# Patient Record
Sex: Female | Born: 1945 | Race: White | Hispanic: No | Marital: Married | Smoking: Never smoker
Health system: Southern US, Community
[De-identification: ages and names within clinical notes are randomized; demographics above are authoritative.]

## PROBLEM LIST (undated history)

## (undated) DIAGNOSIS — E079 Disorder of thyroid, unspecified: Secondary | ICD-10-CM

## (undated) DIAGNOSIS — Z87440 Personal history of urinary (tract) infections: Secondary | ICD-10-CM

## (undated) DIAGNOSIS — C801 Malignant (primary) neoplasm, unspecified: Secondary | ICD-10-CM

## (undated) DIAGNOSIS — IMO0002 Reserved for concepts with insufficient information to code with codable children: Secondary | ICD-10-CM

## (undated) DIAGNOSIS — G500 Trigeminal neuralgia: Secondary | ICD-10-CM

## (undated) DIAGNOSIS — Z9221 Personal history of antineoplastic chemotherapy: Secondary | ICD-10-CM

## (undated) DIAGNOSIS — J329 Chronic sinusitis, unspecified: Secondary | ICD-10-CM

## (undated) DIAGNOSIS — E785 Hyperlipidemia, unspecified: Secondary | ICD-10-CM

## (undated) DIAGNOSIS — C50919 Malignant neoplasm of unspecified site of unspecified female breast: Secondary | ICD-10-CM

## (undated) HISTORY — DX: Personal history of urinary (tract) infections: Z87.440

## (undated) HISTORY — DX: Chronic sinusitis, unspecified: J32.9

## (undated) HISTORY — DX: Disorder of thyroid, unspecified: E07.9

## (undated) HISTORY — DX: Trigeminal neuralgia: G50.0

## (undated) HISTORY — DX: Malignant (primary) neoplasm, unspecified: C80.1

## (undated) HISTORY — DX: Reserved for concepts with insufficient information to code with codable children: IMO0002

## (undated) HISTORY — DX: Hyperlipidemia, unspecified: E78.5

## (undated) HISTORY — DX: Malignant neoplasm of unspecified site of unspecified female breast: C50.919

---

## 1997-08-29 DIAGNOSIS — C50919 Malignant neoplasm of unspecified site of unspecified female breast: Secondary | ICD-10-CM

## 1997-08-29 HISTORY — PX: BREAST SURGERY: SHX581

## 1997-08-29 HISTORY — DX: Malignant neoplasm of unspecified site of unspecified female breast: C50.919

## 1997-08-29 HISTORY — PX: BREAST LUMPECTOMY: SHX2

## 1997-08-29 HISTORY — PX: OTHER SURGICAL HISTORY: SHX169

## 1998-01-22 ENCOUNTER — Ambulatory Visit (HOSPITAL_COMMUNITY): Admission: RE | Admit: 1998-01-22 | Discharge: 1998-01-22 | Payer: Self-pay | Admitting: General Surgery

## 1998-01-28 ENCOUNTER — Ambulatory Visit (HOSPITAL_COMMUNITY): Admission: RE | Admit: 1998-01-28 | Discharge: 1998-01-29 | Payer: Self-pay | Admitting: General Surgery

## 1998-01-30 ENCOUNTER — Encounter: Admission: RE | Admit: 1998-01-30 | Discharge: 1998-04-30 | Payer: Self-pay | Admitting: Radiation Oncology

## 1999-04-28 ENCOUNTER — Encounter (INDEPENDENT_AMBULATORY_CARE_PROVIDER_SITE_OTHER): Payer: Self-pay

## 1999-04-28 ENCOUNTER — Other Ambulatory Visit: Admission: RE | Admit: 1999-04-28 | Discharge: 1999-04-28 | Payer: Self-pay | Admitting: Obstetrics and Gynecology

## 2000-02-04 ENCOUNTER — Encounter: Admission: RE | Admit: 2000-02-04 | Discharge: 2000-02-04 | Payer: Self-pay | Admitting: Oncology

## 2000-02-04 ENCOUNTER — Encounter: Payer: Self-pay | Admitting: Oncology

## 2001-02-05 ENCOUNTER — Encounter: Payer: Self-pay | Admitting: Oncology

## 2001-02-05 ENCOUNTER — Encounter: Admission: RE | Admit: 2001-02-05 | Discharge: 2001-02-05 | Payer: Self-pay | Admitting: Oncology

## 2002-02-08 ENCOUNTER — Encounter: Admission: RE | Admit: 2002-02-08 | Discharge: 2002-02-08 | Payer: Self-pay | Admitting: Oncology

## 2002-02-08 ENCOUNTER — Encounter: Payer: Self-pay | Admitting: Oncology

## 2002-04-26 ENCOUNTER — Encounter: Payer: Self-pay | Admitting: Internal Medicine

## 2002-04-26 ENCOUNTER — Encounter: Admission: RE | Admit: 2002-04-26 | Discharge: 2002-04-26 | Payer: Self-pay | Admitting: Internal Medicine

## 2003-02-11 ENCOUNTER — Encounter: Payer: Self-pay | Admitting: Oncology

## 2003-02-11 ENCOUNTER — Encounter: Admission: RE | Admit: 2003-02-11 | Discharge: 2003-02-11 | Payer: Self-pay | Admitting: Oncology

## 2003-08-30 HISTORY — PX: BLADDER SUSPENSION: SHX72

## 2003-08-30 HISTORY — PX: ABDOMINAL HYSTERECTOMY: SHX81

## 2004-02-16 ENCOUNTER — Encounter: Admission: RE | Admit: 2004-02-16 | Discharge: 2004-02-16 | Payer: Self-pay | Admitting: Oncology

## 2004-04-21 ENCOUNTER — Encounter (INDEPENDENT_AMBULATORY_CARE_PROVIDER_SITE_OTHER): Payer: Self-pay | Admitting: *Deleted

## 2004-04-22 ENCOUNTER — Inpatient Hospital Stay (HOSPITAL_COMMUNITY): Admission: RE | Admit: 2004-04-22 | Discharge: 2004-04-24 | Payer: Self-pay | Admitting: Obstetrics and Gynecology

## 2005-02-02 ENCOUNTER — Ambulatory Visit: Payer: Self-pay | Admitting: Oncology

## 2005-02-18 ENCOUNTER — Encounter: Admission: RE | Admit: 2005-02-18 | Discharge: 2005-02-18 | Payer: Self-pay | Admitting: Internal Medicine

## 2005-03-22 ENCOUNTER — Ambulatory Visit (HOSPITAL_COMMUNITY): Admission: RE | Admit: 2005-03-22 | Discharge: 2005-03-22 | Payer: Self-pay | Admitting: Gastroenterology

## 2005-03-22 ENCOUNTER — Encounter (INDEPENDENT_AMBULATORY_CARE_PROVIDER_SITE_OTHER): Payer: Self-pay | Admitting: *Deleted

## 2005-04-05 ENCOUNTER — Ambulatory Visit: Payer: Self-pay | Admitting: Oncology

## 2005-08-29 HISTORY — PX: BREAST SURGERY: SHX581

## 2006-02-20 ENCOUNTER — Encounter: Admission: RE | Admit: 2006-02-20 | Discharge: 2006-02-20 | Payer: Self-pay | Admitting: Obstetrics and Gynecology

## 2006-03-29 DIAGNOSIS — C801 Malignant (primary) neoplasm, unspecified: Secondary | ICD-10-CM

## 2006-03-29 HISTORY — DX: Malignant (primary) neoplasm, unspecified: C80.1

## 2006-04-03 ENCOUNTER — Ambulatory Visit: Payer: Self-pay | Admitting: Oncology

## 2006-04-05 ENCOUNTER — Ambulatory Visit (HOSPITAL_COMMUNITY): Admission: RE | Admit: 2006-04-05 | Discharge: 2006-04-05 | Payer: Self-pay | Admitting: Oncology

## 2006-04-05 LAB — CBC WITH DIFFERENTIAL/PLATELET
BASO%: 0.5 % (ref 0.0–2.0)
Basophils Absolute: 0 10*3/uL (ref 0.0–0.1)
EOS%: 2.1 % (ref 0.0–7.0)
HGB: 12.9 g/dL (ref 11.6–15.9)
MCH: 30.8 pg (ref 26.0–34.0)
MCHC: 34.1 g/dL (ref 32.0–36.0)
MCV: 90.3 fL (ref 81.0–101.0)
MONO%: 5.3 % (ref 0.0–13.0)
RBC: 4.18 10*6/uL (ref 3.70–5.32)
RDW: 12.8 % (ref 11.3–14.5)
lymph#: 1.5 10*3/uL (ref 0.9–3.3)

## 2006-04-05 LAB — COMPREHENSIVE METABOLIC PANEL
ALT: 15 U/L (ref 0–40)
AST: 18 U/L (ref 0–37)
Albumin: 4.3 g/dL (ref 3.5–5.2)
Alkaline Phosphatase: 41 U/L (ref 39–117)
BUN: 12 mg/dL (ref 6–23)
Calcium: 8.8 mg/dL (ref 8.4–10.5)
Chloride: 104 mEq/L (ref 96–112)
Potassium: 4.1 mEq/L (ref 3.5–5.3)

## 2006-04-25 ENCOUNTER — Encounter: Admission: RE | Admit: 2006-04-25 | Discharge: 2006-04-25 | Payer: Self-pay | Admitting: Oncology

## 2006-05-16 ENCOUNTER — Ambulatory Visit: Payer: Self-pay | Admitting: Oncology

## 2006-05-17 ENCOUNTER — Ambulatory Visit (HOSPITAL_COMMUNITY): Admission: RE | Admit: 2006-05-17 | Discharge: 2006-05-17 | Payer: Self-pay | Admitting: Oncology

## 2006-05-17 LAB — COMPREHENSIVE METABOLIC PANEL
ALT: 18 U/L (ref 0–40)
AST: 17 U/L (ref 0–37)
Alkaline Phosphatase: 36 U/L — ABNORMAL LOW (ref 39–117)
BUN: 19 mg/dL (ref 6–23)
Creatinine, Ser: 0.73 mg/dL (ref 0.40–1.20)

## 2006-05-17 LAB — CBC WITH DIFFERENTIAL/PLATELET
BASO%: 0.2 % (ref 0.0–2.0)
Basophils Absolute: 0 10*3/uL (ref 0.0–0.1)
EOS%: 0.5 % (ref 0.0–7.0)
HCT: 40.3 % (ref 34.8–46.6)
HGB: 13.7 g/dL (ref 11.6–15.9)
MCH: 30.5 pg (ref 26.0–34.0)
MCHC: 34 g/dL (ref 32.0–36.0)
MCV: 89.9 fL (ref 81.0–101.0)
MONO%: 4.6 % (ref 0.0–13.0)
NEUT%: 61 % (ref 39.6–76.8)
RDW: 13 % (ref 11.3–14.5)

## 2006-05-23 ENCOUNTER — Encounter (INDEPENDENT_AMBULATORY_CARE_PROVIDER_SITE_OTHER): Payer: Self-pay | Admitting: Interventional Radiology

## 2006-05-23 ENCOUNTER — Encounter (INDEPENDENT_AMBULATORY_CARE_PROVIDER_SITE_OTHER): Payer: Self-pay | Admitting: *Deleted

## 2006-05-23 ENCOUNTER — Ambulatory Visit (HOSPITAL_COMMUNITY): Admission: RE | Admit: 2006-05-23 | Discharge: 2006-05-23 | Payer: Self-pay | Admitting: Oncology

## 2006-06-02 ENCOUNTER — Encounter: Admission: RE | Admit: 2006-06-02 | Discharge: 2006-06-02 | Payer: Self-pay | Admitting: Oncology

## 2006-06-02 ENCOUNTER — Encounter: Payer: Self-pay | Admitting: Family Medicine

## 2006-06-07 LAB — COMPREHENSIVE METABOLIC PANEL
ALT: 15 U/L (ref 0–40)
AST: 13 U/L (ref 0–37)
Albumin: 5.1 g/dL (ref 3.5–5.2)
Calcium: 10.7 mg/dL — ABNORMAL HIGH (ref 8.4–10.5)
Chloride: 98 mEq/L (ref 96–112)
Potassium: 4.3 mEq/L (ref 3.5–5.3)
Sodium: 139 mEq/L (ref 135–145)

## 2006-06-07 LAB — CBC WITH DIFFERENTIAL/PLATELET
BASO%: 0.2 % (ref 0.0–2.0)
EOS%: 0.7 % (ref 0.0–7.0)
HGB: 14.8 g/dL (ref 11.6–15.9)
MCH: 30.9 pg (ref 26.0–34.0)
MCHC: 34.3 g/dL (ref 32.0–36.0)
RBC: 4.78 10*6/uL (ref 3.70–5.32)
RDW: 13.4 % (ref 11.3–14.5)
lymph#: 1.8 10*3/uL (ref 0.9–3.3)

## 2006-06-15 LAB — CBC WITH DIFFERENTIAL/PLATELET
BASO%: 0.3 % (ref 0.0–2.0)
Basophils Absolute: 0 10*3/uL (ref 0.0–0.1)
HCT: 39.9 % (ref 34.8–46.6)
HGB: 13.6 g/dL (ref 11.6–15.9)
LYMPH%: 29.4 % (ref 14.0–48.0)
MCH: 30.9 pg (ref 26.0–34.0)
MCHC: 34 g/dL (ref 32.0–36.0)
MONO#: 0.1 10*3/uL (ref 0.1–0.9)
NEUT%: 65 % (ref 39.6–76.8)
Platelets: 180 10*3/uL (ref 145–400)
WBC: 4.6 10*3/uL (ref 3.9–10.0)
lymph#: 1.3 10*3/uL (ref 0.9–3.3)

## 2006-06-22 LAB — CBC WITH DIFFERENTIAL/PLATELET
BASO%: 0.8 % (ref 0.0–2.0)
Basophils Absolute: 0.1 10*3/uL (ref 0.0–0.1)
EOS%: 0.4 % (ref 0.0–7.0)
HCT: 36.1 % (ref 34.8–46.6)
HGB: 12.7 g/dL (ref 11.6–15.9)
LYMPH%: 25.4 % (ref 14.0–48.0)
MCH: 30.7 pg (ref 26.0–34.0)
MCHC: 35.1 g/dL (ref 32.0–36.0)
MCV: 87.6 fL (ref 81.0–101.0)
MONO%: 3.3 % (ref 0.0–13.0)
NEUT%: 70 % (ref 39.6–76.8)
lymph#: 1.8 10*3/uL (ref 0.9–3.3)

## 2006-06-27 ENCOUNTER — Ambulatory Visit: Payer: Self-pay | Admitting: Oncology

## 2006-06-29 LAB — CBC WITH DIFFERENTIAL/PLATELET
Basophils Absolute: 0 10*3/uL (ref 0.0–0.1)
EOS%: 0.3 % (ref 0.0–7.0)
HCT: 35.8 % (ref 34.8–46.6)
HGB: 12.2 g/dL (ref 11.6–15.9)
LYMPH%: 36.6 % (ref 14.0–48.0)
MCH: 31.3 pg (ref 26.0–34.0)
MCHC: 34 g/dL (ref 32.0–36.0)
MONO#: 0.4 10*3/uL (ref 0.1–0.9)
NEUT%: 50.8 % (ref 39.6–76.8)
Platelets: 292 10*3/uL (ref 145–400)
lymph#: 1.2 10*3/uL (ref 0.9–3.3)

## 2006-06-29 LAB — COMPREHENSIVE METABOLIC PANEL
BUN: 11 mg/dL (ref 6–23)
CO2: 31 mEq/L (ref 19–32)
Calcium: 8.8 mg/dL (ref 8.4–10.5)
Chloride: 104 mEq/L (ref 96–112)
Creatinine, Ser: 0.63 mg/dL (ref 0.40–1.20)
Total Bilirubin: 0.5 mg/dL (ref 0.3–1.2)

## 2006-07-11 LAB — CBC WITH DIFFERENTIAL/PLATELET
BASO%: 0.4 % (ref 0.0–2.0)
Basophils Absolute: 0.1 10*3/uL (ref 0.0–0.1)
Eosinophils Absolute: 0 10*3/uL (ref 0.0–0.5)
HCT: 34.4 % — ABNORMAL LOW (ref 34.8–46.6)
HGB: 12.1 g/dL (ref 11.6–15.9)
MONO#: 0.3 10*3/uL (ref 0.1–0.9)
NEUT%: 82.7 % — ABNORMAL HIGH (ref 39.6–76.8)
Platelets: 177 10*3/uL (ref 145–400)
WBC: 12.3 10*3/uL — ABNORMAL HIGH (ref 3.9–10.0)
lymph#: 1.8 10*3/uL (ref 0.9–3.3)

## 2006-07-12 LAB — URINALYSIS, MICROSCOPIC - CHCC
Bilirubin (Urine): NEGATIVE
Glucose: NEGATIVE g/dL
Ketones: NEGATIVE mg/dL
Protein: NEGATIVE mg/dL
RBC count: NEGATIVE (ref 0–2)

## 2006-07-14 ENCOUNTER — Ambulatory Visit (HOSPITAL_COMMUNITY): Admission: RE | Admit: 2006-07-14 | Discharge: 2006-07-14 | Payer: Self-pay | Admitting: Oncology

## 2006-07-14 LAB — URINE CULTURE

## 2006-07-19 LAB — CBC WITH DIFFERENTIAL/PLATELET
BASO%: 0.3 % (ref 0.0–2.0)
EOS%: 0.6 % (ref 0.0–7.0)
HCT: 33.2 % — ABNORMAL LOW (ref 34.8–46.6)
LYMPH%: 30.8 % (ref 14.0–48.0)
MCH: 32.1 pg (ref 26.0–34.0)
MCHC: 34.3 g/dL (ref 32.0–36.0)
NEUT%: 57.9 % (ref 39.6–76.8)
lymph#: 0.9 10*3/uL (ref 0.9–3.3)

## 2006-08-29 HISTORY — PX: OTHER SURGICAL HISTORY: SHX169

## 2006-09-04 ENCOUNTER — Encounter (INDEPENDENT_AMBULATORY_CARE_PROVIDER_SITE_OTHER): Payer: Self-pay | Admitting: *Deleted

## 2006-09-04 ENCOUNTER — Inpatient Hospital Stay (HOSPITAL_COMMUNITY): Admission: RE | Admit: 2006-09-04 | Discharge: 2006-09-08 | Payer: Self-pay | Admitting: Thoracic Surgery

## 2006-09-04 ENCOUNTER — Encounter: Payer: Self-pay | Admitting: Thoracic Surgery

## 2006-09-11 ENCOUNTER — Ambulatory Visit: Payer: Self-pay | Admitting: Oncology

## 2006-09-13 ENCOUNTER — Encounter: Admission: RE | Admit: 2006-09-13 | Discharge: 2006-09-13 | Payer: Self-pay | Admitting: Thoracic Surgery

## 2006-09-14 LAB — CBC WITH DIFFERENTIAL/PLATELET
BASO%: 0.6 % (ref 0.0–2.0)
EOS%: 11.8 % — ABNORMAL HIGH (ref 0.0–7.0)
LYMPH%: 36.7 % (ref 14.0–48.0)
MCH: 31.8 pg (ref 26.0–34.0)
MCHC: 33.6 g/dL (ref 32.0–36.0)
MONO#: 0.3 10*3/uL (ref 0.1–0.9)
MONO%: 6.8 % (ref 0.0–13.0)
Platelets: 390 10*3/uL (ref 145–400)
RBC: 4.1 10*6/uL (ref 3.70–5.32)
WBC: 4.5 10*3/uL (ref 3.9–10.0)

## 2006-09-14 LAB — COMPREHENSIVE METABOLIC PANEL
ALT: 24 U/L (ref 0–35)
AST: 21 U/L (ref 0–37)
Alkaline Phosphatase: 53 U/L (ref 39–117)
CO2: 27 mEq/L (ref 19–32)
Sodium: 144 mEq/L (ref 135–145)
Total Bilirubin: 0.3 mg/dL (ref 0.3–1.2)
Total Protein: 6.8 g/dL (ref 6.0–8.3)

## 2006-09-29 LAB — CBC WITH DIFFERENTIAL/PLATELET
BASO%: 0.9 % (ref 0.0–2.0)
HCT: 40.1 % (ref 34.8–46.6)
LYMPH%: 35.5 % (ref 14.0–48.0)
MCHC: 34.8 g/dL (ref 32.0–36.0)
MCV: 92.7 fL (ref 81.0–101.0)
MONO%: 7.6 % (ref 0.0–13.0)
NEUT%: 46.5 % (ref 39.6–76.8)
Platelets: 214 10*3/uL (ref 145–400)
RBC: 4.32 10*6/uL (ref 3.70–5.32)

## 2006-09-29 LAB — URINALYSIS, MICROSCOPIC - CHCC
Bilirubin (Urine): NEGATIVE
Blood: NEGATIVE
Nitrite: NEGATIVE
Specific Gravity, Urine: 1.02 (ref 1.003–1.035)
pH: 6 (ref 4.6–8.0)

## 2006-10-01 LAB — COMPREHENSIVE METABOLIC PANEL
AST: 33 U/L (ref 0–37)
Albumin: 4.7 g/dL (ref 3.5–5.2)
BUN: 15 mg/dL (ref 6–23)
Calcium: 9.1 mg/dL (ref 8.4–10.5)
Chloride: 105 mEq/L (ref 96–112)
Potassium: 5 mEq/L (ref 3.5–5.3)
Sodium: 140 mEq/L (ref 135–145)
Total Protein: 6.7 g/dL (ref 6.0–8.3)

## 2006-10-18 ENCOUNTER — Encounter: Admission: RE | Admit: 2006-10-18 | Discharge: 2006-10-18 | Payer: Self-pay | Admitting: Thoracic Surgery

## 2006-10-18 ENCOUNTER — Ambulatory Visit: Payer: Self-pay | Admitting: Thoracic Surgery

## 2006-10-20 LAB — CBC WITH DIFFERENTIAL/PLATELET
BASO%: 0.9 % (ref 0.0–2.0)
EOS%: 4.1 % (ref 0.0–7.0)
Eosinophils Absolute: 0.1 10*3/uL (ref 0.0–0.5)
LYMPH%: 46.9 % (ref 14.0–48.0)
MCH: 32 pg (ref 26.0–34.0)
MCHC: 34.2 g/dL (ref 32.0–36.0)
MCV: 93.6 fL (ref 81.0–101.0)
MONO%: 7.3 % (ref 0.0–13.0)
NEUT#: 1.4 10*3/uL — ABNORMAL LOW (ref 1.5–6.5)
Platelets: 243 10*3/uL (ref 145–400)
RBC: 4.29 10*6/uL (ref 3.70–5.32)
RDW: 14.8 % — ABNORMAL HIGH (ref 11.3–14.5)

## 2006-10-20 LAB — COMPREHENSIVE METABOLIC PANEL
ALT: 15 U/L (ref 0–35)
AST: 19 U/L (ref 0–37)
Albumin: 4.7 g/dL (ref 3.5–5.2)
Alkaline Phosphatase: 62 U/L (ref 39–117)
Potassium: 4.2 mEq/L (ref 3.5–5.3)
Sodium: 139 mEq/L (ref 135–145)
Total Bilirubin: 0.3 mg/dL (ref 0.3–1.2)
Total Protein: 6.7 g/dL (ref 6.0–8.3)

## 2006-10-24 ENCOUNTER — Ambulatory Visit: Payer: Self-pay | Admitting: Oncology

## 2006-11-03 LAB — CBC WITH DIFFERENTIAL/PLATELET
Basophils Absolute: 0 10*3/uL (ref 0.0–0.1)
EOS%: 0 % (ref 0.0–7.0)
Eosinophils Absolute: 0 10*3/uL (ref 0.0–0.5)
LYMPH%: 9 % — ABNORMAL LOW (ref 14.0–48.0)
MCH: 31.7 pg (ref 26.0–34.0)
MCV: 93.3 fL (ref 81.0–101.0)
MONO%: 5.9 % (ref 0.0–13.0)
NEUT#: 10.5 10*3/uL — ABNORMAL HIGH (ref 1.5–6.5)
Platelets: 292 10*3/uL (ref 145–400)
RBC: 4.16 10*6/uL (ref 3.70–5.32)
RDW: 12.6 % (ref 11.3–14.5)

## 2006-11-08 LAB — CBC WITH DIFFERENTIAL/PLATELET
Basophils Absolute: 0 10*3/uL (ref 0.0–0.1)
Eosinophils Absolute: 0.2 10*3/uL (ref 0.0–0.5)
HGB: 14.4 g/dL (ref 11.6–15.9)
MCV: 92.4 fL (ref 81.0–101.0)
NEUT#: 3 10*3/uL (ref 1.5–6.5)
RDW: 14.8 % — ABNORMAL HIGH (ref 11.3–14.5)
lymph#: 2.1 10*3/uL (ref 0.9–3.3)

## 2006-11-08 LAB — COMPREHENSIVE METABOLIC PANEL
Albumin: 4.2 g/dL (ref 3.5–5.2)
BUN: 15 mg/dL (ref 6–23)
CO2: 30 mEq/L (ref 19–32)
Calcium: 9.4 mg/dL (ref 8.4–10.5)
Chloride: 98 mEq/L (ref 96–112)
Glucose, Bld: 83 mg/dL (ref 70–99)
Potassium: 4.6 mEq/L (ref 3.5–5.3)

## 2006-11-10 LAB — CBC WITH DIFFERENTIAL/PLATELET
Basophils Absolute: 0 10*3/uL (ref 0.0–0.1)
EOS%: 0.6 % (ref 0.0–7.0)
Eosinophils Absolute: 0.1 10*3/uL (ref 0.0–0.5)
HGB: 12.6 g/dL (ref 11.6–15.9)
NEUT#: 6.8 10*3/uL — ABNORMAL HIGH (ref 1.5–6.5)
RDW: 14.9 % — ABNORMAL HIGH (ref 11.3–14.5)
lymph#: 2 10*3/uL (ref 0.9–3.3)

## 2006-11-13 ENCOUNTER — Encounter: Admission: RE | Admit: 2006-11-13 | Discharge: 2006-11-13 | Payer: Self-pay | Admitting: Oncology

## 2006-11-22 ENCOUNTER — Encounter: Admission: RE | Admit: 2006-11-22 | Discharge: 2006-11-22 | Payer: Self-pay | Admitting: Oncology

## 2006-11-23 LAB — CBC WITH DIFFERENTIAL/PLATELET
Basophils Absolute: 0 10*3/uL (ref 0.0–0.1)
Eosinophils Absolute: 0 10*3/uL (ref 0.0–0.5)
HGB: 12.7 g/dL (ref 11.6–15.9)
MCV: 92.5 fL (ref 81.0–101.0)
MONO#: 0.3 10*3/uL (ref 0.1–0.9)
MONO%: 7.7 % (ref 0.0–13.0)
NEUT#: 2.3 10*3/uL (ref 1.5–6.5)
Platelets: 318 10*3/uL (ref 145–400)
RDW: 16.4 % — ABNORMAL HIGH (ref 11.3–14.5)

## 2006-11-23 LAB — COMPREHENSIVE METABOLIC PANEL
Albumin: 4.6 g/dL (ref 3.5–5.2)
Alkaline Phosphatase: 76 U/L (ref 39–117)
BUN: 13 mg/dL (ref 6–23)
CO2: 28 mEq/L (ref 19–32)
Calcium: 9.6 mg/dL (ref 8.4–10.5)
Chloride: 102 mEq/L (ref 96–112)
Glucose, Bld: 82 mg/dL (ref 70–99)
Potassium: 4.2 mEq/L (ref 3.5–5.3)

## 2006-12-12 ENCOUNTER — Ambulatory Visit: Payer: Self-pay | Admitting: Oncology

## 2006-12-13 ENCOUNTER — Ambulatory Visit (HOSPITAL_COMMUNITY): Admission: RE | Admit: 2006-12-13 | Discharge: 2006-12-13 | Payer: Self-pay | Admitting: Oncology

## 2006-12-15 LAB — COMPREHENSIVE METABOLIC PANEL
ALT: 12 U/L (ref 0–35)
AST: 15 U/L (ref 0–37)
BUN: 13 mg/dL (ref 6–23)
CO2: 27 mEq/L (ref 19–32)
Creatinine, Ser: 0.65 mg/dL (ref 0.40–1.20)
Total Bilirubin: 0.5 mg/dL (ref 0.3–1.2)

## 2006-12-15 LAB — CBC WITH DIFFERENTIAL/PLATELET
BASO%: 0.7 % (ref 0.0–2.0)
Basophils Absolute: 0 10*3/uL (ref 0.0–0.1)
EOS%: 0.2 % (ref 0.0–7.0)
HCT: 34.3 % — ABNORMAL LOW (ref 34.8–46.6)
LYMPH%: 28.5 % (ref 14.0–48.0)
MCH: 32.8 pg (ref 26.0–34.0)
MCHC: 34.7 g/dL (ref 32.0–36.0)
MCV: 94.4 fL (ref 81.0–101.0)
NEUT%: 62.6 % (ref 39.6–76.8)
Platelets: 263 10*3/uL (ref 145–400)

## 2006-12-15 LAB — CANCER ANTIGEN 27.29: CA 27.29: 50 U/mL — ABNORMAL HIGH (ref 0–39)

## 2007-01-19 LAB — COMPREHENSIVE METABOLIC PANEL
ALT: 13 U/L (ref 0–35)
AST: 16 U/L (ref 0–37)
Albumin: 4.7 g/dL (ref 3.5–5.2)
Alkaline Phosphatase: 65 U/L (ref 39–117)
Potassium: 4.3 mEq/L (ref 3.5–5.3)
Sodium: 142 mEq/L (ref 135–145)
Total Bilirubin: 0.4 mg/dL (ref 0.3–1.2)
Total Protein: 6.5 g/dL (ref 6.0–8.3)

## 2007-01-19 LAB — CBC WITH DIFFERENTIAL/PLATELET
Basophils Absolute: 0 10*3/uL (ref 0.0–0.1)
EOS%: 4 % (ref 0.0–7.0)
Eosinophils Absolute: 0.1 10*3/uL (ref 0.0–0.5)
HCT: 37.7 % (ref 34.8–46.6)
HGB: 13.2 g/dL (ref 11.6–15.9)
MCH: 32.7 pg (ref 26.0–34.0)
NEUT#: 1.5 10*3/uL (ref 1.5–6.5)
NEUT%: 47.7 % (ref 39.6–76.8)
RDW: 12.8 % (ref 11.3–14.5)
lymph#: 1.3 10*3/uL (ref 0.9–3.3)

## 2007-01-30 ENCOUNTER — Ambulatory Visit (HOSPITAL_BASED_OUTPATIENT_CLINIC_OR_DEPARTMENT_OTHER): Admission: RE | Admit: 2007-01-30 | Discharge: 2007-01-30 | Payer: Self-pay | Admitting: Urology

## 2007-02-22 ENCOUNTER — Encounter: Admission: RE | Admit: 2007-02-22 | Discharge: 2007-02-22 | Payer: Self-pay | Admitting: Oncology

## 2007-02-27 ENCOUNTER — Encounter: Admission: RE | Admit: 2007-02-27 | Discharge: 2007-02-27 | Payer: Self-pay | Admitting: Oncology

## 2007-02-28 ENCOUNTER — Ambulatory Visit: Payer: Self-pay | Admitting: Oncology

## 2007-02-28 LAB — CBC WITH DIFFERENTIAL/PLATELET
BASO%: 0.9 % (ref 0.0–2.0)
HCT: 39.9 % (ref 34.8–46.6)
LYMPH%: 49.3 % — ABNORMAL HIGH (ref 14.0–48.0)
MCH: 31.5 pg (ref 26.0–34.0)
MCHC: 35.3 g/dL (ref 32.0–36.0)
MCV: 89.1 fL (ref 81.0–101.0)
MONO#: 0.2 10*3/uL (ref 0.1–0.9)
MONO%: 5.7 % (ref 0.0–13.0)
NEUT%: 42.2 % (ref 39.6–76.8)
Platelets: 214 10*3/uL (ref 145–400)
WBC: 3.3 10*3/uL — ABNORMAL LOW (ref 3.9–10.0)

## 2007-04-04 ENCOUNTER — Ambulatory Visit: Payer: Self-pay | Admitting: Oncology

## 2007-04-04 LAB — CBC WITH DIFFERENTIAL/PLATELET
BASO%: 0.4 % (ref 0.0–2.0)
EOS%: 2.1 % (ref 0.0–7.0)
HCT: 39 % (ref 34.8–46.6)
LYMPH%: 42.8 % (ref 14.0–48.0)
MCH: 31.9 pg (ref 26.0–34.0)
MCHC: 35.3 g/dL (ref 32.0–36.0)
NEUT%: 47 % (ref 39.6–76.8)
RBC: 4.32 10*6/uL (ref 3.70–5.32)
lymph#: 1.3 10*3/uL (ref 0.9–3.3)

## 2007-04-04 LAB — COMPREHENSIVE METABOLIC PANEL
ALT: 15 U/L (ref 0–35)
AST: 20 U/L (ref 0–37)
Chloride: 105 mEq/L (ref 96–112)
Creatinine, Ser: 0.66 mg/dL (ref 0.40–1.20)
Total Bilirubin: 0.4 mg/dL (ref 0.3–1.2)

## 2007-04-04 LAB — CANCER ANTIGEN 27.29: CA 27.29: 22 U/mL (ref 0–39)

## 2007-06-22 ENCOUNTER — Ambulatory Visit: Payer: Self-pay | Admitting: Oncology

## 2007-06-26 LAB — CBC WITH DIFFERENTIAL/PLATELET
BASO%: 0.4 % (ref 0.0–2.0)
EOS%: 1.8 % (ref 0.0–7.0)
HGB: 13.5 g/dL (ref 11.6–15.9)
LYMPH%: 40.2 % (ref 14.0–48.0)
MCV: 91.8 fL (ref 81.0–101.0)
NEUT#: 1.9 10*3/uL (ref 1.5–6.5)
NEUT%: 50.9 % (ref 39.6–76.8)
RBC: 4.24 10*6/uL (ref 3.70–5.32)
RDW: 12.3 % (ref 11.3–14.5)
WBC: 3.7 10*3/uL — ABNORMAL LOW (ref 3.9–10.0)
lymph#: 1.5 10*3/uL (ref 0.9–3.3)

## 2007-06-26 LAB — CANCER ANTIGEN 27.29: CA 27.29: 27 U/mL (ref 0–39)

## 2007-06-26 LAB — COMPREHENSIVE METABOLIC PANEL
ALT: 11 U/L (ref 0–35)
CO2: 27 mEq/L (ref 19–32)
Creatinine, Ser: 0.69 mg/dL (ref 0.40–1.20)
Total Bilirubin: 0.3 mg/dL (ref 0.3–1.2)

## 2007-06-27 ENCOUNTER — Ambulatory Visit (HOSPITAL_COMMUNITY): Admission: RE | Admit: 2007-06-27 | Discharge: 2007-06-27 | Payer: Self-pay | Admitting: Oncology

## 2007-09-05 LAB — CONVERTED CEMR LAB
Cholesterol: 305 mg/dL
Triglycerides: 263 mg/dL

## 2007-10-29 ENCOUNTER — Ambulatory Visit: Payer: Self-pay | Admitting: Oncology

## 2007-10-31 LAB — CBC WITH DIFFERENTIAL/PLATELET
BASO%: 0.6 % (ref 0.0–2.0)
Basophils Absolute: 0 10*3/uL (ref 0.0–0.1)
EOS%: 2.9 % (ref 0.0–7.0)
HCT: 36.8 % (ref 34.8–46.6)
HGB: 12.6 g/dL (ref 11.6–15.9)
LYMPH%: 44.3 % (ref 14.0–48.0)
MCH: 31.5 pg (ref 26.0–34.0)
MCHC: 34.3 g/dL (ref 32.0–36.0)
MCV: 91.6 fL (ref 81.0–101.0)
MONO%: 6.6 % (ref 0.0–13.0)
NEUT%: 45.6 % (ref 39.6–76.8)
Platelets: 202 10*3/uL (ref 145–400)
lymph#: 1.4 10*3/uL (ref 0.9–3.3)

## 2007-10-31 LAB — COMPREHENSIVE METABOLIC PANEL
ALT: 10 U/L (ref 0–35)
AST: 15 U/L (ref 0–37)
BUN: 19 mg/dL (ref 6–23)
Calcium: 9.5 mg/dL (ref 8.4–10.5)
Chloride: 103 mEq/L (ref 96–112)
Creatinine, Ser: 0.67 mg/dL (ref 0.40–1.20)
Total Bilirubin: 0.4 mg/dL (ref 0.3–1.2)

## 2007-12-20 ENCOUNTER — Ambulatory Visit: Payer: Self-pay | Admitting: Family Medicine

## 2007-12-20 DIAGNOSIS — G500 Trigeminal neuralgia: Secondary | ICD-10-CM | POA: Insufficient documentation

## 2007-12-20 DIAGNOSIS — M810 Age-related osteoporosis without current pathological fracture: Secondary | ICD-10-CM | POA: Insufficient documentation

## 2007-12-20 DIAGNOSIS — E782 Mixed hyperlipidemia: Secondary | ICD-10-CM | POA: Insufficient documentation

## 2007-12-20 DIAGNOSIS — H409 Unspecified glaucoma: Secondary | ICD-10-CM | POA: Insufficient documentation

## 2007-12-20 DIAGNOSIS — M5136 Other intervertebral disc degeneration, lumbar region: Secondary | ICD-10-CM | POA: Insufficient documentation

## 2007-12-21 ENCOUNTER — Encounter: Payer: Self-pay | Admitting: Family Medicine

## 2007-12-23 LAB — CONVERTED CEMR LAB
Cholesterol: 259 mg/dL — ABNORMAL HIGH (ref 0–200)
Total CHOL/HDL Ratio: 3.7

## 2008-01-18 ENCOUNTER — Ambulatory Visit: Payer: Self-pay | Admitting: Oncology

## 2008-01-23 ENCOUNTER — Encounter: Payer: Self-pay | Admitting: Family Medicine

## 2008-02-25 ENCOUNTER — Encounter: Admission: RE | Admit: 2008-02-25 | Discharge: 2008-02-25 | Payer: Self-pay | Admitting: Oncology

## 2008-02-25 ENCOUNTER — Encounter: Payer: Self-pay | Admitting: Family Medicine

## 2008-03-12 ENCOUNTER — Ambulatory Visit: Payer: Self-pay | Admitting: Family Medicine

## 2008-03-13 LAB — CONVERTED CEMR LAB
Albumin: 4.9 g/dL (ref 3.5–5.2)
Indirect Bilirubin: 0.3 mg/dL (ref 0.0–0.9)
LDL Cholesterol: 113 mg/dL — ABNORMAL HIGH (ref 0–99)
Total Protein: 6.9 g/dL (ref 6.0–8.3)
Triglycerides: 209 mg/dL — ABNORMAL HIGH (ref <150)
VLDL: 42 mg/dL — ABNORMAL HIGH (ref 0–40)

## 2008-03-14 ENCOUNTER — Telehealth: Payer: Self-pay | Admitting: Family Medicine

## 2008-04-04 ENCOUNTER — Ambulatory Visit: Payer: Self-pay | Admitting: Family Medicine

## 2008-04-07 ENCOUNTER — Telehealth: Payer: Self-pay | Admitting: Family Medicine

## 2008-04-08 ENCOUNTER — Encounter: Admission: RE | Admit: 2008-04-08 | Discharge: 2008-04-08 | Payer: Self-pay | Admitting: Family Medicine

## 2008-04-13 ENCOUNTER — Ambulatory Visit: Payer: Self-pay | Admitting: Oncology

## 2008-04-13 IMAGING — MG MM SCREEN MAMMOGRAM BILATERAL
5 series · 5 of 5 positions shown · non-contrast
Comparison: none

DG SCREEN MAMMOGRAM BILATERAL
Bilateral CC and MLO view(s) were taken.

SCREENING MAMMOGRAM:
There is a fibroglandular pattern.  No masses or malignant type calcifications are identified.  
Lumpectomy changes are noted on the left.  Compared with prior studies.

[R CC]
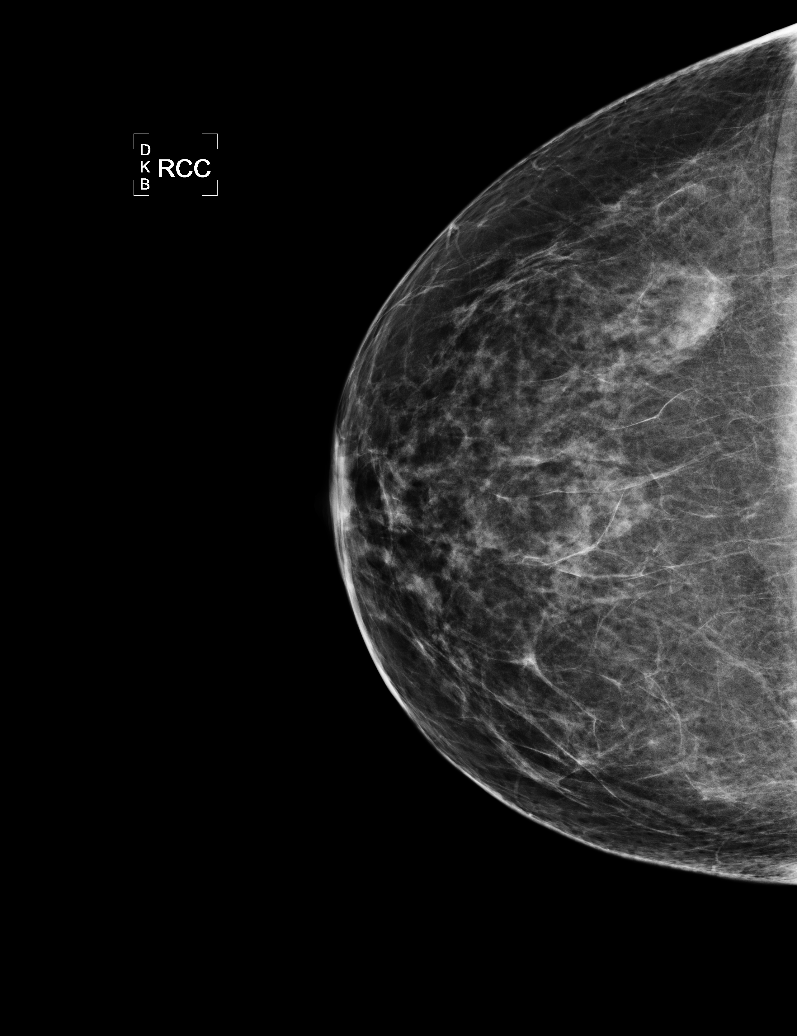

[L CC]
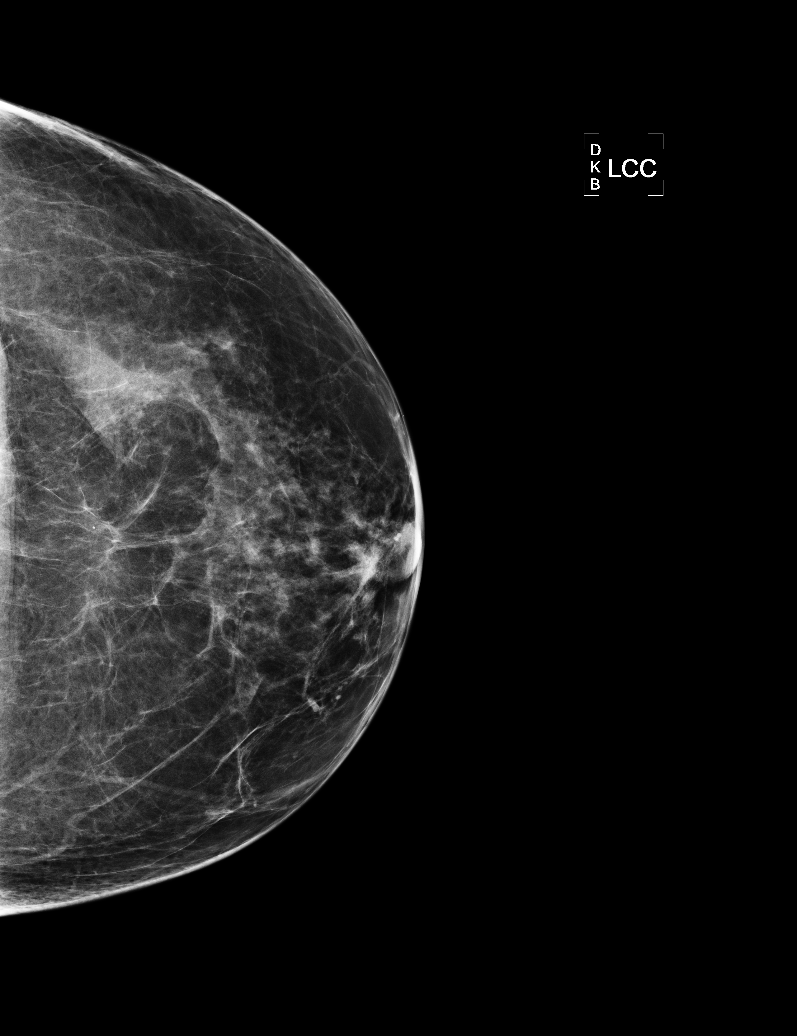

[L MLO (1 of 2)]
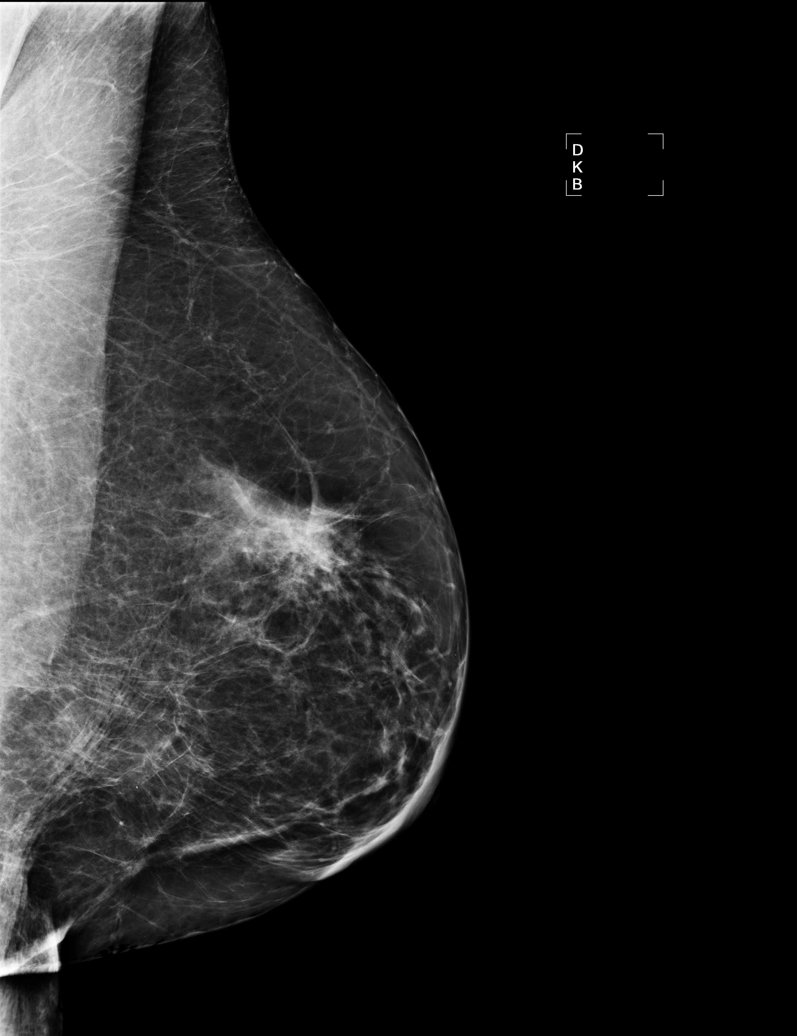

[R MLO]
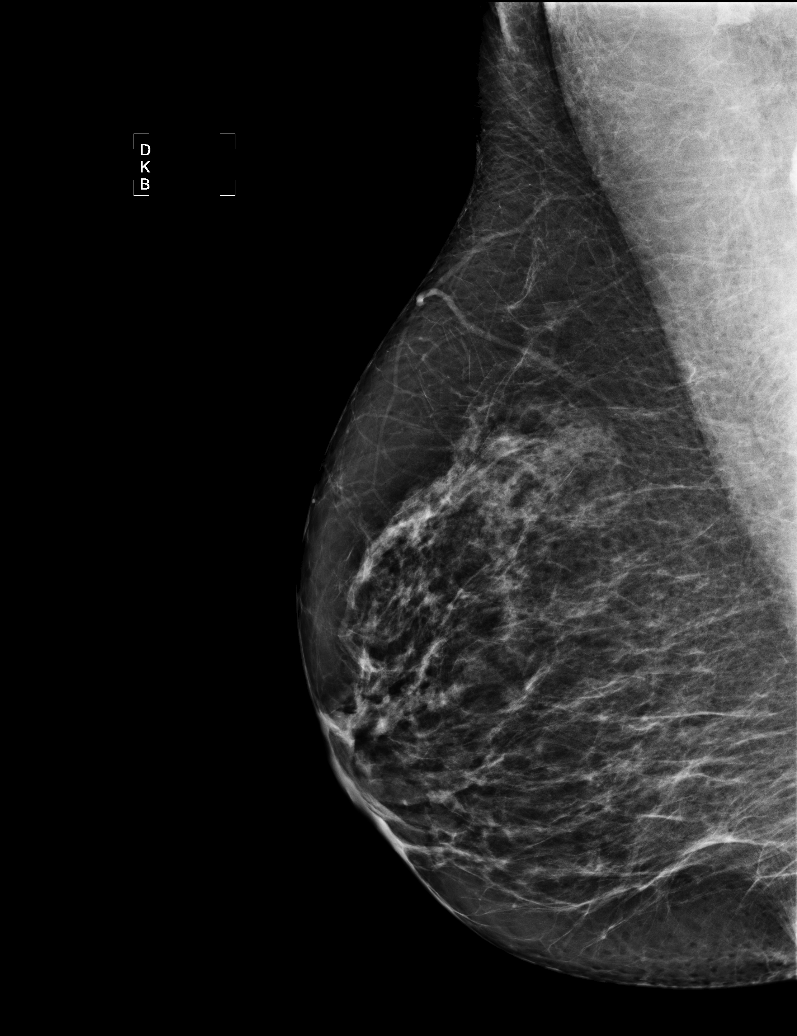

[L MLO (2 of 2)]
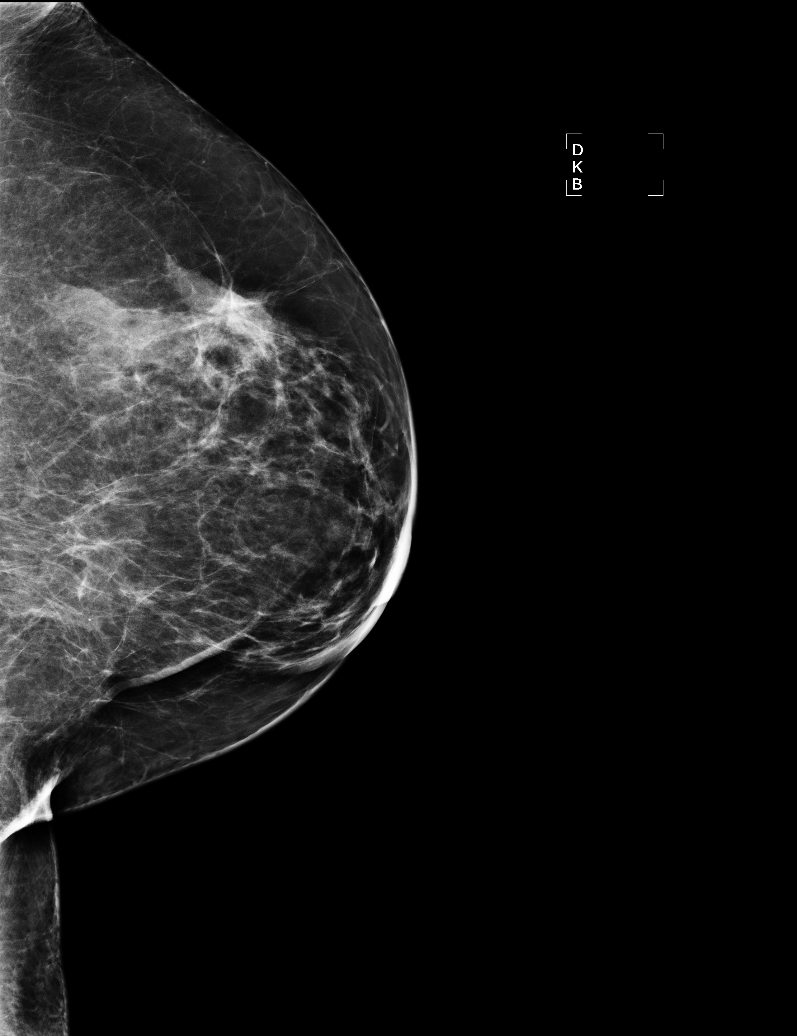

[5 of 5 positions shown; findings below may reference images not displayed]

IMPRESSION: No specific mammographic evidence of malignancy. Consider screening MRI every 1-2 years, given the 
patient's high risk status for breast cancer.  Next screening mammogram is recommended in one year.

ASSESSMENT: Negative - BI-RADS 1

Screening mammogram in 1 year.
ANALYZED BY COMPUTER AIDED DETECTION. , THIS PROCEDURE WAS A DIGITAL MAMMOGRAM.

## 2008-04-18 ENCOUNTER — Encounter: Payer: Self-pay | Admitting: Family Medicine

## 2008-04-18 LAB — CBC WITH DIFFERENTIAL/PLATELET
BASO%: 0.4 % (ref 0.0–2.0)
EOS%: 2.6 % (ref 0.0–7.0)
LYMPH%: 39.1 % (ref 14.0–48.0)
MCH: 32.4 pg (ref 26.0–34.0)
MCHC: 34.5 g/dL (ref 32.0–36.0)
MONO#: 0.2 10*3/uL (ref 0.1–0.9)
NEUT%: 52.8 % (ref 39.6–76.8)
Platelets: 193 10*3/uL (ref 145–400)
RBC: 4.09 10*6/uL (ref 3.70–5.32)
WBC: 3.7 10*3/uL — ABNORMAL LOW (ref 3.9–10.0)
lymph#: 1.4 10*3/uL (ref 0.9–3.3)

## 2008-04-18 LAB — COMPREHENSIVE METABOLIC PANEL
ALT: 11 U/L (ref 0–35)
AST: 18 U/L (ref 0–37)
CO2: 28 mEq/L (ref 19–32)
Creatinine, Ser: 0.87 mg/dL (ref 0.40–1.20)
Sodium: 141 mEq/L (ref 135–145)
Total Bilirubin: 0.4 mg/dL (ref 0.3–1.2)
Total Protein: 6.7 g/dL (ref 6.0–8.3)

## 2008-04-18 LAB — CANCER ANTIGEN 27.29: CA 27.29: 21 U/mL (ref 0–39)

## 2008-04-22 ENCOUNTER — Telehealth: Payer: Self-pay | Admitting: Family Medicine

## 2008-04-24 ENCOUNTER — Encounter: Payer: Self-pay | Admitting: Family Medicine

## 2008-04-25 ENCOUNTER — Encounter: Payer: Self-pay | Admitting: Family Medicine

## 2008-04-28 LAB — CONVERTED CEMR LAB
ALT: 11 units/L (ref 0–35)
AST: 17 units/L (ref 0–37)
Albumin: 4.9 g/dL (ref 3.5–5.2)
Alkaline Phosphatase: 31 units/L — ABNORMAL LOW (ref 39–117)
HDL: 74 mg/dL (ref >39)
LDL Cholesterol: 92 mg/dL (ref 0–99)
Total Protein: 6.9 g/dL (ref 6.0–8.3)
Triglycerides: 151 mg/dL — ABNORMAL HIGH (ref <150)

## 2008-06-10 ENCOUNTER — Ambulatory Visit (HOSPITAL_COMMUNITY): Admission: RE | Admit: 2008-06-10 | Discharge: 2008-06-10 | Payer: Self-pay | Admitting: Oncology

## 2008-06-23 ENCOUNTER — Ambulatory Visit: Payer: Self-pay | Admitting: Oncology

## 2008-06-25 ENCOUNTER — Encounter: Payer: Self-pay | Admitting: Family Medicine

## 2008-06-25 LAB — COMPREHENSIVE METABOLIC PANEL
ALT: 14 U/L (ref 0–35)
AST: 20 U/L (ref 0–37)
CO2: 30 mEq/L (ref 19–32)
Calcium: 10 mg/dL (ref 8.4–10.5)
Chloride: 102 mEq/L (ref 96–112)
Sodium: 142 mEq/L (ref 135–145)
Total Protein: 6.8 g/dL (ref 6.0–8.3)

## 2008-06-25 LAB — CBC WITH DIFFERENTIAL/PLATELET
BASO%: 0.4 % (ref 0.0–2.0)
EOS%: 2.4 % (ref 0.0–7.0)
MCHC: 34 g/dL (ref 32.0–36.0)
MONO#: 0.3 10*3/uL (ref 0.1–0.9)
RBC: 4.19 10*6/uL (ref 3.70–5.32)
RDW: 13 % (ref 11.3–14.5)
WBC: 3.5 10*3/uL — ABNORMAL LOW (ref 3.9–10.0)
lymph#: 1.4 10*3/uL (ref 0.9–3.3)

## 2008-09-09 ENCOUNTER — Ambulatory Visit: Payer: Self-pay | Admitting: Family Medicine

## 2008-09-23 ENCOUNTER — Encounter: Payer: Self-pay | Admitting: Family Medicine

## 2008-09-26 ENCOUNTER — Ambulatory Visit: Payer: Self-pay | Admitting: Family Medicine

## 2008-09-26 DIAGNOSIS — J309 Allergic rhinitis, unspecified: Secondary | ICD-10-CM | POA: Insufficient documentation

## 2008-10-09 ENCOUNTER — Encounter: Payer: Self-pay | Admitting: Family Medicine

## 2008-10-14 ENCOUNTER — Ambulatory Visit: Payer: Self-pay | Admitting: Family Medicine

## 2008-10-15 ENCOUNTER — Telehealth: Payer: Self-pay | Admitting: Family Medicine

## 2008-10-15 ENCOUNTER — Encounter: Payer: Self-pay | Admitting: Family Medicine

## 2008-10-20 ENCOUNTER — Telehealth: Payer: Self-pay | Admitting: Family Medicine

## 2008-10-27 ENCOUNTER — Ambulatory Visit: Payer: Self-pay | Admitting: Oncology

## 2008-10-27 ENCOUNTER — Ambulatory Visit: Payer: Self-pay | Admitting: Family Medicine

## 2008-10-27 LAB — CONVERTED CEMR LAB
Bilirubin Urine: NEGATIVE
Blood in Urine, dipstick: NEGATIVE
Glucose, Urine, Semiquant: NEGATIVE
Ketones, urine, test strip: NEGATIVE
Nitrite: NEGATIVE
Protein, U semiquant: NEGATIVE
Specific Gravity, Urine: 1.01
Urobilinogen, UA: 0.2
pH: 7

## 2008-10-28 ENCOUNTER — Encounter: Payer: Self-pay | Admitting: Family Medicine

## 2008-10-28 ENCOUNTER — Telehealth: Payer: Self-pay | Admitting: Family Medicine

## 2008-10-28 LAB — CONVERTED CEMR LAB
Clue Cells Wet Prep HPF POC: NONE SEEN
Yeast Wet Prep HPF POC: NONE SEEN

## 2008-10-29 ENCOUNTER — Encounter: Payer: Self-pay | Admitting: Family Medicine

## 2008-10-29 ENCOUNTER — Encounter: Admission: RE | Admit: 2008-10-29 | Discharge: 2008-10-29 | Payer: Self-pay | Admitting: Family Medicine

## 2008-10-29 LAB — CBC WITH DIFFERENTIAL/PLATELET
BASO%: 0.7 % (ref 0.0–2.0)
HCT: 39.7 % (ref 34.8–46.6)
HGB: 13.7 g/dL (ref 11.6–15.9)
MCHC: 34.6 g/dL (ref 31.5–36.0)
MONO#: 0.3 10*3/uL (ref 0.1–0.9)
NEUT%: 42.2 % (ref 38.4–76.8)
WBC: 4.3 10*3/uL (ref 3.9–10.3)
lymph#: 2.1 10*3/uL (ref 0.9–3.3)

## 2008-10-29 LAB — COMPREHENSIVE METABOLIC PANEL
ALT: 13 U/L (ref 0–35)
Albumin: 5 g/dL (ref 3.5–5.2)
CO2: 30 mEq/L (ref 19–32)
Calcium: 9.5 mg/dL (ref 8.4–10.5)
Chloride: 103 mEq/L (ref 96–112)
Creatinine, Ser: 0.69 mg/dL (ref 0.40–1.20)
Potassium: 3.8 mEq/L (ref 3.5–5.3)
Sodium: 142 mEq/L (ref 135–145)
Total Protein: 6.8 g/dL (ref 6.0–8.3)

## 2008-10-30 ENCOUNTER — Ambulatory Visit: Payer: Self-pay | Admitting: Family Medicine

## 2008-11-05 ENCOUNTER — Encounter: Payer: Self-pay | Admitting: Family Medicine

## 2008-11-26 ENCOUNTER — Telehealth: Payer: Self-pay | Admitting: Family Medicine

## 2008-11-27 ENCOUNTER — Encounter: Payer: Self-pay | Admitting: Family Medicine

## 2008-12-01 LAB — CONVERTED CEMR LAB
ALT: 14 units/L (ref 0–35)
AST: 18 units/L (ref 0–37)
Alkaline Phosphatase: 30 units/L — ABNORMAL LOW (ref 39–117)
Bilirubin, Direct: 0.1 mg/dL (ref 0.0–0.3)
Cholesterol: 210 mg/dL — ABNORMAL HIGH (ref 0–200)
HDL: 96 mg/dL (ref >39)
Total CHOL/HDL Ratio: 2.2

## 2009-02-25 ENCOUNTER — Encounter: Admission: RE | Admit: 2009-02-25 | Discharge: 2009-02-25 | Payer: Self-pay | Admitting: Oncology

## 2009-03-03 ENCOUNTER — Encounter: Payer: Self-pay | Admitting: Family Medicine

## 2009-03-12 ENCOUNTER — Ambulatory Visit: Payer: Self-pay | Admitting: Family Medicine

## 2009-03-13 ENCOUNTER — Encounter: Payer: Self-pay | Admitting: Family Medicine

## 2009-03-15 LAB — CONVERTED CEMR LAB
Cholesterol: 187 mg/dL (ref 0–200)
VLDL: 26 mg/dL (ref 0–40)
Vit D, 25-Hydroxy: 34 ng/mL (ref 30–89)

## 2009-03-20 ENCOUNTER — Ambulatory Visit: Payer: Self-pay | Admitting: Oncology

## 2009-03-25 ENCOUNTER — Encounter: Payer: Self-pay | Admitting: Family Medicine

## 2009-03-25 LAB — CBC WITH DIFFERENTIAL/PLATELET
HGB: 12.8 g/dL (ref 11.6–15.9)
MCH: 31.6 pg (ref 25.1–34.0)
MCHC: 34 g/dL (ref 31.5–36.0)
MCV: 93.2 fL (ref 79.5–101.0)
MONO%: 8.3 % (ref 0.0–14.0)
RBC: 4.03 10*6/uL (ref 3.70–5.45)
RDW: 12.5 % (ref 11.2–14.5)
lymph#: 1.4 10*3/uL (ref 0.9–3.3)

## 2009-03-25 LAB — COMPREHENSIVE METABOLIC PANEL
CO2: 29 mEq/L (ref 19–32)
Creatinine, Ser: 0.66 mg/dL (ref 0.40–1.20)
Glucose, Bld: 87 mg/dL (ref 70–99)
Total Bilirubin: 0.4 mg/dL (ref 0.3–1.2)
Total Protein: 6.2 g/dL (ref 6.0–8.3)

## 2009-03-25 LAB — CANCER ANTIGEN 27.29: CA 27.29: 20 U/mL (ref 0–39)

## 2009-04-15 ENCOUNTER — Encounter: Payer: Self-pay | Admitting: Family Medicine

## 2009-04-16 LAB — CONVERTED CEMR LAB: Vit D, 25-Hydroxy: 31 ng/mL (ref 30–89)

## 2009-05-21 ENCOUNTER — Ambulatory Visit: Payer: Self-pay | Admitting: Family Medicine

## 2009-05-21 DIAGNOSIS — M542 Cervicalgia: Secondary | ICD-10-CM | POA: Insufficient documentation

## 2009-07-13 ENCOUNTER — Ambulatory Visit: Payer: Self-pay | Admitting: Family Medicine

## 2009-07-14 ENCOUNTER — Encounter: Payer: Self-pay | Admitting: Family Medicine

## 2009-07-14 LAB — CONVERTED CEMR LAB
ALT: 14 units/L (ref 0–35)
AST: 21 units/L (ref 0–37)
Alkaline Phosphatase: 37 units/L — ABNORMAL LOW (ref 39–117)
CO2: 28 meq/L (ref 19–32)
Creatinine, Ser: 0.66 mg/dL (ref 0.40–1.20)
Sodium: 143 meq/L (ref 135–145)
Total Bilirubin: 0.4 mg/dL (ref 0.3–1.2)
Total Protein: 7.3 g/dL (ref 6.0–8.3)
Vit D, 25-Hydroxy: 35 ng/mL (ref 30–89)

## 2009-07-21 ENCOUNTER — Ambulatory Visit: Payer: Self-pay | Admitting: Oncology

## 2009-07-22 ENCOUNTER — Telehealth: Payer: Self-pay | Admitting: Family Medicine

## 2009-07-24 ENCOUNTER — Ambulatory Visit: Payer: Self-pay | Admitting: Family Medicine

## 2009-07-24 DIAGNOSIS — R42 Dizziness and giddiness: Secondary | ICD-10-CM | POA: Insufficient documentation

## 2009-07-27 ENCOUNTER — Encounter: Payer: Self-pay | Admitting: Family Medicine

## 2009-07-27 LAB — CBC WITH DIFFERENTIAL/PLATELET
Basophils Absolute: 0 10*3/uL (ref 0.0–0.1)
EOS%: 0 % (ref 0.0–7.0)
HGB: 14 g/dL (ref 11.6–15.9)
MCH: 32.1 pg (ref 25.1–34.0)
NEUT#: 5.7 10*3/uL (ref 1.5–6.5)
RBC: 4.36 10*6/uL (ref 3.70–5.45)
RDW: 12.8 % (ref 11.2–14.5)
lymph#: 1.5 10*3/uL (ref 0.9–3.3)

## 2009-07-27 LAB — COMPREHENSIVE METABOLIC PANEL
ALT: 12 U/L (ref 0–35)
AST: 15 U/L (ref 0–37)
Albumin: 5.2 g/dL (ref 3.5–5.2)
Alkaline Phosphatase: 36 U/L — ABNORMAL LOW (ref 39–117)
BUN: 20 mg/dL (ref 6–23)
Calcium: 10.4 mg/dL (ref 8.4–10.5)
Chloride: 100 mEq/L (ref 96–112)
Potassium: 3.6 mEq/L (ref 3.5–5.3)
Sodium: 140 mEq/L (ref 135–145)
Total Protein: 7.1 g/dL (ref 6.0–8.3)

## 2009-09-04 ENCOUNTER — Encounter: Payer: Self-pay | Admitting: Family Medicine

## 2009-09-15 ENCOUNTER — Encounter: Payer: Self-pay | Admitting: Family Medicine

## 2009-09-22 ENCOUNTER — Telehealth: Payer: Self-pay | Admitting: Family Medicine

## 2009-09-30 ENCOUNTER — Ambulatory Visit: Payer: Self-pay | Admitting: Family Medicine

## 2009-10-02 ENCOUNTER — Encounter: Payer: Self-pay | Admitting: Family Medicine

## 2009-10-07 ENCOUNTER — Telehealth (INDEPENDENT_AMBULATORY_CARE_PROVIDER_SITE_OTHER): Payer: Self-pay | Admitting: *Deleted

## 2009-10-07 ENCOUNTER — Ambulatory Visit: Payer: Self-pay | Admitting: Family Medicine

## 2009-10-07 DIAGNOSIS — K219 Gastro-esophageal reflux disease without esophagitis: Secondary | ICD-10-CM | POA: Insufficient documentation

## 2009-10-22 ENCOUNTER — Ambulatory Visit: Payer: Self-pay | Admitting: Family Medicine

## 2009-10-26 ENCOUNTER — Telehealth: Payer: Self-pay | Admitting: Family Medicine

## 2009-11-10 ENCOUNTER — Encounter: Admission: RE | Admit: 2009-11-10 | Discharge: 2009-11-10 | Payer: Self-pay | Admitting: Otolaryngology

## 2009-11-19 ENCOUNTER — Ambulatory Visit: Payer: Self-pay | Admitting: Oncology

## 2009-11-23 ENCOUNTER — Encounter: Payer: Self-pay | Admitting: Family Medicine

## 2009-11-23 LAB — CBC WITH DIFFERENTIAL/PLATELET
BASO%: 0.4 % (ref 0.0–2.0)
EOS%: 1.3 % (ref 0.0–7.0)
MCH: 32.3 pg (ref 25.1–34.0)
MCHC: 34.2 g/dL (ref 31.5–36.0)
MONO#: 0.2 10*3/uL (ref 0.1–0.9)
NEUT%: 53.1 % (ref 38.4–76.8)
RBC: 4.27 10*6/uL (ref 3.70–5.45)
RDW: 12.8 % (ref 11.2–14.5)
WBC: 3.2 10*3/uL — ABNORMAL LOW (ref 3.9–10.3)
lymph#: 1.2 10*3/uL (ref 0.9–3.3)

## 2009-11-23 LAB — COMPREHENSIVE METABOLIC PANEL
ALT: 15 U/L (ref 0–35)
AST: 21 U/L (ref 0–37)
Calcium: 9.5 mg/dL (ref 8.4–10.5)
Chloride: 102 mEq/L (ref 96–112)
Creatinine, Ser: 0.74 mg/dL (ref 0.40–1.20)
Potassium: 3.8 mEq/L (ref 3.5–5.3)
Sodium: 143 mEq/L (ref 135–145)
Total Protein: 6.8 g/dL (ref 6.0–8.3)

## 2009-11-24 ENCOUNTER — Telehealth: Payer: Self-pay | Admitting: Family Medicine

## 2009-11-26 ENCOUNTER — Telehealth (INDEPENDENT_AMBULATORY_CARE_PROVIDER_SITE_OTHER): Payer: Self-pay | Admitting: *Deleted

## 2009-11-27 ENCOUNTER — Ambulatory Visit: Payer: Self-pay | Admitting: Family Medicine

## 2009-11-27 DIAGNOSIS — H1045 Other chronic allergic conjunctivitis: Secondary | ICD-10-CM | POA: Insufficient documentation

## 2009-11-29 LAB — CONVERTED CEMR LAB
BUN: 15 mg/dL (ref 6–23)
CO2: 29 meq/L (ref 19–32)
Calcium: 10.4 mg/dL (ref 8.4–10.5)
Chloride: 100 meq/L (ref 96–112)
Cholesterol: 235 mg/dL — ABNORMAL HIGH (ref 0–200)
Creatinine, Ser: 0.62 mg/dL (ref 0.40–1.20)
Glucose, Bld: 97 mg/dL (ref 70–99)
HDL: 71 mg/dL (ref >39)
Total Bilirubin: 0.5 mg/dL (ref 0.3–1.2)
Total CHOL/HDL Ratio: 3.3
Triglycerides: 162 mg/dL — ABNORMAL HIGH (ref <150)
VLDL: 32 mg/dL (ref 0–40)

## 2009-12-01 ENCOUNTER — Telehealth: Payer: Self-pay | Admitting: Family Medicine

## 2009-12-24 ENCOUNTER — Ambulatory Visit: Payer: Self-pay | Admitting: Family Medicine

## 2009-12-28 ENCOUNTER — Encounter: Payer: Self-pay | Admitting: Family Medicine

## 2010-01-26 ENCOUNTER — Telehealth: Payer: Self-pay | Admitting: Family Medicine

## 2010-02-08 ENCOUNTER — Encounter: Payer: Self-pay | Admitting: Family Medicine

## 2010-02-08 LAB — HM COLONOSCOPY: HM Colonoscopy: ABNORMAL

## 2010-02-26 ENCOUNTER — Encounter: Admission: RE | Admit: 2010-02-26 | Discharge: 2010-02-26 | Payer: Self-pay | Admitting: Oncology

## 2010-03-01 ENCOUNTER — Ambulatory Visit: Payer: Self-pay | Admitting: Family Medicine

## 2010-03-01 DIAGNOSIS — E785 Hyperlipidemia, unspecified: Secondary | ICD-10-CM | POA: Insufficient documentation

## 2010-03-01 LAB — CONVERTED CEMR LAB
Ketones, urine, test strip: NEGATIVE
Protein, U semiquant: NEGATIVE
Specific Gravity, Urine: 1.01
Urobilinogen, UA: 0.2

## 2010-03-02 ENCOUNTER — Telehealth: Payer: Self-pay | Admitting: Family Medicine

## 2010-03-02 ENCOUNTER — Encounter: Payer: Self-pay | Admitting: Family Medicine

## 2010-03-04 ENCOUNTER — Telehealth (INDEPENDENT_AMBULATORY_CARE_PROVIDER_SITE_OTHER): Payer: Self-pay | Admitting: *Deleted

## 2010-03-04 ENCOUNTER — Encounter: Payer: Self-pay | Admitting: Family Medicine

## 2010-03-04 ENCOUNTER — Telehealth: Payer: Self-pay | Admitting: Family Medicine

## 2010-03-17 ENCOUNTER — Encounter: Payer: Self-pay | Admitting: Family Medicine

## 2010-03-18 ENCOUNTER — Ambulatory Visit: Payer: Self-pay | Admitting: Oncology

## 2010-03-22 ENCOUNTER — Encounter: Payer: Self-pay | Admitting: Family Medicine

## 2010-03-22 LAB — CBC WITH DIFFERENTIAL/PLATELET
Eosinophils Absolute: 0.1 10*3/uL (ref 0.0–0.5)
HCT: 38.2 % (ref 34.8–46.6)
LYMPH%: 44.6 % (ref 14.0–49.7)
MCHC: 34.5 g/dL (ref 31.5–36.0)
MCV: 93.2 fL (ref 79.5–101.0)
MONO#: 0.2 10*3/uL (ref 0.1–0.9)
MONO%: 7.7 % (ref 0.0–14.0)
NEUT#: 1.4 10*3/uL — ABNORMAL LOW (ref 1.5–6.5)
NEUT%: 45.5 % (ref 38.4–76.8)
Platelets: 206 10*3/uL (ref 145–400)
RBC: 4.09 10*6/uL (ref 3.70–5.45)
WBC: 3 10*3/uL — ABNORMAL LOW (ref 3.9–10.3)

## 2010-03-22 LAB — COMPREHENSIVE METABOLIC PANEL
ALT: 14 U/L (ref 0–35)
AST: 19 U/L (ref 0–37)
Albumin: 4.8 g/dL (ref 3.5–5.2)
CO2: 30 mEq/L (ref 19–32)
Calcium: 9.3 mg/dL (ref 8.4–10.5)
Chloride: 104 mEq/L (ref 96–112)
Potassium: 4.2 mEq/L (ref 3.5–5.3)
Total Protein: 6.4 g/dL (ref 6.0–8.3)

## 2010-03-22 LAB — MORPHOLOGY
PLT EST: ADEQUATE
Platelet Morphology: NORMAL

## 2010-04-23 ENCOUNTER — Ambulatory Visit: Payer: Self-pay | Admitting: Emergency Medicine

## 2010-07-12 ENCOUNTER — Ambulatory Visit: Payer: Self-pay | Admitting: Family Medicine

## 2010-07-12 DIAGNOSIS — N644 Mastodynia: Secondary | ICD-10-CM | POA: Insufficient documentation

## 2010-07-14 ENCOUNTER — Ambulatory Visit: Payer: Self-pay | Admitting: Oncology

## 2010-07-16 ENCOUNTER — Encounter: Admission: RE | Admit: 2010-07-16 | Discharge: 2010-07-16 | Payer: Self-pay | Admitting: Family Medicine

## 2010-07-16 ENCOUNTER — Encounter: Payer: Self-pay | Admitting: Family Medicine

## 2010-07-16 LAB — COMPREHENSIVE METABOLIC PANEL
CO2: 29 mEq/L (ref 19–32)
Creatinine, Ser: 0.87 mg/dL (ref 0.40–1.20)
Glucose, Bld: 133 mg/dL — ABNORMAL HIGH (ref 70–99)
Total Bilirubin: 0.3 mg/dL (ref 0.3–1.2)
Total Protein: 6.3 g/dL (ref 6.0–8.3)

## 2010-07-16 LAB — CBC WITH DIFFERENTIAL/PLATELET
BASO%: 0.3 % (ref 0.0–2.0)
EOS%: 2 % (ref 0.0–7.0)
LYMPH%: 48.2 % (ref 14.0–49.7)
MCHC: 33.6 g/dL (ref 31.5–36.0)
MCV: 93.3 fL (ref 79.5–101.0)
MONO%: 4.7 % (ref 0.0–14.0)
Platelets: 172 10*3/uL (ref 145–400)
RBC: 4.21 10*6/uL (ref 3.70–5.45)

## 2010-07-16 LAB — MORPHOLOGY

## 2010-07-21 ENCOUNTER — Telehealth: Payer: Self-pay | Admitting: Family Medicine

## 2010-08-03 ENCOUNTER — Encounter: Payer: Self-pay | Admitting: Family Medicine

## 2010-08-03 LAB — CBC WITH DIFFERENTIAL/PLATELET
Basophils Absolute: 0 10*3/uL (ref 0.0–0.1)
EOS%: 3.6 % (ref 0.0–7.0)
HGB: 13.1 g/dL (ref 11.6–15.9)
MCH: 31.4 pg (ref 25.1–34.0)
MCHC: 34.7 g/dL (ref 31.5–36.0)
MCV: 90.4 fL (ref 79.5–101.0)
MONO%: 8.1 % (ref 0.0–14.0)
NEUT%: 40.1 % (ref 38.4–76.8)
RDW: 12.1 % (ref 11.2–14.5)

## 2010-08-06 ENCOUNTER — Encounter
Admission: RE | Admit: 2010-08-06 | Discharge: 2010-08-06 | Payer: Self-pay | Source: Home / Self Care | Attending: Oncology | Admitting: Oncology

## 2010-08-11 ENCOUNTER — Encounter
Admission: RE | Admit: 2010-08-11 | Discharge: 2010-08-11 | Payer: Self-pay | Source: Home / Self Care | Attending: Oncology | Admitting: Oncology

## 2010-08-13 ENCOUNTER — Ambulatory Visit: Payer: Self-pay | Admitting: Oncology

## 2010-08-13 ENCOUNTER — Encounter: Payer: Self-pay | Admitting: Family Medicine

## 2010-08-19 ENCOUNTER — Encounter
Admission: RE | Admit: 2010-08-19 | Discharge: 2010-08-19 | Payer: Self-pay | Source: Home / Self Care | Attending: Oncology | Admitting: Oncology

## 2010-08-19 ENCOUNTER — Encounter: Payer: Self-pay | Admitting: Family Medicine

## 2010-09-08 ENCOUNTER — Encounter: Payer: Self-pay | Admitting: Family Medicine

## 2010-09-10 ENCOUNTER — Encounter: Payer: Self-pay | Admitting: Family Medicine

## 2010-09-19 ENCOUNTER — Encounter: Payer: Self-pay | Admitting: Oncology

## 2010-09-23 ENCOUNTER — Ambulatory Visit
Admission: RE | Admit: 2010-09-23 | Discharge: 2010-09-23 | Payer: Self-pay | Source: Home / Self Care | Attending: Family Medicine | Admitting: Family Medicine

## 2010-09-23 DIAGNOSIS — J019 Acute sinusitis, unspecified: Secondary | ICD-10-CM | POA: Insufficient documentation

## 2010-09-26 LAB — CONVERTED CEMR LAB
Protein, U semiquant: 30
Specific Gravity, Urine: 1.025
Urobilinogen, UA: 0.2
pH: 6

## 2010-09-30 ENCOUNTER — Ambulatory Visit
Admission: RE | Admit: 2010-09-30 | Discharge: 2010-09-30 | Disposition: A | Discharge status: Home / Self Care | Payer: BC Managed Care – PPO | Source: Ambulatory Visit | Attending: Family Medicine | Admitting: Family Medicine

## 2010-09-30 ENCOUNTER — Other Ambulatory Visit: Payer: Self-pay | Admitting: Family Medicine

## 2010-09-30 ENCOUNTER — Telehealth: Payer: Self-pay | Admitting: Family Medicine

## 2010-09-30 DIAGNOSIS — M542 Cervicalgia: Secondary | ICD-10-CM

## 2010-09-30 NOTE — Assessment & Plan Note (Signed)
Summary: Allergic conjunctivitis, f/u cholesterol   Vital Signs:  Patient profile:   65 year old female Height:      65.4 inches Weight:      128 pounds Pulse rate:   127 / minute BP sitting:   132 / 82  (left arm) Cuff size:   large  Vitals Entered By: Kathlene November (November 27, 2009 10:48 AM) CC: eyes burning and itching since Monday- no drainage   Primary Care Provider:  Linford Arnold,   CC:  eyes burning and itching since Monday- no drainage.  History of Present Illness: eyes burning and itching since Monday- no drainage. No chemical exposure.  Using an OTC eye drop Opcon A, an allergy eye drop. Helping some.  No watering or driange. Doesn't feel dry.  No contacts. No new eye makeup. No vision changes. No exacerbating sxs.   Current Medications (verified): 1)  Femara 2.5 Mg  Tabs (Letrozole) .... Take One Tablet By Mouth Once A Day 2)  Alphagan P 0.1 %  Soln (Brimonidine Tartrate) .... Take One Tablet By Mouth Three Timews A Day 3)  Macrobid 100 Mg  Caps (Nitrofurantoin Monohyd Macro) .... Take One Tablet By Mouth Once A Day 4)  Alendronate Sodium 70 Mg  Tabs (Alendronate Sodium) .... Take 1 Tablet By Mouth Once A Week 5)  Calcium 1250 Mg  Tabs (Calcium Carbonate) .... Take One Tablet By Mouth Once A Ay 6)  Vitamin D 1000 Unit  Tabs (Cholecalciferol) .... Take One Tablet By Mouth Once A Day 7)  Lorazepam 1 Mg  Tabs (Lorazepam) .... Take One Tablet By Mouth Once A Day 8)  Fish Oil 1000 Mg  Caps (Omega-3 Fatty Acids) .... 2 Daily 9)  Simvastatin 40 Mg Tabs (Simvastatin) .... Take 1 Tablet By Mouth Once A Day At Bedtime 10)  Mobic 7.5 Mg Tabs (Meloxicam) .... Take 1 Tablet By Mouth Once A Day 11)  Cyclobenzaprine Hcl 10 Mg Tabs (Cyclobenzaprine Hcl) .... Take 1 Tablet By Mouth Once A Day At Bedtime As Needed Back Spasms 12)  Patanase 0.6 % Soln (Olopatadine Hcl) .... Spray 2 Sprays Each Nostril Twice A Day 13)  Xyzal 5 Mg Tabs (Levocetirizine Dihydrochloride) .... Take 1 Tablet By Mouth  Once A Day 14)  Singulair 10 Mg Tabs (Montelukast Sodium) 15)  Omnaris 50 Mcg/act Susp (Ciclesonide) .... 2 Spray in Each Nostril 16)  Hydrocodone-Acetaminophen 5-500 Mg Tabs (Hydrocodone-Acetaminophen) .... Take 1 Tablet By Mouth Two Times A Day As Needed 17)  Tramadol Hcl 50 Mg Tabs (Tramadol Hcl) .... Take 1 Tablet By Mouth Three Times A Day As Needed For Pain 18)  Omeprazole 20 Mg Cpdr (Omeprazole) .... Take 1 Tablet By Mouth Once A Day 19)  Nystatin 100000 Unit/ml Susp (Nystatin) .... 6ml Po Qid For 5 Days  Retain and Swish in Mouth For As Long As Possible Before Swallowing. For  Allergies (verified): 1)  ! Duricef  Comments:  Nurse/Medical Assistant: The patient's medications and allergies were reviewed with the patient and were updated in the Medication and Allergy Lists. Kathlene November (November 27, 2009 10:49 AM)  Physical Exam  General:  Well-developed,well-nourished,in no acute distress; alert,appropriate and cooperative throughout examination Eyes:  Mild bilat lid swelling with mild scleral inflammation.  EOMintact.    Impression & Recommendations:  Problem # 1:  ALLERGIC CONJUNCTIVITIS (ICD-372.14) I dont' hear any triggers in her hx to suggest a contact dermatitis. She does have a hx of allergic rhinitis so likely allergic eye.Samples of  pataday given for relief.  Call if any concnerns or not better in 4-5 days.   Discussed treatment, and urged patient to wash hands carefully after touching face.   Problem # 2:  HYPERLIPIDEMIA, MIXED (ICD-272.2) On the simvastatin gor 8 weeks now. We changed because of cost concerns. Due to recheck her level today. Denies any SE or muscle aches.  Her updated medication list for this problem includes:    Simvastatin 40 Mg Tabs (Simvastatin) .Marland Kitchen... Take 1 tablet by mouth once a day at bedtime  Orders: T-Lipid Profile ([id number]) T-Comprehensive Metabolic Panel ([id number])  Complete Medication List: 1)  Femara 2.5 Mg Tabs (Letrozole)  .... Take one tablet by mouth once a day 2)  Alphagan P 0.1 % Soln (Brimonidine tartrate) .... Take one tablet by mouth three timews a day 3)  Macrobid 100 Mg Caps (Nitrofurantoin monohyd macro) .... Take one tablet by mouth once a day 4)  Alendronate Sodium 70 Mg Tabs (Alendronate sodium) .... Take 1 tablet by mouth once a week 5)  Calcium 1250 Mg Tabs (Calcium carbonate) .... Take one tablet by mouth once a ay 6)  Vitamin D 1000 Unit Tabs (Cholecalciferol) .... Take one tablet by mouth once a day 7)  Lorazepam 1 Mg Tabs (Lorazepam) .... Take one tablet by mouth once a day 8)  Fish Oil 1000 Mg Caps (Omega-3 fatty acids) .... 2 daily 9)  Simvastatin 40 Mg Tabs (Simvastatin) .... Take 1 tablet by mouth once a day at bedtime 10)  Mobic 7.5 Mg Tabs (Meloxicam) .... Take 1 tablet by mouth once a day 11)  Cyclobenzaprine Hcl 10 Mg Tabs (Cyclobenzaprine hcl) .... Take 1 tablet by mouth once a day at bedtime as needed back spasms 12)  Patanase 0.6 % Soln (Olopatadine hcl) .... Spray 2 sprays each nostril twice a day 13)  Xyzal 5 Mg Tabs (Levocetirizine dihydrochloride) .... Take 1 tablet by mouth once a day 14)  Singulair 10 Mg Tabs (Montelukast sodium) 15)  Omnaris 50 Mcg/act Susp (Ciclesonide) .... 2 spray in each nostril 16)  Hydrocodone-acetaminophen 5-500 Mg Tabs (Hydrocodone-acetaminophen) .... Take 1 tablet by mouth two times a day as needed 17)  Tramadol Hcl 50 Mg Tabs (Tramadol hcl) .... Take 1 tablet by mouth three times a day as needed for pain 18)  Omeprazole 20 Mg Cpdr (Omeprazole) .... Take 1 tablet by mouth once a day 19)  Nystatin 100000 Unit/ml Susp (Nystatin) .... 6ml po qid for 5 days  retain and swish in mouth for as long as possible before swallowing. for  Patient Instructions: 1)  We will call you with your cholesterol results. 2)  Call me Tuesday if your eyes are not better on the Pataday. ONe drop in each eye once a day.

## 2010-09-30 NOTE — Assessment & Plan Note (Signed)
Summary: Back pain, .lipids, vaginitis   Vital Signs:  Patient profile:   65 year old female Height:      65.4 inches Weight:      132 pounds Pulse rate:   124 / minute BP sitting:   140 / 76  (left arm) Cuff size:   regular  Vitals Entered By: Kathlene November (September 30, 2009 10:15 AM) CC: would like cholesterol and pain med changed   Primary Care Provider:  Linford Arnold,   CC:  would like cholesterol and pain med changed.  History of Present Illness: Darvocet off the market and Dr. Darrold Span gave her hydrocdone  for her chronic neck and back pain and feels it doesn't work really as well as the darvocet. Has tried tramodol ite past and it didn't seem to help much. Alreayd on mobic.   Thought the simvastain was causing memory problems so  switched to lipitor, but now can't afford the Lipitor. Would like to go back to the simvastating but doesn't want to take the 80mg  dose. Would like to start back at the 40mg  and work on her diet and then recheck her labs.   Has had 2 rounds of ABX adn now have vaginitis sxs. Has  been using OTC treatment. Also had tamiflu last week.  Would like tx with Diflucan.   Current Medications (verified): 1)  Femara 2.5 Mg  Tabs (Letrozole) .... Take One Tablet By Mouth Once A Day 2)  Alphagan P 0.1 %  Soln (Brimonidine Tartrate) .... Take One Tablet By Mouth Three Timews A Day 3)  Macrobid 100 Mg  Caps (Nitrofurantoin Monohyd Macro) .... Take One Tablet By Mouth Once A Day 4)  Alendronate Sodium 70 Mg  Tabs (Alendronate Sodium) .... Take 1 Tablet By Mouth Once A Week 5)  Calcium 1250 Mg  Tabs (Calcium Carbonate) .... Take One Tablet By Mouth Once A Ay 6)  Vitamin D 1000 Unit  Tabs (Cholecalciferol) .... Take One Tablet By Mouth Once A Day 7)  Lorazepam 1 Mg  Tabs (Lorazepam) .... Take One Tablet By Mouth Once A Day 8)  Darvocet-N 100 100-650 Mg  Tabs (Propoxyphene N-Apap) .... Take One Tablet Four Times A Day As Needed 9)  Fish Oil 1000 Mg  Caps (Omega-3 Fatty  Acids) .... 2 Daily 10)  Lipitor 40 Mg Tabs (Atorvastatin Calcium) .... Take 1 Tablet By Mouth Once A Day At Bedtime 11)  Mobic 7.5 Mg Tabs (Meloxicam) .... Take 1 Tablet By Mouth Once A Day 12)  Cyclobenzaprine Hcl 10 Mg Tabs (Cyclobenzaprine Hcl) .... Take 1 Tablet By Mouth Once A Day At Bedtime As Needed Back Spasms 13)  Patanase 0.6 % Soln (Olopatadine Hcl) .... Spray 2 Sprays Each Nostril Twice A Day 14)  Xyzal 5 Mg Tabs (Levocetirizine Dihydrochloride) .... Take 1 Tablet By Mouth Once A Day 15)  Singulair 10 Mg Tabs (Montelukast Sodium) 16)  Omnaris 50 Mcg/act Susp (Ciclesonide) .... 2 Spray in Each Nostril 17)  Zithromax Z-Pak 250 Mg Tabs (Azithromycin) .... Take As Directed. 18)  Levaquin 750 Mg Tabs (Levofloxacin) .... One By Mouth Once Daily For 5 Days 19)  Prednisone 10 Mg Tabs (Prednisone) .... 2 Po Bid For 3 Days, Then 1 Bid For 2 Days, Then 1 Daily For 2 Days.  Take Pc 20)  Meclizine Hcl 25 Mg Tabs (Meclizine Hcl) .... One By Mouth Two Times A Day To Three Times A Day As Needed For Dizziness 21)  Tamiflu 75 Mg Caps (Oseltamivir Phosphate) .Marland KitchenMarland KitchenMarland Kitchen  Take 1 Tablet By Mouth Once A Day For 10 Days  Allergies (verified): 1)  ! Duricef  Comments:  Nurse/Medical Assistant: The patient's medications and allergies were reviewed with the patient and were updated in the Medication and Allergy Lists. Kathlene November (September 30, 2009 10:16 AM)  Physical Exam  General:  Well-developed,well-nourished,in no acute distress; alert,appropriate and cooperative throughout examination Lungs:  Normal respiratory effort, chest expands symmetrically. Lungs are clear to auscultation, no crackles or wheezes. Heart:  Normal rate and regular rhythm. S1 and S2 normal without gallop, murmur, click, rub or other extra sounds. Skin:  no rashes.   Psych:  Cognition and judgment appear intact. Alert and cooperative with normal attention span and concentration. No apparent delusions, illusions,  hallucinations   Impression & Recommendations:  Problem # 1:  BACK PAIN (ICD-724.5) Since darvocet off the market will refill her hydorcone that onc gave her. Also discussed trying relefen instead of Mobic or retring the tramadol. Warned of potential addictive potential of narcotics.  Readdress in one month.  The following medications were removed from the medication list:    Darvocet-n 100 100-650 Mg Tabs (Propoxyphene n-apap) .Marland Kitchen... Take one tablet four times a day as needed Her updated medication list for this problem includes:    Mobic 7.5 Mg Tabs (Meloxicam) .Marland Kitchen... Take 1 tablet by mouth once a day    Cyclobenzaprine Hcl 10 Mg Tabs (Cyclobenzaprine hcl) .Marland Kitchen... Take 1 tablet by mouth once a day at bedtime as needed back spasms    Hydrocodone-acetaminophen 5-500 Mg Tabs (Hydrocodone-acetaminophen) .Marland Kitchen... Take 1 tablet by mouth two times a day as needed  Problem # 2:  HYPERLIPIDEMIA, MIXED (ICD-272.2) Will switch to simvastatin to retry because of cost but I dont' think the 40mg  will control her numbners well. Will restat the 40mg  adn then recheck the level in about 6-8 weeks.    Plans on geting her flu shot Her updated medication list for this problem includes:    Simvastatin 40 Mg Tabs (Simvastatin) .Marland Kitchen... Take 1 tablet by mouth once a day at bedtime  Problem # 3:  VAGINITIS (ICD-616.10) Not unlikely with her recent rounds of ABX.  Will treat with diflucan. Call if sxs not resolving and will have her come in for a wet prep.  The following medications were removed from the medication list:    Zithromax Z-pak 250 Mg Tabs (Azithromycin) .Marland Kitchen... Take as directed.    Levaquin 750 Mg Tabs (Levofloxacin) ..... One by mouth once daily for 5 days Her updated medication list for this problem includes:    Macrobid 100 Mg Caps (Nitrofurantoin monohyd macro) .Marland Kitchen... Take one tablet by mouth once a day  Complete Medication List: 1)  Femara 2.5 Mg Tabs (Letrozole) .... Take one tablet by mouth once a  day 2)  Alphagan P 0.1 % Soln (Brimonidine tartrate) .... Take one tablet by mouth three timews a day 3)  Macrobid 100 Mg Caps (Nitrofurantoin monohyd macro) .... Take one tablet by mouth once a day 4)  Alendronate Sodium 70 Mg Tabs (Alendronate sodium) .... Take 1 tablet by mouth once a week 5)  Calcium 1250 Mg Tabs (Calcium carbonate) .... Take one tablet by mouth once a ay 6)  Vitamin D 1000 Unit Tabs (Cholecalciferol) .... Take one tablet by mouth once a day 7)  Lorazepam 1 Mg Tabs (Lorazepam) .... Take one tablet by mouth once a day 8)  Fish Oil 1000 Mg Caps (Omega-3 fatty acids) .... 2 daily 9)  Simvastatin  40 Mg Tabs (Simvastatin) .... Take 1 tablet by mouth once a day at bedtime 10)  Mobic 7.5 Mg Tabs (Meloxicam) .... Take 1 tablet by mouth once a day 11)  Cyclobenzaprine Hcl 10 Mg Tabs (Cyclobenzaprine hcl) .... Take 1 tablet by mouth once a day at bedtime as needed back spasms 12)  Patanase 0.6 % Soln (Olopatadine hcl) .... Spray 2 sprays each nostril twice a day 13)  Xyzal 5 Mg Tabs (Levocetirizine dihydrochloride) .... Take 1 tablet by mouth once a day 14)  Singulair 10 Mg Tabs (Montelukast sodium) 15)  Omnaris 50 Mcg/act Susp (Ciclesonide) .... 2 spray in each nostril 16)  Hydrocodone-acetaminophen 5-500 Mg Tabs (Hydrocodone-acetaminophen) .... Take 1 tablet by mouth two times a day as needed 17)  Fluconazole 150 Mg Tabs (Fluconazole) .... Take 1 tablet by mouth once a day  Patient Instructions: 1)  Recheck  Prescriptions: FLUCONAZOLE 150 MG TABS (FLUCONAZOLE) Take 1 tablet by mouth once a day  #1 x 0   Entered and Authorized by:   [name] MD   Signed by:   [name] MD on 09/30/2009   Method used:   Electronically to        [facility]  [id number] (retail)       [address]       [place], [place]  [address]       Ph: [phone]       Fax: [phone]   RxID:   [id number] SIMVASTATIN 40 MG TABS (SIMVASTATIN) Take 1 tablet by mouth once a day at  bedtime  #90 x 0   Entered and Authorized by:   [name] MD   Signed by:   [name] MD on 09/30/2009   Method used:   Electronically to        [facility]  [address] [phone]* (retail)       [address]       [place], [place]  [address]       Ph: [phone]       Fax: [phone]   RxID:   [id number] HYDROCODONE-ACETAMINOPHEN 5-500 MG TABS (HYDROCODONE-ACETAMINOPHEN) Take 1 tablet by mouth two times a day as needed  #60 x 0   Entered and Authorized by:   [name] MD   Signed by:   [name] MD on 09/30/2009   Method used:   Print then Give to Patient   RxID:   [id number]

## 2010-09-30 NOTE — Letter (Signed)
Summary: [facility]  [facility]   Imported By: Lanelle Bal 09/06/2010 11:39:38  _____________________________________________________________________  External Attachment:    Type:   Image     Comment:   External Document

## 2010-09-30 NOTE — Letter (Signed)
Summary: Su Philomena Doheny, MD  [facility] Philomena Doheny, MD   Imported By: Lanelle Bal 09/17/2009 11:12:51  _____________________________________________________________________  External Attachment:    Type:   Image     Comment:   External Document

## 2010-09-30 NOTE — Assessment & Plan Note (Signed)
Summary: Acute sinusitis   Vital Signs:  Patient profile:   65 year old female Weight:      130.12 pounds Temp:     98.5 degrees F oral Pulse rate:   124 / minute Pulse rhythm:   regular BP sitting:   126 / 76 Cuff size:   regular  Vitals Entered By: Kern Reap CMA Duncan Dull) (October 22, 2009 1:09 PM) CC: sinus pressure, headache, drainage- Mucinex D with little relief Is Patient Diabetic? No Pain Assessment Patient in pain? no        Primary Care Provider:  Linford Arnold,   CC:  sinus pressure, headache, and drainage- Mucinex D with little relief.  History of Present Illness: sinus pressure, headache, drainage- Mucinex D with little relief.  Sxs started 2 weeks ago. Then last weekend right ear started hearing really loud noise.  Having pain radiating into her teeth. Hx of allergies. Now has post nasal drip.  Felt dizzy last night.  Still on omnaris, singulair adn nasal saline wash.  No fever.   Allergies: 1)  ! Duricef  Physical Exam  General:  Well-developed,well-nourished,in no acute distress; alert,appropriate and cooperative throughout examination Head:  Normocephalic and atraumatic without obvious abnormalities. No apparent alopecia or balding. Eyes:  No corneal or conjunctival inflammation noted. EOMI. Perrla. Ears:  External ear exam shows no significant lesions or deformities.  Otoscopic examination reveals clear canals, tympanic membranes are intact bilaterally without bulging, retraction, inflammation or discharge. Hearing is grossly normal bilaterally. Nose:  External nasal examination shows no deformity or inflammation. Nasal mucosa are pink and moist without lesions or exudates. Mouth:  Oral mucosa and oropharynx without lesions or exudates.  Teeth in good repair. Neck:  No deformities, masses, or tenderness noted. Lungs:  Normal respiratory effort, chest expands symmetrically. Lungs are clear to auscultation, no crackles or wheezes. Heart:  Normal rate and  regular rhythm. S1 and S2 normal without gallop, murmur, click, rub or other extra sounds. Skin:  no rashes.   Cervical Nodes:  No lymphadenopathy noted Axillary Nodes:  No palpable lymphadenopathy Psych:  Cognition and judgment appear intact. Alert and cooperative with normal attention span and concentration. No apparent delusions, illusions, hallucinations   Impression & Recommendations:  Problem # 1:  SINUSITIS - ACUTE-NOS (ICD-461.9)  Her updated medication list for this problem includes:    Patanase 0.6 % Soln (Olopatadine hcl) ..... Spray 2 sprays each nostril twice a day    Omnaris 50 Mcg/act Susp (Ciclesonide) .Marland Kitchen... 2 spray in each nostril    Augmentin 875-125 Mg Tabs (Amoxicillin-pot clavulanate) .Marland Kitchen... Take 1 tablet by mouth two times a day for 14 days  Instructed on treatment. Call if symptoms persist or worsen. Make sure to continue allergy meds. Complete ABX adn call if not better in 7 days.   Complete Medication List: 1)  Femara 2.5 Mg Tabs (Letrozole) .... Take one tablet by mouth once a day 2)  Alphagan P 0.1 % Soln (Brimonidine tartrate) .... Take one tablet by mouth three timews a day 3)  Macrobid 100 Mg Caps (Nitrofurantoin monohyd macro) .... Take one tablet by mouth once a day 4)  Alendronate Sodium 70 Mg Tabs (Alendronate sodium) .... Take 1 tablet by mouth once a week 5)  Calcium 1250 Mg Tabs (Calcium carbonate) .... Take one tablet by mouth once a ay 6)  Vitamin D 1000 Unit Tabs (Cholecalciferol) .... Take one tablet by mouth once a day 7)  Lorazepam 1 Mg Tabs (Lorazepam) .... Take one tablet  by mouth once a day 8)  Fish Oil 1000 Mg Caps (Omega-3 fatty acids) .... 2 daily 9)  Simvastatin 40 Mg Tabs (Simvastatin) .... Take 1 tablet by mouth once a day at bedtime 10)  Mobic 7.5 Mg Tabs (Meloxicam) .... Take 1 tablet by mouth once a day 11)  Cyclobenzaprine Hcl 10 Mg Tabs (Cyclobenzaprine hcl) .... Take 1 tablet by mouth once a day at bedtime as needed back  spasms 12)  Patanase 0.6 % Soln (Olopatadine hcl) .... Spray 2 sprays each nostril twice a day 13)  Xyzal 5 Mg Tabs (Levocetirizine dihydrochloride) .... Take 1 tablet by mouth once a day 14)  Singulair 10 Mg Tabs (Montelukast sodium) 15)  Omnaris 50 Mcg/act Susp (Ciclesonide) .... 2 spray in each nostril 16)  Hydrocodone-acetaminophen 5-500 Mg Tabs (Hydrocodone-acetaminophen) .... Take 1 tablet by mouth two times a day as needed 17)  Tramadol Hcl 50 Mg Tabs (Tramadol hcl) .... Take 1 tablet by mouth three times a day as needed for pain 18)  Omeprazole 20 Mg Cpdr (Omeprazole) .... Take 1 tablet by mouth once a day 19)  Augmentin 875-125 Mg Tabs (Amoxicillin-pot clavulanate) .... Take 1 tablet by mouth two times a day for 14 days Prescriptions: AUGMENTIN 875-125 MG TABS (AMOXICILLIN-POT CLAVULANATE) Take 1 tablet by mouth two times a day for 14 days  #28 x 0   Entered and Authorized by:   [name] MD   Signed by:   [name] MD on 10/22/2009   Method used:   Electronically to        [facility]  [id number] (retail)       [address]       [place], [place]  [address]       Ph: [phone]       Fax: [phone]   RxID:   [id number]

## 2010-09-30 NOTE — Progress Notes (Signed)
Summary: refill request  Phone Note Refill Request Message from:  Patient on March 04, 2010 11:16 AM  Refills Requested: Medication #1:  LORAZEPAM 1 MG  TABS Take one tablet by mouth once a day   Dosage confirmed as above?Dosage Confirmed Pt. states she talk to Selena Batten last week but this has not been completed yet.[name]  March 04, 2010 11:16 AM   Initial call taken by: [name],  March 04, 2010 11:16 AM  Follow-up for Phone Call        Rx called to pharm Follow-up by: Payton Spark CMA,  March 04, 2010 11:31 AM

## 2010-09-30 NOTE — Progress Notes (Signed)
Summary: Pataday samples  Phone Note Call from Patient Call back at Home Phone [phone]   Caller: Patient Call For: Anita Gasser MD Summary of Call: Pataday sample helped some is doing better but still has some itching and burning and didn't know if you wanted to call her in a rx or what Initial call taken by: Kathlene November,  December 01, 2009 2:04 PM  Follow-up for Phone Call        Lets continue the pataday, but also will refer to ophtho for further evaluation.   Follow-up by: Anita Gasser MD,  December 01, 2009 3:18 PM  Additional Follow-up for Phone Call Additional follow up Details #1::        Pt notifeid. Goes to [facility] already Additional Follow-up by: Kathlene November,  December 01, 2009 3:33 PM    New/Updated Medications: PATADAY 0.2 % SOLN (OLOPATADINE HCL) one drop in each eye daily. Prescriptions: PATADAY 0.2 % SOLN (OLOPATADINE HCL) one drop in each eye daily.  #1 bottle x 0   Entered and Authorized by:   Anita Gasser MD   Signed by:   Anita Gasser MD on 12/01/2009   Method used:   Electronically to        [facility]  [address] [phone]* (retail)       [address]       [place], [place]  [address]       Ph: [phone]       Fax: [phone]   RxID:   [id number]
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
## 2010-09-30 NOTE — Procedures (Signed)
Summary: Colonoscopy/[facility]  Colonoscopy/[facility]   Imported By: Lanelle Bal 03/18/2010 09:18:29  _____________________________________________________________________  External Attachment:    Type:   Image     Comment:   External Document

## 2010-09-30 NOTE — Assessment & Plan Note (Signed)
Summary: POSS UTI/TJ   Vital Signs:  Patient Profile:   65 Years Old Female CC:      ? UTI, rm 3 Height:     65.4 inches Weight:      133 pounds O2 Sat:      97 % O2 treatment:    Room Air Temp:     98.3 degrees F oral Pulse rate:   100 / minute Resp:     12 per minute BP sitting:   120 / 76  (right arm) Cuff size:   large  Pt. in pain?   no  Vitals Entered By: Lajean Saver RN (March 01, 2010 1:12 PM)                   Updated Prior Medication List: FEMARA 2.5 MG  TABS (LETROZOLE) Take one tablet by mouth once a day ALPHAGAN P 0.1 %  SOLN (BRIMONIDINE TARTRATE) Take one tablet by mouth three timews a day MACROBID 100 MG  CAPS (NITROFURANTOIN MONOHYD MACRO) Take one tablet by mouth once a day ALENDRONATE SODIUM 70 MG  TABS (ALENDRONATE SODIUM) Take 1 tablet by mouth once a week CALCIUM 1250 MG  TABS (CALCIUM CARBONATE) Take one tablet by mouth once a ay VITAMIN D 1000 UNIT  TABS (CHOLECALCIFEROL) Take one tablet by mouth once a day LORAZEPAM 1 MG  TABS (LORAZEPAM) Take one tablet by mouth once a day FISH OIL 1000 MG  CAPS (OMEGA-3 FATTY ACIDS) 2 daily SIMVASTATIN 40 MG TABS (SIMVASTATIN) 1 tab by mouth qhs MOBIC 7.5 MG TABS (MELOXICAM) Take 1 tablet by mouth once a day CYCLOBENZAPRINE HCL 10 MG TABS (CYCLOBENZAPRINE HCL) Take 1 tablet by mouth once a day at bedtime as needed back spasms PATANASE 0.6 % SOLN (OLOPATADINE HCL) Spray 2 sprays each nostril twice a day XYZAL 5 MG TABS (LEVOCETIRIZINE DIHYDROCHLORIDE) Take 1 tablet by mouth once a day SINGULAIR 10 MG TABS (MONTELUKAST SODIUM)  OMNARIS 50 MCG/ACT SUSP (CICLESONIDE) 2 spray in each nostril HYDROCODONE-ACETAMINOPHEN 5-500 MG TABS (HYDROCODONE-ACETAMINOPHEN) Take 1 tablet by mouth two times a day as needed TRAMADOL HCL 50 MG TABS (TRAMADOL HCL) Take 1 tablet by mouth three times a day as needed for pain OMEPRAZOLE 20 MG CPDR (OMEPRAZOLE) Take 1 tablet by mouth once a day PATADAY 0.2 % SOLN (OLOPATADINE HCL) one drop  in each eye daily. DIAZEPAM 2 MG TABS (DIAZEPAM) Take 1 tablet by mouth three times a day as needed for vertigo MECLIZINE HCL 25 MG TABS (MECLIZINE HCL) one by mouth up to 2-3 x a day for vertigo.  Current Allergies (reviewed today): ! DURICEFHistory of Present Illness Chief Complaint: ? UTI, rm 3 History of Present Illness:  Subjective:  Patient has a history of recurring UTI and takes Macrobid, one tab daily for prevention.  Yesterday she noted some mild daytime frequency without dysuria, and mild nocturia.  No hesitancy or urgency.  No abdominal or pelvic pain.  No fevers, chills, and sweats   REVIEW OF SYSTEMS Constitutional Symptoms      Denies fever, chills, night sweats, weight loss, weight gain, and fatigue.  Eyes       Denies change in vision, eye pain, eye discharge, glasses, contact lenses, and eye surgery. Ear/Nose/Throat/Mouth       Denies hearing loss/aids, change in hearing, ear pain, ear discharge, dizziness, frequent runny nose, frequent nose bleeds, sinus problems, sore throat, hoarseness, and tooth pain or bleeding.      Comments: "bad taste" in mouth  X 1 1/2 weeks Respiratory       Denies dry cough, productive cough, wheezing, shortness of breath, asthma, bronchitis, and emphysema/COPD.  Cardiovascular       Denies murmurs, chest pain, and tires easily with exhertion.    Gastrointestinal       Denies stomach pain, nausea/vomiting, diarrhea, constipation, blood in bowel movements, and indigestion. Genitourniary       Denies painful urination, blood or discharge from vagina, kidney stones, and loss of urinary control.      Comments: urinary frequency Neurological       Denies paralysis, seizures, and fainting/blackouts. Musculoskeletal       Denies muscle pain, joint pain, joint stiffness, decreased range of motion, redness, swelling, muscle weakness, and gout.  Skin       Denies bruising, unusual mles/lumps or sores, and hair/skin or nail changes.  Psych        Denies mood changes, temper/anger issues, anxiety/stress, speech problems, depression, and sleep problems.  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  Was on Tamoxifem for 5 yrs. Years later found a LN under her left rib. Started chem in 2007. In 2008 saw Dr. Edwyna Shell and removed the LN.  Last chemo middle of 2008.   Oncology:  Dr. Darrold Span at [facility].  DDD: MRI Lumbar 05/2006 - Sees Dr. Sheran Luz Ophtho- Dr. August Luz ENT: Dr. Narda Bonds: Hx of recurrent sinusitis Hyperlipidemia  Past Surgical History: Reviewed history from 12/20/2007 and no changes required. Complete hysterectomy and bladder sling in 2005 - On Macrobid daily.  Left Breast lumpectomy 1999 Cancerous LN remoeved 2008  Family History: Reviewed history from 12/20/2007 and no changes required. 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  Never Smoked Alcohol use-no Drug use-no   Objective:  No acute distress  Eyes:  Pupils are equal, round, and reactive to light and accomdation.  Extraocular movement is intact.  Conjunctivae are not inflamed.  Mouth:  No lesions Pharynx:  Normal  Neck:  Supple.  No adenopathy is present.  No thyromegaly is present  Lungs:  Clear to auscultation.  Breath sounds are equal.  Heart:  Regular rate and rhythm without murmurs, rubs, or gallops.  Abdomen:  Nontender without masses or hepatosplenomegaly.  Bowel sounds are present.  No CVA or flank tenderness.  urinalysis (dipstick):  trace blood Assessment New Problems: FREQUENCY, URINARY (ICD-788.41) HYPERLIPIDEMIA (ICD-272.4)  No evidence UTI   Plan New Orders: Urinalysis [81003-65000] T-Culture, Urine [id number] Est. Patient Level III [16109] Planning Comments:   Continue increased fluids.  Urine culture pending  (treat if positive)   The patient and/or caregiver has been counseled thoroughly with regard to medications prescribed including dosage, schedule, interactions, rationale for use, and possible side effects and they verbalize understanding.  Diagnoses and expected course of recovery discussed and will return if not improved as expected or if the condition worsens. Patient and/or caregiver verbalized understanding.   Orders Added: 1)  Urinalysis [81003-65000] 2)  T-Culture, Urine [[id number]] 3)  Est. Patient Level III [91478]  Laboratory Results   Urine Tests  Date/Time Received: March 01, 2010 1:37 PM  Date/Time Reported: March 01, 2010 1:37 PM   Routine Urinalysis   Color: lt. yellow Appearance: Clear Glucose: negative   (Normal Range: Negative) Bilirubin: negative   (  Normal Range: Negative) Ketone: negative   (Normal Range: Negative) Spec. Gravity: 1.010   (Normal Range: 1.003-1.035) Blood: trace-intact   (Normal Range: Negative) pH: 7.0   (Normal Range: 5.0-8.0) Protein: negative   (Normal Range: Negative) Urobilinogen: 0.2   (Normal Range: 0-1) Nitrite: negative   (Normal Range: Negative) Leukocyte Esterace: negative   (Normal Range: Negative)

## 2010-09-30 NOTE — Letter (Signed)
Summary: [facility]  [facility]   Imported By: Lanelle Bal 03/29/2010 11:56:21  _____________________________________________________________________  External Attachment:    Type:   Image     Comment:   External Document

## 2010-09-30 NOTE — Progress Notes (Signed)
Summary: refill lorazopam  Phone Note Call from Patient   Caller: Patient Summary of Call: lorazopam refill - pharmacy never rec;d auth to refill . KIK Initial call taken by: Duard Brady LPN,  March 02, 8118 5:41 PM    Filled on 02-26-2010.  Seymour Bars, D.O.

## 2010-09-30 NOTE — Letter (Signed)
[facility]   Imported By: Lanelle Bal 04/26/2010 10:52:14  _____________________________________________________________________  External Attachment:    Type:   Image     Comment:   External Document

## 2010-09-30 NOTE — Letter (Signed)
Summary: [facility]  [facility]   Imported By: Lanelle Bal 09/17/2009 11:04:55  _____________________________________________________________________  External Attachment:    Type:   Image     Comment:   External Document

## 2010-09-30 NOTE — Letter (Signed)
Summary: [facility]   Imported By: Lanelle Bal 09/23/2010 09:44:44  _____________________________________________________________________  External Attachment:    Type:   Image     Comment:   External Document

## 2010-09-30 NOTE — Progress Notes (Signed)
Summary: Itching & burning eyes  Phone Note Call from Patient Call back at Home Phone [phone]   Caller: Patient Call For: [name] MD Reason for Call: Talk to Nurse Summary of Call: Pt said she is using her meds for thrush but is now experiencing itching/burning in her eyes. Pls call her. Initial call taken by: Lannette Donath,  November 26, 2009 10:29 AM  Follow-up for Phone Call        This patient will need to be seen- acute can put on Dr. Misty Stanley schedule this afternoon or put on Dr. Norman Herrlich schedule tomorrow. Follow-up by: Kathlene November,  November 26, 2009 10:41 AM

## 2010-09-30 NOTE — Assessment & Plan Note (Signed)
Summary: BREASTPain   Vital Signs:  Patient profile:   65 year old female Height:      65.4 inches Weight:      130 pounds Pulse rate:   130 / minute BP sitting:   122 / 76  (right arm) Cuff size:   regular  Vitals Entered By: Avon Gully CMA, Duncan Dull) (July 12, 2010 2:12 PM) CC: sharp pain in the left nipple    Primary Care Provider:  Linford Arnold,   CC:  sharp pain in the left nipple .  History of Present Illness: sharp pain in the left nipple.  Started 2-3 days ago. No rash or redness. No inc warmth. Yesterday pain were more frequent.  No hurting around the nipple.  Used some neosporin adn a small pad on it. Last mammogram in July and it was normal.  Left breast does feel more full than usual.   Current Medications (verified): 1)  Femara 2.5 Mg  Tabs (Letrozole) .... Take One Tablet By Mouth Once A Day 2)  Alphagan P 0.1 %  Soln (Brimonidine Tartrate) .... Take One Tablet By Mouth Three Timews A Day 3)  Macrobid 100 Mg  Caps (Nitrofurantoin Monohyd Macro) .... Take One Tablet By Mouth Once A Day 4)  Calcium 1250 Mg  Tabs (Calcium Carbonate) .... Take One Tablet By Mouth Once A Ay 5)  Vitamin D 1000 Unit  Tabs (Cholecalciferol) .... Take One Tablet By Mouth Once A Day 6)  Lorazepam 1 Mg  Tabs (Lorazepam) .... Take One Tablet By Mouth Once A Day 7)  Fish Oil 1000 Mg  Caps (Omega-3 Fatty Acids) .... 2 Daily 8)  Simvastatin 40 Mg Tabs (Simvastatin) .Marland Kitchen.. 1 Tab By Mouth Qhs 9)  Mobic 7.5 Mg Tabs (Meloxicam) .... Take 1 Tablet By Mouth Once A Day 10)  Cyclobenzaprine Hcl 10 Mg Tabs (Cyclobenzaprine Hcl) .... Take 1 Tablet By Mouth Once A Day At Bedtime As Needed Back Spasms 11)  Patanase 0.6 % Soln (Olopatadine Hcl) .... Spray 2 Sprays Each Nostril Twice A Day 12)  Xyzal 5 Mg Tabs (Levocetirizine Dihydrochloride) .... Take 1 Tablet By Mouth Once A Day 13)  Singulair 10 Mg Tabs (Montelukast Sodium) 14)  Omnaris 50 Mcg/act Susp (Ciclesonide) .... 2 Spray in Each Nostril 15)   Hydrocodone-Acetaminophen 5-500 Mg Tabs (Hydrocodone-Acetaminophen) .... Take 1 Tablet By Mouth Two Times A Day As Needed 16)  Tramadol Hcl 50 Mg Tabs (Tramadol Hcl) .... Take 1 Tablet By Mouth Three Times A Day As Needed For Pain 17)  Omeprazole 20 Mg Cpdr (Omeprazole) .... Take 1 Tablet By Mouth Once A Day 18)  Pataday 0.2 % Soln (Olopatadine Hcl) .... One Drop in Each Eye Daily. 19)  Meclizine Hcl 25 Mg Tabs (Meclizine Hcl) .... One By Mouth Up To 2-3 X A Day For Vertigo. 20)  Alendronate Sodium 70 Mg  Tabs (Alendronate Sodium) .Marland Kitchen.. 1 By Mouth Q Week . Take With 8 Ounces of Water On and Empty Stomach  Allergies (verified): 1)  ! Duricef  Comments:  Nurse/Medical Assistant: The patient's medications and allergies were reviewed with the patient and were updated in the Medication and Allergy Lists. Avon Gully CMA, Duncan Dull) (July 12, 2010 2:13 PM)  Past History:  Past Surgical History: Last updated: 12/20/2007 Complete hysterectomy and bladder sling in 2005 - On Macrobid daily.  Left Breast lumpectomy 1999 Cancerous LN remoeved 2008  Physical Exam  General:  Well-developed,well-nourished,in no acute distress; alert,appropriate and cooperative throughout examination Breasts:  Left brestt  with nodularity about  and  inch above her incision under teh edge of teh nipple.  She is very tender there and alon the outer lateral edge of the nipple. no discharge or redness or axillary LN.     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  I really do not think that this is infection but I will call him put her on antibiotic.  If she does not notice any improvement in the next 48 hours and she can discontinue the antibiotic.  Fortunately she does have an appointment with her oncologist on Friday.  I did call and ask to speak with her oncologist Dr. Darrold Span.  We discussed going  ahead and referring her for mammography and further evaluation at the breast Center in [place].  She did have a normal mammography and July.  she can certainly use Tylenol on p.r.n. for pain or discomfort.  And wear good supportive bra. Orders: T-Mammography, Diagnostic (unilateral) ([id number])  Complete Medication List: 1)  Femara 2.5 Mg Tabs (Letrozole) .... Take one tablet by mouth once a day 2)  Alphagan P 0.1 % Soln (Brimonidine tartrate) .... Take one tablet by mouth three timews a day 3)  Macrobid 100 Mg Caps (Nitrofurantoin monohyd macro) .... Take one tablet by mouth once a day 4)  Calcium 1250 Mg Tabs (Calcium carbonate) .... Take one tablet by mouth once a ay 5)  Vitamin D 1000 Unit Tabs (Cholecalciferol) .... Take one tablet by mouth once a day 6)  Lorazepam 1 Mg Tabs (Lorazepam) .... Take one tablet by mouth once a day 7)  Fish Oil 1000 Mg Caps (Omega-3 fatty acids) .... 2 daily 8)  Simvastatin 40 Mg Tabs (Simvastatin) .Marland Kitchen.. 1 tab by mouth qhs 9)  Mobic 7.5 Mg Tabs (Meloxicam) .... Take 1 tablet by mouth once a day 10)  Cyclobenzaprine Hcl 10 Mg Tabs (Cyclobenzaprine hcl) .... Take 1 tablet by mouth once a day at bedtime as needed back spasms 11)  Patanase 0.6 % Soln (Olopatadine hcl) .... Spray 2 sprays each nostril twice a day 12)  Xyzal 5 Mg Tabs (Levocetirizine dihydrochloride) .... Take 1 tablet by mouth once a day 13)  Singulair 10 Mg Tabs (Montelukast sodium) 14)  Omnaris 50 Mcg/act Susp (Ciclesonide) .... 2 spray in each nostril 15)  Hydrocodone-acetaminophen 5-500 Mg Tabs (Hydrocodone-acetaminophen) .... Take 1 tablet by mouth two times a day as needed 16)  Tramadol Hcl 50 Mg Tabs (Tramadol hcl) .... Take 1 tablet by mouth three times a day as needed for pain 17)  Omeprazole 20 Mg Cpdr (Omeprazole) .... Take 1 tablet by mouth once a day 18)  Pataday 0.2 % Soln (Olopatadine hcl) .... One drop in each eye daily. 19)  Meclizine Hcl 25 Mg Tabs (Meclizine hcl) .... One by mouth  up to 2-3 x a day for vertigo. 20)  Alendronate Sodium 70 Mg Tabs (Alendronate sodium) .Marland Kitchen.. 1 by mouth q week . take with 8 ounces of water on and empty stomach 21)  Sulfamethoxazole-tmp Ds 800-160 Mg Tabs (Sulfamethoxazole-trimethoprim) .... Take 1 tablet by mouth two times a day for 10 days  Patient Instructions: 1)  Hold macrobid and start the Bactrim two times a day for 10 days. Then can restart the macrobid.  Stop if causes any side effects and call the office.  2)  We will try to call Dr. Precious Reel office.  3)  I do think she needs further evaluation at the [facility] in [place].   Prescriptions: SULFAMETHOXAZOLE-TMP DS 800-160 MG TABS (SULFAMETHOXAZOLE-TRIMETHOPRIM) Take 1 tablet by mouth two times a day for 10 days  #20 x 0   Entered and Authorized by:   [name] MD   Signed by:   [name] MD on 07/12/2010   Method used:   Electronically to        [facility]  [id number] (retail)       [address]       [place], [place]  [address]       Ph: [phone]       Fax: [phone]   RxID:   [id number]    Orders Added: 1)  T-Mammography, Diagnostic (unilateral) [77055] 2)  Est. Patient Level IV [84132]

## 2010-09-30 NOTE — Progress Notes (Signed)
  Phone Note From Pharmacy   Caller: greg  [facility]  [phone] Summary of Call: pt is there at pharmacy to fill omeprazole and says she was told when in office to take 2 for eight weeks and that is not what rx says  please advise Initial call taken by: Kandice Hams,  October 07, 2009 11:24 AM  Follow-up for Phone Call        called back per Dr Linford Arnold, pt was told to finished what she has take two times a day, then start on rx .Kandice Hams  October 07, 2009 11:38 AM  Follow-up by: Kandice Hams,  October 07, 2009 11:38 AM

## 2010-09-30 NOTE — Progress Notes (Signed)
Summary: med question and Rx. request  Phone Note Call from Patient Call back at Home Phone [phone]   Caller: Patient Call For: [name] MD Summary of Call: Pt. states that Dr.Metheney has her on Lipitor and the medicine is getting more expensive and her Metheney had discussed her going on a generic and now she would like to go on a Generic. Pt does not want Simvastin because pt. has tried already and Metheney told pt it was not working as well as the Lipitor so now she would like to try another generic. Please call patient and let her know the status. Thanks,.Jennifer Rich  Jan 26, 2010 9:50 AM  Initial call taken by: [name],  Jan 26, 2010 9:51 AM  Follow-up for Phone Call        Of the generic statins, Simvastatin is the strongest so if she did OK on it, that would be the substitute.   Follow-up by: Seymour Bars DO,  Jan 26, 2010 10:06 AM  Additional Follow-up for Phone Call Additional follow up Details #1::        Pt would like to changed to simvastatin and Rx sent to pharm.  Additional Follow-up by: Payton Spark CMA,  Jan 26, 2010 3:16 PM    New/Updated Medications: SIMVASTATIN 40 MG TABS (SIMVASTATIN) 1 tab by mouth qhs Prescriptions: SIMVASTATIN 40 MG TABS (SIMVASTATIN) 1 tab by mouth qhs  #90 x 0   Entered and Authorized by:   Seymour Bars DO   Signed by:   Seymour Bars DO on 01/26/2010   Method used:   Electronically to        [facility]  [id number] (retail)       [address]       [place], [place]  [address]       Ph: [phone]       Fax: [phone]   RxID:   [id number]

## 2010-09-30 NOTE — Letter (Signed)
Summary: Newman Pies MD  Newman Pies MD   Imported By: Lanelle Bal 10/14/2009 10:59:18  _____________________________________________________________________  External Attachment:    Type:   Image     Comment:   External Document

## 2010-09-30 NOTE — Progress Notes (Signed)
Summary: Wants Prednisone  Phone Note Call from Patient Call back at Home Phone [phone]   Caller: Patient Call For: [name] MD Summary of Call: Pt wants to know if you would call her in 6 days of Prednisone- still has nasal and sinus congestion, pain in ears, pressure in cheek. Uses [facility] [place] Initial call taken by: Kathlene November,  October 26, 2009 10:06 AM  Follow-up for Phone Call        OK.  Keep ENT appt.  Follow-up by: [name] MD,  October 26, 2009 10:39 AM    New/Updated Medications: PREDNISONE 20 MG TABS (PREDNISONE) 2 tabs by mouth daily for 5 days Prescriptions: PREDNISONE 20 MG TABS (PREDNISONE) 2 tabs by mouth daily for 5 days  #10 x 0   Entered and Authorized by:   [name] MD   Signed by:   [name] MD on 10/26/2009   Method used:   Electronically to        [facility]  [id number] (retail)       [address]       [place], [place]  [address]       Ph: [phone]       Fax: [phone]   RxID:   [id number]

## 2010-09-30 NOTE — Letter (Signed)
Summary: [facility]  [facility]   Imported By: Lanelle Bal 10/02/2009 09:08:33  _____________________________________________________________________  External Attachment:    Type:   Image     Comment:   External Document

## 2010-09-30 NOTE — Progress Notes (Signed)
Summary: Wants Nystatin  Phone Note Call from Patient   Summary of Call: Having white areas in mouth- took Diflucan 2 pills- white is better- but still having the burning on tongue and throat. Has used Nystatin in the past ans wanted to know if you would call this in for her. Uses [facility] [place] Initial call taken by: Kathlene November,  November 24, 2009 9:08 AM  Follow-up for Phone Call        OK, will call in. If not better in one week then f/u for appt.  Follow-up by: [name] MD,  November 24, 2009 9:27 AM    New/Updated Medications: NYSTATIN 100000 UNIT/ML SUSP (NYSTATIN) 6ml po qid for 5 days  retain and swish in mouth for as long as possible before swallowing. for Prescriptions: NYSTATIN 100000 UNIT/ML SUSP (NYSTATIN) 6ml po qid for 5 days  retain and swish in mouth for as long as possible before swallowing. for  #120 ml x 0   Entered and Authorized by:   [name] MD   Signed by:   [name] MD on 11/24/2009   Method used:   Electronically to        [facility]  [address] [phone]* (retail)       [address]       [place], [place]  [address]       Ph: [phone]       Fax: [phone]   RxID:   [id number]

## 2010-09-30 NOTE — Letter (Signed)
Summary: [facility]  [facility]   Imported By: Lanelle Bal 01/13/2010 14:04:29  _____________________________________________________________________  External Attachment:    Type:   Image     Comment:   External Document

## 2010-09-30 NOTE — Assessment & Plan Note (Signed)
Summary: colonoscopy   Last Colonoscopy:  Unknown (08/29/2004 12:28:44 PM) Colonoscopy Result Date:  02/08/2010 Colonoscopy Result:  abnormal Colonoscopy Next Due:  5 yr PAP Next Due:  Not Indicated Last Mammogram:  ASSESSMENT: Negative - BI-RADS 1^MM DIGITAL SCREENING (02/26/2010 11:20:00 AM) Mammogram Next Due:  1 yr

## 2010-09-30 NOTE — Assessment & Plan Note (Signed)
Summary: RIGHT ARM YELLOW JACKET STING ARM IS SWOLLEN AND INFLAMED   Vital Signs:  Patient Profile:   65 Years Old Female CC:      redness/swelling to right FA return post bee sting 12 days ago Height:     65.4 inches Weight:      133 pounds O2 Sat:      97 % O2 treatment:    Room Air Temp:     98.4 degrees F oral Pulse rate:   107 / minute Resp:     14 per minute BP sitting:   126 / 80  (right arm) Cuff size:   regular  Pt. in pain?   no  Vitals Entered By: Lajean Saver RN (April 23, 2010 1:56 PM)                   Updated Prior Medication List: FEMARA 2.5 MG  TABS (LETROZOLE) Take one tablet by mouth once a day ALPHAGAN P 0.1 %  SOLN (BRIMONIDINE TARTRATE) Take one tablet by mouth three timews a day MACROBID 100 MG  CAPS (NITROFURANTOIN MONOHYD MACRO) Take one tablet by mouth once a day CALCIUM 1250 MG  TABS (CALCIUM CARBONATE) Take one tablet by mouth once a ay VITAMIN D 1000 UNIT  TABS (CHOLECALCIFEROL) Take one tablet by mouth once a day LORAZEPAM 1 MG  TABS (LORAZEPAM) Take one tablet by mouth once a day FISH OIL 1000 MG  CAPS (OMEGA-3 FATTY ACIDS) 2 daily SIMVASTATIN 40 MG TABS (SIMVASTATIN) 1 tab by mouth qhs MOBIC 7.5 MG TABS (MELOXICAM) Take 1 tablet by mouth once a day CYCLOBENZAPRINE HCL 10 MG TABS (CYCLOBENZAPRINE HCL) Take 1 tablet by mouth once a day at bedtime as needed back spasms PATANASE 0.6 % SOLN (OLOPATADINE HCL) Spray 2 sprays each nostril twice a day XYZAL 5 MG TABS (LEVOCETIRIZINE DIHYDROCHLORIDE) Take 1 tablet by mouth once a day SINGULAIR 10 MG TABS (MONTELUKAST SODIUM)  OMNARIS 50 MCG/ACT SUSP (CICLESONIDE) 2 spray in each nostril HYDROCODONE-ACETAMINOPHEN 5-500 MG TABS (HYDROCODONE-ACETAMINOPHEN) Take 1 tablet by mouth two times a day as needed TRAMADOL HCL 50 MG TABS (TRAMADOL HCL) Take 1 tablet by mouth three times a day as needed for pain OMEPRAZOLE 20 MG CPDR (OMEPRAZOLE) Take 1 tablet by mouth once a day PATADAY 0.2 % SOLN (OLOPATADINE  HCL) one drop in each eye daily. MECLIZINE HCL 25 MG TABS (MECLIZINE HCL) one by mouth up to 2-3 x a day for vertigo.  Current Allergies (reviewed today): ! DURICEFHistory of Present Illness Chief Complaint: redness/swelling to right FA return post bee sting 12 days ago History of Present Illness: Stung by a yellow jacket 12 days ago.  Initially had redness, swelling, pain that gradually decreased over a week.  But last night the symptoms began again at the same site (R forearm).  No F/C/N/V.  She is concerned with an infection.  She doesn't recall any further stings or trauma.  Benedryl helps.  REVIEW OF SYSTEMS Constitutional Symptoms      Denies fever, chills, night sweats, weight loss, weight gain, and fatigue.  Eyes       Denies change in vision, eye pain, eye discharge, glasses, contact lenses, and eye surgery. Ear/Nose/Throat/Mouth       Denies hearing loss/aids, change in hearing, ear pain, ear discharge, dizziness, frequent runny nose, frequent nose bleeds, sinus problems, sore throat, hoarseness, and tooth pain or bleeding.  Respiratory       Denies dry cough, productive cough, wheezing, shortness of  breath, asthma, bronchitis, and emphysema/COPD.  Cardiovascular       Denies murmurs, chest pain, and tires easily with exhertion.    Gastrointestinal       Denies stomach pain, nausea/vomiting, diarrhea, constipation, blood in bowel movements, and indigestion. Genitourniary       Denies painful urination, kidney stones, and loss of urinary control. Neurological       Denies paralysis, seizures, and fainting/blackouts. Musculoskeletal       Complains of redness and swelling.      Denies muscle pain, joint pain, joint stiffness, decreased range of motion, muscle weakness, and gout.      Comments: right FA Skin       Complains of hair/skni or nail changes.      Denies bruising and unusual mles/lumps or sores.  Psych       Denies mood changes, temper/anger issues, anxiety/stress,  speech problems, depression, and sleep problems. Other Comments: Patient was stung by a yellow jacket 12 days ago. Her skin healed but last night the redness, swelling, heat and itching returned to the area where she was stung   Past History:  Past Medical History: In 1999 had br ca in left and had a lumpectomy and chemotherapy.  Was on Tamoxifem for 5 yrs. Years later found a LN under her left rib. Started chem in 2007. In 2008 saw Dr. Edwyna Shell and removed the LN.  Last chemo middle of 2008.   Oncology:  Dr. Darrold Span at [facility]  DDD: MRI Lumbar 05/2006 - Sees Dr. Sheran Luz Ophtho- Dr. August Luz ENT: Dr. Narda Bonds: Hx of recurrent sinusitis Hyperlipidemia trigeminal neuralgia  Past Surgical History: Reviewed history from 12/20/2007 and no changes required. Complete hysterectomy and bladder sling in 2005 - On Macrobid daily.  Left Breast lumpectomy 1999 Cancerous LN remoeved 2008  Family History: Reviewed history from 12/20/2007 and no changes required. 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  Never Smoked Alcohol use-no Drug use-no Physical Exam General appearance: well developed, well nourished, no acute distress Chest/Lungs: no rales, wheezes, or rhonchi bilateral, breath sounds equal without effort Heart: regular rate and  rhythm, no murmur Skin: Right forearm with about 10x4cm erythema, no induration or fluctuance, minor warmth, no tenderness, well-demarcated Assessment New Problems: CELLULITIS AND ABSCESS OF UPPER ARM AND FOREARM (ICD-682.3)  cellulitis of R forearm  Plan New Medications/Changes: BACTRIM DS 800-160 MG TABS (SULFAMETHOXAZOLE-TRIMETHOPRIM) 1 tab by mouth two times a day for 10 days  #20 x 0, 04/23/2010, Hoyt Koch MD  New Orders: Est. Patient Level II [phone] Planning Comments:   Use  the Triamcinolone that you have at your house twice a day May also use Benedryl by mouth or topical Cool compresses If getting worse, if fever, if redness continues to spread, Follow-up with your primary care physician   The patient and/or caregiver has been counseled thoroughly with regard to medications prescribed including dosage, schedule, interactions, rationale for use, and possible side effects and they verbalize understanding.  Diagnoses and expected course of recovery discussed and will return if not improved as expected or if the condition worsens. Patient and/or caregiver verbalized understanding.  Prescriptions: BACTRIM DS 800-160 MG TABS (SULFAMETHOXAZOLE-TRIMETHOPRIM) 1 tab by mouth two times a day for 10 days  #20 x 0   Entered and Authorized by:   Hoyt Koch MD   Signed by:  Hoyt Koch MD on 04/23/2010   Method used:   Print then Give to Patient   RxID:   [id number]   Orders Added: 1)  Est. Patient Level II [14782]

## 2010-09-30 NOTE — Assessment & Plan Note (Signed)
Summary: Nauseated & dizzy   Vital Signs:  Patient profile:   65 year old female Height:      65.4 inches Weight:      130 pounds O2 Sat:      97 % on Room air Temp:     98.6 degrees F oral Pulse rate:   125 / minute BP sitting:   124 / 73  (left arm) Cuff size:   large  Vitals Entered By: Kathlene November (December 24, 2009 1:15 PM)  O2 Flow:  Room air CC: dizziness, roaring noise in right ear   Primary Care Provider:  Linford Arnold,   CC:  dizziness and roaring noise in right ear.  History of Present Illness: dizziness, roaring noise in right ear.    used her diazepam for vertigo about 2 weeks ago when had a severe vertigo spell.  Felt better the next day. Woke up feeling dizzy again. Has also been using her meclizine.  Has appt wtih Neurology on Monday for her vertigo. Says the diazepam works the best.  Floreen Comber having pain on the left side of face.  Has also noticed pressure and fullness in the right ear and the roaring has gotten louder. Still on mucinex-D.    Current Medications (verified): 1)  Femara 2.5 Mg  Tabs (Letrozole) .... Take One Tablet By Mouth Once A Day 2)  Alphagan P 0.1 %  Soln (Brimonidine Tartrate) .... Take One Tablet By Mouth Three Timews A Day 3)  Macrobid 100 Mg  Caps (Nitrofurantoin Monohyd Macro) .... Take One Tablet By Mouth Once A Day 4)  Alendronate Sodium 70 Mg  Tabs (Alendronate Sodium) .... Take 1 Tablet By Mouth Once A Week 5)  Calcium 1250 Mg  Tabs (Calcium Carbonate) .... Take One Tablet By Mouth Once A Ay 6)  Vitamin D 1000 Unit  Tabs (Cholecalciferol) .... Take One Tablet By Mouth Once A Day 7)  Lorazepam 1 Mg  Tabs (Lorazepam) .... Take One Tablet By Mouth Once A Day 8)  Fish Oil 1000 Mg  Caps (Omega-3 Fatty Acids) .... 2 Daily 9)  Lipitor 40 Mg Tabs (Atorvastatin Calcium) .... Take 1 Tablet By Mouth Once A Day At Bedtime 10)  Mobic 7.5 Mg Tabs (Meloxicam) .... Take 1 Tablet By Mouth Once A Day 11)  Cyclobenzaprine Hcl 10 Mg Tabs (Cyclobenzaprine Hcl)  .... Take 1 Tablet By Mouth Once A Day At Bedtime As Needed Back Spasms 12)  Patanase 0.6 % Soln (Olopatadine Hcl) .... Spray 2 Sprays Each Nostril Twice A Day 13)  Xyzal 5 Mg Tabs (Levocetirizine Dihydrochloride) .... Take 1 Tablet By Mouth Once A Day 14)  Singulair 10 Mg Tabs (Montelukast Sodium) 15)  Omnaris 50 Mcg/act Susp (Ciclesonide) .... 2 Spray in Each Nostril 16)  Hydrocodone-Acetaminophen 5-500 Mg Tabs (Hydrocodone-Acetaminophen) .... Take 1 Tablet By Mouth Two Times A Day As Needed 17)  Tramadol Hcl 50 Mg Tabs (Tramadol Hcl) .... Take 1 Tablet By Mouth Three Times A Day As Needed For Pain 18)  Omeprazole 20 Mg Cpdr (Omeprazole) .... Take 1 Tablet By Mouth Once A Day 19)  Nystatin 100000 Unit/ml Susp (Nystatin) .... 6ml Po Qid For 5 Days  Retain and Swish in Mouth For As Long As Possible Before Swallowing. For 20)  Pataday 0.2 % Soln (Olopatadine Hcl) .... One Drop in Each Eye Daily.  Allergies (verified): 1)  ! Duricef  Comments:  Nurse/Medical Assistant: The patient's medications and allergies were reviewed with the patient and were updated in  the Medication and Allergy Lists. Kathlene November (December 24, 2009 1:16 PM)  Physical Exam  General:  Well-developed,well-nourished,in no acute distress; alert,appropriate and cooperative throughout examination Head:  Normocephalic and atraumatic without obvious abnormalities. No apparent alopecia or balding. Eyes:  No corneal or conjunctival inflammation noted. EOMI. Perrla.  Ears:  External ear exam shows no significant lesions or deformities.  Otoscopic examination reveals clear canals, tympanic membranes are intact bilaterally without bulging, retraction, inflammation or discharge. Hearing is grossly normal bilaterally. Nose:  External nasal examination shows no deformity or inflammation. Nasal mucosa are pink and moist without lesions or exudates. Mouth:  Oral mucosa and oropharynx without lesions or exudates.  Teeth in good  repair. Neck:  No deformities, masses, or tenderness noted. Lungs:  Normal respiratory effort, chest expands symmetrically. Lungs are clear to auscultation, no crackles or wheezes. Heart:  Normal rate and regular rhythm. S1 and S2 normal without gallop, murmur, click, rub or other extra sounds. Skin:  no rashes.   Cervical Nodes:  No lymphadenopathy noted Psych:  Cognition and judgment appear intact. Alert and cooperative with normal attention span and concentration. No apparent delusions, illusions, hallucinations   Impression & Recommendations:  Problem # 1:  ALLERGIC CONJUNCTIVITIS (ICD-372.14) Doing well on pataday. Refilled. Sample given.   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  Work on keeping allergies and congestion under control. See neurology on Monday. I will look out for his note.   Problem # 3:  HYPERLIPIDEMIA, MIXED (ICD-272.2) Doing well on Lipitor. Due to recheck labs in 6-8 weeks. Given lab slip today.  Her updated medication list for this problem includes:    Lipitor 40 Mg Tabs (Atorvastatin calcium) .Marland Kitchen... Take 1 tablet by mouth once a day at bedtime  Orders: T-Comprehensive Metabolic Panel [id number]) T-Lipid Profile ([id number])  Problem # 4:  VERTIGO (ICD-780.4) Did refill her meclizine and her diazepam for more severe sxs.  Her updated medication list for this problem includes:    Xyzal 5 Mg Tabs (Levocetirizine dihydrochloride) .Marland Kitchen... Take 1 tablet by mouth once a day    Meclizine Hcl 25 Mg Tabs (Meclizine hcl) ..... One by mouth up to 2-3 x a day for vertigo.  Complete Medication List: 1)  Femara 2.5 Mg Tabs (Letrozole) .... Take one tablet by mouth once a day 2)  Alphagan P 0.1 % Soln (Brimonidine tartrate) .... Take one tablet by mouth three timews a day 3)  Macrobid 100 Mg Caps (Nitrofurantoin monohyd macro) .... Take one tablet by mouth once a day 4)  Alendronate Sodium 70 Mg Tabs (Alendronate sodium)  .... Take 1 tablet by mouth once a week 5)  Calcium 1250 Mg Tabs (Calcium carbonate) .... Take one tablet by mouth once a ay 6)  Vitamin D 1000 Unit Tabs (Cholecalciferol) .... Take one tablet by mouth once a day 7)  Lorazepam 1 Mg Tabs (Lorazepam) .... Take one tablet by mouth once a day 8)  Fish Oil 1000 Mg Caps (Omega-3 fatty acids) .... 2 daily 9)  Lipitor 40 Mg Tabs (Atorvastatin calcium) .... Take 1 tablet by mouth once a day at bedtime 10)  Mobic 7.5 Mg Tabs (Meloxicam) .... Take 1 tablet by mouth once a day 11)  Cyclobenzaprine Hcl 10 Mg Tabs (Cyclobenzaprine hcl) .... Take 1 tablet by mouth once a day at bedtime as needed back spasms 12)  Patanase 0.6 % Soln (Olopatadine hcl) .... Spray 2 sprays each nostril twice a day 13)  Xyzal  5 Mg Tabs (Levocetirizine dihydrochloride) .... Take 1 tablet by mouth once a day 14)  Singulair 10 Mg Tabs (Montelukast sodium) 15)  Omnaris 50 Mcg/act Susp (Ciclesonide) .... 2 spray in each nostril 16)  Hydrocodone-acetaminophen 5-500 Mg Tabs (Hydrocodone-acetaminophen) .... Take 1 tablet by mouth two times a day as needed 17)  Tramadol Hcl 50 Mg Tabs (Tramadol hcl) .... Take 1 tablet by mouth three times a day as needed for pain 18)  Omeprazole 20 Mg Cpdr (Omeprazole) .... Take 1 tablet by mouth once a day 19)  Nystatin 100000 Unit/ml Susp (Nystatin) .... 6ml po qid for 5 days  retain and swish in mouth for as long as possible before swallowing. for 20)  Pataday 0.2 % Soln (Olopatadine hcl) .... One drop in each eye daily. 21)  Diazepam 2 Mg Tabs (Diazepam) .... Take 1 tablet by mouth three times a day as needed for vertigo 22)  Meclizine Hcl 25 Mg Tabs (Meclizine hcl) .... One by mouth up to 2-3 x a day for vertigo. Prescriptions: PATADAY 0.2 % SOLN (OLOPATADINE HCL) one drop in each eye daily.  #1 bottle x 1   Entered and Authorized by:   [name] MD   Signed by:   [name] MD on 12/24/2009   Method used:   Printed then faxed to  ...       [facility]  [address] [phone]* (retail)       [address]       [place], [place]  [address]       Ph: [phone]       Fax: [phone]   RxID:   [id number] MECLIZINE HCL 25 MG TABS (MECLIZINE HCL) one by mouth up to 2-3 x a day for vertigo.  #30 x 1   Entered and Authorized by:   [name] MD   Signed by:   [name] MD on 12/24/2009   Method used:   Printed then faxed to ...       [facility]  [address] [phone]* (retail)       [address]       [place], [place]  [address]       Ph: [phone]       Fax: [phone]   RxID:   [id number] DIAZEPAM 2 MG TABS (DIAZEPAM) Take 1 tablet by mouth three times a day as needed for vertigo  #30 x 0   Entered and Authorized by:   [name] MD   Signed by:   [name] MD on 12/24/2009   Method used:   Printed then faxed to ...       [facility]  [address] [phone]* (retail)       [address]       [place], [place]  [address]       Ph: [phone]       Fax: [phone]   RxID:   [id number]

## 2010-09-30 NOTE — Letter (Signed)
[facility]   Imported By: Lanelle Bal 12/12/2009 11:05:10  _____________________________________________________________________  External Attachment:    Type:   Image     Comment:   External Document

## 2010-09-30 NOTE — Progress Notes (Signed)
Summary: refill cyclobenzaprine  Phone Note Refill Request Message from:  [facility] on 03/03/10  Refills Requested: Medication #1:  CYCLOBENZAPRINE HCL 10 MG TABS Take 1 tablet by mouth once a day at bedtime as needed back spasms   Dosage confirmed as above?Dosage Confirmed   Supply Requested: 1 month   Last Refilled: 01/11/2010 Next Appointment Scheduled: no appt Initial call taken by: Mervin Kung CMA (AAMA),  March 04, 2010 8:04 AM    Prescriptions: CYCLOBENZAPRINE HCL 10 MG TABS (CYCLOBENZAPRINE HCL) Take 1 tablet by mouth once a day at bedtime as needed back spasms  #30 Tablet x 0   Entered and Authorized by:   Seymour Bars DO   Signed by:   Seymour Bars DO on 03/04/2010   Method used:   Electronically to        [facility]  [id number] (retail)       [address]       [place], [place]  [address]       Ph: [phone]       Fax: [phone]   RxID:   [id number]

## 2010-09-30 NOTE — Letter (Signed)
Summary: [facility]  [facility]   Imported By: Lanelle Bal 08/13/2010 11:29:56  _____________________________________________________________________  External Attachment:    Type:   Image     Comment:   External Document

## 2010-09-30 NOTE — Progress Notes (Signed)
Summary: meds for the flu  Phone Note Call from Patient   Caller: Patient Summary of Call: Dr.Metheny    Call Back   [phone]      [facility] [facility]  Patient grandson was tested positive for the flu, need meds called in for herself. Initial call taken by: Vanessa Swazil[place],  September 22, 2009 8:58 AM  Follow-up for Phone Call        OK for prophylaxis.  Follow-up by: [name] MD,  September 22, 2009 10:43 AM    New/Updated Medications: TAMIFLU 75 MG CAPS (OSELTAMIVIR PHOSPHATE) Take 1 tablet by mouth once a day for 10 days Prescriptions: TAMIFLU 75 MG CAPS (OSELTAMIVIR PHOSPHATE) Take 1 tablet by mouth once a day for 10 days  #10 x 0   Entered and Authorized by:   [name] MD   Signed by:   [name] MD on 09/22/2009   Method used:   Electronically to        [facility]  [id number] (retail)       [address]       [place], [place]  [address]       Ph: [phone]       Fax: [phone]   RxID:   [id number]

## 2010-09-30 NOTE — Assessment & Plan Note (Signed)
Summary: GERD   Vital Signs:  Patient profile:   65 year old female Height:      65.4 inches Weight:      131.50 pounds BMI:     21.69 Pulse rate:   108 / minute BP sitting:   123 / 73  Vitals Entered By: Kandice Hams (October 07, 2009 10:33 AM) CC: C/O A LOT OF BURPING, NO PAIN   Primary Care Provider:  Tehilla Coffel,   CC:  C/O A LOT OF BURPING and NO PAIN.  History of Present Illness: C/O A LOT OF BURPING, NO PAIN.  Eating an onion and dip iritated the burping. Took some old omeprazole and that did seem to help. No chest pain.  Felt more bloated esp last night after had a soda adn tomato soup. Gets fast HR with this as well, up to 120. Has been more constipated. Using senokot S daily.   Allergies: 1)  ! Duricef  Physical Exam  General:  Well-developed,well-nourished,in no acute distress; alert,appropriate and cooperative throughout examination Lungs:  Normal respiratory effort, chest expands symmetrically. Lungs are clear to auscultation, no crackles or wheezes. Heart:  Normal rate and regular rhythm. S1 and S2 normal without gallop, murmur, click, rub or other extra sounds. Skin:  no rashes.   Psych:  Cognition and judgment appear intact. Alert and cooperative with normal attention span and concentration. No apparent delusions, illusions, hallucinations   Impression & Recommendations:  Problem # 1:  GERD (ICD-530.81) Will restart PPI. Call if not better in one week. HR is at baseline for her. Also discussed dietrary changes. Can also add regular colcae with breakfast and lunch to improve her BMs since having some mild constipation.   Her updated medication list for this problem includes:    Omeprazole 20 Mg Cpdr (Omeprazole) .Marland Kitchen... Take 1 tablet by mouth once a day  Diagnostics Reviewed:  Discussed lifestyle modifications, diet, antacids/medications, and preventive measures. Handout provided.   Problem # 2:  BACK PAIN (ICD-724.5) Pt would like to try the tramadol for less  painful days as an alternative to the hydrocodone.   Her updated medication list for this problem includes:    Mobic 7.5 Mg Tabs (Meloxicam) .Marland Kitchen... Take 1 tablet by mouth once a day    Cyclobenzaprine Hcl 10 Mg Tabs (Cyclobenzaprine hcl) .Marland Kitchen... Take 1 tablet by mouth once a day at bedtime as needed back spasms    Hydrocodone-acetaminophen 5-500 Mg Tabs (Hydrocodone-acetaminophen) .Marland Kitchen... Take 1 tablet by mouth two times a day as needed    Tramadol Hcl 50 Mg Tabs (Tramadol hcl) .Marland Kitchen... Take 1 tablet by mouth three times a day as needed for pain  Complete Medication List: 1)  Femara 2.5 Mg Tabs (Letrozole) .... Take one tablet by mouth once a day 2)  Alphagan P 0.1 % Soln (Brimonidine tartrate) .... Take one tablet by mouth three timews a day 3)  Macrobid 100 Mg Caps (Nitrofurantoin monohyd macro) .... Take one tablet by mouth once a day 4)  Alendronate Sodium 70 Mg Tabs (Alendronate sodium) .... Take 1 tablet by mouth once a week 5)  Calcium 1250 Mg Tabs (Calcium carbonate) .... Take one tablet by mouth once a ay 6)  Vitamin D 1000 Unit Tabs (Cholecalciferol) .... Take one tablet by mouth once a day 7)  Lorazepam 1 Mg Tabs (Lorazepam) .... Take one tablet by mouth once a day 8)  Fish Oil 1000 Mg Caps (Omega-3 fatty acids) .... 2 daily 9)  Simvastatin 40 Mg Tabs (  Simvastatin) .... Take 1 tablet by mouth once a day at bedtime 10)  Mobic 7.5 Mg Tabs (Meloxicam) .... Take 1 tablet by mouth once a day 11)  Cyclobenzaprine Hcl 10 Mg Tabs (Cyclobenzaprine hcl) .... Take 1 tablet by mouth once a day at bedtime as needed back spasms 12)  Patanase 0.6 % Soln (Olopatadine hcl) .... Spray 2 sprays each nostril twice a day 13)  Xyzal 5 Mg Tabs (Levocetirizine dihydrochloride) .... Take 1 tablet by mouth once a day 14)  Singulair 10 Mg Tabs (Montelukast sodium) 15)  Omnaris 50 Mcg/act Susp (Ciclesonide) .... 2 spray in each nostril 16)  Hydrocodone-acetaminophen 5-500 Mg Tabs (Hydrocodone-acetaminophen) .... Take  1 tablet by mouth two times a day as needed 17)  Fluconazole 150 Mg Tabs (Fluconazole) .... Take 1 tablet by mouth once a day 18)  Tramadol Hcl 50 Mg Tabs (Tramadol hcl) .... Take 1 tablet by mouth three times a day as needed for pain 19)  Omeprazole 20 Mg Cpdr (Omeprazole) .... Take 1 tablet by mouth once a day Prescriptions: OMEPRAZOLE 20 MG CPDR (OMEPRAZOLE) Take 1 tablet by mouth once a day  #30 x 0   Entered and Authorized by:   [name] MD   Signed by:   [name] MD on 10/07/2009   Method used:   Electronically to        [facility]  [address] [phone]* (retail)       [address]       [place], [place]  [address]       Ph: [phone]       Fax: [phone]   RxID:   [id number] TRAMADOL HCL 50 MG TABS (TRAMADOL HCL) Take 1 tablet by mouth three times a day as needed for pain  #30 x 1   Entered and Authorized by:   [name] MD   Signed by:   [name] MD on 10/07/2009   Method used:   Electronically to        [facility]  [address] [phone]* (retail)       [address]       [place], [place]  [address]       Ph: [phone]       Fax: [phone]   RxID:   [id number]

## 2010-09-30 NOTE — Letter (Signed)
[facility]   Imported By: Lanelle Bal 09/22/2010 10:40:45  _____________________________________________________________________  External Attachment:    Type:   Image     Comment:   External Document

## 2010-09-30 NOTE — Progress Notes (Signed)
Summary: ? yeast or thrush in mouth  Phone Note Call from Patient Call back at Home Phone [phone]   Caller: Patient Call For: [name] MD Summary of Call: Burning in mouth, throat, and white tongue- has one more day of the antibiotics left. Last time was called in the mouthwash and would like this again. Please send to [facility] [address] in [place] Initial call taken by: Kathlene November LPN,  July 21, 2010 8:37 AM    New/Updated Medications: NYSTATIN 100000 UNIT/ML SUSP (NYSTATIN) 6ml by mouth four times a day for 7 days. Swish and retain in mouth for several minutes and then swallow Prescriptions: NYSTATIN 100000 UNIT/ML SUSP (NYSTATIN) 6ml by mouth four times a day for 7 days. Swish and retain in mouth for several minutes and then swallow  #192ml x 0   Entered and Authorized by:   [name] MD   Signed by:   [name] MD on 07/21/2010   Method used:   Electronically to        [facility]  [address] [phone]* (retail)       [address]       [place], [place]  [address]       Ph: [phone]       Fax: [phone]   RxID:   [id number]

## 2010-10-06 NOTE — Progress Notes (Signed)
Summary: call patine t  Phone Note Call from Patient   Summary of Call: patient was seen on 1/26 for neck pain and Sinus Infection and as her and Dr. Linford Arnold had discussed at that visit, patient has decided she would like to get a neck X-ray since she is still having trouble with her neck...Marland KitchenMarland KitchenPlease call her back at home .Marland KitchenMichaelle Copas  September 30, 2010 11:34 AM  Initial call taken by: [name],  September 30, 2010 11:34 AM  Follow-up for Phone Call        [facility] will put in order for xray.  Follow-up by: Avon Gully CMA, Duncan Dull),  September 30, 2010 11:45 AM  Additional Follow-up for Phone Call Additional follow up Details #1::        pt notified Additional Follow-up by: Avon Gully CMA, Duncan Dull),  September 30, 2010 2:29 PM

## 2010-10-07 ENCOUNTER — Encounter: Payer: Self-pay | Admitting: Family Medicine

## 2010-10-07 ENCOUNTER — Ambulatory Visit (INDEPENDENT_AMBULATORY_CARE_PROVIDER_SITE_OTHER): Payer: BC Managed Care – PPO | Admitting: Family Medicine

## 2010-10-07 ENCOUNTER — Encounter (INDEPENDENT_AMBULATORY_CARE_PROVIDER_SITE_OTHER): Payer: Self-pay | Admitting: *Deleted

## 2010-10-07 DIAGNOSIS — J019 Acute sinusitis, unspecified: Secondary | ICD-10-CM

## 2010-10-08 ENCOUNTER — Ambulatory Visit: Payer: BC Managed Care – PPO | Attending: Physical Medicine and Rehabilitation | Admitting: Physical Therapy

## 2010-10-08 DIAGNOSIS — IMO0001 Reserved for inherently not codable concepts without codable children: Secondary | ICD-10-CM | POA: Insufficient documentation

## 2010-10-08 DIAGNOSIS — M2560 Stiffness of unspecified joint, not elsewhere classified: Secondary | ICD-10-CM | POA: Insufficient documentation

## 2010-10-08 DIAGNOSIS — R293 Abnormal posture: Secondary | ICD-10-CM | POA: Insufficient documentation

## 2010-10-08 DIAGNOSIS — M542 Cervicalgia: Secondary | ICD-10-CM | POA: Insufficient documentation

## 2010-10-12 ENCOUNTER — Ambulatory Visit: Payer: BC Managed Care – PPO | Admitting: Physical Therapy

## 2010-10-14 ENCOUNTER — Encounter: Payer: BC Managed Care – PPO | Admitting: Physical Therapy

## 2010-10-14 ENCOUNTER — Ambulatory Visit: Payer: BC Managed Care – PPO | Admitting: Physical Therapy

## 2010-10-14 NOTE — Letter (Signed)
Summary: Primary Care Consult Scheduled Letter  [facility]  [address]   [place], [place] [address]   Phone: [phone]  Fax: [phone]      10/07/2010 MRN: [id number]  [name] [address] [place], [place]  [address]  [place]    Dear [name],    We have scheduled an appointment for you.  At the recommendation of Dr.Metheney, we have scheduled you a consult with Dr.Britt-,[facility] on 10-29-10 at 2:45.  Their address is [address], [place] [address]. The office phone number is [phone].  If this appointment day and time is not convenient for you, please feel free to call the office of the doctor you are being referred to at the number listed above and reschedule the appointment.     It is important for you to keep your scheduled appointments. We are here to make sure you are given good patient care.    Thank you, [name] Patient Care Coordinator [facility] Kathryne Sharper

## 2010-10-14 NOTE — Assessment & Plan Note (Signed)
Summary: 2 week f/u right ear   Vital Signs:  Patient profile:   65 year old female Height:      65.4 inches Weight:      134 pounds Pulse rate:   124 / minute BP sitting:   127 / 76  (right arm) Cuff size:   regular  Vitals Entered By: Avon Gully CMA, Duncan Dull) (October 07, 2010 1:04 PM) CC: f/u sinusitis   Primary Care Provider:  Linford Arnold,   CC:  f/u sinusitis.  History of Present Illness: F/U fliud in her right ear. I last treated her with augmentin. She completed the course. ABout a week ago she had a loud noise in her ear and tried to come in but I wasn't available. Then yesterday evening had a loud roaring as well. When stands up feels pressure in her ear and head. Used Affrin and that did help.  The is still  having post nasal drip. No fever or nasal congestion. She is still on her allergy meds and has been doing her allergy shots.   Current Medications (verified): 1)  Femara 2.5 Mg  Tabs (Letrozole) .... Take One Tablet By Mouth Once A Day 2)  Alphagan P 0.1 %  Soln (Brimonidine Tartrate) .... Take One Tablet By Mouth Three Timews A Day 3)  Calcium 1250 Mg  Tabs (Calcium Carbonate) .... Take One Tablet By Mouth Once A Ay 4)  Vitamin D 1000 Unit  Tabs (Cholecalciferol) .... Take One Tablet By Mouth Once A Day 5)  Lorazepam 1 Mg  Tabs (Lorazepam) .... Take One Tablet By Mouth Once A Day 6)  Fish Oil 1000 Mg  Caps (Omega-3 Fatty Acids) .... 2 Daily 7)  Simvastatin 40 Mg Tabs (Simvastatin) .Marland Kitchen.. 1 Tab By Mouth Qhs 8)  Mobic 7.5 Mg Tabs (Meloxicam) .... Take 1 Tablet By Mouth Once A Day 9)  Cyclobenzaprine Hcl 10 Mg Tabs (Cyclobenzaprine Hcl) .... Take 1 Tablet By Mouth Once A Day At Bedtime As Needed Back Spasms 10)  Patanase 0.6 % Soln (Olopatadine Hcl) .... Spray 2 Sprays Each Nostril Twice A Day 11)  Xyzal 5 Mg Tabs (Levocetirizine Dihydrochloride) .... Take 1 Tablet By Mouth Once A Day 12)  Singulair 10 Mg Tabs (Montelukast Sodium) 13)  Omnaris 50 Mcg/act Susp  (Ciclesonide) .... 2 Spray in Each Nostril 14)  Hydrocodone-Acetaminophen 5-500 Mg Tabs (Hydrocodone-Acetaminophen) .... Take 1 Tablet By Mouth Two Times A Day As Needed 15)  Tramadol Hcl 50 Mg Tabs (Tramadol Hcl) .... Take 1 Tablet By Mouth Three Times A Day As Needed For Pain 16)  Omeprazole 20 Mg Cpdr (Omeprazole) .... Take 1 Tablet By Mouth Once A Day 17)  Pataday 0.2 % Soln (Olopatadine Hcl) .... One Drop in Each Eye Daily. 18)  Meclizine Hcl 25 Mg Tabs (Meclizine Hcl) .... One By Mouth Up To 2-3 X A Day For Vertigo. 19)  Alendronate Sodium 70 Mg  Tabs (Alendronate Sodium) .Marland Kitchen.. 1 By Mouth Q Week . Take With 8 Ounces of Water On and Empty Stomach 20)  Augmentin 875-125 Mg Tabs (Amoxicillin-Pot Clavulanate) .... Take 1 Tablet By Mouth Two Times A Day For 14 Days  Allergies (verified): 1)  ! Duricef  Comments:  Nurse/Medical Assistant: The patient's medications and allergies were reviewed with the patient and were updated in the Medication and Allergy Lists. Avon Gully CMA, Duncan Dull) (October 07, 2010 1:04 PM)  Physical Exam  General:  Well-developed,well-nourished,in no acute distress; alert,appropriate and cooperative throughout examination Head:  Normocephalic and atraumatic without obvious abnormalities. No apparent alopecia or balding. Eyes:  No corneal or conjunctival inflammation noted. EOMI. Perrla. Ears:  External ear exam shows no significant lesions or deformities.  Otoscopic examination reveals clear canals, tympanic membranes are intact bilaterally without bulging, retraction, inflammation or discharge. Hearing is grossly normal bilaterally. Nose:  External nasal examination shows no deformity or inflammation. Mouth:  Oral mucosa and oropharynx without lesions or exudates.  Teeth in good repair. Neck:  No deformities, masses, or tenderness noted. Cervical Nodes:  No lymphadenopathy noted   Impression & Recommendations:  Problem # 1:  SINUSITIS - ACUTE-NOS  (ICD-461.9)  Today her ear has completely resolved. I discussed that since she is still having pressure and roaring in her ear and post nasal drip that I will refer her to ENT.  The following medications were removed from the medication list:    Augmentin 875-125 Mg Tabs (Amoxicillin-pot clavulanate) .Marland Kitchen... Take 1 tablet by mouth two times a day for 14 days Her updated medication list for this problem includes:    Patanase 0.6 % Soln (Olopatadine hcl) ..... Spray 2 sprays each nostril twice a day    Omnaris 50 Mcg/act Susp (Ciclesonide) .Marland Kitchen... 2 spray in each nostril  Orders: ENT Referral (ENT)  Complete Medication List: 1)  Femara 2.5 Mg Tabs (Letrozole) .... Take one tablet by mouth once a day 2)  Alphagan P 0.1 % Soln (Brimonidine tartrate) .... Take one tablet by mouth three timews a day 3)  Calcium 1250 Mg Tabs (Calcium carbonate) .... Take one tablet by mouth once a ay 4)  Vitamin D 1000 Unit Tabs (Cholecalciferol) .... Take one tablet by mouth once a day 5)  Lorazepam 1 Mg Tabs (Lorazepam) .... Take one tablet by mouth once a day 6)  Fish Oil 1000 Mg Caps (Omega-3 fatty acids) .... 2 daily 7)  Simvastatin 40 Mg Tabs (Simvastatin) .Marland Kitchen.. 1 tab by mouth qhs 8)  Mobic 7.5 Mg Tabs (Meloxicam) .... Take 1 tablet by mouth once a day 9)  Cyclobenzaprine Hcl 10 Mg Tabs (Cyclobenzaprine hcl) .... Take 1 tablet by mouth once a day at bedtime as needed back spasms 10)  Patanase 0.6 % Soln (Olopatadine hcl) .... Spray 2 sprays each nostril twice a day 11)  Xyzal 5 Mg Tabs (Levocetirizine dihydrochloride) .... Take 1 tablet by mouth once a day 12)  Singulair 10 Mg Tabs (Montelukast sodium) 13)  Omnaris 50 Mcg/act Susp (Ciclesonide) .... 2 spray in each nostril 14)  Hydrocodone-acetaminophen 5-500 Mg Tabs (Hydrocodone-acetaminophen) .... Take 1 tablet by mouth two times a day as needed 15)  Tramadol Hcl 50 Mg Tabs (Tramadol hcl) .... Take 1 tablet by mouth three times a day as needed for pain 16)   Omeprazole 20 Mg Cpdr (Omeprazole) .... Take 1 tablet by mouth once a day 17)  Pataday 0.2 % Soln (Olopatadine hcl) .... One drop in each eye daily. 18)  Meclizine Hcl 25 Mg Tabs (Meclizine hcl) .... One by mouth up to 2-3 x a day for vertigo. 19)  Alendronate Sodium 70 Mg Tabs (Alendronate sodium) .Marland Kitchen.. 1 by mouth q week . take with 8 ounces of water on and empty stomach 20)  Nystatin 100000 Unit/ml Susp (Nystatin) .... 6ml by mouth swish as long as can and then swallow qid for 7 days  Patient Instructions: 1)  We will call you with the ENT appt.  Prescriptions: NYSTATIN 100000 UNIT/ML SUSP (NYSTATIN) 6ml by mouth swish as long as can and  then swallow QID for 7 days  #170 ml x 0   Entered and Authorized by:   [name] MD   Signed by:   [name] MD on 10/07/2010   Method used:   Electronically to        [facility]  [address] [phone]* (retail)       [address]       [place], [place]  [address]       Ph: [phone]       Fax: [phone]   RxID:   [id number]    Orders Added: 1)  Est. Patient Level III [42706] 2)  ENT Referral [ENT]

## 2010-10-19 ENCOUNTER — Ambulatory Visit: Payer: BC Managed Care – PPO | Admitting: Physical Therapy

## 2010-10-21 ENCOUNTER — Ambulatory Visit: Payer: BC Managed Care – PPO | Admitting: Physical Therapy

## 2010-10-21 ENCOUNTER — Encounter: Payer: BC Managed Care – PPO | Admitting: Physical Therapy

## 2010-10-26 ENCOUNTER — Ambulatory Visit: Payer: BC Managed Care – PPO | Admitting: Physical Therapy

## 2010-10-26 NOTE — Letter (Signed)
Summary: [facility]  [facility]   Imported By: Maryln Gottron 10/18/2010 15:11:00  _____________________________________________________________________  External Attachment:    Type:   Image     Comment:   External Document

## 2010-10-28 ENCOUNTER — Ambulatory Visit: Payer: BC Managed Care – PPO | Attending: Physical Medicine and Rehabilitation | Admitting: Physical Therapy

## 2010-10-28 ENCOUNTER — Encounter: Payer: BC Managed Care – PPO | Admitting: Physical Therapy

## 2010-10-28 DIAGNOSIS — IMO0001 Reserved for inherently not codable concepts without codable children: Secondary | ICD-10-CM | POA: Insufficient documentation

## 2010-10-28 DIAGNOSIS — M542 Cervicalgia: Secondary | ICD-10-CM | POA: Insufficient documentation

## 2010-10-28 DIAGNOSIS — M2560 Stiffness of unspecified joint, not elsewhere classified: Secondary | ICD-10-CM | POA: Insufficient documentation

## 2010-10-28 DIAGNOSIS — R293 Abnormal posture: Secondary | ICD-10-CM | POA: Insufficient documentation

## 2010-11-05 ENCOUNTER — Encounter: Payer: Self-pay | Admitting: Family Medicine

## 2010-11-05 ENCOUNTER — Ambulatory Visit (INDEPENDENT_AMBULATORY_CARE_PROVIDER_SITE_OTHER): Payer: BC Managed Care – PPO | Admitting: Family Medicine

## 2010-11-05 DIAGNOSIS — N61 Mastitis without abscess: Secondary | ICD-10-CM

## 2010-11-09 ENCOUNTER — Other Ambulatory Visit: Payer: Self-pay | Admitting: Oncology

## 2010-11-09 ENCOUNTER — Encounter (HOSPITAL_BASED_OUTPATIENT_CLINIC_OR_DEPARTMENT_OTHER): Payer: BC Managed Care – PPO | Admitting: Oncology

## 2010-11-09 DIAGNOSIS — Z170 Estrogen receptor positive status [ER+]: Secondary | ICD-10-CM

## 2010-11-09 DIAGNOSIS — C50919 Malignant neoplasm of unspecified site of unspecified female breast: Secondary | ICD-10-CM

## 2010-11-09 LAB — CBC WITH DIFFERENTIAL/PLATELET
Basophils Absolute: 0 10*3/uL (ref 0.0–0.1)
EOS%: 2.7 % (ref 0.0–7.0)
Eosinophils Absolute: 0.1 10*3/uL (ref 0.0–0.5)
HGB: 13.7 g/dL (ref 11.6–15.9)
LYMPH%: 46.4 % (ref 14.0–49.7)
MCH: 31.6 pg (ref 25.1–34.0)
MCV: 91.7 fL (ref 79.5–101.0)
MONO%: 8.1 % (ref 0.0–14.0)
Platelets: 228 10*3/uL (ref 145–400)
RBC: 4.34 10*6/uL (ref 3.70–5.45)
RDW: 11.8 % (ref 11.2–14.5)

## 2010-11-09 NOTE — Assessment & Plan Note (Signed)
Summary: Raised Red Spot on Lt. Breast nipple   Vital Signs:  Patient profile:   65 year old female Height:      65.4 inches Weight:      130 pounds Pulse rate:   87 / minute BP sitting:   135 / 87  (right arm) Cuff size:   regular  Vitals Entered By: Avon Gully CMA, Duncan Dull) (November 05, 2010 2:45 PM) CC: spot on the left nipple,sore and raised, just noticed last pm   Primary Care Provider:  Linford Arnold,   CC:  spot on the left nipple, sore and raised, and just noticed last pm.  History of Present Illness: spot on the left nipple,sore and raised, just noticed last pm. NO itchy. Never had before. Had some sorness there but about a week and was using neosporin on it. this seemed to help and then last ngith felt sore again and noticed a red bump.  Has had recurrent breast pain with negative w/u.   ST for 2-3 days, HA 2-3 days, and fatigue. she exposed to mono. No fever.    Current Medications (verified): 1)  Femara 2.5 Mg  Tabs (Letrozole) .... Take One Tablet By Mouth Once A Day 2)  Alphagan P 0.1 %  Soln (Brimonidine Tartrate) .... Take One Tablet By Mouth Three Timews A Day 3)  Calcium 1250 Mg  Tabs (Calcium Carbonate) .... Take One Tablet By Mouth Once A Ay 4)  Vitamin D 1000 Unit  Tabs (Cholecalciferol) .... Take One Tablet By Mouth Once A Day 5)  Lorazepam 1 Mg  Tabs (Lorazepam) .... Take One Tablet By Mouth Once A Day 6)  Fish Oil 1000 Mg  Caps (Omega-3 Fatty Acids) .... 2 Daily 7)  Simvastatin 40 Mg Tabs (Simvastatin) .Marland Kitchen.. 1 Tab By Mouth Qhs 8)  Mobic 7.5 Mg Tabs (Meloxicam) .... Take 1 Tablet By Mouth Once A Day 9)  Cyclobenzaprine Hcl 10 Mg Tabs (Cyclobenzaprine Hcl) .... Take 1 Tablet By Mouth Once A Day At Bedtime As Needed Back Spasms 10)  Patanase 0.6 % Soln (Olopatadine Hcl) .... Spray 2 Sprays Each Nostril Twice A Day 11)  Xyzal 5 Mg Tabs (Levocetirizine Dihydrochloride) .... Take 1 Tablet By Mouth Once A Day 12)  Singulair 10 Mg Tabs (Montelukast Sodium) 13)   Omnaris 50 Mcg/act Susp (Ciclesonide) .... 2 Spray in Each Nostril 14)  Hydrocodone-Acetaminophen 5-500 Mg Tabs (Hydrocodone-Acetaminophen) .... Take 1 Tablet By Mouth Two Times A Day As Needed 15)  Tramadol Hcl 50 Mg Tabs (Tramadol Hcl) .... Take 1 Tablet By Mouth Three Times A Day As Needed For Pain 16)  Omeprazole 20 Mg Cpdr (Omeprazole) .... Take 1 Tablet By Mouth Once A Day 17)  Pataday 0.2 % Soln (Olopatadine Hcl) .... One Drop in Each Eye Daily. 18)  Meclizine Hcl 25 Mg Tabs (Meclizine Hcl) .... One By Mouth Up To 2-3 X A Day For Vertigo. 19)  Alendronate Sodium 70 Mg  Tabs (Alendronate Sodium) .Marland Kitchen.. 1 By Mouth Q Week . Take With 8 Ounces of Water On and Empty Stomach 20)  Triamterene-Hctz 37.5-25 Mg Caps (Triamterene-Hctz) .... Take One Once Daily  Allergies (verified): 1)  ! Duricef  Comments:  Nurse/Medical Assistant: The patient's medications and allergies were reviewed with the patient and were updated in the Medication and Allergy Lists. Avon Gully CMA, Duncan Dull) (November 05, 2010 2:48 PM)  Physical Exam  General:  Well-developed,well-nourished,in no acute distress; alert,appropriate and cooperative throughout examination Breasts:  Left nipple with an  raise erthematous papule on the nipple.    Impression & Recommendations:  Problem # 1:  MASTITIS (ICD-611.0) Suspect early mastitis. Will start ABX today. Keep f/u appt with Dr. Darrold Span on Monday.   Call if not better in 3-4 days.  Change to vaseline. Avoid neosporon as can get a contact dermatitis after prolonged use.   Complete Medication List: 1)  Femara 2.5 Mg Tabs (Letrozole) .... Take one tablet by mouth once a day 2)  Alphagan P 0.1 % Soln (Brimonidine tartrate) .... Take one tablet by mouth three timews a day 3)  Calcium 1250 Mg Tabs (Calcium carbonate) .... Take one tablet by mouth once a ay 4)  Vitamin D 1000 Unit Tabs (Cholecalciferol) .... Take one tablet by mouth once a day 5)  Lorazepam 1 Mg Tabs (Lorazepam)  .... Take one tablet by mouth once a day 6)  Fish Oil 1000 Mg Caps (Omega-3 fatty acids) .... 2 daily 7)  Simvastatin 40 Mg Tabs (Simvastatin) .Marland Kitchen.. 1 tab by mouth qhs 8)  Mobic 7.5 Mg Tabs (Meloxicam) .... Take 1 tablet by mouth once a day 9)  Cyclobenzaprine Hcl 10 Mg Tabs (Cyclobenzaprine hcl) .... Take 1 tablet by mouth once a day at bedtime as needed back spasms 10)  Patanase 0.6 % Soln (Olopatadine hcl) .... Spray 2 sprays each nostril twice a day 11)  Xyzal 5 Mg Tabs (Levocetirizine dihydrochloride) .... Take 1 tablet by mouth once a day 12)  Singulair 10 Mg Tabs (Montelukast sodium) 13)  Omnaris 50 Mcg/act Susp (Ciclesonide) .... 2 spray in each nostril 14)  Hydrocodone-acetaminophen 5-500 Mg Tabs (Hydrocodone-acetaminophen) .... Take 1 tablet by mouth two times a day as needed 15)  Tramadol Hcl 50 Mg Tabs (Tramadol hcl) .... Take 1 tablet by mouth three times a day as needed for pain 16)  Omeprazole 20 Mg Cpdr (Omeprazole) .... Take 1 tablet by mouth once a day 17)  Pataday 0.2 % Soln (Olopatadine hcl) .... One drop in each eye daily. 18)  Meclizine Hcl 25 Mg Tabs (Meclizine hcl) .... One by mouth up to 2-3 x a day for vertigo. 19)  Alendronate Sodium 70 Mg Tabs (Alendronate sodium) .Marland Kitchen.. 1 by mouth q week . take with 8 ounces of water on and empty stomach 20)  Triamterene-hctz 37.5-25 Mg Caps (Triamterene-hctz) .... Take one once daily 21)  Dicloxacillin Sodium 500 Mg Caps (Dicloxacillin sodium) .... Take 1 tablet by mouth four times a day for 14 days.  Patient Instructions: 1)  Put vaseline on the nipples  2)  Start your antibiotic today.  3)  Keep appt with Dr. Darrold Span on Monday.  Prescriptions: DICLOXACILLIN SODIUM 500 MG CAPS (DICLOXACILLIN SODIUM) Take 1 tablet by mouth four times a day for 14 days.  #56 x 0   Entered and Authorized by:   [name] MD   Signed by:   [name] MD on 11/05/2010   Method used:   Electronically to        [facility]  [address]  [phone]* (retail)       [address]       [place], [place]  [address]       Ph: [phone]       Fax: [phone]   RxID:   [id number]    Orders Added: 1)  Est. Patient Level III [27253]

## 2010-11-21 ENCOUNTER — Other Ambulatory Visit: Payer: Self-pay | Admitting: Family Medicine

## 2010-12-07 ENCOUNTER — Ambulatory Visit (INDEPENDENT_AMBULATORY_CARE_PROVIDER_SITE_OTHER): Payer: BC Managed Care – PPO | Admitting: Family Medicine

## 2010-12-07 DIAGNOSIS — N61 Mastitis without abscess: Secondary | ICD-10-CM

## 2010-12-07 MED ORDER — DOXYCYCLINE HYCLATE 100 MG PO CAPS: 100.0000 mg | ORAL_CAPSULE | Freq: Two times a day (BID) | ORAL | Status: AC

## 2010-12-07 NOTE — Progress Notes (Signed)
  Subjective:    Patient ID: [name], female    DOB: 1946-06-22, 65 y.o.   MRN: [id number]  HPI Still having pain in her left breast.  She started the docloxacillin and after 5 days developed a rash. Saw dr. Darrold Span and started keflex. Completed 10 days. Last wed night her nipple area became painful and went ahead and restarted keflex and has had it for 4 days total. She says the redness has improved but she says that her recovery hasn't been as quick as it was with the dicloxacillin. We also tried Bactrim back in November for breast infection that did not seem to work well for her. She has never had any drainage or pus that could be cultured.   Review of Systems     Objective:   Physical Exam    on exam on the left breast the nipple appears somewhat normal. There is no erythema or skin lesions. There is no drainage or pus. No sign of induration to indicate an abscess. She does have a well-healed scar at the base of the left breast. She is slightly tender in the nipple. The right breast appears normal.    Assessment & Plan:  Cellulitis of the breast - unclear etiology. Certainly depressed does look better today but she is on day 4 of repeat Keflex. I will try changing her to doxycycline to see if this will help. I am unsure why she gets recurrent infections in this area. Certainly I do recommend that she followup with Dr. Darrold Span. She possibly needs also to consider seeing an infectious disease doctor. There is nothing on exam to collect the culture.

## 2010-12-07 NOTE — Patient Instructions (Signed)
Complete the doxycycline. Can stop the keflex and let me know if not completely cleared in 10 days.

## 2010-12-08 ENCOUNTER — Telehealth: Payer: Self-pay | Admitting: Family Medicine

## 2010-12-08 NOTE — Telephone Encounter (Signed)
Called rx into pharm and notified pt

## 2010-12-08 NOTE — Telephone Encounter (Signed)
Patient called and states that she left a message yesterday on the nurses line at 2:00 asking for her Rx. Of Doxycillin  And it has not been called in. Please call her back and let her know the status of this [phone]

## 2010-12-08 NOTE — Telephone Encounter (Signed)
Lets just call it in. It was faxed yesterday so not sure why they didn't get it.

## 2010-12-09 ENCOUNTER — Telehealth: Payer: Self-pay | Admitting: Family Medicine

## 2010-12-09 MED ORDER — CEPHALEXIN 500 MG PO CAPS: 500.0000 mg | ORAL_CAPSULE | Freq: Four times a day (QID) | ORAL | Status: AC

## 2010-12-09 NOTE — Telephone Encounter (Signed)
Patient called advised that she thinks she had an allergic reaction to Doxycycline. Patient states she took medicine yesterday at 2 pm and around 9 pm her tongue is itching and skin. Patient request another prescription of something else called into her pharamacy

## 2010-12-09 NOTE — Telephone Encounter (Signed)
Pt called and states keflex doesn't work for her and wants something eles

## 2010-12-09 NOTE — Telephone Encounter (Signed)
Ok, sent over keflex again for 10 days.

## 2010-12-10 NOTE — Telephone Encounter (Signed)
For now will have to do the keflex. We are running out of optins that won't cross reaction with her other allergies.  She does respond to keflex just not as quickly.

## 2010-12-13 NOTE — Telephone Encounter (Signed)
Pt.notified

## 2010-12-15 ENCOUNTER — Encounter: Payer: Self-pay | Admitting: Family Medicine

## 2010-12-16 ENCOUNTER — Encounter: Payer: Self-pay | Admitting: Family Medicine

## 2010-12-17 ENCOUNTER — Encounter: Payer: Self-pay | Admitting: Family Medicine

## 2010-12-17 ENCOUNTER — Ambulatory Visit (INDEPENDENT_AMBULATORY_CARE_PROVIDER_SITE_OTHER): Payer: BC Managed Care – PPO | Admitting: Family Medicine

## 2010-12-17 ENCOUNTER — Telehealth: Payer: Self-pay | Admitting: Family Medicine

## 2010-12-17 ENCOUNTER — Ambulatory Visit
Admission: RE | Admit: 2010-12-17 | Discharge: 2010-12-17 | Disposition: A | Discharge status: Home / Self Care | Payer: BC Managed Care – PPO | Source: Ambulatory Visit | Attending: Family Medicine | Admitting: Family Medicine

## 2010-12-17 VITALS — BP 122/80 | HR 76 | Wt 132.0 lb

## 2010-12-17 DIAGNOSIS — M542 Cervicalgia: Secondary | ICD-10-CM

## 2010-12-17 DIAGNOSIS — N644 Mastodynia: Secondary | ICD-10-CM

## 2010-12-17 MED ORDER — HYDROCODONE-ACETAMINOPHEN 5-325 MG PO TABS: 1.0000 | ORAL_TABLET | Freq: Three times a day (TID) | ORAL | Status: AC | PRN
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
## 2010-12-17 NOTE — Patient Instructions (Signed)
We will call with the referrral to Physical therapy.

## 2010-12-17 NOTE — Telephone Encounter (Signed)
Consultation: Cervical x-ray with no changes or fractures. It is stable from previous exam.
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
## 2010-12-17 NOTE — Telephone Encounter (Signed)
Pt.notified

## 2010-12-17 NOTE — Assessment & Plan Note (Signed)
She is improved today I encouraged her to continue and complete her Keflex regimen. She can call if the infection recurs or see her oncologist.

## 2010-12-19 ENCOUNTER — Other Ambulatory Visit: Payer: Self-pay | Admitting: Family Medicine

## 2010-12-23 ENCOUNTER — Ambulatory Visit: Payer: BC Managed Care – PPO | Attending: Physical Medicine and Rehabilitation | Admitting: Physical Therapy

## 2010-12-23 DIAGNOSIS — R293 Abnormal posture: Secondary | ICD-10-CM | POA: Insufficient documentation

## 2010-12-23 DIAGNOSIS — M542 Cervicalgia: Secondary | ICD-10-CM | POA: Insufficient documentation

## 2010-12-23 DIAGNOSIS — IMO0001 Reserved for inherently not codable concepts without codable children: Secondary | ICD-10-CM | POA: Insufficient documentation

## 2010-12-23 DIAGNOSIS — M2560 Stiffness of unspecified joint, not elsewhere classified: Secondary | ICD-10-CM | POA: Insufficient documentation

## 2010-12-27 ENCOUNTER — Ambulatory Visit: Payer: BC Managed Care – PPO | Admitting: Physical Therapy

## 2010-12-28 ENCOUNTER — Other Ambulatory Visit: Payer: Self-pay | Admitting: Oncology

## 2010-12-28 ENCOUNTER — Encounter (HOSPITAL_BASED_OUTPATIENT_CLINIC_OR_DEPARTMENT_OTHER): Payer: BC Managed Care – PPO | Admitting: Oncology

## 2010-12-28 DIAGNOSIS — Z170 Estrogen receptor positive status [ER+]: Secondary | ICD-10-CM

## 2010-12-28 DIAGNOSIS — Z79899 Other long term (current) drug therapy: Secondary | ICD-10-CM

## 2010-12-28 DIAGNOSIS — C50919 Malignant neoplasm of unspecified site of unspecified female breast: Secondary | ICD-10-CM

## 2010-12-28 LAB — CBC WITH DIFFERENTIAL/PLATELET
BASO%: 0.7 % (ref 0.0–2.0)
Basophils Absolute: 0 10*3/uL (ref 0.0–0.1)
Eosinophils Absolute: 0.1 10*3/uL (ref 0.0–0.5)
HCT: 36.8 % (ref 34.8–46.6)
HGB: 12.6 g/dL (ref 11.6–15.9)
MONO#: 0.3 10*3/uL (ref 0.1–0.9)
NEUT%: 39.8 % (ref 38.4–76.8)
WBC: 4 10*3/uL (ref 3.9–10.3)
lymph#: 2.1 10*3/uL (ref 0.9–3.3)

## 2010-12-28 LAB — COMPREHENSIVE METABOLIC PANEL
ALT: 14 U/L (ref 0–35)
AST: 16 U/L (ref 0–37)
Chloride: 102 mEq/L (ref 96–112)
Creatinine, Ser: 0.8 mg/dL (ref 0.40–1.20)
Sodium: 143 mEq/L (ref 135–145)
Total Bilirubin: 0.3 mg/dL (ref 0.3–1.2)
Total Protein: 6 g/dL (ref 6.0–8.3)

## 2010-12-31 ENCOUNTER — Ambulatory Visit: Payer: BC Managed Care – PPO | Attending: Physical Medicine and Rehabilitation | Admitting: Physical Therapy

## 2010-12-31 ENCOUNTER — Other Ambulatory Visit: Payer: Self-pay | Admitting: Family Medicine

## 2010-12-31 DIAGNOSIS — R293 Abnormal posture: Secondary | ICD-10-CM | POA: Insufficient documentation

## 2010-12-31 DIAGNOSIS — M542 Cervicalgia: Secondary | ICD-10-CM | POA: Insufficient documentation

## 2010-12-31 DIAGNOSIS — IMO0001 Reserved for inherently not codable concepts without codable children: Secondary | ICD-10-CM | POA: Insufficient documentation

## 2010-12-31 DIAGNOSIS — M2560 Stiffness of unspecified joint, not elsewhere classified: Secondary | ICD-10-CM | POA: Insufficient documentation

## 2011-01-03 ENCOUNTER — Ambulatory Visit: Payer: BC Managed Care – PPO | Admitting: Physical Therapy

## 2011-01-06 ENCOUNTER — Ambulatory Visit (INDEPENDENT_AMBULATORY_CARE_PROVIDER_SITE_OTHER): Payer: BC Managed Care – PPO | Admitting: Family Medicine

## 2011-01-06 VITALS — BP 124/70 | HR 79 | Ht 67.0 in | Wt 135.0 lb

## 2011-01-06 DIAGNOSIS — N898 Other specified noninflammatory disorders of vagina: Secondary | ICD-10-CM

## 2011-01-06 DIAGNOSIS — M542 Cervicalgia: Secondary | ICD-10-CM

## 2011-01-06 DIAGNOSIS — L293 Anogenital pruritus, unspecified: Secondary | ICD-10-CM

## 2011-01-06 DIAGNOSIS — N390 Urinary tract infection, site not specified: Secondary | ICD-10-CM

## 2011-01-06 LAB — POCT URINALYSIS DIPSTICK
Bilirubin, UA: NEGATIVE
Blood, UA: NEGATIVE
Ketones, UA: NEGATIVE
Protein, UA: NEGATIVE
pH, UA: 6.5

## 2011-01-06 NOTE — Progress Notes (Signed)
  Subjective:    Patient ID: [name], female    DOB: 02-10-1946, 65 y.o.   MRN: [id number]  HPI Thinks has a UTI. Burning started about 5 days ago. Started her pyridium immediately and still not really helping.  She has been on ABX recently.  She is just on the macrobid.  She also feels she has a yeast infection.  Tried an OTC tx (miconazole capsule). Has vaginal itching and burning.  No fever.  No hematuria.  Some mild LBP yesterday bur not sure if her spine , since often hurts after doing luandry.  She is also here to follow-up on her neck. She is still doing PT. They did some traction at her last visit and it was really painful afterwards and was unable to sleep but she feels like overall it is helping her. She has had more pianfree nights. She is still using her pain medications.  She is mostly using Ultram and Flexeril.   Review of Systems     Objective:   Physical Exam  Constitutional: She appears well-developed and well-nourished.  Cardiovascular: Normal rate, regular rhythm and normal heart sounds.   Pulmonary/Chest: Effort normal and breath sounds normal.  Musculoskeletal:       No CVA tenderness Neck with NROM. Nontender over the spine. Mildly tender over the upper traps bilaterally.            Assessment & Plan:  UTI - her UA is negative. I will send for culture. We will call her with results most likely on Monday. She can call sooner she starts to get fever or back pain with it.  Vaginitis - will treat with diflucan and send a wet prep.

## 2011-01-06 NOTE — Assessment & Plan Note (Addendum)
Continue PT for now since it is helping her. Also offered to send her to ortho if she would like. She declined and said she would continue physical therapy for now. She does feel she has made some progress. For now she can continue the Ultram and Flexeril when necessary.

## 2011-01-07 ENCOUNTER — Ambulatory Visit: Payer: BC Managed Care – PPO | Admitting: Physical Therapy

## 2011-01-07 ENCOUNTER — Telehealth: Payer: Self-pay | Admitting: Family Medicine

## 2011-01-07 DIAGNOSIS — B379 Candidiasis, unspecified: Secondary | ICD-10-CM

## 2011-01-07 LAB — WET PREP FOR TRICH, YEAST, CLUE
Clue Cells Wet Prep HPF POC: NONE SEEN
WBC, Wet Prep HPF POC: NONE SEEN

## 2011-01-07 MED ORDER — FLUCONAZOLE 150 MG PO TABS: 150.0000 mg | ORAL_TABLET | Freq: Once | ORAL | Status: AC

## 2011-01-07 NOTE — Telephone Encounter (Signed)
Pt notified and needs a rx for the diflucan. Will send rx

## 2011-01-07 NOTE — Telephone Encounter (Signed)
Call patient: Wet prep was normal but as we discussed and she is very started an over-the-counter treatment it can sometimes change the testing. Still go ahead and take the Diflucan. If she is still symptomatic next week then we can recollect a wet prep sample.

## 2011-01-10 ENCOUNTER — Ambulatory Visit: Payer: BC Managed Care – PPO | Admitting: Physical Therapy

## 2011-01-10 ENCOUNTER — Telehealth: Payer: Self-pay | Admitting: Family Medicine

## 2011-01-10 LAB — URINE CULTURE: Colony Count: 65000

## 2011-01-10 MED ORDER — NITROFURANTOIN MACROCRYSTAL 100 MG PO CAPS: 100.0000 mg | ORAL_CAPSULE | Freq: Four times a day (QID) | ORAL | Status: AC

## 2011-01-10 NOTE — Telephone Encounter (Signed)
Pt.notified

## 2011-01-10 NOTE — Telephone Encounter (Signed)
Called patient: Urine culture was positive. Extracoronal 2 bacteria types. Both are sensitive to a drug called nitrofurantoin. I was then sent to her pharmacy.

## 2011-01-11 NOTE — Op Note (Signed)
NAMECALLAN, YONTZ                 ACCOUNT NO.:  [id number]   MEDICAL RECORD NO.:  [id number]          PATIENT TYPE:  AMB   LOCATION:  [facility]                         FACILITY:  [facility]   PHYSICIAN:  Martina Sinner, MD DATE OF BIRTH:  [birth year]   DATE OF PROCEDURE:  01/30/2007  DATE OF DISCHARGE:                               OPERATIVE REPORT   PREOPERATIVE DIAGNOSIS:  Pelvic pain.   POSTOPERATIVE DIAGNOSIS:  Pelvic pain.   SURGERY:  Cystoscopy, bladder hydrodistention, bladder installation  therapy.   [name] has vaginal shooting pains that come and go.  Her  gynecologist, Dr. Rosalio Macadamia was concerned she may be having pain from  her sling placed anteriorly.  She also deflection upright of her urinary  stream.   The patient was prepped and draped in the usual fashion given  preoperative ciprofloxacin.   21-French scope was used for examination.  Bladder mucosa and trigone  were normal.  There is no stitch foreign body or carcinoma.  I had a  good look in her bladder.  She may have had a little bit of elevation in  the proximal third of the urethra from her sling close to the bladder  neck.  I did not think there was obvious distortion of the urethral  angle, or urethral axis.  Urethra was otherwise normal.   I had a good look inside the vagina all way to the cuff.  I could  palpate her sling at the urethrovesical angle.  This is not a uncommon  finding but theoretically I suppose could be could be related to pain  syndrome.  There was always a tethering defect as opposed to soft and  supple illness on at the right urethrovesical angle.   I think it be reasonable to examine her again in the clinic and feel the  urethrovesical angle for trigger point.  I did not identify a trigger  point when I examined her in the clinic last time.  I will ask her to  see Wilda.   Importantly she was hydrodistended to 900 mL.  I reexamined her and see  no glomerulations.  She did not  have cystoscopic findings of  interstitial cystitis.           ______________________________  Martina Sinner, MD  Electronically Signed     SAM/MEDQ  D:  01/30/2007  T:  01/30/2007  Job:  [id number]

## 2011-01-13 ENCOUNTER — Other Ambulatory Visit: Payer: Self-pay | Admitting: Family Medicine

## 2011-01-13 ENCOUNTER — Ambulatory Visit (INDEPENDENT_AMBULATORY_CARE_PROVIDER_SITE_OTHER): Payer: BC Managed Care – PPO | Admitting: Family Medicine

## 2011-01-13 ENCOUNTER — Encounter: Payer: Self-pay | Admitting: Family Medicine

## 2011-01-13 DIAGNOSIS — E782 Mixed hyperlipidemia: Secondary | ICD-10-CM

## 2011-01-13 DIAGNOSIS — E785 Hyperlipidemia, unspecified: Secondary | ICD-10-CM

## 2011-01-13 DIAGNOSIS — M810 Age-related osteoporosis without current pathological fracture: Secondary | ICD-10-CM

## 2011-01-13 MED ORDER — HYDROCODONE-ACETAMINOPHEN 5-500 MG PO TABS: 1.0000 | ORAL_TABLET | Freq: Two times a day (BID) | ORAL | Status: DC | PRN

## 2011-01-13 NOTE — Progress Notes (Signed)
  Subjective:    Patient ID: [name], female    DOB: 1946/02/03, 65 y.o.   MRN: [id number]  HPI  Stopped her fosamax in March. Went to Dr. Moshe Cipro her ENT and spoke with him about it.  She did buy a better calcium supplement. She gets a separate Vitamin D pill. She needs to her vitamin D checked  She is due for cholesterol check.  She stopped taking that because she felt it was affecting her memory. We had tried several others that she felt she tolerated. She is not getting any regular exercise but plans too.  She is completing macrodantin for her uTI. She says it is often difficulty for her to clear a UTI. So would like [place] to re-culture her urine in 1-2 weeks.  Review of Systems     Objective:   Physical Exam  Constitutional: She is oriented to person, place, and time. She appears well-developed and well-nourished.  HENT:  Head: Normocephalic and atraumatic.  Cardiovascular: Normal rate, regular rhythm and normal heart sounds.        Radial Pulse 2+ bilat.   Pulmonary/Chest: Effort normal and breath sounds normal.  Musculoskeletal: She exhibits no edema.  Neurological: She is alert and oriented to person, place, and time.  Skin: Skin is warm and dry.  Psychiatric: She has a normal mood and affect.          Assessment & Plan:

## 2011-01-13 NOTE — Assessment & Plan Note (Signed)
She has tried several stating including simvastatin that she feels affects her memory or has S.E. She doesn't want to try another stating but is open to another med like Welchol or Tricor.

## 2011-01-13 NOTE — Patient Instructions (Signed)
We will call you with the lab results. 

## 2011-01-13 NOTE — Assessment & Plan Note (Signed)
She is at least taking her calcium and vit D. Will check her vit D levels.  She has her repeat in DEX[facility] in July so will take a look at this.

## 2011-01-14 ENCOUNTER — Ambulatory Visit: Payer: BC Managed Care – PPO | Admitting: Physical Therapy

## 2011-01-14 NOTE — H&P (Signed)
[name], [name]                 ACCOUNT NO.:  [id number]   MEDICAL RECORD NO.:  [id number]          PATIENT TYPE:  INP   LOCATION:  NA                           FACILITY:  [facility]   PHYSICIAN:  [name], M.D. DATE OF BIRTH:  December 22, 1945   DATE OF ADMISSION:  09/04/2006  DATE OF DISCHARGE:                              HISTORY & PHYSICAL   CHIEF COMPLAINT:  Recurrent breast cancer.   HISTORY OF PRESENT ILLNESS:  This 65 year old patient was found to have  left breast cancer and underwent a biopsy.  She was treated with a  lumpectomy, sentinel node evaluation and five years of tamoxifen.  In  followup she was found to have a right breast cancer, as well as a left  mammary node.  This was found in August of 2004.  She was treated with  Xeloda and Taxol with a slight decrease in size, but recent CT scan  shows a 1.2 x 0.9 left internal mammary node at the anterior T4 level.  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   PAST SURGICAL HISTORY:  Previous surgeries include a hysterectomy,  oophorectomy, bladder suspension.   PAST MEDICAL HISTORY:  Urinary tract infection and she has had a urinary  tract infection after the above surgeries.  She also has had  cholesterolemia.  She has had mild weight loss.  No hemoptysis, fevers  or chills.  She has had had no other symptomatology.  Pulmonary function  tests show an FVC of 3.08 and an FEV1 of 2.73.  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   ALLERGIES:  SHE IS ALLERGIC TO PENICILLIN AND DURICEF.   MEDICATIONS:  Include:  1. Crestor 20 mg daily.  2. Zyrtec 10 mg a day.  3. Singulair 20 mg a day.  4. Astelin nose spray.  5. Macrobid 100 mg daily.  6. Tramodol p.r.n.  7. Boniva once a month.  8. Ativan 1 mg p.r.n.  9. Calcium 1 g twice daily.   FAMILY HISTORY:  Mother had coronary disease and there is a history of  cancer.   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  She does  not drink alcohol on a regular basis.  She does not smoke.   REVIEW OF SYSTEMS:  She is 150 pounds.  She is 5 feet 7 inches.  Cardiac:  She has a heart murmur.  Pulmonary:  No bronchitis,  hemoptysis, asthma or wheezing.  GI:  No reflux, nausea, vomiting,  constipation.  GU:  Is on Macrobid for chronic cystitis.  Vascular:  No  claudication, DVT, TIA.  Neurologic:  No headaches, black outs or  seizures.  Musculoskeletal:  She has degenerative disk disease in her  low back.  Psychiatric:  Negative.  ENT:  No change in her eyesight or  hearing.  Hematological:  No problems with bleeding or anemia.   PHYSICAL EXAMINATION:  GENERAL:  She is a well-developed, Caucasian  female in no acute distress.  VITAL SIGNS:  Her blood pressure is 110/72, pulse 100, respirations 18  and saturations were 95%.  HEENT:  Head is atraumatic.  Eyes:  Pupils equal and reactive to light  and accommodation.  Ears:  Tympanic membranes are intact.  Nose:  There  is no septal deviation.  Throat is without lesions.  NECK:  Supple without thyromegaly.  There is no supraclavicular or  axillary adenopathy.  There are no carotid bruits.  CHEST:  Clear to auscultation and percussion.  BREASTS:  Reveal the left lumpectomy scar.  She has had radiation to the  right breast.  HEART:  Regular sinus rhythm.  There is a 2/6 systolic ejection murmur.  ABDOMEN:  Soft.  There is no hepatosplenomegaly.  EXTREMITIES:  Pulses are 2+.  There is clubbing or edema.  NEUROLOGIC:  She is oriented x3.  Sensory and motor are intact.  Deep  tendon reflexes are intact.  INR 2+.   IMPRESSION:  Recurrent breast cancer with left mammary node recurrence.   PLAN:  Resection of left internal mammary node.           ______________________________  [name], M.D.     DPB/MEDQ  D:  09/02/2006  T:  09/03/2006  Job:  [id number]

## 2011-01-14 NOTE — Discharge Summary (Signed)
[name], LARTIGUE NO.:  [id number]   MEDICAL RECORD NO.:  [id number]          PATIENT TYPE:  INP   LOCATION:  2027                         FACILITY:  [facility]   PHYSICIAN:  Ines Bloomer, M.D. 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   IN-HOSPITAL DIAGNOSIS:  Acute on chronic urinary tract infection.   SECONDARY DIAGNOSES:  1. Chronic urinary tract infection status post bladder suspension.  2. Hyperlipidemia.  3. Status post hysterectomy and oophorectomy.  4. History __________ trigeminal area of left facial nerve.   ALLERGIES:  SEASONAL ALLERGIES.   IN-HOUSE OPERATIONS AND PROCEDURES:  Video-assisted thoracoscopic  surgery with mini thoracotomy and resection of left internal mammary  duct.   PATIENT'S HISTORY AND PHYSICAL AND HOSPITAL COURSE:  The patient is a 65-  year-old female who was found to have left breast cancer and underwent  biopsy.  She was treated with lumpectomy, sentinel node evaluation and  five years of tamoxifen.  In followup, she was found to have a right  breast cancer.  There is also no left mammary.  This was done August  2004.  She was treated with Xeloda and Taxol with a slight decrease in  size.  CT scan showed 1.2 x 0.9 left internal mammary node still at the  anterior T4 level.  The most recent MRI showed a small meningioma in the  right cerebral region.  The patient was seen and evaluated by Dr.  Edwyna Shell.  Dr. Edwyna Shell discussed with the patient ongoing resection of left  internal mammary node for further diagnosis.  He discussed risks and  benefits with the patient.  The patient acknowledged her understanding  and agreed to proceed.  Surgery was scheduled for September 04, 2006.  For  details of the patient's past medical history and physical exam, please  see  dictated history and physical.   The patient was taken to the operating room September 04, 2006 where she  underwent left video-assisted thoracoscopic surgery with mini  thoracotomy, resection of left internal mammary node.  The patient  tolerated the procedure well and was transferred to the intensive care  unit in stable condition.  The patient's pathology report came back  positive for metastatic adenocarcinoma.  Dr. Darrold Span was consulted and  evaluated the patient on postoperative day #1, January 2008.  Dr.  Darrold Span was familiar with the patient's history.  He plan to follow up  with the patient in his office September 14, 2006 at 9:30 a.m.  He will  also plan to resume chemotherapy as soon as stable from surgical  standpoint.  The patient's postoperative course was pretty much  unremarkable.  Postoperative chest x-ray was stable.  Posterior chest  tube discontinued postoperative day #1.  She was able to be weaned off  oxygen sating greater than 90% on room air postoperative day one.  No  air leak noted.   By postoperative day #2, the  patient's chest x-ray remained stable, no  pneumothorax was noted.  Remaining chest tube was discontinued.  Followup chest x-ray in the a.m. remained stable.  She again remained on  room air with sats greater than 90%.  Her incisions were clean, dry and  intact and healing well.  Postoperatively, the patient was noted to have  developed an acute on chronic urinary tract infection growing E. coli.  She was continued on Levaquin for several days and this was discontinued  prior to discharge home.  She has continued on her Macrobid dose at  night.  Postoperatively, the patient was out of bed ambulating well.  Tolerating diet well.  __________ to be stable.  She is noted to be  afebrile postoperatively.  The patient remained hemodynamically stable.   Labs obtained postoperative day #3 showed a white count 3.9, hemoglobin  10.7, hematocrit 31.4, platelet count  179.   Patient is tentatively ready to be discharged home in the morning  postoperative day #4, September 08, 2006.   Followup appointment will be arranged with Dr. Edwyna Shell for in one week.  Our office will contact the patient with this information.  The patient  also needs to obtain a chest x-ray one hour prior to this appointment.  The patient is to follow up with Dr. Darrold Span on September 14, 2006 and  9:30 a.m. labs to be done and 10 o'clock appointment.   ACTIVITY:  Ms. [name] was instructed no driving until released to do so,  no heavy lifting over 10 pounds.  She is told to ambulate 3-4 times per  day, progress as tolerated and continue her breathing exercises.   INCISIONAL CARE:  The patient is told to shower washing her incisions  using soap and water.  She is to contact the office if she develops any  drainage or opening from any of her incision sites.   DIET:  The patient was educated on diet to be low-fat, low-salt.   DISCHARGE MEDICATIONS:  1. Percocet 5/325 1-2 tablets q.4-6h. p.r.n. pain.  2. Crestor 20 mg daily.  3. Macrobid 100 mg at night.  4. Singulair 10 mg at night.  5. Protonix 20 mg daily.  6. Alphagan 0.15% one drop to eye t.i.d.  7. Peridium 100 mg t.i.d.  8. Senokot S b.i.d.  9. Albuterol as used at home.  10.Zyrtec 10 mg at night.  11.Nasacort nasal spray as used at home.  12.Boniva monthly.  13.Ativan as used at home.  14.Calcium and vitamin D daily.  15.Tramadol as used at home.      Theda Belfast, PA    ______________________________  Ines Bloomer, M.D.   KMD/MEDQ  D:  09/07/2006  T:  09/07/2006  Job:  [id number]   cc:   Ines Bloomer, M.D.  Lennis P. Darrold Span, M.D.

## 2011-01-18 ENCOUNTER — Other Ambulatory Visit: Payer: Self-pay | Admitting: Family Medicine

## 2011-01-19 LAB — COMPLETE METABOLIC PANEL WITH GFR
ALT: 18 U/L (ref 0–35)
Albumin: 4.4 g/dL (ref 3.5–5.2)
CO2: 29 mEq/L (ref 19–32)
Calcium: 9.4 mg/dL (ref 8.4–10.5)
Chloride: 95 mEq/L — ABNORMAL LOW (ref 96–112)
Creat: 0.68 mg/dL (ref 0.40–1.20)
GFR, Est African American: >60 mL/min (ref >60)
Potassium: 4.1 mEq/L (ref 3.5–5.3)

## 2011-01-19 LAB — VITAMIN D 25 HYDROXY (VIT D DEFICIENCY, FRACTURES): Vit D, 25-Hydroxy: 49 ng/mL (ref 30–89)

## 2011-01-19 LAB — LIPID PANEL
LDL Cholesterol: 194 mg/dL — ABNORMAL HIGH (ref 0–99)
Triglycerides: 240 mg/dL — ABNORMAL HIGH (ref <150)

## 2011-01-21 ENCOUNTER — Telehealth: Payer: Self-pay | Admitting: Family Medicine

## 2011-01-21 MED ORDER — LEVOTHYROXINE SODIUM 25 MCG PO TABS: 25.0000 ug | ORAL_TABLET | Freq: Every day | ORAL | Status: DC

## 2011-01-21 MED ORDER — SULFAMETHOXAZOLE-TMP DS 800-160 MG PO TABS: 1.0000 | ORAL_TABLET | Freq: Two times a day (BID) | ORAL | Status: AC

## 2011-01-21 MED ORDER — FENOFIBRATE 145 MG PO TABS: 145.0000 mg | ORAL_TABLET | Freq: Every day | ORAL | Status: DC

## 2011-01-21 NOTE — Telephone Encounter (Signed)
Pt notified..the patient states she is was tx for a uti and recently finished her meds but she feels infection is coming back, she is having frequent urination and burning with urination. Pt wants abx called in. Asked if she could come by for a u/a and she states she was unable to come in because she is with her sister in ICU

## 2011-01-21 NOTE — Telephone Encounter (Signed)
Will call in bactrim but if sxs recur then needs appt for cath urine.

## 2011-01-21 NOTE — Telephone Encounter (Signed)
Called and left message with pt;s husband

## 2011-01-21 NOTE — Telephone Encounter (Signed)
Call patient: Sodium and chloride are both low. Let's increase the natural salt in your diet and also touch her fluid pill in half. This could be contributing. The let's recheck your sodium and chloride in 1 week. Also care cholesterol is much higher than it was last time. Let's start TriCor as we had discussed the office visit. It is not a statin. Recheck lipids in 3 months.your vitamin D continues to  improve it looks good. TSH is slightly elevated. I would like to start Synthroid 25 mcg. Then we'll recheck her thyroid level in 3 months. This may actually help with some of her aches and fatigue.

## 2011-01-27 ENCOUNTER — Telehealth: Payer: Self-pay | Admitting: *Deleted

## 2011-01-27 DIAGNOSIS — B373 Candidiasis of vulva and vagina: Secondary | ICD-10-CM

## 2011-01-27 MED ORDER — FLUCONAZOLE 150 MG PO TABS: 150.0000 mg | ORAL_TABLET | Freq: Once | ORAL | Status: AC

## 2011-01-27 NOTE — Telephone Encounter (Signed)
Pt called and states she is getting a yeast infection in her mouth and also is having vaginal discharge since she has been on bactrim. Pt states she has nystatin mouth wash that was rx'ed to use prn. Advised pt to use this and if it does not clear up call back.pt wanted [place] to call in diflucan.will send to pharm

## 2011-01-28 ENCOUNTER — Telehealth: Payer: Self-pay | Admitting: *Deleted

## 2011-01-28 ENCOUNTER — Encounter: Payer: Self-pay | Admitting: Family Medicine

## 2011-01-28 DIAGNOSIS — R899 Unspecified abnormal finding in specimens from other organs, systems and tissues: Secondary | ICD-10-CM

## 2011-01-28 DIAGNOSIS — C50919 Malignant neoplasm of unspecified site of unspecified female breast: Secondary | ICD-10-CM | POA: Insufficient documentation

## 2011-01-28 NOTE — Telephone Encounter (Signed)
Order put in for labs

## 2011-01-29 LAB — BASIC METABOLIC PANEL WITH GFR
BUN: 13 mg/dL (ref 6–23)
CO2: 33 mEq/L — ABNORMAL HIGH (ref 19–32)
Chloride: 100 mEq/L (ref 96–112)
Creat: 0.72 mg/dL (ref 0.50–1.10)

## 2011-01-31 ENCOUNTER — Other Ambulatory Visit: Payer: Self-pay | Admitting: Family Medicine

## 2011-02-07 ENCOUNTER — Other Ambulatory Visit: Payer: Self-pay | Admitting: Family Medicine

## 2011-02-07 NOTE — Telephone Encounter (Signed)
Received request from pharm for refill of Omeprazole  20mg . Plan:  Reviewed the pt chart and this refill was given on 01-31-11 for #30/3 refills. Jarvis Newcomer, LPN [facility]

## 2011-02-08 ENCOUNTER — Encounter: Payer: Self-pay | Admitting: Family Medicine

## 2011-02-08 DIAGNOSIS — G500 Trigeminal neuralgia: Secondary | ICD-10-CM | POA: Insufficient documentation

## 2011-02-09 ENCOUNTER — Telehealth: Payer: Self-pay | Admitting: Family Medicine

## 2011-02-09 NOTE — Telephone Encounter (Signed)
Call pt: Sodium adn chloride are nl. She can take her fluid pill every other day.  When due to recheck her other labs in about 3 months we will recheck sodium and chloride and make sure stable.

## 2011-02-09 NOTE — Telephone Encounter (Signed)
Message copied by [employer] on Wed Feb 09, 2011  8:36 PM ------      Message from: Payton Spark P      Created: Wed Feb 09, 2011  4:30 PM                   ----- Message -----         From: Lab In [place] Interface         Sent: 01/28/2011  11:08 PM           To: Avon Gully, CMA

## 2011-02-10 ENCOUNTER — Encounter: Payer: Self-pay | Admitting: Family Medicine

## 2011-02-10 ENCOUNTER — Ambulatory Visit (INDEPENDENT_AMBULATORY_CARE_PROVIDER_SITE_OTHER): Payer: BC Managed Care – PPO | Admitting: Family Medicine

## 2011-02-10 DIAGNOSIS — J029 Acute pharyngitis, unspecified: Secondary | ICD-10-CM

## 2011-02-10 DIAGNOSIS — R350 Frequency of micturition: Secondary | ICD-10-CM

## 2011-02-10 DIAGNOSIS — R238 Other skin changes: Secondary | ICD-10-CM

## 2011-02-10 DIAGNOSIS — L988 Other specified disorders of the skin and subcutaneous tissue: Secondary | ICD-10-CM

## 2011-02-10 NOTE — Telephone Encounter (Signed)
Pt advised of results and rec. 

## 2011-02-10 NOTE — Patient Instructions (Signed)
Clean the area with hydrogen peroxide once a day until we call with the results.

## 2011-02-10 NOTE — Progress Notes (Signed)
  Subjective:    Patient ID: [name], female    DOB: 27-Apr-1946, 65 y.o.   MRN: [id number]  HPI ST for about 3-4 days. MIld initially. Then felt something bumpy on the roof of her mouth yesterday.  Mild soreness on and off.  + HA. Nose and ears are itching.  No cough. HA is bilateral near her ears. Off her antibiotics from the last UTI.  No worsening or alleviating sxs.   She is still having urinary frequency and some incontinenence even after tx for UTI and she takes macrodantin daily for prophylaxis.  No fever or constitutional sxs.  Review of Systems     Objective:   Physical Exam  Constitutional: She is oriented to person, place, and time. She appears well-developed and well-nourished.  HENT:  Head: Normocephalic and atraumatic.  Right Ear: External ear normal.  Left Ear: External ear normal.  Nose: Nose normal.       TMs and canals are clear. Hard palate has an erythematous area with fine white tiny ulcerations.  Area was swabbed for a KOH.   Eyes: Conjunctivae and EOM are normal. Pupils are equal, round, and reactive to light.  Neck: Neck supple. No thyromegaly present.  Cardiovascular: Normal rate, regular rhythm and normal heart sounds.   Pulmonary/Chest: Effort normal and breath sounds normal. She has no wheezes.  Lymphadenopathy:    She has no cervical adenopathy.  Neurological: She is alert and oriented to person, place, and time.  Skin: Skin is warm and dry.  Psychiatric: She has a normal mood and affect.          Assessment & Plan:  Pharyngitis - Neg for strep. On the area on the roof of her mouth recommend clean daily with peroxide. I did swab for a KOH to rule out fungal cause.    Urinary freq - Will send a culture to see if has a true UTI.

## 2011-02-13 ENCOUNTER — Other Ambulatory Visit: Payer: Self-pay | Admitting: Family Medicine

## 2011-02-14 ENCOUNTER — Telehealth: Payer: Self-pay | Admitting: Family Medicine

## 2011-02-14 LAB — URINE CULTURE: Colony Count: >=100000

## 2011-02-14 MED ORDER — SULFAMETHOXAZOLE-TMP DS 800-160 MG PO TABS: 1.0000 | ORAL_TABLET | Freq: Two times a day (BID) | ORAL | Status: AC

## 2011-02-14 NOTE — Telephone Encounter (Addendum)
Call patient: Wet prep was normal. No sign of vaginitis. Her urine culture did come back positive. I will send over an antibiotic.

## 2011-02-15 NOTE — Telephone Encounter (Signed)
Advised pt of results and med at pharmacy.

## 2011-02-16 ENCOUNTER — Other Ambulatory Visit: Payer: Self-pay | Admitting: Family Medicine

## 2011-02-20 ENCOUNTER — Other Ambulatory Visit: Payer: Self-pay | Admitting: Family Medicine

## 2011-02-21 ENCOUNTER — Other Ambulatory Visit: Payer: Self-pay | Admitting: Family Medicine

## 2011-02-21 MED ORDER — TRAMADOL HCL 50 MG PO TABS: 50.0000 mg | ORAL_TABLET | Freq: Three times a day (TID) | ORAL | Status: DC | PRN

## 2011-03-04 ENCOUNTER — Ambulatory Visit
Admission: RE | Admit: 2011-03-04 | Discharge: 2011-03-04 | Disposition: A | Discharge status: Home / Self Care | Payer: BC Managed Care – PPO | Source: Ambulatory Visit | Attending: Oncology | Admitting: Oncology

## 2011-03-04 DIAGNOSIS — Z79899 Other long term (current) drug therapy: Secondary | ICD-10-CM

## 2011-03-07 ENCOUNTER — Telehealth: Payer: Self-pay | Admitting: Family Medicine

## 2011-03-07 NOTE — Telephone Encounter (Signed)
Advised pt of test result and rec. Pt  States she will make an appt in August to discuss further.

## 2011-03-07 NOTE — Telephone Encounter (Signed)
Call patient: Bone density test shows that her T score is -2.9. This is still not osteoporosis range and I still recommend a bisphosphonate. She has declined these in the past but see if she would be willing to reconsider. Her bone density test is stable.

## 2011-03-16 ENCOUNTER — Other Ambulatory Visit: Payer: Self-pay | Admitting: Family Medicine

## 2011-03-17 ENCOUNTER — Other Ambulatory Visit: Payer: Self-pay | Admitting: Family Medicine

## 2011-03-18 ENCOUNTER — Telehealth: Payer: Self-pay | Admitting: Family Medicine

## 2011-03-18 NOTE — Telephone Encounter (Signed)
Call pt: Since she is on chronic pain meds she needs to come by adn sign a narcotic contract.

## 2011-03-19 ENCOUNTER — Other Ambulatory Visit: Payer: Self-pay | Admitting: Family Medicine

## 2011-03-19 NOTE — Telephone Encounter (Signed)
Refill request for lorazepam, and denied because just refilled on 03-17-11 and good til 05-18-11.

## 2011-03-19 NOTE — Telephone Encounter (Signed)
RF req for hydrocodone 5/500 mg.  Last rf given on 03-17-11 and good til 04-17-11.

## 2011-03-20 ENCOUNTER — Telehealth: Payer: Self-pay | Admitting: Family Medicine

## 2011-03-20 NOTE — Telephone Encounter (Signed)
Error

## 2011-03-21 ENCOUNTER — Other Ambulatory Visit: Payer: Self-pay | Admitting: *Deleted

## 2011-03-21 ENCOUNTER — Encounter: Payer: Self-pay | Admitting: Family Medicine

## 2011-03-21 NOTE — Telephone Encounter (Signed)
Pt.notified

## 2011-03-28 ENCOUNTER — Other Ambulatory Visit: Payer: Self-pay | Admitting: Family Medicine

## 2011-04-04 ENCOUNTER — Other Ambulatory Visit: Payer: Self-pay | Admitting: Family Medicine

## 2011-04-15 ENCOUNTER — Ambulatory Visit (INDEPENDENT_AMBULATORY_CARE_PROVIDER_SITE_OTHER): Payer: BC Managed Care – PPO | Admitting: Family Medicine

## 2011-04-15 VITALS — BP 141/86 | HR 106 | Temp 98.0°F

## 2011-04-15 DIAGNOSIS — R300 Dysuria: Secondary | ICD-10-CM

## 2011-04-15 LAB — POCT URINALYSIS DIPSTICK
Bilirubin, UA: NEGATIVE
Ketones, UA: NEGATIVE
Nitrite, UA: POSITIVE
Spec Grav, UA: 1.01
pH, UA: 6.5

## 2011-04-15 MED ORDER — CEPHALEXIN 500 MG PO CAPS: 500.0000 mg | ORAL_CAPSULE | Freq: Three times a day (TID) | ORAL | Status: DC

## 2011-04-15 NOTE — Progress Notes (Signed)
  Subjective:    Patient ID: [name], female    DOB: [birth year], 65 y.o.   MRN: [id number]  HPI Pt c/o urinary frequency and urgency x 1 day. No fever.     Review of Systems     Objective:   Physical Exam        Assessment & Plan:  UTI - UA is +. Will tx. Call if sxs not resolved in 3 days.  Call if fever or back pain.

## 2011-04-15 NOTE — Patient Instructions (Signed)
Call if not better in 3-4 days

## 2011-04-18 ENCOUNTER — Ambulatory Visit (INDEPENDENT_AMBULATORY_CARE_PROVIDER_SITE_OTHER): Payer: BC Managed Care – PPO | Admitting: Family Medicine

## 2011-04-18 ENCOUNTER — Telehealth: Payer: Self-pay | Admitting: *Deleted

## 2011-04-18 VITALS — BP 133/81 | HR 74 | Temp 98.6°F

## 2011-04-18 DIAGNOSIS — N390 Urinary tract infection, site not specified: Secondary | ICD-10-CM

## 2011-04-18 LAB — POCT URINALYSIS DIPSTICK
Bilirubin, UA: NEGATIVE
Glucose, UA: NEGATIVE
Ketones, UA: NEGATIVE
Spec Grav, UA: 1.01

## 2011-04-18 MED ORDER — SULFAMETHOXAZOLE-TMP DS 800-160 MG PO TABS: 1.0000 | ORAL_TABLET | Freq: Two times a day (BID) | ORAL | Status: AC

## 2011-04-18 NOTE — Telephone Encounter (Signed)
Pt notified of ua results

## 2011-04-18 NOTE — Progress Notes (Signed)
  Subjective:    Patient ID: [name], female    DOB: October 07, 1945, 65 y.o.   MRN: [id number]  HPI  Urinary frequency on sat and sun night,pressure in bottom and groin and upper thigh  Review of Systems     Objective:   Physical Exam        Assessment & Plan:  UTi - Will send for culture and tx acutely. She already completed the keflex.

## 2011-04-19 ENCOUNTER — Telehealth: Payer: Self-pay | Admitting: *Deleted

## 2011-04-19 DIAGNOSIS — N390 Urinary tract infection, site not specified: Secondary | ICD-10-CM

## 2011-04-19 NOTE — Telephone Encounter (Signed)
Urine culture form nurse visit for yesterday

## 2011-04-21 ENCOUNTER — Encounter: Payer: Self-pay | Admitting: Family Medicine

## 2011-04-21 ENCOUNTER — Ambulatory Visit (INDEPENDENT_AMBULATORY_CARE_PROVIDER_SITE_OTHER): Payer: BC Managed Care – PPO | Admitting: Family Medicine

## 2011-04-21 ENCOUNTER — Telehealth: Payer: Self-pay | Admitting: Family Medicine

## 2011-04-21 DIAGNOSIS — E039 Hypothyroidism, unspecified: Secondary | ICD-10-CM

## 2011-04-21 DIAGNOSIS — E785 Hyperlipidemia, unspecified: Secondary | ICD-10-CM

## 2011-04-21 DIAGNOSIS — N390 Urinary tract infection, site not specified: Secondary | ICD-10-CM

## 2011-04-21 DIAGNOSIS — Z23 Encounter for immunization: Secondary | ICD-10-CM

## 2011-04-21 DIAGNOSIS — M542 Cervicalgia: Secondary | ICD-10-CM

## 2011-04-21 LAB — URINE CULTURE

## 2011-04-21 NOTE — Progress Notes (Signed)
  Subjective:    Patient ID: [name], female    DOB: 03/28/1946, 65 y.o.   MRN: [id number]  HPI Neck pain - About 1.5 weeks ago started having pain after reached up and pulled up the garage door.  Has been doing her exercises, heat and cold.Was using some anti-inflammatories but started to cause GI upset.  Yesterday rested all day and it felt better. Then did her exercises last night and now hurts again. Pain on right and left side of neck today.  Has done PT in the past and that really helped.   Also due for chol check.   UTI-she just stopped the Bactrim for her UTI. She does feel better. She would like to go on something prophylactically until her appointment with urology which is in about 2 weeks. She used to be on Bactrim prophylactically. We are still awaiting her urine culture results.  Review of Systems     Objective:   Physical Exam  Musculoskeletal:       NEck with normal flexion, extension. Slight dec rot RT/LT but symmetric.  Normal side bending. Nontender cervical spine and paraspinous muscles. She does have a lot of tension over the traps.           Assessment & Plan:  Neck pain- musculoskeletal, most consistent with trapezius strain. will refer to PT since has done well with this in the past. She should avoid NSAIDs and she's had recent stomach irritation from these. She can certainly use Tylenol. She aren't have narcotic pain medication. She continue attending heat and ice it this is helpful.  Wants to know if cna take melatonin for sleep, as it does seem to help her.Burgess Estelle for her to take this.  UTI-she is improved. Her culture results show that she is sensitive to Bactrim then I will put her on a prophylactic dose until her followup with urology in about 2 weeks.  Hyperlipidemia-given lab slip to recheck levels.

## 2011-04-21 NOTE — Telephone Encounter (Signed)
Call pt: Urine culture was negative.  Will hold off on prophylaxis until see Urology.

## 2011-04-21 NOTE — Patient Instructions (Signed)
We will call you with the lab and culture results.

## 2011-04-22 LAB — TSH: TSH: 4.121 u[IU]/mL (ref 0.350–4.500)

## 2011-04-22 LAB — LIPID PANEL
Cholesterol: 304 mg/dL — ABNORMAL HIGH (ref 0–200)
HDL: 69 mg/dL (ref >39)
Triglycerides: 157 mg/dL — ABNORMAL HIGH (ref <150)

## 2011-04-22 NOTE — Telephone Encounter (Signed)
Pt notified of results and instructions. KJ LPN 

## 2011-04-23 LAB — COMPLETE METABOLIC PANEL WITH GFR
CO2: 30 mEq/L (ref 19–32)
Calcium: 10 mg/dL (ref 8.4–10.5)
Chloride: 95 mEq/L — ABNORMAL LOW (ref 96–112)
Creat: 0.93 mg/dL (ref 0.50–1.10)
GFR, Est African American: >60 mL/min (ref >60)
GFR, Est Non African American: >60 mL/min (ref >60)
Glucose, Bld: 82 mg/dL (ref 70–99)
Total Bilirubin: 0.4 mg/dL (ref 0.3–1.2)
Total Protein: 6.7 g/dL (ref 6.0–8.3)

## 2011-04-25 ENCOUNTER — Telehealth: Payer: Self-pay | Admitting: Family Medicine

## 2011-04-25 DIAGNOSIS — E878 Other disorders of electrolyte and fluid balance, not elsewhere classified: Secondary | ICD-10-CM

## 2011-04-25 MED ORDER — LEVOTHYROXINE SODIUM 50 MCG PO TABS: 50.0000 ug | ORAL_TABLET | Freq: Every day | ORAL | Status: DC

## 2011-04-25 NOTE — Telephone Encounter (Signed)
Call pt: Thyroid looks better but I still want to inc her dose. Is she taking it every day about an 1 hr before breakfast for maximal absorption. Recheck level in 8 weeks after start the new pill. Chol is stil very high. I know she doesn't want to take a statin. Chloride is borderine low so lets recheck in 2 weeks.

## 2011-04-25 NOTE — Telephone Encounter (Signed)
Left message on vm with results  

## 2011-04-26 ENCOUNTER — Ambulatory Visit: Payer: BC Managed Care – PPO | Attending: Family Medicine | Admitting: Physical Therapy

## 2011-04-26 DIAGNOSIS — IMO0001 Reserved for inherently not codable concepts without codable children: Secondary | ICD-10-CM | POA: Insufficient documentation

## 2011-04-26 DIAGNOSIS — M6281 Muscle weakness (generalized): Secondary | ICD-10-CM | POA: Insufficient documentation

## 2011-04-26 DIAGNOSIS — M542 Cervicalgia: Secondary | ICD-10-CM | POA: Insufficient documentation

## 2011-04-27 ENCOUNTER — Other Ambulatory Visit: Payer: Self-pay | Admitting: Family Medicine

## 2011-04-28 ENCOUNTER — Ambulatory Visit: Payer: BC Managed Care – PPO | Admitting: Physical Therapy

## 2011-04-29 ENCOUNTER — Other Ambulatory Visit: Payer: Self-pay | Admitting: *Deleted

## 2011-05-03 ENCOUNTER — Other Ambulatory Visit: Payer: Self-pay | Admitting: Oncology

## 2011-05-03 ENCOUNTER — Encounter (HOSPITAL_BASED_OUTPATIENT_CLINIC_OR_DEPARTMENT_OTHER): Payer: BC Managed Care – PPO | Admitting: Oncology

## 2011-05-03 ENCOUNTER — Other Ambulatory Visit: Payer: Self-pay | Admitting: Family Medicine

## 2011-05-03 DIAGNOSIS — C50919 Malignant neoplasm of unspecified site of unspecified female breast: Secondary | ICD-10-CM

## 2011-05-03 DIAGNOSIS — Z170 Estrogen receptor positive status [ER+]: Secondary | ICD-10-CM

## 2011-05-03 DIAGNOSIS — M810 Age-related osteoporosis without current pathological fracture: Secondary | ICD-10-CM

## 2011-05-03 DIAGNOSIS — Z1231 Encounter for screening mammogram for malignant neoplasm of breast: Secondary | ICD-10-CM

## 2011-05-03 LAB — COMPREHENSIVE METABOLIC PANEL
ALT: 11 U/L (ref 0–35)
AST: 18 U/L (ref 0–37)
Albumin: 4.7 g/dL (ref 3.5–5.2)
Alkaline Phosphatase: 35 U/L — ABNORMAL LOW (ref 39–117)
BUN: 13 mg/dL (ref 6–23)
Calcium: 9.7 mg/dL (ref 8.4–10.5)
Chloride: 100 mEq/L (ref 96–112)
Potassium: 4.2 mEq/L (ref 3.5–5.3)

## 2011-05-03 LAB — CBC WITH DIFFERENTIAL/PLATELET
BASO%: 0.4 % (ref 0.0–2.0)
Basophils Absolute: 0 10*3/uL (ref 0.0–0.1)
EOS%: 1.3 % (ref 0.0–7.0)
MCH: 32.3 pg (ref 25.1–34.0)
MCHC: 34.5 g/dL (ref 31.5–36.0)
MCV: 93.4 fL (ref 79.5–101.0)
MONO%: 7.4 % (ref 0.0–14.0)
RBC: 4.11 10*6/uL (ref 3.70–5.45)
RDW: 12.4 % (ref 11.2–14.5)
lymph#: 1.5 10*3/uL (ref 0.9–3.3)

## 2011-05-04 ENCOUNTER — Ambulatory Visit: Payer: BC Managed Care – PPO | Attending: Family Medicine | Admitting: Physical Therapy

## 2011-05-04 DIAGNOSIS — M542 Cervicalgia: Secondary | ICD-10-CM | POA: Insufficient documentation

## 2011-05-04 DIAGNOSIS — M6281 Muscle weakness (generalized): Secondary | ICD-10-CM | POA: Insufficient documentation

## 2011-05-04 DIAGNOSIS — IMO0001 Reserved for inherently not codable concepts without codable children: Secondary | ICD-10-CM | POA: Insufficient documentation

## 2011-05-05 ENCOUNTER — Telehealth: Payer: Self-pay | Admitting: *Deleted

## 2011-05-06 ENCOUNTER — Ambulatory Visit: Payer: BC Managed Care – PPO | Admitting: Physical Therapy

## 2011-05-06 NOTE — Telephone Encounter (Signed)
error 

## 2011-05-09 ENCOUNTER — Telehealth: Payer: Self-pay | Admitting: *Deleted

## 2011-05-09 ENCOUNTER — Other Ambulatory Visit: Payer: Self-pay | Admitting: Family Medicine

## 2011-05-09 DIAGNOSIS — IMO0001 Reserved for inherently not codable concepts without codable children: Secondary | ICD-10-CM

## 2011-05-09 NOTE — Telephone Encounter (Signed)
Needs lab slip printed to recheck sodium chloride

## 2011-05-10 ENCOUNTER — Ambulatory Visit: Payer: BC Managed Care – PPO | Admitting: Physical Therapy

## 2011-05-11 ENCOUNTER — Telehealth: Payer: Self-pay | Admitting: Family Medicine

## 2011-05-11 LAB — BASIC METABOLIC PANEL WITH GFR
BUN: 16 mg/dL (ref 6–23)
CO2: 31 mEq/L (ref 19–32)
Calcium: 9.7 mg/dL (ref 8.4–10.5)
Chloride: 101 mEq/L (ref 96–112)
Creat: 0.72 mg/dL (ref 0.50–1.10)
GFR, Est Non African American: >60 mL/min (ref >60)

## 2011-05-11 NOTE — Telephone Encounter (Signed)
Call patient: BMP is normal. This is great.

## 2011-05-11 NOTE — Telephone Encounter (Signed)
Pt informed of normal lab result. Jarvis Newcomer, LPN [facility]

## 2011-05-13 ENCOUNTER — Ambulatory Visit: Payer: BC Managed Care – PPO | Admitting: Physical Therapy

## 2011-05-16 ENCOUNTER — Ambulatory Visit: Payer: BC Managed Care – PPO | Admitting: Physical Therapy

## 2011-05-18 ENCOUNTER — Ambulatory Visit: Payer: BC Managed Care – PPO | Admitting: Physical Therapy

## 2011-05-20 ENCOUNTER — Other Ambulatory Visit: Payer: Self-pay | Admitting: Family Medicine

## 2011-05-22 ENCOUNTER — Other Ambulatory Visit: Payer: Self-pay | Admitting: Family Medicine

## 2011-05-23 ENCOUNTER — Ambulatory Visit: Payer: BC Managed Care – PPO | Admitting: Physical Therapy

## 2011-05-24 ENCOUNTER — Other Ambulatory Visit: Payer: Self-pay | Admitting: Family Medicine

## 2011-05-25 ENCOUNTER — Ambulatory Visit: Payer: BC Managed Care – PPO | Admitting: Physical Therapy

## 2011-05-26 ENCOUNTER — Other Ambulatory Visit: Payer: Self-pay | Admitting: Family Medicine

## 2011-05-30 ENCOUNTER — Ambulatory Visit: Payer: BC Managed Care – PPO | Attending: Family Medicine | Admitting: Physical Therapy

## 2011-05-30 DIAGNOSIS — M6281 Muscle weakness (generalized): Secondary | ICD-10-CM | POA: Insufficient documentation

## 2011-05-30 DIAGNOSIS — M542 Cervicalgia: Secondary | ICD-10-CM | POA: Insufficient documentation

## 2011-05-30 DIAGNOSIS — IMO0001 Reserved for inherently not codable concepts without codable children: Secondary | ICD-10-CM | POA: Insufficient documentation

## 2011-06-02 ENCOUNTER — Ambulatory Visit: Payer: BC Managed Care – PPO | Admitting: Physical Therapy

## 2011-06-02 ENCOUNTER — Other Ambulatory Visit: Payer: Self-pay | Admitting: Family Medicine

## 2011-06-05 ENCOUNTER — Other Ambulatory Visit: Payer: Self-pay | Admitting: Family Medicine

## 2011-06-07 ENCOUNTER — Ambulatory Visit: Payer: BC Managed Care – PPO | Admitting: Physical Therapy

## 2011-06-20 ENCOUNTER — Other Ambulatory Visit: Payer: Self-pay | Admitting: Family Medicine

## 2011-06-22 ENCOUNTER — Ambulatory Visit: Payer: Self-pay | Admitting: Family Medicine

## 2011-06-27 ENCOUNTER — Other Ambulatory Visit: Payer: Self-pay | Admitting: Family Medicine

## 2011-06-29 ENCOUNTER — Other Ambulatory Visit: Payer: Self-pay | Admitting: Family Medicine

## 2011-06-30 ENCOUNTER — Encounter: Payer: Self-pay | Admitting: Family Medicine

## 2011-07-04 ENCOUNTER — Other Ambulatory Visit: Payer: Self-pay | Admitting: Family Medicine

## 2011-07-05 ENCOUNTER — Ambulatory Visit: Payer: Self-pay | Admitting: Family Medicine

## 2011-07-07 ENCOUNTER — Telehealth: Payer: Self-pay | Admitting: Oncology

## 2011-07-07 ENCOUNTER — Other Ambulatory Visit: Payer: Self-pay | Admitting: Family Medicine

## 2011-07-07 NOTE — Telephone Encounter (Signed)
Pt lmonvm re next appt. Returned call + lmonvm for pt re next appt for 1/7 @ 11 am. Jan appt mailed today.

## 2011-07-18 ENCOUNTER — Other Ambulatory Visit: Payer: Self-pay | Admitting: Family Medicine

## 2011-07-19 ENCOUNTER — Encounter: Payer: Self-pay | Admitting: Family Medicine

## 2011-07-19 ENCOUNTER — Ambulatory Visit (INDEPENDENT_AMBULATORY_CARE_PROVIDER_SITE_OTHER): Payer: BC Managed Care – PPO | Admitting: Family Medicine

## 2011-07-19 ENCOUNTER — Telehealth: Payer: Self-pay | Admitting: Family Medicine

## 2011-07-19 VITALS — BP 117/70 | HR 113 | Wt 129.0 lb

## 2011-07-19 DIAGNOSIS — M542 Cervicalgia: Secondary | ICD-10-CM

## 2011-07-19 DIAGNOSIS — E785 Hyperlipidemia, unspecified: Secondary | ICD-10-CM

## 2011-07-19 DIAGNOSIS — R109 Unspecified abdominal pain: Secondary | ICD-10-CM

## 2011-07-19 DIAGNOSIS — R1030 Lower abdominal pain, unspecified: Secondary | ICD-10-CM

## 2011-07-19 MED ORDER — PRAVASTATIN SODIUM 40 MG PO TABS: 40.0000 mg | ORAL_TABLET | Freq: Every day | ORAL | Status: DC

## 2011-07-19 NOTE — Telephone Encounter (Signed)
Please all PT and let them know I would like to extend her PT as pt feels she is getting sig benefit from teh therapy.  If they need a new order for PT placed let me know.

## 2011-07-19 NOTE — Progress Notes (Signed)
  Subjective:    Patient ID: [name], female    DOB: 25-Nov-1945, 65 y.o.   MRN: [id number]  HPI  Neck Pain - She feels her pain is 50-60% better with PT.  Told to follow-up and re-eval her neck. [name] when got groceries it re-aggrvated her neck and started having pain and spasms.  Heating pad has been helping as well. Says the ultrasound therapy really helps her and the massage.  She would like to continue PT.    She has also recently been treated for vaginal dryness. Also have pain in the groin crease for several weeks.  Worse if standing for long periods or walking a lot. She takes pain reliever regularly. Not sure if helps or not.   Review of Systems     Objective:   Physical Exam  Constitutional: She appears well-developed and well-nourished.  HENT:  Head: Normocephalic and atraumatic.  Musculoskeletal:       Neck with normal flexion, extension, rotation right and left nd side bending.  Pain with flexion to the right.  Hips with NROM, NO pain w/ inversion and eversion, Hip strength 5/5.      Assessment & Plan:  NECk Pain - Will order additional PT.  Will f/u in 6-8 weeks. Keep home stretches and heating pad nightly.   Groin crease pain- Unclear etiology. She really has a normal hip exam so not sure this is really musculoskeletal. If pain persists at f/u then consider further evaluation.

## 2011-07-19 NOTE — Patient Instructions (Signed)
If you are tolerating the pravastatin well then go for fasting labs in 1 months to recheck your cholesterol levels.

## 2011-07-26 ENCOUNTER — Other Ambulatory Visit: Payer: Self-pay | Admitting: Family Medicine

## 2011-07-27 ENCOUNTER — Other Ambulatory Visit: Payer: Self-pay | Admitting: Family Medicine

## 2011-07-27 NOTE — Telephone Encounter (Signed)
New order placed

## 2011-07-27 NOTE — Telephone Encounter (Signed)
They need anew order placed

## 2011-07-28 ENCOUNTER — Other Ambulatory Visit: Payer: Self-pay | Admitting: Family Medicine

## 2011-08-02 ENCOUNTER — Ambulatory Visit: Payer: Self-pay | Admitting: Physical Therapy

## 2011-08-02 ENCOUNTER — Other Ambulatory Visit: Payer: Self-pay | Admitting: Family Medicine

## 2011-08-05 ENCOUNTER — Ambulatory Visit
Admission: RE | Admit: 2011-08-05 | Discharge: 2011-08-05 | Disposition: A | Discharge status: Home / Self Care | Payer: BC Managed Care – PPO | Source: Ambulatory Visit | Attending: Oncology | Admitting: Oncology

## 2011-08-05 DIAGNOSIS — Z1231 Encounter for screening mammogram for malignant neoplasm of breast: Secondary | ICD-10-CM

## 2011-08-08 ENCOUNTER — Ambulatory Visit: Payer: BC Managed Care – PPO | Attending: Family Medicine | Admitting: Physical Therapy

## 2011-08-08 DIAGNOSIS — IMO0001 Reserved for inherently not codable concepts without codable children: Secondary | ICD-10-CM | POA: Insufficient documentation

## 2011-08-08 DIAGNOSIS — M542 Cervicalgia: Secondary | ICD-10-CM | POA: Insufficient documentation

## 2011-08-08 DIAGNOSIS — M6281 Muscle weakness (generalized): Secondary | ICD-10-CM | POA: Insufficient documentation

## 2011-08-08 DIAGNOSIS — X58XXXA Exposure to other specified factors, initial encounter: Secondary | ICD-10-CM | POA: Insufficient documentation

## 2011-08-09 ENCOUNTER — Telehealth: Payer: Self-pay | Admitting: *Deleted

## 2011-08-09 NOTE — Telephone Encounter (Signed)
Pt notified. KJ LPN 

## 2011-08-09 NOTE — Telephone Encounter (Signed)
Was put on pravastatin and 4 days after taking the med had a sharp pain in buttocks. Stopped taking the med and pain stopped. So hasn't been on anything right now.Wants generic if changing med

## 2011-08-09 NOTE — Telephone Encounter (Signed)
This is not typical for muscle pain caused by a statin. i would like he to retry it in about 1-2 week and if pain recurs then let me know and we will change it.

## 2011-08-10 ENCOUNTER — Other Ambulatory Visit: Payer: Self-pay | Admitting: Family Medicine

## 2011-08-11 ENCOUNTER — Ambulatory Visit: Payer: BC Managed Care – PPO | Admitting: Physical Therapy

## 2011-08-16 ENCOUNTER — Ambulatory Visit: Payer: BC Managed Care – PPO | Admitting: Physical Therapy

## 2011-08-18 ENCOUNTER — Ambulatory Visit: Payer: BC Managed Care – PPO | Admitting: Physical Therapy

## 2011-08-23 ENCOUNTER — Other Ambulatory Visit: Payer: Self-pay | Admitting: Family Medicine

## 2011-08-25 ENCOUNTER — Ambulatory Visit: Payer: BC Managed Care – PPO | Admitting: Physical Therapy

## 2011-09-02 ENCOUNTER — Ambulatory Visit: Payer: BC Managed Care – PPO | Attending: Family Medicine | Admitting: Physical Therapy

## 2011-09-02 DIAGNOSIS — M542 Cervicalgia: Secondary | ICD-10-CM | POA: Insufficient documentation

## 2011-09-02 DIAGNOSIS — IMO0001 Reserved for inherently not codable concepts without codable children: Secondary | ICD-10-CM | POA: Insufficient documentation

## 2011-09-02 DIAGNOSIS — M6281 Muscle weakness (generalized): Secondary | ICD-10-CM | POA: Insufficient documentation

## 2011-09-05 ENCOUNTER — Other Ambulatory Visit: Payer: Self-pay | Admitting: Oncology

## 2011-09-05 ENCOUNTER — Ambulatory Visit (HOSPITAL_BASED_OUTPATIENT_CLINIC_OR_DEPARTMENT_OTHER): Payer: BC Managed Care – PPO | Admitting: Oncology

## 2011-09-05 ENCOUNTER — Telehealth: Payer: Self-pay

## 2011-09-05 ENCOUNTER — Telehealth: Payer: Self-pay | Admitting: Oncology

## 2011-09-05 ENCOUNTER — Other Ambulatory Visit: Payer: Self-pay | Admitting: Family Medicine

## 2011-09-05 ENCOUNTER — Other Ambulatory Visit (HOSPITAL_BASED_OUTPATIENT_CLINIC_OR_DEPARTMENT_OTHER): Payer: BC Managed Care – PPO | Admitting: Lab

## 2011-09-05 ENCOUNTER — Other Ambulatory Visit: Payer: Self-pay

## 2011-09-05 VITALS — BP 127/72 | HR 100 | Temp 97.4°F | Ht 66.5 in | Wt 124.9 lb

## 2011-09-05 DIAGNOSIS — C50919 Malignant neoplasm of unspecified site of unspecified female breast: Secondary | ICD-10-CM

## 2011-09-05 DIAGNOSIS — Z170 Estrogen receptor positive status [ER+]: Secondary | ICD-10-CM

## 2011-09-05 DIAGNOSIS — Z79811 Long term (current) use of aromatase inhibitors: Secondary | ICD-10-CM

## 2011-09-05 DIAGNOSIS — M810 Age-related osteoporosis without current pathological fracture: Secondary | ICD-10-CM

## 2011-09-05 DIAGNOSIS — J329 Chronic sinusitis, unspecified: Secondary | ICD-10-CM

## 2011-09-05 DIAGNOSIS — N949 Unspecified condition associated with female genital organs and menstrual cycle: Secondary | ICD-10-CM

## 2011-09-05 LAB — CBC WITH DIFFERENTIAL/PLATELET
Basophils Absolute: 0 10*3/uL (ref 0.0–0.1)
EOS%: 1.3 % (ref 0.0–7.0)
HGB: 13.2 g/dL (ref 11.6–15.9)
LYMPH%: 38 % (ref 14.0–49.7)
MCH: 32.8 pg (ref 25.1–34.0)
MCV: 93.5 fL (ref 79.5–101.0)
MONO%: 6.9 % (ref 0.0–14.0)
Platelets: 209 10*3/uL (ref 145–400)
RBC: 4.02 10*6/uL (ref 3.70–5.45)
RDW: 12.6 % (ref 11.2–14.5)

## 2011-09-05 LAB — COMPREHENSIVE METABOLIC PANEL
ALT: 13 U/L (ref 0–35)
AST: 23 U/L (ref 0–37)
Albumin: 5 g/dL (ref 3.5–5.2)
Alkaline Phosphatase: 43 U/L (ref 39–117)
Potassium: 4 mEq/L (ref 3.5–5.3)
Sodium: 139 mEq/L (ref 135–145)
Total Protein: 6.6 g/dL (ref 6.0–8.3)

## 2011-09-05 MED ORDER — LETROZOLE 2.5 MG PO TABS: 2.5000 mg | ORAL_TABLET | Freq: Every day | ORAL | Status: DC

## 2011-09-05 NOTE — Telephone Encounter (Signed)
gve the pt her may 2013 appt calendar 

## 2011-09-05 NOTE — Telephone Encounter (Signed)
SUSAN CALLED AND STATED THAT SHE NEEDS A REFERRAL PUT IN THE SYSTEM FOR REHAB REFERRAL OR HAVE ONE FAXED TO HER AT [phone].  SHE NEEDS TO KNOW WHY PT. BEING SEEN AND ATTACH IT TO THE APPT. Friday 09-09-11. CALL SUSAN TO LET HER KNOW IF REFERRAL WAS PUT IN EPIC.

## 2011-09-05 NOTE — Telephone Encounter (Signed)
Spoke with rehab PT [address] location [phone] and confirmed that [name] does perineal PT. Order for same sent to our schedulers.

## 2011-09-06 ENCOUNTER — Encounter: Payer: Self-pay | Admitting: Physical Therapy

## 2011-09-06 ENCOUNTER — Telehealth: Payer: Self-pay | Admitting: Oncology

## 2011-09-06 NOTE — Telephone Encounter (Signed)
called the # listed for phy ther and this was already set ip.  L/M for pt to make sure that she was aware of appt and to c/b if needed  aom

## 2011-09-07 ENCOUNTER — Ambulatory Visit: Payer: BC Managed Care – PPO | Admitting: Physical Therapy
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
## 2011-09-09 ENCOUNTER — Ambulatory Visit: Payer: Self-pay | Admitting: Family Medicine

## 2011-09-09 ENCOUNTER — Ambulatory Visit: Payer: BC Managed Care – PPO | Admitting: Physical Therapy

## 2011-09-12 ENCOUNTER — Other Ambulatory Visit: Payer: Self-pay | Admitting: *Deleted

## 2011-09-12 ENCOUNTER — Ambulatory Visit: Payer: BC Managed Care – PPO | Admitting: Physical Therapy

## 2011-09-12 MED ORDER — HYDROCODONE-ACETAMINOPHEN 5-500 MG PO TABS: 60.0000 | ORAL_TABLET | Freq: Two times a day (BID) | ORAL | Status: DC | PRN

## 2011-09-13 ENCOUNTER — Ambulatory Visit (INDEPENDENT_AMBULATORY_CARE_PROVIDER_SITE_OTHER): Payer: BC Managed Care – PPO | Admitting: Family Medicine

## 2011-09-13 ENCOUNTER — Encounter: Payer: Self-pay | Admitting: Family Medicine

## 2011-09-13 VITALS — BP 131/87 | HR 100 | Wt 125.0 lb

## 2011-09-13 DIAGNOSIS — E785 Hyperlipidemia, unspecified: Secondary | ICD-10-CM

## 2011-09-13 DIAGNOSIS — M542 Cervicalgia: Secondary | ICD-10-CM

## 2011-09-13 DIAGNOSIS — E039 Hypothyroidism, unspecified: Secondary | ICD-10-CM

## 2011-09-13 DIAGNOSIS — Z5181 Encounter for therapeutic drug level monitoring: Secondary | ICD-10-CM

## 2011-09-13 MED ORDER — LEVOTHYROXINE SODIUM 50 MCG PO TABS: 50.0000 ug | ORAL_TABLET | Freq: Every day | ORAL | Status: DC

## 2011-09-13 MED ORDER — PRAVASTATIN SODIUM 40 MG PO TABS: 40.0000 mg | ORAL_TABLET | Freq: Every day | ORAL | Status: DC

## 2011-09-13 MED ORDER — CYCLOBENZAPRINE HCL 10 MG PO TABS: 10.0000 mg | ORAL_TABLET | Freq: Every evening | ORAL | Status: DC | PRN

## 2011-09-13 MED ORDER — HYDROCODONE-ACETAMINOPHEN 5-500 MG PO TABS: 1.0000 | ORAL_TABLET | Freq: Two times a day (BID) | ORAL | Status: DC | PRN

## 2011-09-13 MED ORDER — TRAMADOL HCL 50 MG PO TABS: 50.0000 mg | ORAL_TABLET | Freq: Two times a day (BID) | ORAL | Status: DC | PRN

## 2011-09-13 MED ORDER — OMEPRAZOLE 20 MG PO CPDR: 20.0000 mg | DELAYED_RELEASE_CAPSULE | Freq: Two times a day (BID) | ORAL | Status: DC

## 2011-09-13 MED ORDER — TRIAMTERENE-HCTZ 37.5-25 MG PO TABS: 1.0000 | ORAL_TABLET | Freq: Every day | ORAL | Status: DC

## 2011-09-13 MED ORDER — LORAZEPAM 1 MG PO TABS: 1.0000 mg | ORAL_TABLET | Freq: Every day | ORAL | Status: DC | PRN

## 2011-09-13 MED ORDER — MONTELUKAST SODIUM 10 MG PO TABS: 10.0000 mg | ORAL_TABLET | Freq: Every day | ORAL | Status: DC

## 2011-09-13 NOTE — Progress Notes (Signed)
  Subjective:    Patient ID: [name], female    DOB: 1946/04/20, 66 y.o.   MRN: [id number]  HPI Neck pain  - Completed 2nd round PT. She is much better.  Now using tramadol occ.  She feels she is 80% better from when she originally started.    She needs to look at her meds because her insurance is changning.  Medications prescribed by her oncologist are so expensive that she very quickly get the Medicare gap. She has multiple medications that she would like to get from a local pharmacy in [place] and pay out-of-pocket for them so that it does not get process through her Medicare. She has had a few medication she would like to pay out of pocket for [facility]. We went through each medication and the prescriptions were rewritten for 60 AND 90 DAYS SUPPLIES.   Hyperlipidemia-she did have some muscle aches and pains in her buttock area. She felt it was due to the statin. She did stop it and does a little better  Though she plans on restarting it in retrying it. She would like to be back on it for one month before we recheck her blood work.  Review of Systems     Objective:   Physical Exam  Constitutional: She is oriented to person, place, and time. She appears well-developed and well-nourished.  HENT:  Head: Normocephalic and atraumatic.  Cardiovascular: Normal rate, regular rhythm and normal heart sounds.   Pulmonary/Chest: Effort normal and breath sounds normal.  Neurological: She is alert and oriented to person, place, and time.  Skin: Skin is warm and dry.  Psychiatric: She has a normal mood and affect. Her behavior is normal.          Assessment & Plan:  Cervicalgia-I'm very excited with her progress. I'm glad she so much better. I'm not sure she will get to 100% relief that way she has made some major strides and is using less pain medication. I encouraged her to continue to do her home exercises and to call if she runs into any problems.  Medication management-We went through  each medication and the prescriptions were rewritten for 60 AND 90 DAYS SUPPLIES.   Hyperlipidemia-she is due to recheck her levels on her statin and about a month. She said she will call the office at that time.  Hypothyroid-she is to check a TSH today.  25 minutes spent face-to-face and discussing and reviewing and adjusting her medications today.

## 2011-09-14 ENCOUNTER — Ambulatory Visit: Payer: BC Managed Care – PPO | Admitting: Physical Therapy

## 2011-09-14 ENCOUNTER — Ambulatory Visit: Payer: BC Managed Care – PPO | Attending: Urology | Admitting: Physical Therapy

## 2011-09-14 DIAGNOSIS — M542 Cervicalgia: Secondary | ICD-10-CM | POA: Insufficient documentation

## 2011-09-14 DIAGNOSIS — M6281 Muscle weakness (generalized): Secondary | ICD-10-CM | POA: Insufficient documentation

## 2011-09-14 DIAGNOSIS — X58XXXA Exposure to other specified factors, initial encounter: Secondary | ICD-10-CM | POA: Insufficient documentation

## 2011-09-14 DIAGNOSIS — IMO0001 Reserved for inherently not codable concepts without codable children: Secondary | ICD-10-CM | POA: Insufficient documentation

## 2011-09-15 ENCOUNTER — Ambulatory Visit (INDEPENDENT_AMBULATORY_CARE_PROVIDER_SITE_OTHER): Payer: BC Managed Care – PPO | Admitting: Family Medicine

## 2011-09-15 ENCOUNTER — Encounter: Payer: Self-pay | Admitting: Family Medicine

## 2011-09-15 VITALS — BP 136/84 | HR 113 | Temp 98.2°F | Wt 125.0 lb

## 2011-09-15 DIAGNOSIS — H9209 Otalgia, unspecified ear: Secondary | ICD-10-CM

## 2011-09-15 DIAGNOSIS — J329 Chronic sinusitis, unspecified: Secondary | ICD-10-CM

## 2011-09-15 MED ORDER — CLARITHROMYCIN ER 500 MG PO TB24: 1000.0000 mg | ORAL_TABLET | Freq: Every day | ORAL | Status: AC

## 2011-09-15 NOTE — Progress Notes (Signed)
  Subjective:    Patient ID: [name], female    DOB: April 06, 1946, 66 y.o.   MRN: [id number]  HPI Had virus in December and had nasal congestion for 2 weeks. Then saw Dr. [place] Callas and was put on levaquin 750mg  for 5 days on 08/01/11. Since then has had some intermittant pain and ringing. Has been using mucinex.  Has had a HA off and off for 2 weeks.  Her right ear feels has dec hearing.  This has happened before.  Painful when lays on that ear.  Has had some sinus pressure over her right temple and forehead and over her left cheek. Feels tender to touch.  No fever.    Review of Systems     Objective:   Physical Exam  Constitutional: She is oriented to person, place, and time. She appears well-developed and well-nourished.  HENT:  Head: Normocephalic and atraumatic.  Right Ear: External ear normal.  Left Ear: External ear normal.  Nose: Nose normal.  Mouth/Throat: Oropharynx is clear and moist.       TMs and canals are clear.   Eyes: Conjunctivae and EOM are normal. Pupils are equal, round, and reactive to light.  Neck: Neck supple. No thyromegaly present.  Cardiovascular: Normal rate, regular rhythm and normal heart sounds.   Pulmonary/Chest: Effort normal and breath sounds normal. She has no wheezes.  Lymphadenopathy:    She has no cervical adenopathy.  Neurological: She is alert and oriented to person, place, and time.  Skin: Skin is warm and dry.  Psychiatric: She has a normal mood and affect.          Assessment & Plan:  Otalgia - ear exam is normal today. I suspect that her ear pain is either from eustachian tube dysfunction or possibly pain radiating from her sinuses. She continued her to nasal spray in addition to her nasal saline irrigation. She says sometimes this will help her in her output thus far it has not.  Sinusitis - point in time it is still unclear to me whether or not she has a true or unresolve sinusitis or just having nasal symptoms from her allergic  rhinitis. I will go ahead and put her on Biaxin since she did finish a course of Levaquin over 5 weeks ago. Also consider this may be a new illness but she says she still have persistent drainage and pressure intermittently over the last 5 weeks. If at that point she is not better then I recommend that she followup with her ENT for further evaluation of her sinuses.

## 2011-09-15 NOTE — Patient Instructions (Signed)
Follow up with ENT if not better in 2 weeks.

## 2011-09-21 ENCOUNTER — Ambulatory Visit: Payer: BC Managed Care – PPO | Admitting: Physical Therapy

## 2011-09-27 ENCOUNTER — Other Ambulatory Visit: Payer: Self-pay | Admitting: Family Medicine

## 2011-09-27 ENCOUNTER — Ambulatory Visit: Payer: BC Managed Care – PPO | Admitting: Physical Therapy

## 2011-09-29 ENCOUNTER — Encounter: Payer: Self-pay | Admitting: Physical Therapy

## 2011-10-03 ENCOUNTER — Ambulatory Visit: Payer: Medicare Other | Attending: Family Medicine | Admitting: Physical Therapy

## 2011-10-03 DIAGNOSIS — IMO0001 Reserved for inherently not codable concepts without codable children: Secondary | ICD-10-CM | POA: Insufficient documentation

## 2011-10-03 DIAGNOSIS — X58XXXA Exposure to other specified factors, initial encounter: Secondary | ICD-10-CM | POA: Insufficient documentation

## 2011-10-03 DIAGNOSIS — M6281 Muscle weakness (generalized): Secondary | ICD-10-CM | POA: Insufficient documentation

## 2011-10-03 DIAGNOSIS — M542 Cervicalgia: Secondary | ICD-10-CM | POA: Insufficient documentation

## 2011-10-05 ENCOUNTER — Encounter: Payer: Self-pay | Admitting: Physical Therapy

## 2011-10-06 ENCOUNTER — Other Ambulatory Visit: Payer: Self-pay | Admitting: *Deleted

## 2011-10-06 MED ORDER — NITROFURANTOIN MONOHYD MACRO 100 MG PO CAPS: 100.0000 mg | ORAL_CAPSULE | Freq: Every day | ORAL | Status: DC

## 2011-10-06 NOTE — Telephone Encounter (Signed)
Pt is wanting a prescription for Macrobid refilled and printed out for her to pickup to mail to her pharmacy. Please advise.

## 2011-10-10 ENCOUNTER — Ambulatory Visit: Payer: Medicare Other | Admitting: Physical Therapy

## 2011-10-12 ENCOUNTER — Ambulatory Visit: Payer: Medicare Other | Admitting: Physical Therapy

## 2011-10-13 ENCOUNTER — Other Ambulatory Visit: Payer: Self-pay | Admitting: *Deleted

## 2011-10-13 MED ORDER — HYDROCODONE-ACETAMINOPHEN 5-500 MG PO TABS: 1.0000 | ORAL_TABLET | Freq: Two times a day (BID) | ORAL | Status: DC | PRN

## 2011-10-19 ENCOUNTER — Encounter: Payer: Self-pay | Admitting: Physical Therapy

## 2011-10-21 ENCOUNTER — Ambulatory Visit: Payer: Medicare Other | Admitting: Physical Therapy

## 2011-10-24 ENCOUNTER — Ambulatory Visit: Payer: Medicare Other | Admitting: Physical Therapy

## 2011-10-26 ENCOUNTER — Encounter: Payer: Self-pay | Admitting: Physical Therapy

## 2011-11-04 ENCOUNTER — Encounter: Payer: Medicare Other | Admitting: Physical Therapy

## 2011-11-18 ENCOUNTER — Ambulatory Visit: Payer: Medicare Other | Attending: Family Medicine | Admitting: Physical Therapy

## 2011-11-18 DIAGNOSIS — M6281 Muscle weakness (generalized): Secondary | ICD-10-CM | POA: Insufficient documentation

## 2011-11-18 DIAGNOSIS — M542 Cervicalgia: Secondary | ICD-10-CM | POA: Insufficient documentation

## 2011-11-18 DIAGNOSIS — X58XXXA Exposure to other specified factors, initial encounter: Secondary | ICD-10-CM | POA: Insufficient documentation

## 2011-11-18 DIAGNOSIS — IMO0001 Reserved for inherently not codable concepts without codable children: Secondary | ICD-10-CM | POA: Insufficient documentation

## 2011-11-23 ENCOUNTER — Encounter: Payer: Self-pay | Admitting: Family Medicine

## 2011-11-23 ENCOUNTER — Ambulatory Visit: Payer: Medicare Other | Admitting: Physical Therapy

## 2011-11-23 ENCOUNTER — Ambulatory Visit
Admission: RE | Admit: 2011-11-23 | Discharge: 2011-11-23 | Disposition: A | Discharge status: Home / Self Care | Payer: Medicare Other | Source: Ambulatory Visit | Attending: Family Medicine | Admitting: Family Medicine

## 2011-11-23 ENCOUNTER — Ambulatory Visit (INDEPENDENT_AMBULATORY_CARE_PROVIDER_SITE_OTHER): Payer: Medicare Other | Admitting: Family Medicine

## 2011-11-23 VITALS — BP 133/81 | HR 116 | Temp 98.4°F | Ht 67.0 in | Wt 127.0 lb

## 2011-11-23 DIAGNOSIS — L309 Dermatitis, unspecified: Secondary | ICD-10-CM

## 2011-11-23 DIAGNOSIS — R51 Headache: Secondary | ICD-10-CM

## 2011-11-23 DIAGNOSIS — J329 Chronic sinusitis, unspecified: Secondary | ICD-10-CM

## 2011-11-23 DIAGNOSIS — R519 Headache, unspecified: Secondary | ICD-10-CM

## 2011-11-23 DIAGNOSIS — L259 Unspecified contact dermatitis, unspecified cause: Secondary | ICD-10-CM

## 2011-11-23 MED ORDER — MUPIROCIN 2 % EX OINT: TOPICAL_OINTMENT | Freq: Three times a day (TID) | CUTANEOUS | Status: DC

## 2011-11-23 NOTE — Progress Notes (Signed)
  Subjective:    Patient ID: [name], female    DOB: Norman 17, 1947, 66 y.o.   MRN: [id number]  HPI Sinus sxs. Was treated with 2 rounds of ABX in January for sinusitis.  Got better after biaxin, but failed Levaquin. Went to dentist on 2/13 and was having pain on the left upper maxillary sinus.  Had some old prednisone form Dr. Ingenio Callas. Took a 5 day taper and some mucinex D for about 7 days.  She felt better on the steroid and then started feeling worse about 2-3 weeks ago. Restarted her mucinex D- which hleps some.  Burning/ache in her forehead, bilat. No nasal congestion. Using astelin and omnaris. + post nasal drip.  Some pressure over the left cheek bone.  Has appt with ENT [facility].    Rash Red spot on left nipple again.  Started it a couple of days ago.  Has had before.  We have used oral ABX in the past when it has been more severe. Tender as well. No drianage off luids.    Review of Systems     Objective:   Physical Exam  Constitutional: She is oriented to person, place, and time. She appears well-developed and well-nourished.  HENT:  Head: Normocephalic and atraumatic.  Right Ear: External ear normal.  Left Ear: External ear normal.  Nose: Nose normal.  Mouth/Throat: Oropharynx is clear and moist.       TMs and canals are clear.   Eyes: Conjunctivae and EOM are normal. Pupils are equal, round, and reactive to light.  Neck: Neck supple. No thyromegaly present.  Cardiovascular: Normal rate, regular rhythm and normal heart sounds.   Pulmonary/Chest: Effort normal and breath sounds normal. She has no wheezes.  Lymphadenopathy:    She has no cervical adenopathy.  Neurological: She is alert and oriented to person, place, and time.  Skin: Skin is warm and dry.       Mild erythema on the left nipple with small break in the skin. No discharge or drianage. No spreading erythema.   Psychiatric: She has a normal mood and affect.          Assessment & Plan:  Possible sinusitis -  She has had persistant symptoms. I would like to get a sinus CT to eval if she has evidence of chronic sinusitis in which case she would need 3 or 4 weeks of therapy versus another problem that may be causing some type of obstruction such as a cyst. Or if she may just have another case of acute sinusitis. Continue allergy medications for now. Will prior auth th eCT and call with results when available.   Dermatitis - Will try bactroban ointment. Call if not helping. Avoid rubbing or irritatint the area. Call if any sign of spreading.

## 2011-12-02 ENCOUNTER — Encounter: Payer: Medicare Other | Admitting: Physical Therapy

## 2011-12-07 ENCOUNTER — Ambulatory Visit: Payer: Medicare Other | Attending: Family Medicine | Admitting: Physical Therapy

## 2011-12-07 DIAGNOSIS — IMO0001 Reserved for inherently not codable concepts without codable children: Secondary | ICD-10-CM | POA: Insufficient documentation

## 2011-12-07 DIAGNOSIS — M6281 Muscle weakness (generalized): Secondary | ICD-10-CM | POA: Insufficient documentation

## 2011-12-07 DIAGNOSIS — M542 Cervicalgia: Secondary | ICD-10-CM | POA: Insufficient documentation

## 2011-12-07 DIAGNOSIS — X58XXXA Exposure to other specified factors, initial encounter: Secondary | ICD-10-CM | POA: Insufficient documentation

## 2011-12-19 ENCOUNTER — Other Ambulatory Visit: Payer: Self-pay

## 2011-12-19 DIAGNOSIS — C50919 Malignant neoplasm of unspecified site of unspecified female breast: Secondary | ICD-10-CM

## 2011-12-19 MED ORDER — LETROZOLE 2.5 MG PO TABS: 2.5000 mg | ORAL_TABLET | Freq: Every day | ORAL | Status: DC

## 2011-12-19 NOTE — Telephone Encounter (Signed)
Received call from pt requesting a refill on her letrozole, 90 day supply to be called to [facility] in [place].  Ok per Dr. Darrold Span.  Done.

## 2011-12-22 ENCOUNTER — Telehealth: Payer: Self-pay | Admitting: Oncology

## 2011-12-22 NOTE — Telephone Encounter (Signed)
Talked to pt, she is aware of appt in may 2013

## 2012-01-09 ENCOUNTER — Other Ambulatory Visit: Payer: Self-pay | Admitting: Family Medicine

## 2012-01-09 ENCOUNTER — Encounter: Payer: Self-pay | Admitting: Physician Assistant

## 2012-01-09 ENCOUNTER — Ambulatory Visit (INDEPENDENT_AMBULATORY_CARE_PROVIDER_SITE_OTHER): Payer: Medicare Other | Admitting: Physician Assistant

## 2012-01-09 ENCOUNTER — Telehealth: Payer: Self-pay

## 2012-01-09 VITALS — BP 141/81 | HR 104 | Temp 98.1°F | Ht 67.0 in | Wt 129.0 lb

## 2012-01-09 DIAGNOSIS — W57XXXA Bitten or stung by nonvenomous insect and other nonvenomous arthropods, initial encounter: Secondary | ICD-10-CM

## 2012-01-09 DIAGNOSIS — S30860A Insect bite (nonvenomous) of lower back and pelvis, initial encounter: Secondary | ICD-10-CM

## 2012-01-09 DIAGNOSIS — E785 Hyperlipidemia, unspecified: Secondary | ICD-10-CM

## 2012-01-09 NOTE — Patient Instructions (Addendum)
Use antibiotic cream three times a day for 7 days. Call office if start running a fever or notice a rash. If not resolved in 10 days then call office. Will get lyme blood work and call with results.   Start cholesterol medication again and if same bruising occurs then stop and call office otherwise let's get a lipid test in 3 months. Make sure you are fasting for 8 hours before test.

## 2012-01-09 NOTE — Telephone Encounter (Signed)
Told [name] that a cbc and chemistries will be drawn at visit 01-11-12.  Told her that Dr. Linford Arnold would be able to view the results in Epic.  It may be to soon to show any effects of her cholesterol medication as she was told to restart it today.  Brusing was what pt. Reported to PCP and stopped cholesterol med.  Pt. Verbalized understanding.

## 2012-01-09 NOTE — Progress Notes (Signed)
  Subjective:    Patient ID: [name], female    DOB: May 12, 1946, 66 y.o.   MRN: [id number]  HPI 2 days ago patient pulled tick offher left upper chest area. There still remains something where the tick was. It has gotten red around the bite area. She denies any fever, chills, muscle aches. She has not notice any rash. She has put neosporin and hydrogen peroxide on wound. It has not gotten worse.   She is supposed to see Dr. Linford Arnold for cholesterol recheck. She has not been taking her medication nor been making diet and exercise changes.      Review of Systems     Objective:   Physical Exam  Constitutional: She is oriented to person, place, and time. She appears well-developed and well-nourished.  HENT:  Head: Normocephalic and atraumatic.  Cardiovascular: Regular rhythm and normal heart sounds.        Tachycardia is 104.  Pulmonary/Chest: Effort normal and breath sounds normal. She has no wheezes.  Neurological: She is alert and oriented to person, place, and time.  Skin: Skin is warm and dry.       1mm area of erythema with a black spec in the center. No other rash over the body.  Removed black left over portion of tick with tweezers.   Psychiatric: She has a normal mood and affect. Her behavior is normal.          Assessment & Plan:  Tick bite- Use antibiotic cream three times a day for 7 days. Patient has antibiotic cream given for by previous doctor. Call office if start running a fever or notice a rash. If not resolved in 10 days then call office. Will get lyme blood work and call with results.   Hyperlipidemia- Is supposed to follow up with Dr. Linford Arnold this Thursday. She has not been taking medications and not been making diet or exercise changes. I encouraged her to start medication again and stop med if same briusing occurs. Call office and we can try something else. Also add diet and exercise changes and then recheck in 3 months. Reiterated a cholesterol diet  today.

## 2012-01-10 ENCOUNTER — Other Ambulatory Visit: Payer: Self-pay | Admitting: Oncology

## 2012-01-11 ENCOUNTER — Ambulatory Visit (HOSPITAL_BASED_OUTPATIENT_CLINIC_OR_DEPARTMENT_OTHER): Payer: Medicare Other | Admitting: Oncology

## 2012-01-11 ENCOUNTER — Encounter: Payer: Self-pay | Admitting: Oncology

## 2012-01-11 ENCOUNTER — Other Ambulatory Visit (HOSPITAL_BASED_OUTPATIENT_CLINIC_OR_DEPARTMENT_OTHER): Payer: Medicare Other | Admitting: Lab

## 2012-01-11 VITALS — BP 136/80 | HR 92 | Temp 97.7°F | Ht 67.0 in | Wt 129.7 lb

## 2012-01-11 DIAGNOSIS — C50919 Malignant neoplasm of unspecified site of unspecified female breast: Secondary | ICD-10-CM

## 2012-01-11 DIAGNOSIS — M810 Age-related osteoporosis without current pathological fracture: Secondary | ICD-10-CM

## 2012-01-11 DIAGNOSIS — Z79811 Long term (current) use of aromatase inhibitors: Secondary | ICD-10-CM

## 2012-01-11 LAB — CBC WITH DIFFERENTIAL/PLATELET
Basophils Absolute: 0 10*3/uL (ref 0.0–0.1)
Eosinophils Absolute: 0.1 10*3/uL (ref 0.0–0.5)
HCT: 37.6 % (ref 34.8–46.6)
HGB: 12.8 g/dL (ref 11.6–15.9)
LYMPH%: 25.5 % (ref 14.0–49.7)
MONO#: 0.5 10*3/uL (ref 0.1–0.9)
NEUT#: 2.4 10*3/uL (ref 1.5–6.5)
Platelets: 270 10*3/uL (ref 145–400)
RBC: 4.01 10*6/uL (ref 3.70–5.45)
WBC: 4 10*3/uL (ref 3.9–10.3)
nRBC: 0 % (ref 0–0)

## 2012-01-11 LAB — COMPREHENSIVE METABOLIC PANEL
ALT: 9 U/L (ref 0–35)
CO2: 33 mEq/L — ABNORMAL HIGH (ref 19–32)
Calcium: 9.2 mg/dL (ref 8.4–10.5)
Chloride: 98 mEq/L (ref 96–112)
Creatinine, Ser: 0.72 mg/dL (ref 0.50–1.10)
Total Protein: 5.9 g/dL — ABNORMAL LOW (ref 6.0–8.3)

## 2012-01-11 NOTE — Progress Notes (Signed)
OFFICE PROGRESS NOTE Date of Visit 01-11-12 Physicians:   INTERVAL HISTORY:  Patient is seen, alone for visit, in scheduled follow up of her history of breast cancer, continuing Femara as this was recurrent to internal mammary node in August 2007.  History is of T1N0 left breast cancer in 1999, treated with lumpectomy and sentinel node evaluation, local radiation and 5 years of tamoxifen. She was found to have apparent isolated metastatic disease to internal mammary node in August 2007, this discovered on breast MRI. She was treated with taxotere and Xeloda initially, then resection of the node (path still positive) and has continued Femara since Feb 2008. She has had no known further active disease. She has osteoporosis, last bone density scan done at [facility] 03-04-2011. With known low bone density and on the chronic high risk aromatase inhibitor, it is medically necessary to have bone density scan repeated yearly. Vitamin D level was good at 49 in May 2012 and she is on calcium. Last MRI breast was 08-12-10 and last mammograms 08-08-11.  Patient has felt generally well in past few months. She had a tick bite earlier this week and is being screened for Lyme disease, and is being treated for possible Meniere's disease by Dr Suszanne Conners, as hearing in right ear is worse. She manages neck and upper back muscle spasm with massage as needed, and has had significant improvement in pelvic muscle spasms with PT now completed. She has had no recent fever or symptoms of infection, allergy symptoms are not significant now. She denies new or different chest pain or other pain, shortness of breath or changes on breast self exam.  Remainder of 10 point Review of Systems negative/ unchanged.  Objective:  Vital signs in last 24 hours:  BP 136/80  Pulse 92  Temp(Src) 97.7 F (36.5 C) (Oral)  Ht 5\' 7"  (1.702 m)  Wt 129 lb 11.2 oz (58.832 kg)  BMI 20.31 kg/m2 Easily mobile, looks comfortable.  HEENT:mucous membranes  moist, pharynx normal without lesions LymphaticsCervical, supraclavicular, and axillary nodes normal. Resp: clear to auscultation bilaterally and normal percussion bilaterally Cardio: regular rate and rhythm GI: soft, non-tender; bowel sounds normal; no masses,  no organomegaly Extremities: extremities normal, atraumatic, no cyanosis or edema Neuro:decreased hearing right ear Erythema around site of previous tick bite left anterior axillary fold, + erythema from tape there. No target lesion. Skin otherwise without rash or petechiae. Left breast with well-healed lumpectomy scar, no dominant mass, no skin changes of concern, no nipple findings. Smooth rounded 0.5 cm cyst nontender 8:00. Nothing left axilla, no swelling LUE. Right breast without mass, skin or nipple findings, axilla benign. Lab Results:   [facility] 01/11/12 0922  WBC 4.0  HGB 12.8  HCT 37.6  PLT 270   ANC 2.4 BMET CMET available after visit normal with exception of CO2 33 and t protein 5.9 Studies/Results:  No results found.  Medications: I have reviewed the patient's current medications.  Assessment/Plan: 1. Breast carcinoma: history as above, recurrent to internal mammary node in 2007, post treatment as above and continuing Femara because of risk of further progression of disease. I will see her back in ~4 mo or sooner if needed. Repeat bone density scan July. 2.Osteoporosis: yearly bone density necessary 3.recent tick bite 4.possible Meniere's disease. She had brain scans in ~ 2007 in my records and believes subsequently by neurologist also. 5.environmental allergies        Prisha Hiley P, MD   01/11/2012, 12:18 PM

## 2012-01-11 NOTE — Patient Instructions (Signed)
Appointments as scheduled.  From standpoint of bruising, seems fine to restart pravastatin

## 2012-01-12 ENCOUNTER — Ambulatory Visit: Payer: Self-pay | Admitting: Family Medicine

## 2012-01-15 ENCOUNTER — Encounter: Payer: Self-pay | Admitting: Oncology

## 2012-01-15 NOTE — Progress Notes (Signed)
Per [facility] scheduler, bone density scan will not be covered by her insurance this year.  Email to D.Clark at [facility] to find out if [employer] is coding correctly for chronic use of high risk medication, which should cover this medically necessary bone density scan; and, if coding is not the problem, what is?

## 2012-01-18 ENCOUNTER — Telehealth: Payer: Self-pay | Admitting: Oncology

## 2012-01-18 NOTE — Telephone Encounter (Signed)
Talked to pt, gave her appt time [facility], explained to pt they insurance guidelines for scan is every two years and they might not pay for the scan. Also emphasize to pt that per MD it is medically necessary,. Pt asked how much it would cost, advised her to call [facility] for  clarification.

## 2012-01-20 ENCOUNTER — Telehealth: Payer: Self-pay | Admitting: Family Medicine

## 2012-01-20 ENCOUNTER — Telehealth: Payer: Self-pay | Admitting: Oncology

## 2012-01-20 NOTE — Telephone Encounter (Signed)
Call pt: Now she is 66 guidelines recommend a pneumonia vaccine. Can shedule anytime for nurse visit for vaccine.

## 2012-01-24 NOTE — Telephone Encounter (Signed)
Left message on vm

## 2012-02-27 ENCOUNTER — Ambulatory Visit (INDEPENDENT_AMBULATORY_CARE_PROVIDER_SITE_OTHER): Payer: Medicare Other | Admitting: Family Medicine

## 2012-02-27 ENCOUNTER — Encounter: Payer: Self-pay | Admitting: Family Medicine

## 2012-02-27 VITALS — BP 136/78 | HR 72 | Ht 67.0 in | Wt 127.0 lb

## 2012-02-27 DIAGNOSIS — M542 Cervicalgia: Secondary | ICD-10-CM

## 2012-02-27 DIAGNOSIS — M546 Pain in thoracic spine: Secondary | ICD-10-CM

## 2012-02-27 DIAGNOSIS — N61 Mastitis without abscess: Secondary | ICD-10-CM

## 2012-02-27 DIAGNOSIS — M549 Dorsalgia, unspecified: Secondary | ICD-10-CM

## 2012-02-27 NOTE — Patient Instructions (Addendum)

## 2012-02-27 NOTE — Progress Notes (Signed)
  Subjective:    Patient ID: [name], female    DOB: August 06, 1946, 66 y.o.   MRN: [id number]  HPI Nipple is red on the left. Hx of recurrent mastitis on that same breast. No fever.  Started using mupiricin oint TID about 5 days ago.    Neck and shoulder pain. Hasn't had PT for the last 6 months.  She is responding well to massage on her own.  But then about 8 days ago started getting more severe. Worse with laying down. Not sure if re-injured with clearning or lifting her grand baby.  Using tramadol and 2 vicodin a day for relief.  Didn't like the muscle relaxer, didn't like how it felt.  No radiation into her arms.    Review of Systems     Objective:   Physical Exam  Musculoskeletal:       Neck with normal flexion. Decreased extension. Normal rotation right and left with normal side bending. She is tender along the cervical paraspinous muscles. She is nontender over the cervical spine. She's also very tight and have spasms between the shoulder blades and the spine. Shoulders with normal range of motion.  Skin:       Left nipple is slightly erythematous. No crakcing or drainage or discharge. No redness onto the breast tissue.           Assessment & Plan:  Mastitis - Very mild. Call if not better by THursday. Continue the mupiricin ointment TID.  If not better by Thursday, or if it certainly looks worse then please call the office and we'll call an antibiotic.  Neck and upper back pain - Discussed PT vs massage therapy for maintenance. Will continue stretches and meds at home. If not improving into next week will call and we can schedule for PT.  Discussed  Using a tennis ball on the trigger spots can help as well.  Continue home exercises for now.

## 2012-03-05 ENCOUNTER — Ambulatory Visit: Payer: Medicare Other | Admitting: Oncology

## 2012-03-08 ENCOUNTER — Other Ambulatory Visit: Payer: Self-pay | Admitting: Family Medicine

## 2012-03-08 ENCOUNTER — Ambulatory Visit
Admission: RE | Admit: 2012-03-08 | Discharge: 2012-03-08 | Disposition: A | Discharge status: Home / Self Care | Payer: Medicare Other | Source: Ambulatory Visit | Attending: Oncology | Admitting: Oncology

## 2012-03-08 DIAGNOSIS — C50919 Malignant neoplasm of unspecified site of unspecified female breast: Secondary | ICD-10-CM

## 2012-03-13 ENCOUNTER — Other Ambulatory Visit: Payer: Self-pay | Admitting: *Deleted

## 2012-03-13 ENCOUNTER — Other Ambulatory Visit: Payer: Self-pay | Admitting: Family Medicine

## 2012-03-13 DIAGNOSIS — C50919 Malignant neoplasm of unspecified site of unspecified female breast: Secondary | ICD-10-CM

## 2012-03-13 MED ORDER — ALENDRONATE SODIUM 70 MG PO TABS: 70.0000 mg | ORAL_TABLET | ORAL | Status: DC

## 2012-03-13 MED ORDER — LETROZOLE 2.5 MG PO TABS: 2.5000 mg | ORAL_TABLET | Freq: Every day | ORAL | Status: DC

## 2012-03-15 ENCOUNTER — Encounter: Payer: Self-pay | Admitting: Family Medicine

## 2012-03-15 ENCOUNTER — Ambulatory Visit (INDEPENDENT_AMBULATORY_CARE_PROVIDER_SITE_OTHER): Payer: Medicare Other | Admitting: Family Medicine

## 2012-03-15 ENCOUNTER — Other Ambulatory Visit: Payer: Self-pay | Admitting: *Deleted

## 2012-03-15 VITALS — BP 116/65 | HR 94 | Wt 130.0 lb

## 2012-03-15 DIAGNOSIS — E785 Hyperlipidemia, unspecified: Secondary | ICD-10-CM

## 2012-03-15 DIAGNOSIS — N390 Urinary tract infection, site not specified: Secondary | ICD-10-CM

## 2012-03-15 DIAGNOSIS — R350 Frequency of micturition: Secondary | ICD-10-CM

## 2012-03-15 DIAGNOSIS — IMO0001 Reserved for inherently not codable concepts without codable children: Secondary | ICD-10-CM

## 2012-03-15 LAB — POCT URINALYSIS DIPSTICK
Nitrite, UA: POSITIVE
Protein, UA: NEGATIVE
pH, UA: 7

## 2012-03-15 MED ORDER — CEFDINIR 300 MG PO CAPS: 300.0000 mg | ORAL_CAPSULE | Freq: Two times a day (BID) | ORAL | Status: DC

## 2012-03-15 MED ORDER — SULFAMETHOXAZOLE-TMP DS 800-160 MG PO TABS: 1.0000 | ORAL_TABLET | Freq: Two times a day (BID) | ORAL | Status: AC

## 2012-03-15 MED ORDER — PHENAZOPYRIDINE HCL 200 MG PO TABS: 200.0000 mg | ORAL_TABLET | Freq: Three times a day (TID) | ORAL | Status: AC | PRN

## 2012-03-15 MED ORDER — CEFTRIAXONE SODIUM 1 G IJ SOLR: 1.0000 g | Freq: Once | INTRAMUSCULAR | Status: DC

## 2012-03-15 MED ORDER — PITAVASTATIN CALCIUM 4 MG PO TABS: ORAL_TABLET | ORAL | Status: DC

## 2012-03-15 NOTE — Progress Notes (Signed)
  Subjective:    Patient ID: [name], female    DOB: 1946/02/20, 66 y.o.   MRN: [id number]  Urinary Frequency  This is a new problem. The current episode started yesterday. The problem occurs every urination. The quality of the pain is described as shooting. The pain is moderate. There has been no fever. Associated symptoms include frequency and urgency. Pertinent negatives include no chills, discharge, flank pain, hematuria, hesitancy, nausea, sweats or vomiting. Treatments tried: Pyridium. The treatment provided mild relief. Her past medical history is significant for recurrent UTIs and a urological procedure. patient had a bladder tacking several years ago and has had recurrent blader issues. She is on chronic macrobid daily.   Review of Systems  Constitutional: Negative.  Negative for chills.  Gastrointestinal: Negative for nausea and vomiting.  Genitourinary: Positive for dysuria, urgency, frequency and difficulty urinating. Negative for hesitancy, hematuria and flank pain.  All other systems reviewed and are negative.      BP 116/65  Pulse 94  Wt 130 lb (58.968 kg) Objective:   Physical Exam  Constitutional: She is oriented to person, place, and time. She appears well-developed and well-nourished.  HENT:  Head: Normocephalic and atraumatic.  Abdominal: Soft. She exhibits no distension. There is no tenderness.  Musculoskeletal: Normal range of motion.  Neurological: She is alert and oriented to person, place, and time.  Skin: Skin is warm and dry.  Psychiatric: She has a normal mood and affect. Her behavior is normal.      Results for orders placed in visit on 03/15/12  POCT URINALYSIS DIPSTICK      Component Value Range   Color, UA yellow     Clarity, UA clear     Glucose, UA neg     Bilirubin, UA neg     Ketones, UA neg     Spec Grav, UA 1.015     Blood, UA neg     pH, UA 7.0     Protein, UA neg     Urobilinogen, UA 0.2     Nitrite, UA pos     Leukocytes, UA  small (1+)     Assessment & Plan:  #1 Patient has had UTI frequently last year she said that she had several antibiotics here failed and needed a shot of Rocephin and 5 days of an oral antibiotic. She is allergic to [facility] but was able to take  3rd generation cephalosporin all right. Will add pyridium as well since she took an older tablet last night  #2 Addendum: she has started red yeast and clover to replace her Pravachol since this is a mild statin and she has had problems recommend she sees [name] in 2 months and will place her on  Livalo 4 mg 1/2 tablet a day samples and scrip's since she has already stopped her Pravachol.

## 2012-03-15 NOTE — Addendum Note (Signed)
Addended by: Meyer Cory R on: 03/15/2012 01:05 PM   Modules accepted: Orders

## 2012-03-15 NOTE — Patient Instructions (Addendum)
utiUrinary Tract Infection Infections of the urinary tract can start in several places. A bladder infection (cystitis), a kidney infection (pyelonephritis), and a prostate infection (prostatitis) are different types of urinary tract infections (UTIs). They usually get better if treated with medicines (antibiotics) that kill germs. Take all the medicine until it is gone. You or your child may feel better in a few days, but TAKE ALL MEDICINE or the infection may not respond and may become more difficult to treat. HOME CARE INSTRUCTIONS   Drink enough water and fluids to keep the urine clear or pale yellow. Cranberry juice is especially recommended, in addition to large amounts of water.   Avoid caffeine, tea, and carbonated beverages. They tend to irritate the bladder.   Alcohol may irritate the prostate.   Only take over-the-counter or prescription medicines for pain, discomfort, or fever as directed by your caregiver.  To prevent further infections:  Empty the bladder often. Avoid holding urine for long periods of time.   After a bowel movement, women should cleanse from front to back. Use each tissue only once.   Empty the bladder before and after sexual intercourse.  FINDING OUT THE RESULTS OF YOUR TEST Not all test results are available during your visit. If your or your child's test results are not back during the visit, make an appointment with your caregiver to find out the results. Do not assume everything is normal if you have not heard from your caregiver or the medical facility. It is important for you to follow up on all test results. SEEK MEDICAL CARE IF:   There is back pain.   Your baby is older than 3 months with a rectal temperature of 100.5 F (38.1 C) or higher for more than 1 day.   Your or your child's problems (symptoms) are no better in 3 days. Return sooner if you or your child is getting worse.  SEEK IMMEDIATE MEDICAL CARE IF:   There is severe back pain or lower  abdominal pain.   You or your child develops chills.   You have a fever.   Your baby is older than 3 months with a rectal temperature of 102 F (38.9 C) or higher.   Your baby is 48 months old or younger with a rectal temperature of 100.4 F (38 C) or higher.   There is nausea or vomiting.   There is continued burning or discomfort with urination.  MAKE SURE YOU:   Understand these instructions.   Will watch your condition.   Will get help right away if you are not doing well or get worse.  Document Released: 05/25/2005 Document Revised: 08/04/2011 Document Reviewed: 12/28/2006 [facility] Patient Information 2012 [place], [place].

## 2012-04-06 ENCOUNTER — Other Ambulatory Visit: Payer: Self-pay | Admitting: Family Medicine

## 2012-04-16 ENCOUNTER — Other Ambulatory Visit: Payer: Self-pay | Admitting: Family Medicine

## 2012-05-16 ENCOUNTER — Other Ambulatory Visit: Payer: Self-pay

## 2012-05-16 ENCOUNTER — Ambulatory Visit (HOSPITAL_BASED_OUTPATIENT_CLINIC_OR_DEPARTMENT_OTHER): Payer: Medicare Other | Admitting: Oncology

## 2012-05-16 ENCOUNTER — Encounter: Payer: Self-pay | Admitting: Oncology

## 2012-05-16 ENCOUNTER — Other Ambulatory Visit (HOSPITAL_BASED_OUTPATIENT_CLINIC_OR_DEPARTMENT_OTHER): Payer: Medicare Other | Admitting: Lab

## 2012-05-16 VITALS — BP 135/79 | HR 95 | Temp 97.2°F | Resp 20 | Ht 67.0 in | Wt 123.3 lb

## 2012-05-16 DIAGNOSIS — C50919 Malignant neoplasm of unspecified site of unspecified female breast: Secondary | ICD-10-CM

## 2012-05-16 DIAGNOSIS — M810 Age-related osteoporosis without current pathological fracture: Secondary | ICD-10-CM

## 2012-05-16 LAB — CBC WITH DIFFERENTIAL/PLATELET
Basophils Absolute: 0 10*3/uL (ref 0.0–0.1)
EOS%: 1.3 % (ref 0.0–7.0)
HCT: 40.2 % (ref 34.8–46.6)
HGB: 13.6 g/dL (ref 11.6–15.9)
MCH: 31.5 pg (ref 25.1–34.0)
MCV: 93.4 fL (ref 79.5–101.0)
MONO%: 7.2 % (ref 0.0–14.0)
NEUT%: 53.2 % (ref 38.4–76.8)
Platelets: 208 10*3/uL (ref 145–400)

## 2012-05-16 LAB — COMPREHENSIVE METABOLIC PANEL (CC13)
AST: 23 U/L (ref 5–34)
Alkaline Phosphatase: 49 U/L (ref 40–150)
BUN: 13 mg/dL (ref 7.0–26.0)
Calcium: 9.8 mg/dL (ref 8.4–10.4)
Chloride: 99 mEq/L (ref 98–107)
Creatinine: 0.8 mg/dL (ref 0.6–1.1)

## 2012-05-16 MED ORDER — LETROZOLE 2.5 MG PO TABS: 2.5000 mg | ORAL_TABLET | Freq: Every day | ORAL | Status: DC

## 2012-05-16 NOTE — Patient Instructions (Signed)
Begin fosamax as instructed by PCP
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
## 2012-05-17 ENCOUNTER — Telehealth: Payer: Self-pay | Admitting: Oncology

## 2012-05-17 NOTE — Telephone Encounter (Signed)
Talked to patient gave her appt for Mammogram in December 2013 and see MD on February 2014

## 2012-05-24 ENCOUNTER — Other Ambulatory Visit: Payer: Self-pay | Admitting: *Deleted

## 2012-05-24 MED ORDER — NITROFURANTOIN MONOHYD MACRO 100 MG PO CAPS: 100.0000 mg | ORAL_CAPSULE | Freq: Every day | ORAL | Status: DC

## 2012-06-01 ENCOUNTER — Ambulatory Visit: Payer: Medicare Other | Admitting: Physical Therapy

## 2012-06-06 ENCOUNTER — Ambulatory Visit: Payer: Medicare Other | Attending: Urology | Admitting: Physical Therapy

## 2012-06-06 ENCOUNTER — Ambulatory Visit: Payer: Medicare Other | Admitting: Oncology

## 2012-06-06 DIAGNOSIS — M629 Disorder of muscle, unspecified: Secondary | ICD-10-CM | POA: Insufficient documentation

## 2012-06-06 DIAGNOSIS — M2420 Disorder of ligament, unspecified site: Secondary | ICD-10-CM | POA: Insufficient documentation

## 2012-06-06 DIAGNOSIS — IMO0001 Reserved for inherently not codable concepts without codable children: Secondary | ICD-10-CM | POA: Insufficient documentation

## 2012-06-07 ENCOUNTER — Ambulatory Visit: Payer: Medicare Other | Admitting: Physical Therapy

## 2012-06-11 ENCOUNTER — Ambulatory Visit: Payer: Medicare Other | Admitting: Physical Therapy

## 2012-06-12 ENCOUNTER — Ambulatory Visit: Payer: Medicare Other | Admitting: Physical Therapy

## 2012-06-27 ENCOUNTER — Ambulatory Visit: Payer: Medicare Other | Admitting: Physical Therapy

## 2012-07-03 ENCOUNTER — Other Ambulatory Visit: Payer: Self-pay | Admitting: Family Medicine

## 2012-07-04 ENCOUNTER — Encounter: Payer: Medicare Other | Admitting: Physical Therapy

## 2012-07-04 ENCOUNTER — Other Ambulatory Visit: Payer: Self-pay | Admitting: Family Medicine

## 2012-07-12 ENCOUNTER — Telehealth: Payer: Self-pay | Admitting: *Deleted

## 2012-07-12 NOTE — Telephone Encounter (Signed)
Patient called c/o he is getting worse and has a temp 101.

## 2012-07-12 NOTE — Telephone Encounter (Signed)
Wrong chart

## 2012-07-14 ENCOUNTER — Encounter: Payer: Self-pay | Admitting: *Deleted

## 2012-07-14 ENCOUNTER — Emergency Department
Admission: EM | Admit: 2012-07-14 | Discharge: 2012-07-14 | Disposition: A | Discharge status: Home / Self Care | Payer: Medicare Other | Source: Home / Self Care | Attending: Emergency Medicine | Admitting: Emergency Medicine

## 2012-07-14 DIAGNOSIS — J329 Chronic sinusitis, unspecified: Secondary | ICD-10-CM

## 2012-07-14 DIAGNOSIS — J069 Acute upper respiratory infection, unspecified: Secondary | ICD-10-CM

## 2012-07-14 MED ORDER — AMOXICILLIN-POT CLAVULANATE 875-125 MG PO TABS: 1.0000 | ORAL_TABLET | Freq: Two times a day (BID) | ORAL | Status: DC

## 2012-07-14 NOTE — ED Provider Notes (Signed)
History     CSN: [id number]  Arrival date & time 07/14/12  1057   First MD Initiated Contact with Patient 07/14/12 1059      Chief Complaint  Patient presents with  . Sinusitis    (Consider location/radiation/quality/duration/timing/severity/associated sxs/prior treatment) HPI [name] is a 66 y.o. female who complains of onset of cold symptoms for 8 days.  The symptoms are constant and mild-moderate in severity.  Worsening since yesterday.  Husband also sick with bronchitis. No sore throat + cough No pleuritic pain No wheezing + nasal congestion + post-nasal drainage ++ sinus pain/pressure No chest congestion No itchy/red eyes No earache No hemoptysis No SOB No chills/sweats No fever No nausea No vomiting No abdominal pain No diarrhea No skin rashes No fatigue No myalgias + headache    Past Medical History  Diagnosis Date  . Hyperlipidemia   . Recurrent sinusitis   . Breast cancer 1999    T1N0 LEFT BREAST  . 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 Osteoporosis 03-04-2011     LAST BONE DENSITY 03-04-2011  . Trigeminal neuralgia   . Degenerative disc disease   . History of bladder infections     RECURRENT    Past Surgical History  Procedure Date  . Abdominal hysterectomy 2005    complete  . Bladder suspension 2005  . Left breast lumpectomy 1999  . Cancerous ln removed 2008  . Breast surgery     Family History  Problem Relation Age of Onset  . Cancer Mother     breast  . Stroke Mother   . Hyperlipidemia Mother   . Cancer Father     bladder  . Stroke Other   . Hyperlipidemia Other   . Cancer Other     breast    History  Substance Use Topics  . Smoking status: Never Smoker   . Smokeless tobacco: Not on file  . Alcohol Use: No    OB History    Grav Para Term Preterm Abortions TAB SAB Ect Mult Living                  Review of Systems  All other systems reviewed and are  negative.    Allergies  Cefadroxil; Doxycycline; Simvastatin; and Dicloxacillin  Home Medications   Current Outpatient Rx  Name  Route  Sig  Dispense  Refill  . ALENDRONATE SODIUM 70 MG PO TABS   Oral   Take 1 tablet (70 mg total) by mouth every 7 (seven) days. Take with a full glass of water on an empty stomach.   12 tablet   3   . AMOXICILLIN-POT CLAVULANATE 875-125 MG PO TABS   Oral   Take 1 tablet by mouth 2 (two) times daily.   16 tablet   0   . AMOXICILLIN-POT CLAVULANATE 875-125 MG PO TABS   Oral   Take 1 tablet by mouth 2 (two) times daily.   16 tablet   0   . AZELASTINE HCL 137 MCG/SPRAY NA SOLN   Nasal   Place 2 sprays into the nose Twice daily.         Marland Kitchen CALCIUM 1250 MG PO TABS   Oral   Take by mouth.           Marland Kitchen VITAMIN D 1000 UNITS PO TABS   Oral   Take 1,000 Units by mouth daily.           Marland Kitchen  CYCLOBENZAPRINE HCL 10 MG PO TABS      TAKE ONE TABLET BY MOUTH EVERY DAY AT BEDTIME AS NEEDED FOR MUSCLE SPASM   90 tablet   0   . OMEGA-3 FATTY ACIDS 1000 MG PO CAPS   Oral   Take 2 g by mouth daily.           Marland Kitchen FLUTICASONE PROPIONATE 50 MCG/ACT NA SUSP   Nasal   Place 2 sprays into the nose 2 (two) times daily.         Marland Kitchen HYDROCODONE-ACETAMINOPHEN 5-500 MG PO TABS      TAKE ONE TABLET BY MOUTH EVERY 12 HOURS AS NEEDED FOR PAIN   180 tablet   0     Generic ZOX:WRUEAVW 5-500MG  Generic UJW:JXBJYNW 5- ...   . LETROZOLE 2.5 MG PO TABS   Oral   Take 1 tablet (2.5 mg total) by mouth daily.   30 tablet   5   . LEVOCETIRIZINE DIHYDROCHLORIDE 5 MG PO TABS   Oral   Take 5 mg by mouth daily.           Marland Kitchen LEVOTHYROXINE SODIUM 50 MCG PO TABS      TAKE ONE TABLET BY MOUTH EVERY DAY   90 tablet   0   . LORAZEPAM 1 MG PO TABS      TAKE ONE TABLET BY MOUTH EVERY DAY AS NEEDED   180 tablet   0     Generic GNF:AOZHYQ 1MG    . MECLIZINE HCL 25 MG PO TABS      TAKE 1 TABLET UP TO TWO TO THREE TIMES A DAY FOR VERTIGO   30 tablet   1    . MELOXICAM 7.5 MG PO TABS      take 1 tablet by mouth once daily   30 tablet   3   . MONTELUKAST SODIUM 10 MG PO TABS   Oral   Take 1 tablet (10 mg total) by mouth at bedtime.   180 tablet   1   . NITROFURANTOIN MONOHYD MACRO 100 MG PO CAPS   Oral   Take 1 capsule (100 mg total) by mouth daily.   90 capsule   1   . OLOPATADINE HCL 0.2 % OP SOLN   Ophthalmic   Apply 1 drop to eye daily as needed.          Marland Kitchen OMEPRAZOLE 20 MG PO CPDR   Oral   Take 1 capsule (20 mg total) by mouth 2 (two) times daily.   360 capsule   1   . PRAVASTATIN SODIUM 40 MG PO TABS   Oral   Take 1 tablet (40 mg total) by mouth at bedtime.   90 tablet   1   . RED YEAST RICE 600 MG PO CAPS   Oral   Take by mouth.           . TRAMADOL HCL 50 MG PO TABS      TAKE ONE TABLET BY MOUTH TWICE DAILY AS NEEDED FOR PAIN   360 tablet   0     Generic MVH:QIONGE 50MG    . TRIAMTERENE-HCTZ 37.5-25 MG PO TABS      TAKE ONE TABLET BY MOUTH EVERY DAY   90 tablet   0     BP 129/79  Pulse 103  Temp 98 F (36.7 C) (Oral)  Resp 16  Ht 5\' 7"  (1.702 m)  Wt 122 lb 8 oz (55.566 kg)  BMI 19.19 kg/m2  SpO2 97%  Physical Exam  Nursing note and vitals reviewed. Constitutional: She is oriented to person, place, and time. She appears well-developed and well-nourished.  HENT:  Head: Normocephalic and atraumatic.  Right Ear: Tympanic membrane, external ear and ear canal normal.  Left Ear: Tympanic membrane, external ear and ear canal normal.  Nose: Mucosal edema and rhinorrhea present. Right sinus exhibits maxillary sinus tenderness.  Mouth/Throat: Posterior oropharyngeal erythema present. No oropharyngeal exudate or posterior oropharyngeal edema.  Eyes: No scleral icterus.  Neck: Neck supple.  Cardiovascular: Regular rhythm and normal heart sounds.   Pulmonary/Chest: Effort normal and breath sounds normal. No respiratory distress. She has no decreased breath sounds. She has no wheezes. She has no  rhonchi.  Neurological: She is alert and oriented to person, place, and time.  Skin: Skin is warm and dry.  Psychiatric: She has a normal mood and affect. Her speech is normal.    ED Course  Procedures (including critical care time)  Labs Reviewed - No data to display No results found.   1. Acute upper respiratory infections of unspecified site   2. Sinusitis       MDM  1)  Take the prescribed antibiotic as instructed. 2)  Use nasal saline solution (over the counter) at least 3 times a day. 3)  Use over the counter decongestants like Zyrtec-D every 12 hours as needed to help with congestion.  If you have hypertension, do not take medicines with sudafed.  4)  Can take tylenol every 6 hours or motrin every 8 hours for pain or fever. 5)  Follow up with your primary doctor if no improvement in 5-7 days, sooner if increasing pain, fever, or new symptoms.     Marlaine Hind, MD 07/14/12 [phone]

## 2012-07-14 NOTE — ED Notes (Signed)
Patient c/o sinus pressure, headache, and runny nose yellow, clear.

## 2012-07-17 ENCOUNTER — Other Ambulatory Visit: Payer: Self-pay | Admitting: Family Medicine

## 2012-07-18 ENCOUNTER — Other Ambulatory Visit: Payer: Self-pay | Admitting: Family Medicine

## 2012-08-08 ENCOUNTER — Ambulatory Visit
Admission: RE | Admit: 2012-08-08 | Discharge: 2012-08-08 | Disposition: A | Discharge status: Home / Self Care | Payer: Medicare Other | Source: Ambulatory Visit | Attending: Oncology | Admitting: Oncology

## 2012-08-08 DIAGNOSIS — C50919 Malignant neoplasm of unspecified site of unspecified female breast: Secondary | ICD-10-CM

## 2012-08-14 ENCOUNTER — Other Ambulatory Visit: Payer: Self-pay | Admitting: *Deleted

## 2012-08-15 ENCOUNTER — Other Ambulatory Visit: Payer: Self-pay | Admitting: Oncology

## 2012-08-27 ENCOUNTER — Other Ambulatory Visit: Payer: Self-pay | Admitting: Oncology

## 2012-08-27 DIAGNOSIS — R928 Other abnormal and inconclusive findings on diagnostic imaging of breast: Secondary | ICD-10-CM

## 2012-08-30 ENCOUNTER — Telehealth: Payer: Self-pay

## 2012-08-30 NOTE — Telephone Encounter (Signed)
Faxed signed orders to the [facility] and sent a copy to HIM to be scanned into EMR.

## 2012-09-04 ENCOUNTER — Other Ambulatory Visit: Payer: Self-pay | Admitting: Family Medicine

## 2012-09-04 NOTE — Telephone Encounter (Signed)
Ok for 180. rx printed.

## 2012-09-04 NOTE — Telephone Encounter (Signed)
This ok to fill for 180 quantity

## 2012-09-17 ENCOUNTER — Other Ambulatory Visit: Payer: Self-pay | Admitting: Oncology

## 2012-09-17 DIAGNOSIS — R928 Other abnormal and inconclusive findings on diagnostic imaging of breast: Secondary | ICD-10-CM

## 2012-09-26 ENCOUNTER — Other Ambulatory Visit: Payer: Self-pay | Admitting: Oncology

## 2012-09-26 ENCOUNTER — Ambulatory Visit
Admission: RE | Admit: 2012-09-26 | Discharge: 2012-09-26 | Disposition: A | Discharge status: Home / Self Care | Payer: Medicare Other | Source: Ambulatory Visit | Attending: Oncology | Admitting: Oncology

## 2012-09-26 DIAGNOSIS — R928 Other abnormal and inconclusive findings on diagnostic imaging of breast: Secondary | ICD-10-CM

## 2012-09-28 ENCOUNTER — Telehealth: Payer: Self-pay

## 2012-09-28 ENCOUNTER — Other Ambulatory Visit: Payer: Self-pay | Admitting: Family Medicine

## 2012-09-28 NOTE — Telephone Encounter (Signed)
Ms. Ewton is to see Dr. Darrold Span 10-02-12.  She wanted Dr. Darrold Span to be aware that [facility] consult which is for 10-05-12 with Dr. Ezzard Standing.  She had left mammogram and [place] on 09-26-12.

## 2012-10-01 ENCOUNTER — Other Ambulatory Visit: Payer: Self-pay

## 2012-10-01 ENCOUNTER — Other Ambulatory Visit: Payer: Self-pay | Admitting: Family Medicine

## 2012-10-01 MED ORDER — NITROFURANTOIN MONOHYD MACRO 100 MG PO CAPS: 100.0000 mg | ORAL_CAPSULE | Freq: Every day | ORAL | Status: DC

## 2012-10-01 NOTE — Telephone Encounter (Signed)
Yes, lets just change dose to the 5/325, same quantity, etc.

## 2012-10-01 NOTE — Telephone Encounter (Signed)
Do you want to just change the dose to 3-325mg ?

## 2012-10-02 ENCOUNTER — Telehealth: Payer: Self-pay | Admitting: Oncology

## 2012-10-02 ENCOUNTER — Other Ambulatory Visit (HOSPITAL_BASED_OUTPATIENT_CLINIC_OR_DEPARTMENT_OTHER): Payer: Medicare Other | Admitting: Lab

## 2012-10-02 ENCOUNTER — Ambulatory Visit (HOSPITAL_BASED_OUTPATIENT_CLINIC_OR_DEPARTMENT_OTHER): Payer: Medicare Other | Admitting: Oncology

## 2012-10-02 ENCOUNTER — Encounter: Payer: Self-pay | Admitting: Oncology

## 2012-10-02 VITALS — BP 119/85 | HR 99 | Temp 97.3°F | Resp 18 | Ht 67.0 in | Wt 123.0 lb

## 2012-10-02 DIAGNOSIS — M810 Age-related osteoporosis without current pathological fracture: Secondary | ICD-10-CM

## 2012-10-02 DIAGNOSIS — R52 Pain, unspecified: Secondary | ICD-10-CM

## 2012-10-02 DIAGNOSIS — C50919 Malignant neoplasm of unspecified site of unspecified female breast: Secondary | ICD-10-CM

## 2012-10-02 LAB — CBC WITH DIFFERENTIAL/PLATELET
BASO%: 0.4 % (ref 0.0–2.0)
LYMPH%: 42 % (ref 14.0–49.7)
MCHC: 34.2 g/dL (ref 31.5–36.0)
MCV: 92.7 fL (ref 79.5–101.0)
MONO%: 7.2 % (ref 0.0–14.0)
Platelets: 210 10*3/uL (ref 145–400)
RBC: 4.4 10*6/uL (ref 3.70–5.45)

## 2012-10-02 LAB — COMPREHENSIVE METABOLIC PANEL (CC13)
ALT: 18 U/L (ref 0–55)
Alkaline Phosphatase: 48 U/L (ref 40–150)
Glucose: 80 mg/dl (ref 70–99)
Sodium: 142 mEq/L (ref 136–145)
Total Bilirubin: 0.27 mg/dL (ref 0.20–1.20)
Total Protein: 6.9 g/dL (ref 6.4–8.3)

## 2012-10-02 NOTE — Patient Instructions (Signed)
Keep appointment with Dr Ovidio Kin as scheduled

## 2012-10-02 NOTE — Telephone Encounter (Signed)
gv and printed aug appt...pt ok and aware

## 2012-10-02 NOTE — Progress Notes (Signed)
OFFICE PROGRESS NOTE   10/02/2012   Physicians:C.Metheney, S.MacDiarmid, S.Celesta Gentile (new consultation 10-05-12)   INTERVAL HISTORY:  Patient is seen, alone for visit, in scheduled follow up of breast cancer, continuing Femara for localized recurrence to internal mammary node in August 2007. She had bilateral mammograms and left breast US at [facility] 09-26-12, with referral from [facility] radiologist to Dr Ovidio Kin because of recurrent left nipple irritation. The left nipple symptoms have been intermittent since fall 2011 (per my records), with work up including multiple mammograms and MRI breast around that time, several courses of antibiotics for possible mastitis, and eventual improvement in symptoms with prn Mupirocin ointment 2%. She also had MRIs of spine, as the pain was thought possibly referred to nipple area. The nipple discomfort is only intermittent, described as sharp pain and sensitivity, and some erythema at times, which has been extremely minimal or not apparent on my exams.  History is of T1N0 left breast cancer in 1999, treated with lumpectomy and sentinel node evaluation (Dr Ginette Pitman), local radiation and 5 years of tamoxifen . She was followed on observation until she was found to have involvement of an apparently isolated internal mammary node identified on screening breast MRI in August 2007, core needle biopsy 05-23-2006 with metastatic carcinoma consistent with breast primary, ER + 76%, PR 0, HER 2 negative by FISH. She was treated with initial xeloda and taxotere, then resection of the node in Jan 2008 by Dr Delle Reining path still positive, tho no extracapsular extension identified. She has continued Femara since Feb 2008, despite known low bone density, due to risk of progressive disease. Last bone density scan was at [facility] 02-2012. Last body CTs were 2009 and last PET 2008.     Patient is now on weekly Fosamax, tolerating well. She had URI  recently, resolved. She remains on suppressive nitrofurantoin from Dr Sherron Monday, tho she will discuss that with him at upcoming visit. She has had no new or different pain, no bleeding, no changes otherwise in breasts, no lower respiratory symptoms.  Remainder of 10 point Review of Systems negative.  Patient's sister died 2 weeks ago of renal failure and sepsis with resistant organisms.  Objective:  Vital signs in last 24 hours:  BP 119/85  Pulse 99  Temp 97.3 F (36.3 C) (Oral)  Resp 18  Ht 5\' 7"  (1.702 m)  Wt 123 lb (55.792 kg)  BMI 19.26 kg/m2  Weight is stable. Easily ambulatory, NAD, tearful re sister.  HEENT:PERRLA, sclera clear, anicteric and oropharynx clear, no lesions LymphaticsCervical, supraclavicular, and axillary nodes normal.No inguinal adenopathy Resp: clear to auscultation bilaterally and normal percussion bilaterally Cardio: regular rate and rhythm GI: soft, non-tender; bowel sounds normal; no masses,  no organomegaly Extremities: extremities normal, atraumatic, no cyanosis or edema Neuro:nonfocal Breasts: right without dominant mass, skin or nipple findings, nothing in right axilla. Left breast with inferior surgical scars well healed, no dominant mass, no skin findings, no nipple discharge. I cannot tell any increased erythema at nipple now. Nothing in left axilla, no swelling LUE.  Lab Results:  Results for orders placed in visit on 10/02/12  CBC WITH DIFFERENTIAL      Component Value Range   WBC 3.8 (*) 3.9 - 10.3 10e3/uL   NEUT# 1.9  1.5 - 6.5 10e3/uL   HGB 13.9  11.6 - 15.9 g/dL   HCT 16.1  09.6 - 04.5 %   Platelets 210  145 - 400 10e3/uL   MCV 92.7  79.5 - 101.0 fL   MCH 31.7  25.1 - 34.0 pg   MCHC 34.2  31.5 - 36.0 g/dL   RBC 1.61  0.96 - 0.45 10e6/uL   RDW 13.3  11.2 - 14.5 %   lymph# 1.6  0.9 - 3.3 10e3/uL   MONO# 0.3  0.1 - 0.9 10e3/uL   Eosinophils Absolute 0.1  0.0 - 0.5 10e3/uL   Basophils Absolute 0.0  0.0 - 0.1 10e3/uL   NEUT% 48.9   38.4 - 76.8 %   LYMPH% 42.0  14.0 - 49.7 %   MONO% 7.2  0.0 - 14.0 %   EOS% 1.5  0.0 - 7.0 %   BASO% 0.4  0.0 - 2.0 %  COMPREHENSIVE METABOLIC PANEL (CC13)      Component Value Range   Sodium 142  136 - 145 mEq/L   Potassium 3.7  3.5 - 5.1 mEq/L   Chloride 98  98 - 107 mEq/L   CO2 32 (*) 22 - 29 mEq/L   Glucose 80  70 - 99 mg/dl   BUN 40.9  7.0 - 81.1 mg/dL   Creatinine 0.8  0.6 - 1.1 mg/dL   Total Bilirubin 9.14  0.20 - 1.20 mg/dL   Alkaline Phosphatase 48  40 - 150 U/L   AST 22  5 - 34 U/L   ALT 18  0 - 55 U/L   Total Protein 6.9  6.4 - 8.3 g/dL   Albumin 4.1  3.5 - 5.0 g/dL   Calcium 78.2  8.4 - 95.6 mg/dL     Studies/Results:  No results found.  Medications: I have reviewed the patient's current medications.  Assessment/Plan:  1.Left breast cancer T1N0 1999 and recurrent to one internal mammary node 03-2006: treatment as above, continuing Femara. I will see her back in 6 months or sooner if needed. To see Dr Ovidio Kin in consultation later this week. 2.intermittent pain and possibly erythema of left nipple since fall 2011. She has not had biopsy or ductogram, however also no change in symptoms or clinical exam thru this. Appreciate Dr Allene Pyo thoughts. 3.osteoporosis: continuing aromatase inhibitor even with this due to risk of progressive breast cancer. Will need bone density scan ~ Nov 2014, yearly follow up medically necessary in this situation. 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 Pelvic, perineal pain improved with PT 7.recent death of sister, not cancer related.  Patient was in agreement with plan above.   LIVESAY,LENNIS P, MD   10/02/2012, 1:37 PM

## 2012-10-03 ENCOUNTER — Other Ambulatory Visit: Payer: Self-pay | Admitting: *Deleted

## 2012-10-03 MED ORDER — HYDROCODONE-ACETAMINOPHEN 5-325 MG PO TABS: ORAL_TABLET | ORAL | Status: DC

## 2012-10-04 ENCOUNTER — Ambulatory Visit (INDEPENDENT_AMBULATORY_CARE_PROVIDER_SITE_OTHER): Payer: Medicare Other | Admitting: Surgery

## 2012-10-04 ENCOUNTER — Encounter (INDEPENDENT_AMBULATORY_CARE_PROVIDER_SITE_OTHER): Payer: Self-pay | Admitting: Surgery

## 2012-10-04 VITALS — BP 120/64 | HR 97 | Temp 99.0°F | Resp 18 | Ht 67.5 in | Wt 124.2 lb

## 2012-10-04 DIAGNOSIS — C50919 Malignant neoplasm of unspecified site of unspecified female breast: Secondary | ICD-10-CM

## 2012-10-04 NOTE — Progress Notes (Addendum)
Re:   [name] DOB:   [birth year] MRN:   [id number]  ASSESSMENT AND PLAN: 1.  Left breast cancer -  Initial treated 1999 by Dr. Biagio Borg with lumpectomy and SLNBx.  Required internal mammary node resection by Dr. Delrae Alfred in Jan 2008.  She is followed by Dr. Ila Mcgill  She has been on Femara since then.  2.  Left nipple changes.  But I am not impressed with what I see.  I took photos of the nipple.  [Included in this note.]  I will see her back in 3 months and re-examine her. 3.  Osteroporosis 4.  DJD of back 5.  History of recurrent UTI  Bladder tack by Dr. Stark Bray around 2005.  Now followed by Dr. Kathie Rhodes. MacDiarmid 6.  Right cheek discomfort and numbness.  Blamed on old dental injury.  Sess Dr.Teoh 7.  Trigeminal neuralgia x 10 years.  Has seen Dr. Terrace Arabia.  Tegretol has resolved much of her symptoms.   Chief Complaint  Patient presents with  . New Evaluation    eval L nipple redness/punh bx   REFERRING PHYSICIAN: METHENEY,CATHERINE, MD  HISTORY OF PRESENT ILLNESS: [name] is a 67 y.o. (DOB: 1946-08-05)  white female whose primary care physician is METHENEY,CATHERINE, MD and comes to me today for changes in her left nipple.  Her husband is with her.  The patient has noticed a change in her left nipple going on at almost 2 years. In March 2013, she treated this area was mupirocin. She still has the mupirocin tube. She's not had any nipple discharge. She thinks that she sees some increased redness below the nipple. The areola has not changed.  She otherwise is doing well with her breast cancer. Particularly, with a history of a positive intramammary node that required surgery in 2008.  Breast Cancer History: T1N0 left breast cancer in 1999, treated with lumpectomy and sentinel node evaluation (Dr Ginette Pitman), local radiation and 5 years of tamoxifen . She was followed on observation until she was found to have involvement of an apparently isolated internal mammary  node identified on screening breast MRI in August 2007, core needle biopsy 05-23-2006 with metastatic carcinoma consistent with breast primary, ER + 76%, PR 0, HER 2 negative by FISH. She was treated with initial xeloda and taxotere, then resection of the node in Jan 2008 by Dr Delle Reining path still positive, tho no extracapsular extension identified. She has continued Femara since Feb 2008, despite known low bone density, due to risk of progressive disease. Last bone density scan was at [facility] 02-2012. Last body CTs were 2009 and last PET 2008.  [I got the old chart. Dr. Maryagnes Amos last saw her in Dec 2005 -  Which predates her internal mammary node recurrence.  No further information identified.  DN  10/17/2012.]    Past Medical History  Diagnosis Date  . Hyperlipidemia   . Recurrent sinusitis   . Breast cancer 1999    T1N0 LEFT BREAST  . 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 Osteoporosis 03-04-2011     LAST BONE DENSITY 03-04-2011  . Trigeminal neuralgia   . Degenerative disc disease   . History of bladder infections     RECURRENT      Past Surgical History  Procedure Date  . Abdominal hysterectomy 2005    complete  . Bladder suspension 2005  . Left breast lumpectomy 1999  .  Cancerous ln removed 2008  . Breast surgery       Current Outpatient Prescriptions  Medication Sig Dispense Refill  . alendronate (FOSAMAX) 70 MG tablet Take 1 tablet (70 mg total) by mouth every 7 (seven) days. Take with a full glass of water on an empty stomach.  12 tablet  3  . azelastine (ASTELIN) 137 MCG/SPRAY nasal spray Place 2 sprays into the nose Twice daily.      . Calcium 1250 MG TABS Take by mouth.        . cholecalciferol (VITAMIN D) 1000 UNITS tablet Take 1,000 Units by mouth daily.        . fish oil-omega-3 fatty acids 1000 MG capsule Take 2 g by mouth daily.        . fluticasone (FLONASE) 50 MCG/ACT nasal spray Place 2 sprays into the  nose 2 (two) times daily.      Marland Kitchen HYDROcodone-acetaminophen (NORCO/VICODIN) 5-325 MG per tablet Take one tablet every 12 hours as needed for pain  180 tablet  0  . latanoprost (XALATAN) 0.005 % ophthalmic solution Place 1 drop into both eyes At bedtime.      Marland Kitchen letrozole (FEMARA) 2.5 MG tablet Take 1 tablet (2.5 mg total) by mouth daily.  30 tablet  5  . levocetirizine (XYZAL) 5 MG tablet Take 5 mg by mouth daily.        Marland Kitchen levothyroxine (SYNTHROID, LEVOTHROID) 50 MCG tablet TAKE ONE TABLET BY MOUTH EVERY DAY  90 tablet  0  . meloxicam (MOBIC) 7.5 MG tablet take 1 tablet by mouth once daily  30 tablet  3  . montelukast (SINGULAIR) 10 MG tablet Take 1 tablet (10 mg total) by mouth at bedtime.  180 tablet  1  . nitrofurantoin, macrocrystal-monohydrate, (MACROBID) 100 MG capsule Take 1 capsule (100 mg total) by mouth daily.  90 capsule  1  . omeprazole (PRILOSEC) 20 MG capsule Take 1 capsule (20 mg total) by mouth 2 (two) times daily.  360 capsule  1  . pravastatin (PRAVACHOL) 40 MG tablet Take 40 mg by mouth daily.      . Red Yeast Rice 600 MG CAPS Take by mouth.        . traMADol (ULTRAM) 50 MG tablet TAKE ONE TABLET BY MOUTH TWICE DAILY AS NEEDED FOR PAIN  360 tablet  0  . triamterene-hydrochlorothiazide (MAXZIDE-25) 37.5-25 MG per tablet TAKE ONE TABLET BY MOUTH EVERY DAY  90 tablet  0  . cyclobenzaprine (FLEXERIL) 10 MG tablet TAKE ONE TABLET BY MOUTH EVERY DAY AT BEDTIME AS NEEDED FOR MUSCLE SPASM  90 tablet  0  . LORazepam (ATIVAN) 1 MG tablet TAKE ONE TABLET BY MOUTH EVERY DAY AS NEEDED  180 tablet  0  . Olopatadine HCl (PATADAY) 0.2 % SOLN Apply 1 drop to eye daily as needed.       . pravastatin (PRAVACHOL) 40 MG tablet Take 1 tablet (40 mg total) by mouth at bedtime.  90 tablet  1      Allergies  Allergen Reactions  . Cefadroxil     REACTION: rash  . Doxycycline Itching  . Simvastatin Other (See Comments)    Aches and memory loss  . Dicloxacillin Rash    REVIEW OF SYSTEMS: Skin:   No history of rash.  No history of abnormal moles. Infection:  No history of hepatitis or HIV.  No history of MRSA. Neurologic:  Trigeminal neuralgia of left face.  Has seen Dr.Yan.  Symptoms controlled with Tegretol  Cardiac:  No history of hypertension. No history of heart disease.  No history of prior cardiac catheterization.   Pulmonary:  Does not smoke cigarettes.  No asthma or bronchitis.  No OSA/CPAP.  History of allergies.  Has seen Dr. Irineo Axon.  Endocrine:  No diabetes. No thyroid disease. Gastrointestinal:  No history of stomach disease.  No history of liver disease.  No history of gall bladder disease.  No history of pancreas disease.  No history of colon disease. Urologic: History of recurrent UTI.  Bladder tack by Dr. Stark Bray around 2005.  Now followed by Dr. Kathie Rhodes. MacDiarmid Musculoskeletal:  No history of joint or back disease. Hematologic:  No bleeding disorder.  No history of anemia.  Not anticoagulated. Psycho-social:  The patient is oriented.   The patient has no obvious psychologic or social impairment to understanding our conversation and plan.  SOCIAL and FAMILY HISTORY: Sister died in 09/25/12- renal failure and sepsis. Husband with patient.  PHYSICAL EXAM: Pulse 97  Temp 99 F (37.2 C) (Temporal)  Resp 18  Ht 5' 7.5" (1.715 m)  Wt 124 lb 3.2 oz (56.337 kg)  BMI 19.17 kg/m2  General: Thin WF who is alert and generally healthy appearing.  HEENT: Normal. Pupils equal. Neck: Supple. No mass.  No thyroid mass. Lymph Nodes:  No supraclavicular or cervical nodes. Breasts:  Right - Normal  Left - Slightly redder right nipple with slightly paler areola.  Scar at 6 o'clock with loss of breast tissue in the inferior aspect of the breast. Lungs: Clear to auscultation and symmetric breath sounds. Heart:  RRR. No murmur or rub.  Extremities:  Good strength and ROM  in upper and lower extremities. Psychiatric: Has normal mood and affect. Behavior is normal.     Above:   Left breast.  Note scar at infra-mammary fold from prior lumpectomy    Above:  Both breast, to compare right and left.  DATA REVIEWED: Mammogram and MRI reports.  Ovidio Kin, MD,  [facility] [address] [place]  [address]   [place], [place]    [address] Phone:  [phone] FAX:  [phone]

## 2012-10-05 ENCOUNTER — Ambulatory Visit (INDEPENDENT_AMBULATORY_CARE_PROVIDER_SITE_OTHER): Payer: Self-pay | Admitting: Surgery

## 2012-10-25 ENCOUNTER — Other Ambulatory Visit: Payer: Self-pay | Admitting: Family Medicine

## 2012-10-30 ENCOUNTER — Telehealth: Payer: Self-pay | Admitting: Family Medicine

## 2012-10-30 DIAGNOSIS — N390 Urinary tract infection, site not specified: Secondary | ICD-10-CM

## 2012-10-30 NOTE — Telephone Encounter (Signed)
I would like to get her back on with URology to discuss tx optiosn for her recurrent UTI beside her nitrofurantoin.  Prolonged use can cause long term problems. I think it is time to see URology to recommend another drug at this point  Let me now if has a preference for where wouldlike to go.

## 2012-10-30 NOTE — Telephone Encounter (Signed)
Called pt & she states that she is going to make an appt with Lorin Picket Macdirmid of [facility] in [place]. She also was asking if it is time for her to come in to see you for some thyroid labs & a check up. Please advise

## 2012-10-31 NOTE — Telephone Encounter (Signed)
Was unable to reach patient.

## 2012-10-31 NOTE — Telephone Encounter (Signed)
Yes, has been 6 months so have her make appt sometime this month.

## 2012-11-01 NOTE — Telephone Encounter (Signed)
Pt notified and sent to schedule appointment

## 2012-11-08 ENCOUNTER — Ambulatory Visit (INDEPENDENT_AMBULATORY_CARE_PROVIDER_SITE_OTHER): Payer: Medicare Other | Admitting: Family Medicine

## 2012-11-08 ENCOUNTER — Encounter: Payer: Self-pay | Admitting: Family Medicine

## 2012-11-08 VITALS — BP 126/85 | HR 102 | Ht 67.5 in | Wt 126.0 lb

## 2012-11-08 DIAGNOSIS — N390 Urinary tract infection, site not specified: Secondary | ICD-10-CM

## 2012-11-08 DIAGNOSIS — E785 Hyperlipidemia, unspecified: Secondary | ICD-10-CM

## 2012-11-08 DIAGNOSIS — R5381 Other malaise: Secondary | ICD-10-CM

## 2012-11-08 LAB — POCT URINALYSIS DIPSTICK
Bilirubin, UA: NEGATIVE
Protein, UA: 30
Spec Grav, UA: 1.015

## 2012-11-08 MED ORDER — FLUCONAZOLE 150 MG PO TABS: 150.0000 mg | ORAL_TABLET | Freq: Once | ORAL | Status: DC

## 2012-11-08 MED ORDER — CEFTRIAXONE SODIUM 1 G IJ SOLR
1.0000 g | Freq: Once | INTRAMUSCULAR | Status: AC
  Administered 2012-11-08: 1 g via INTRAMUSCULAR

## 2012-11-08 MED ORDER — CEFTRIAXONE SODIUM 500 MG IJ SOLR: 500.0000 mg | Freq: Once | INTRAMUSCULAR | Status: DC

## 2012-11-08 MED ORDER — CEFDINIR 300 MG PO CAPS: 300.0000 mg | ORAL_CAPSULE | Freq: Two times a day (BID) | ORAL | Status: AC

## 2012-11-08 NOTE — Progress Notes (Signed)
Subjective:    Patient ID: [name], female    DOB: [birth year], 67 y.o.   MRN: [id number]  HPI UTI-x 2-3 days taking pyridium right sided flank pain. Not sure if any hematuria. Any fever. Some right flank pain iwht it and some pelvic discomfort.  Has had some muslce pain  In the right groin. No recent ABX.  She is still taking her macrobid. She did get my message about the potential harms of long-term use of Macrobid. She has made an appt with her urologist for next Friday to discuss this.  Her past medical history is significant for recurrent UTIs and a urological procedure. patient had a bladder tacking several years ago and has had recurrent blader issues.     Review of Systems BP 126/85  Pulse 102  Ht 5' 7.5" (1.715 m)  Wt 126 lb (57.153 kg)  BMI 19.43 kg/m2    Allergies  Allergen Reactions  . Cefadroxil     REACTION: rash  . Doxycycline Itching  . Simvastatin Other (See Comments)    Aches and memory loss  . Dicloxacillin Rash    Past Medical History  Diagnosis Date  . Hyperlipidemia   . Recurrent sinusitis   . Breast cancer 1999    T1N0 LEFT BREAST  . 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 Osteoporosis 03-04-2011     LAST BONE DENSITY 03-04-2011  . Trigeminal neuralgia   . Degenerative disc disease   . History of bladder infections     RECURRENT  . Thyroid disease     Past Surgical History  Procedure Laterality Date  . Bladder suspension  2005  . Left breast lumpectomy  1999  . Cancerous ln removed  2008  . Breast surgery  1999    Lumpectomy L Br  . Breast surgery  2007    Lymph Node Removal L Chest Wall  . Abdominal hysterectomy  2005    Complete    History   Social History  . Marital Status: Married    Spouse Name: N/A    Number of Children: N/A  . Years of Education: N/A   Occupational History  . Not on file.   Social History Main Topics  . Smoking status: Never Smoker   . Smokeless tobacco:  Never Used  . Alcohol Use: No  . Drug Use: No  . Sexually Active: Not on file     Comment: housewife, married, 3 kids.   Other Topics Concern  . Not on file   Social History Narrative  . No narrative on file    Family History  Problem Relation Age of Onset  . Cancer Mother     breast  . Stroke Mother   . Hyperlipidemia Mother   . Cancer Father     bladder  . Cancer Son     Breast  . Cancer Maternal Aunt     Breast  . Stomach cancer Paternal Uncle   . Cancer Paternal Grandmother     Mouth  . Stomach cancer Paternal Uncle     Outpatient Encounter Prescriptions as of 11/08/2012  Medication Sig Dispense Refill  . alendronate (FOSAMAX) 70 MG tablet Take 1 tablet (70 mg total) by mouth every 7 (seven) days. Take with a full glass of water on an empty stomach.  12 tablet  3  . azelastine (ASTELIN) 137 MCG/SPRAY nasal spray Place 2 sprays into the nose Twice daily.      Marland Kitchen  Calcium 1250 MG TABS Take by mouth.        . cefdinir (OMNICEF) 300 MG capsule Take 1 capsule (300 mg total) by mouth 2 (two) times daily.  10 capsule  0  . cholecalciferol (VITAMIN D) 1000 UNITS tablet Take 1,000 Units by mouth daily.        . cyclobenzaprine (FLEXERIL) 10 MG tablet TAKE ONE TABLET BY MOUTH EVERY DAY AT BEDTIME AS NEEDED FOR MUSCLE SPASM  90 tablet  0  . fish oil-omega-3 fatty acids 1000 MG capsule Take 2 g by mouth daily.        . fluconazole (DIFLUCAN) 150 MG tablet Take 1 tablet (150 mg total) by mouth once.  1 tablet  0  . fluticasone (FLONASE) 50 MCG/ACT nasal spray Place 2 sprays into the nose 2 (two) times daily.      Marland Kitchen HYDROcodone-acetaminophen (NORCO/VICODIN) 5-325 MG per tablet Take one tablet every 12 hours as needed for pain  180 tablet  0  . latanoprost (XALATAN) 0.005 % ophthalmic solution Place 1 drop into both eyes At bedtime.      Marland Kitchen letrozole (FEMARA) 2.5 MG tablet Take 1 tablet (2.5 mg total) by mouth daily.  30 tablet  5  . levocetirizine (XYZAL) 5 MG tablet Take 5 mg by mouth  daily.        Marland Kitchen levothyroxine (SYNTHROID, LEVOTHROID) 50 MCG tablet TAKE ONE TABLET BY MOUTH EVERY DAY  90 tablet  0  . LORazepam (ATIVAN) 1 MG tablet TAKE ONE TABLET BY MOUTH EVERY DAY AS NEEDED  180 tablet  0  . meloxicam (MOBIC) 7.5 MG tablet take 1 tablet by mouth once daily  30 tablet  3  . montelukast (SINGULAIR) 10 MG tablet Take 1 tablet (10 mg total) by mouth at bedtime.  180 tablet  1  . nitrofurantoin, macrocrystal-monohydrate, (MACROBID) 100 MG capsule Take 1 capsule (100 mg total) by mouth daily.  90 capsule  1  . Olopatadine HCl (PATADAY) 0.2 % SOLN Apply 1 drop to eye daily as needed.       Marland Kitchen omeprazole (PRILOSEC) 20 MG capsule Take 1 capsule (20 mg total) by mouth 2 (two) times daily.  360 capsule  1  . pravastatin (PRAVACHOL) 40 MG tablet Take 1 tablet (40 mg total) by mouth at bedtime.  90 tablet  1  . pravastatin (PRAVACHOL) 40 MG tablet Take 40 mg by mouth daily.      . Red Yeast Rice 600 MG CAPS Take by mouth.        . traMADol (ULTRAM) 50 MG tablet TAKE ONE TABLET BY MOUTH TWICE DAILY AS NEEDED FOR PAIN  360 tablet  0  . triamterene-hydrochlorothiazide (MAXZIDE-25) 37.5-25 MG per tablet TAKE ONE TABLET BY MOUTH EVERY DAY  90 tablet  0   No facility-administered encounter medications on file as of 11/08/2012.          Objective:   Physical Exam  Constitutional: She is oriented to person, place, and time. She appears well-developed and well-nourished.  HENT:  Head: Normocephalic and atraumatic.  Cardiovascular: Normal rate, regular rhythm and normal heart sounds.   Pulmonary/Chest: Effort normal and breath sounds normal.  Abdominal: Soft. Bowel sounds are normal. She exhibits no distension and no mass. There is no tenderness. There is no rebound and no guarding.  Musculoskeletal:  No CVA tenderness.  Neurological: She is alert and oriented to person, place, and time.  Skin: Skin is warm and dry.  Psychiatric: She has a  normal mood and affect. Her behavior is  normal.          Assessment & Plan:  UTI - Last UTI was in July.  Was tx with omnicelf and that worked well in July. Continue prn pyridium.  Will send culture.  Explained to her that typically we don't give Rocephin injection unless she is febrile or we suspect pyelonephritis. But because of her complicated history and she really doesn't feel well today even though she is afebrile we will go ahead and do Rocephin 1 g IM today only per her request. We should have her culture results back by Monday can call her and let her know when he did change the antibiotic around. Continue to make sure drinking plenty of fluids.    She also originally had an appointment scheduled for tomorrow to followup on more for chronic issues. She was due for blood work. I want her to give her her lab slip today and encouraged her to make an appointment a couple weeks from now so we can followup on her chronic issues as well as rechecking her urine to make sure that she is cleared the infection.

## 2012-11-08 NOTE — Addendum Note (Signed)
Addended by: Deno Etienne on: 11/08/2012 01:29 PM   Modules accepted: Orders

## 2012-11-09 ENCOUNTER — Ambulatory Visit: Payer: Medicare Other | Admitting: Family Medicine

## 2012-11-09 LAB — COMPLETE METABOLIC PANEL WITH GFR
Albumin: 4.5 g/dL (ref 3.5–5.2)
CO2: 30 mEq/L (ref 19–32)
Calcium: 9.5 mg/dL (ref 8.4–10.5)
Chloride: 96 mEq/L (ref 96–112)
GFR, Est African American: >89 mL/min
GFR, Est Non African American: >89 mL/min
Glucose, Bld: 81 mg/dL (ref 70–99)
Sodium: 134 mEq/L — ABNORMAL LOW (ref 135–145)
Total Bilirubin: 0.3 mg/dL (ref 0.3–1.2)
Total Protein: 6.5 g/dL (ref 6.0–8.3)

## 2012-11-09 LAB — LIPID PANEL
LDL Cholesterol: 126 mg/dL — ABNORMAL HIGH (ref 0–99)
Triglycerides: 115 mg/dL (ref <150)
VLDL: 23 mg/dL (ref 0–40)

## 2012-11-11 LAB — URINE CULTURE

## 2012-11-19 ENCOUNTER — Other Ambulatory Visit: Payer: Self-pay | Admitting: Family Medicine

## 2012-11-22 ENCOUNTER — Ambulatory Visit (INDEPENDENT_AMBULATORY_CARE_PROVIDER_SITE_OTHER): Payer: Medicare Other | Admitting: Family Medicine

## 2012-11-22 ENCOUNTER — Encounter: Payer: Self-pay | Admitting: Family Medicine

## 2012-11-22 VITALS — BP 110/71 | HR 103 | Wt 124.0 lb

## 2012-11-22 DIAGNOSIS — N390 Urinary tract infection, site not specified: Secondary | ICD-10-CM

## 2012-11-22 NOTE — Progress Notes (Signed)
  Subjective:    Patient ID: [name], female    DOB: [birth year], 67 y.o.   MRN: [id number]  HPI I had urged her to see her urologist to discuss her long-term use of Macrodantin and whether not he felt that that was safe to continue at this time or not. The medication has been very helpful for her. pt saw her urologist last week and her urine was ok she has questions about taking the Macrobid.  Her urologist per his note spent a lot of time discussing the pros and cons of the medication. It doesn help contol her UTIs. Her urinary sxs are much better. She recently had a urinary tract infection which we treated. She says she feels completely better. Her urine culture was not significant enough to be considered a positive culture but did grow out Escherichia coli.  She gave me some other updates on her health. Doing PT for pain in her right hip and groin.     Review of Systems     Objective:   Physical Exam  Constitutional: She is oriented to person, place, and time. She appears well-developed and well-nourished.  HENT:  Head: Normocephalic and atraumatic.  Cardiovascular: Normal rate, regular rhythm and normal heart sounds.   Pulmonary/Chest: Effort normal and breath sounds normal.  Neurological: She is alert and oriented to person, place, and time.  Skin: Skin is warm and dry.  Psychiatric: She has a normal mood and affect. Her behavior is normal.          Assessment & Plan:  Frequent UTI- we again reviewed the risks of long-term use of migraine including pulmonary fibrosis and potential renal problems. I think ultimately the decision is up to her ears. She said very wellness medication and has been very helpful for her over the last 5 years. I asked her the urologist had recommended any alternatives and she said he did not mention that. She has been thinking about switching to a urologist more locally just for convenience sake. She really likes Dr. Jacquelyne Balint but feels like something  closer would be helpful. Certainly if she decides to see the urologist locally then they could discuss this further.   Is otherwise doing well then recommend followup in 6 months.  Time spent 15 minutes, greater than 50% time counseling about frequent UTIs and long-term medication risks.

## 2012-12-04 ENCOUNTER — Ambulatory Visit: Payer: Medicare Other | Attending: Urology | Admitting: Physical Therapy

## 2012-12-04 DIAGNOSIS — IMO0001 Reserved for inherently not codable concepts without codable children: Secondary | ICD-10-CM | POA: Insufficient documentation

## 2012-12-04 DIAGNOSIS — M629 Disorder of muscle, unspecified: Secondary | ICD-10-CM | POA: Insufficient documentation

## 2012-12-04 DIAGNOSIS — M2420 Disorder of ligament, unspecified site: Secondary | ICD-10-CM | POA: Insufficient documentation

## 2012-12-06 ENCOUNTER — Ambulatory Visit: Payer: Medicare Other | Admitting: Physical Therapy

## 2012-12-11 ENCOUNTER — Ambulatory Visit: Payer: Medicare Other | Admitting: Physical Therapy

## 2012-12-17 ENCOUNTER — Ambulatory Visit: Payer: Medicare Other | Admitting: Physical Therapy

## 2012-12-19 ENCOUNTER — Ambulatory Visit: Payer: Medicare Other | Admitting: Physical Therapy

## 2012-12-24 ENCOUNTER — Ambulatory Visit: Payer: Medicare Other | Admitting: Physical Therapy

## 2012-12-25 ENCOUNTER — Other Ambulatory Visit: Payer: Self-pay | Admitting: Oncology

## 2012-12-25 ENCOUNTER — Other Ambulatory Visit: Payer: Self-pay | Admitting: Family Medicine

## 2012-12-25 DIAGNOSIS — C50912 Malignant neoplasm of unspecified site of left female breast: Secondary | ICD-10-CM

## 2012-12-26 ENCOUNTER — Ambulatory Visit: Payer: Medicare Other | Admitting: Physical Therapy

## 2012-12-27 ENCOUNTER — Other Ambulatory Visit: Payer: Self-pay | Admitting: *Deleted

## 2012-12-27 MED ORDER — HYDROCODONE-ACETAMINOPHEN 5-325 MG PO TABS: ORAL_TABLET | ORAL | Status: DC

## 2012-12-31 ENCOUNTER — Ambulatory Visit: Payer: Medicare Other | Attending: Urology | Admitting: Physical Therapy

## 2012-12-31 DIAGNOSIS — M629 Disorder of muscle, unspecified: Secondary | ICD-10-CM | POA: Insufficient documentation

## 2012-12-31 DIAGNOSIS — M2420 Disorder of ligament, unspecified site: Secondary | ICD-10-CM | POA: Insufficient documentation

## 2012-12-31 DIAGNOSIS — IMO0001 Reserved for inherently not codable concepts without codable children: Secondary | ICD-10-CM | POA: Insufficient documentation

## 2013-01-02 ENCOUNTER — Ambulatory Visit: Payer: Medicare Other | Admitting: Physical Therapy

## 2013-01-07 ENCOUNTER — Ambulatory Visit: Payer: Medicare Other | Admitting: Physical Therapy

## 2013-01-09 ENCOUNTER — Ambulatory Visit: Payer: Medicare Other | Admitting: Physical Therapy

## 2013-01-14 ENCOUNTER — Ambulatory Visit: Payer: Medicare Other | Admitting: Physical Therapy

## 2013-01-16 ENCOUNTER — Encounter: Payer: Medicare Other | Admitting: Physical Therapy

## 2013-01-22 ENCOUNTER — Ambulatory Visit: Payer: Medicare Other | Admitting: Physical Therapy

## 2013-01-24 ENCOUNTER — Ambulatory Visit: Payer: Medicare Other | Admitting: Physical Therapy

## 2013-01-28 ENCOUNTER — Ambulatory Visit: Payer: Medicare Other | Attending: Urology | Admitting: Physical Therapy

## 2013-01-28 DIAGNOSIS — M25569 Pain in unspecified knee: Secondary | ICD-10-CM | POA: Insufficient documentation

## 2013-01-28 DIAGNOSIS — M545 Low back pain, unspecified: Secondary | ICD-10-CM | POA: Insufficient documentation

## 2013-01-28 DIAGNOSIS — R5381 Other malaise: Secondary | ICD-10-CM | POA: Insufficient documentation

## 2013-01-28 DIAGNOSIS — IMO0001 Reserved for inherently not codable concepts without codable children: Secondary | ICD-10-CM | POA: Insufficient documentation

## 2013-01-29 ENCOUNTER — Other Ambulatory Visit: Payer: Self-pay | Admitting: Family Medicine

## 2013-01-30 ENCOUNTER — Other Ambulatory Visit: Payer: Self-pay | Admitting: *Deleted

## 2013-01-30 MED ORDER — TRIAMTERENE-HCTZ 37.5-25 MG PO TABS: ORAL_TABLET | ORAL | Status: DC

## 2013-01-30 MED ORDER — LEVOTHYROXINE SODIUM 50 MCG PO TABS: ORAL_TABLET | ORAL | Status: DC

## 2013-02-07 ENCOUNTER — Encounter: Payer: Self-pay | Admitting: Family Medicine

## 2013-02-07 ENCOUNTER — Ambulatory Visit (INDEPENDENT_AMBULATORY_CARE_PROVIDER_SITE_OTHER): Payer: Medicare Other | Admitting: Family Medicine

## 2013-02-07 VITALS — BP 135/76 | HR 99 | Wt 124.0 lb

## 2013-02-07 DIAGNOSIS — R102 Pelvic and perineal pain: Secondary | ICD-10-CM

## 2013-02-07 DIAGNOSIS — M549 Dorsalgia, unspecified: Secondary | ICD-10-CM

## 2013-02-07 DIAGNOSIS — N949 Unspecified condition associated with female genital organs and menstrual cycle: Secondary | ICD-10-CM

## 2013-02-07 LAB — POCT URINALYSIS DIPSTICK
Nitrite, UA: NEGATIVE
Protein, UA: NEGATIVE
pH, UA: 7.5

## 2013-02-07 NOTE — Progress Notes (Signed)
Subjective:    Patient ID: [name], female    DOB: 06/23/1946, 67 y.o.   MRN: [id number]  HPI Says her pelvic pain has been better since doing PT. She says still has pain but just completed PT 2 week ago. Her therapist, Mardene Speak, said some of this could be from her back. Did see Dr. Ethelene Hal and he didn't recommend MRI at this time.  Says has been using heating pad and got a massage as well.  Helped the catch she had in her back. She feels this has been helpful.  Pain is worse on the right low back and towards her buttock.  She says Dr. Ethelene Hal gave her a rx for PT as well. She says sometimes even just crossing her legs she will have a sharp vaginal pain.  Had a pelvic sling 9 yr ago.  After had that done couldn't have sex bc of pain. Her pelvic problems have been an ongoing issue since then. Dr. McDiarmid did her well woman exam and told her everything was fine about the sling.    Review of Systems  BP 135/76  Pulse 99  Wt 124 lb (56.246 kg)  BMI 19.12 kg/m2    Allergies  Allergen Reactions  . Cefadroxil     REACTION: rash  . Doxycycline Itching  . Simvastatin Other (See Comments)    Aches and memory loss  . Dicloxacillin Rash    Past Medical History  Diagnosis Date  . Hyperlipidemia   . Recurrent sinusitis   . Breast cancer 1999    T1N0 LEFT BREAST  . 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 Osteoporosis 03-04-2011     LAST BONE DENSITY 03-04-2011  . Trigeminal neuralgia   . Degenerative disc disease   . History of bladder infections     RECURRENT  . Thyroid disease     Past Surgical History  Procedure Laterality Date  . Bladder suspension  2005  . Left breast lumpectomy  1999  . Cancerous ln removed  2008  . Breast surgery  1999    Lumpectomy L Br  . Breast surgery  2007    Lymph Node Removal L Chest Wall  . Abdominal hysterectomy  2005    Complete    History   Social History  . Marital Status: Married   Spouse Name: N/A    Number of Children: N/A  . Years of Education: N/A   Occupational History  . Not on file.   Social History Main Topics  . Smoking status: Never Smoker   . Smokeless tobacco: Never Used  . Alcohol Use: No  . Drug Use: No  . Sexually Active: Not on file     Comment: housewife, married, 3 kids.   Other Topics Concern  . Not on file   Social History Narrative  . No narrative on file    Family History  Problem Relation Age of Onset  . Cancer Mother     breast  . Stroke Mother   . Hyperlipidemia Mother   . Cancer Father     bladder  . Cancer Son     Breast  . Cancer Maternal Aunt     Breast  . Stomach cancer Paternal Uncle   . Cancer Paternal Grandmother     Mouth  . Stomach cancer Paternal Uncle     Outpatient Encounter Prescriptions as of 02/07/2013  Medication Sig Dispense Refill  . alendronate (FOSAMAX)  70 MG tablet Take 1 tablet (70 mg total) by mouth every 7 (seven) days. Take with a full glass of water on an empty stomach.  12 tablet  3  . azelastine (ASTELIN) 137 MCG/SPRAY nasal spray Place 2 sprays into the nose Twice daily.      . Calcium 1250 MG TABS Take by mouth.        . cholecalciferol (VITAMIN D) 1000 UNITS tablet Take 1,000 Units by mouth daily.        . cyclobenzaprine (FLEXERIL) 10 MG tablet TAKE ONE TABLET BY MOUTH EVERY DAY AT BEDTIME AS NEEDED FOR MUSCLE SPASM  90 tablet  0  . fish oil-omega-3 fatty acids 1000 MG capsule Take 2 g by mouth daily.        . fluticasone (FLONASE) 50 MCG/ACT nasal spray Place 2 sprays into the nose 2 (two) times daily.      Marland Kitchen HYDROcodone-acetaminophen (NORCO/VICODIN) 5-325 MG per tablet Take one tablet every 12 hours as needed for pain  180 tablet  0  . latanoprost (XALATAN) 0.005 % ophthalmic solution Place 1 drop into both eyes At bedtime.      Marland Kitchen letrozole (FEMARA) 2.5 MG tablet TAKE 1 TABLET (2.5 MG TOTAL) BY MOUTH DAILY.  90 tablet  1  . levocetirizine (XYZAL) 5 MG tablet Take 5 mg by mouth daily.         Marland Kitchen levothyroxine (SYNTHROID, LEVOTHROID) 50 MCG tablet TAKE ONE TABLET BY MOUTH ONCE DAILY  90 tablet  1  . LORazepam (ATIVAN) 1 MG tablet TAKE ONE TABLET BY MOUTH EVERY DAY AS NEEDED  180 tablet  0  . meloxicam (MOBIC) 7.5 MG tablet take 1 tablet by mouth once daily  30 tablet  3  . montelukast (SINGULAIR) 10 MG tablet Take 1 tablet (10 mg total) by mouth at bedtime.  180 tablet  1  . nitrofurantoin, macrocrystal-monohydrate, (MACROBID) 100 MG capsule Take 1 capsule (100 mg total) by mouth daily.  90 capsule  1  . Olopatadine HCl (PATADAY) 0.2 % SOLN Apply 1 drop to eye daily as needed.       Marland Kitchen omeprazole (PRILOSEC) 20 MG capsule Take 1 capsule (20 mg total) by mouth 2 (two) times daily.  360 capsule  1  . pravastatin (PRAVACHOL) 40 MG tablet TAKE ONE TABLET BY MOUTH EVERY DAY  90 tablet  0  . Red Yeast Rice 600 MG CAPS Take by mouth.        . traMADol (ULTRAM) 50 MG tablet TAKE ONE TABLET BY MOUTH TWICE DAILY AS NEEDED FOR PAIN  360 tablet  0  . triamterene-hydrochlorothiazide (MAXZIDE-25) 37.5-25 MG per tablet TAKE ONE TABLET BY MOUTH ONCE DAILY.  90 tablet  1  . pravastatin (PRAVACHOL) 40 MG tablet Take 1 tablet (40 mg total) by mouth at bedtime.  90 tablet  1   No facility-administered encounter medications on file as of 02/07/2013.          Objective:   Physical Exam  Constitutional: She appears well-developed and well-nourished.  HENT:  Head: Normocephalic and atraumatic.  Abdominal: Soft.  Musculoskeletal:  Normal lumbar flexion and extension. She does have some tenderness over the lower lumbar spine as well as the right paraspinous muscles and over the right buttock. Negative straight leg raise light bilaterally. Hip, knee, ankle strength is 5 out of 5 bilaterally. Patellar reflexes are 2+ bilaterally.  Skin: Skin is warm and dry.  Psychiatric: She has a normal mood and affect. Her behavior  is normal.          Assessment & Plan:  BAck Pain - Recommend PT. I think  physical therapy is a fantastic place to start. I think he can make a big difference in low back pain without moving to surgical options at this point in time. She does have a followup with Dr. Modesta Messing, her orthopedist in about 3 weeks to see how she's doing with physical therapy. I think it is prudent to hold off on the MRI until she at least does a course of physical therapy.  Plevic Pain - I'm glad she has gotten some improvement with physical therapy for her pelvic pain is fantastic. It is possible said some of pelvic pain could actually be radiation of pain from her back. She may also have some vaginal dryness as this can sometimes cause sharp pains especially with movement and activity in the vaginal area so she may want to try using a moisturizer consistently. At this point in time I think it's time to refer her to a specialist. There is a physician I believe in the OB/GYN Department at [facility] he specializes in pelvic pain so we'll try to get her in with her to see if she at all is concerned or thinks that the pelvic sling could be related to her pelvic discomfort.  Time 25 min, > 50% of time spent counseling about Back pain and pelvic pain.

## 2013-02-08 ENCOUNTER — Ambulatory Visit: Payer: Medicare Other | Attending: Physical Medicine and Rehabilitation | Admitting: Physical Therapy

## 2013-02-08 DIAGNOSIS — M545 Low back pain, unspecified: Secondary | ICD-10-CM | POA: Insufficient documentation

## 2013-02-08 DIAGNOSIS — IMO0001 Reserved for inherently not codable concepts without codable children: Secondary | ICD-10-CM | POA: Insufficient documentation

## 2013-02-08 DIAGNOSIS — R5381 Other malaise: Secondary | ICD-10-CM | POA: Insufficient documentation

## 2013-02-08 DIAGNOSIS — M25569 Pain in unspecified knee: Secondary | ICD-10-CM | POA: Insufficient documentation

## 2013-02-11 ENCOUNTER — Encounter: Payer: Medicare Other | Admitting: Physical Therapy

## 2013-02-11 ENCOUNTER — Other Ambulatory Visit: Payer: Self-pay | Admitting: Family Medicine

## 2013-02-12 ENCOUNTER — Other Ambulatory Visit: Payer: Self-pay | Admitting: *Deleted

## 2013-02-12 MED ORDER — PRAVASTATIN SODIUM 40 MG PO TABS: ORAL_TABLET | ORAL | Status: DC

## 2013-02-13 ENCOUNTER — Ambulatory Visit (INDEPENDENT_AMBULATORY_CARE_PROVIDER_SITE_OTHER): Payer: Medicare Other | Admitting: Physician Assistant

## 2013-02-13 ENCOUNTER — Telehealth: Payer: Self-pay | Admitting: *Deleted

## 2013-02-13 DIAGNOSIS — N390 Urinary tract infection, site not specified: Secondary | ICD-10-CM

## 2013-02-13 MED ORDER — CEFTRIAXONE SODIUM 1 G IJ SOLR
1.0000 g | Freq: Once | INTRAMUSCULAR | Status: AC
  Administered 2013-02-13: 1 g via INTRAMUSCULAR

## 2013-02-13 MED ORDER — FOSFOMYCIN TROMETHAMINE 3 G PO PACK: 3.0000 g | PACK | Freq: Once | ORAL | Status: DC

## 2013-02-13 MED ORDER — LIDOCAINE HCL (PF) 1 % IJ SOLN
2.0000 mL | Freq: Once | INTRAMUSCULAR | Status: AC
  Administered 2013-02-13: 2 mL

## 2013-02-13 MED ORDER — FLUCONAZOLE 150 MG PO TABS: ORAL_TABLET | ORAL | Status: DC

## 2013-02-13 NOTE — Progress Notes (Signed)
  Subjective:    Patient ID: [name], female    DOB: 03-Dec-1945, 67 y.o.   MRN: [id number] Rocephin 1gram given IM left ventrogluteal. Pt tolerated well without complications. Barry Dienes, LPN  HPI    Review of Systems     Objective:   Physical Exam        Assessment & Plan:  Patient request Diflucan be sent to her pharmacy [facility] [address] [facility] in [place]  Pt called office about 1 hour after injection stating she was itching from her head to her toes. Denied any SOB or throat closing up. She took 2 benadryl and feeling better. Added rocephin to allergy. Sent fosphomycin to pharmacy to take since sensitivities were very limited and would cause her to have to go to ER. Call office if not resolving. Tandy Gaw PA-c

## 2013-02-13 NOTE — Telephone Encounter (Signed)
Patient calls and states 30 minutes ago started itching in head, neck ears and nose. No rash visible, no SOB. Took 2 Benedryl which is helping some but is supposed to get 2 more injections

## 2013-02-13 NOTE — Telephone Encounter (Signed)
Sent fosphomycin to pharmacy.

## 2013-02-14 ENCOUNTER — Encounter: Payer: Medicare Other | Admitting: Physical Therapy

## 2013-02-14 ENCOUNTER — Ambulatory Visit: Payer: Medicare Other

## 2013-02-18 ENCOUNTER — Ambulatory Visit: Payer: Medicare Other | Admitting: Physical Therapy

## 2013-02-20 ENCOUNTER — Ambulatory Visit: Payer: Medicare Other | Admitting: Physical Therapy

## 2013-02-25 ENCOUNTER — Ambulatory Visit: Payer: Medicare Other | Admitting: Physical Therapy

## 2013-02-26 ENCOUNTER — Other Ambulatory Visit: Payer: Self-pay | Admitting: Family Medicine

## 2013-02-27 ENCOUNTER — Ambulatory Visit: Payer: Medicare Other | Attending: Physical Medicine and Rehabilitation | Admitting: Physical Therapy

## 2013-02-27 DIAGNOSIS — M545 Low back pain, unspecified: Secondary | ICD-10-CM | POA: Insufficient documentation

## 2013-02-27 DIAGNOSIS — M25569 Pain in unspecified knee: Secondary | ICD-10-CM | POA: Insufficient documentation

## 2013-02-27 DIAGNOSIS — IMO0001 Reserved for inherently not codable concepts without codable children: Secondary | ICD-10-CM | POA: Insufficient documentation

## 2013-02-27 DIAGNOSIS — R5381 Other malaise: Secondary | ICD-10-CM | POA: Insufficient documentation

## 2013-02-28 ENCOUNTER — Other Ambulatory Visit: Payer: Self-pay | Admitting: Family Medicine

## 2013-03-04 ENCOUNTER — Ambulatory Visit: Payer: Medicare Other | Admitting: Physical Therapy

## 2013-03-04 ENCOUNTER — Other Ambulatory Visit: Payer: Self-pay | Admitting: *Deleted

## 2013-03-04 MED ORDER — ALENDRONATE SODIUM 70 MG PO TABS: 70.0000 mg | ORAL_TABLET | ORAL | Status: DC

## 2013-03-05 DIAGNOSIS — Z842 Family history of other diseases of the genitourinary system: Secondary | ICD-10-CM | POA: Insufficient documentation

## 2013-03-05 DIAGNOSIS — R102 Pelvic and perineal pain: Secondary | ICD-10-CM | POA: Insufficient documentation

## 2013-03-05 DIAGNOSIS — IMO0002 Reserved for concepts with insufficient information to code with codable children: Secondary | ICD-10-CM | POA: Insufficient documentation

## 2013-03-05 DIAGNOSIS — G8929 Other chronic pain: Secondary | ICD-10-CM | POA: Insufficient documentation

## 2013-03-05 DIAGNOSIS — N952 Postmenopausal atrophic vaginitis: Secondary | ICD-10-CM | POA: Insufficient documentation

## 2013-03-06 ENCOUNTER — Ambulatory Visit: Payer: Medicare Other | Admitting: Physical Therapy

## 2013-03-07 ENCOUNTER — Other Ambulatory Visit: Payer: Self-pay | Admitting: *Deleted

## 2013-03-07 MED ORDER — ALENDRONATE SODIUM 70 MG PO TABS: 70.0000 mg | ORAL_TABLET | ORAL | Status: DC

## 2013-03-11 ENCOUNTER — Telehealth: Payer: Self-pay | Admitting: Hematology and Oncology

## 2013-03-11 ENCOUNTER — Ambulatory Visit: Payer: Medicare Other | Admitting: Physical Therapy

## 2013-03-11 NOTE — Telephone Encounter (Signed)
S/W THE PT AND SHE IS AWARE OF HER AUG APPTS FOR LAB AND THE MD VISIT WITH DR Karel Jarvis

## 2013-03-13 ENCOUNTER — Ambulatory Visit: Payer: Medicare Other

## 2013-03-18 ENCOUNTER — Ambulatory Visit: Payer: Medicare Other | Admitting: Physical Therapy

## 2013-03-19 ENCOUNTER — Ambulatory Visit: Payer: Medicare Other | Admitting: Physical Therapy

## 2013-03-25 ENCOUNTER — Telehealth: Payer: Self-pay | Admitting: Family Medicine

## 2013-03-25 NOTE — Telephone Encounter (Signed)
Patient wanted to make you aware that she is seeing a specialist that does uro-gynocology, dr Vernon Prey for pelvic wall pain.  Appt is for 05/09/13.    Thanks.

## 2013-03-29 ENCOUNTER — Telehealth: Payer: Self-pay | Admitting: Oncology

## 2013-04-01 ENCOUNTER — Other Ambulatory Visit: Payer: Self-pay | Admitting: Family Medicine

## 2013-04-03 ENCOUNTER — Telehealth: Payer: Self-pay | Admitting: *Deleted

## 2013-04-03 ENCOUNTER — Other Ambulatory Visit: Payer: Self-pay | Admitting: Family Medicine

## 2013-04-03 NOTE — Telephone Encounter (Signed)
Returning pt's call from her husband. lvm informing that we faxed back the auth for her med and that she will need to check with her pharmacy.Loralee Pacas [place]

## 2013-04-08 ENCOUNTER — Telehealth: Payer: Self-pay | Admitting: *Deleted

## 2013-04-08 MED ORDER — IVERMECTIN 0.5 % EX LOTN: 1.0000 "application " | TOPICAL_LOTION | Freq: Once | CUTANEOUS | Status: DC

## 2013-04-08 NOTE — Telephone Encounter (Signed)
Husband calls and states you called in [place] for [name] last week for lice and now her and their son who lives in the home have it and both have done 2 treatments OTC and not working.  Want to know if you would give them the [facility] for her and the son [name]

## 2013-04-08 NOTE — Telephone Encounter (Signed)
Sent ot her pharmacy

## 2013-04-08 NOTE — Telephone Encounter (Signed)
Husband notified med sent. Barry Dienes, LPN

## 2013-04-15 ENCOUNTER — Other Ambulatory Visit: Payer: Medicare Other | Admitting: Lab

## 2013-04-15 ENCOUNTER — Ambulatory Visit: Payer: Medicare Other

## 2013-05-27 ENCOUNTER — Ambulatory Visit (INDEPENDENT_AMBULATORY_CARE_PROVIDER_SITE_OTHER): Payer: Medicare Other | Admitting: Family Medicine

## 2013-05-27 ENCOUNTER — Encounter: Payer: Self-pay | Admitting: Family Medicine

## 2013-05-27 VITALS — BP 127/80 | HR 96 | Wt 120.0 lb

## 2013-05-27 DIAGNOSIS — R634 Abnormal weight loss: Secondary | ICD-10-CM

## 2013-05-27 DIAGNOSIS — L853 Xerosis cutis: Secondary | ICD-10-CM

## 2013-05-27 DIAGNOSIS — R102 Pelvic and perineal pain: Secondary | ICD-10-CM

## 2013-05-27 DIAGNOSIS — N949 Unspecified condition associated with female genital organs and menstrual cycle: Secondary | ICD-10-CM

## 2013-05-27 DIAGNOSIS — E039 Hypothyroidism, unspecified: Secondary | ICD-10-CM

## 2013-05-27 DIAGNOSIS — N390 Urinary tract infection, site not specified: Secondary | ICD-10-CM

## 2013-05-27 DIAGNOSIS — R350 Frequency of micturition: Secondary | ICD-10-CM

## 2013-05-27 DIAGNOSIS — I10 Essential (primary) hypertension: Secondary | ICD-10-CM

## 2013-05-27 DIAGNOSIS — L738 Other specified follicular disorders: Secondary | ICD-10-CM

## 2013-05-27 DIAGNOSIS — Z23 Encounter for immunization: Secondary | ICD-10-CM

## 2013-05-27 LAB — COMPLETE METABOLIC PANEL WITH GFR
ALT: 16 U/L (ref 0–35)
AST: 23 U/L (ref 0–37)
Calcium: 10 mg/dL (ref 8.4–10.5)
Creat: 0.71 mg/dL (ref 0.50–1.10)
GFR, Est Non African American: 89 mL/min
Glucose, Bld: 91 mg/dL (ref 70–99)
Total Bilirubin: 0.4 mg/dL (ref 0.3–1.2)

## 2013-05-27 LAB — POCT URINALYSIS DIPSTICK
Glucose, UA: NEGATIVE
Ketones, UA: NEGATIVE
Leukocytes, UA: NEGATIVE
Nitrite, UA: NEGATIVE
Protein, UA: NEGATIVE
Urobilinogen, UA: 0.2
pH, UA: 8

## 2013-05-27 LAB — TSH: TSH: 3.543 u[IU]/mL (ref 0.350–4.500)

## 2013-05-27 NOTE — Progress Notes (Signed)
Subjective:    Patient ID: [name], female    DOB: May 27, 1946, 67 y.o.   MRN: [id number]  HPI HTN-  Pt denies chest pain, SOB, dizziness, or heart palpitations.  Taking meds as directed w/o problems.  Denies medication side effects.  She has lost 4 lbs, unintentional he.  Says occ dec appetite.   Urinary frequency and some diarrhea. No dysuria. No fever. On macrobid. She also wanted to give me an update. She has had Rocephin since I last saw her, approximately a month ago for urinary tract infection at her urologist office. And she did not have a reaction to it. Oral mucus from her allergy list. The time before she had some itching around her mouth and felt that could be causing a problem.  Hypothyroid - has had some dry skin. More than usual.  She has lost 4 pounds unintentionally. Last level VI months ago was normal. She's taking her medications as prescribed.  Vaginal pain - now on premarin. HAs f/U with doc next month for this. He has had a lot of dryness of the labia as well. She did try the parenchyma mesentery and it did not help. She is now using Vaseline and just wants to make sure that that's okay.  She's also having a lot of itching in her throat and ears is taking her Singulair, her antihistamine, nasal steroid spray and nasal antihistamine. She does follow up with her allergist next month. Review of Systems  BP 127/80  Pulse 96  Wt 120 lb (54.432 kg)  BMI 18.51 kg/m2    Allergies  Allergen Reactions  . Cefadroxil     REACTION: rash  . Doxycycline Itching  . Simvastatin Other (See Comments)    Aches and memory loss  . Dicloxacillin Rash    Past Medical History  Diagnosis Date  . Hyperlipidemia   . Recurrent sinusitis   . Breast cancer 1999    T1N0 LEFT BREAST  . 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 Osteoporosis 03-04-2011     LAST BONE DENSITY 03-04-2011  . Trigeminal neuralgia   . Degenerative disc disease   .  History of bladder infections     RECURRENT  . Thyroid disease     Past Surgical History  Procedure Laterality Date  . Bladder suspension  2005  . Left breast lumpectomy  1999  . Cancerous ln removed  2008  . Breast surgery  1999    Lumpectomy L Br  . Breast surgery  2007    Lymph Node Removal L Chest Wall  . Abdominal hysterectomy  2005    Complete    History   Social History  . Marital Status: Married    Spouse Name: N/A    Number of Children: N/A  . Years of Education: N/A   Occupational History  . Not on file.   Social History Main Topics  . Smoking status: Never Smoker   . Smokeless tobacco: Never Used  . Alcohol Use: No  . Drug Use: No  . Sexual Activity: Not on file     Comment: housewife, married, 3 kids.   Other Topics Concern  . Not on file   Social History Narrative  . No narrative on file    Family History  Problem Relation Age of Onset  . Cancer Mother     breast  . Stroke Mother   . Hyperlipidemia Mother   . Cancer Father  bladder  . Cancer Son     Breast  . Cancer Maternal Aunt     Breast  . Stomach cancer Paternal Uncle   . Cancer Paternal Grandmother     Mouth  . Stomach cancer Paternal Uncle     Outpatient Encounter Prescriptions as of 05/27/2013  Medication Sig Dispense Refill  . conjugated estrogens (PREMARIN) vaginal cream Place 0.625 g vaginally daily. Apply a pea size amount into vagina for 3 weeks      . alendronate (FOSAMAX) 70 MG tablet Take 1 tablet (70 mg total) by mouth every 7 (seven) days. Take with a full glass of water on an empty stomach.  12 tablet  3  . azelastine (ASTELIN) 137 MCG/SPRAY nasal spray Place 2 sprays into the nose Twice daily.      . Calcium 1250 MG TABS Take by mouth.        . cholecalciferol (VITAMIN D) 1000 UNITS tablet Take 1,000 Units by mouth daily.        . fish oil-omega-3 fatty acids 1000 MG capsule Take 2 g by mouth daily.        . fluconazole (DIFLUCAN) 150 MG tablet Take one tab now  and then one after finish antibiotic.  2 tablet  0  . HYDROcodone-acetaminophen (NORCO/VICODIN) 5-325 MG per tablet TAKE ONE TABLET BY MOUTH EVERY 12 HOURS AS NEEDED FOR PAIN  180 tablet  0  . latanoprost (XALATAN) 0.005 % ophthalmic solution Place 1 drop into both eyes At bedtime.      Marland Kitchen letrozole (FEMARA) 2.5 MG tablet TAKE 1 TABLET (2.5 MG TOTAL) BY MOUTH DAILY.  90 tablet  1  . levocetirizine (XYZAL) 5 MG tablet Take 5 mg by mouth daily.        Marland Kitchen levothyroxine (SYNTHROID, LEVOTHROID) 50 MCG tablet TAKE ONE TABLET BY MOUTH ONCE DAILY  90 tablet  1  . LORazepam (ATIVAN) 1 MG tablet TAKE ONE TABLET BY MOUTH EVERY DAY AS NEEDED  180 tablet  0  . meloxicam (MOBIC) 7.5 MG tablet take 1 tablet by mouth once daily  30 tablet  3  . montelukast (SINGULAIR) 10 MG tablet Take 1 tablet (10 mg total) by mouth at bedtime.  180 tablet  1  . nitrofurantoin, macrocrystal-monohydrate, (MACROBID) 100 MG capsule Take 1 capsule (100 mg total) by mouth daily.  90 capsule  1  . omeprazole (PRILOSEC) 20 MG capsule Take 1 capsule (20 mg total) by mouth 2 (two) times daily.  360 capsule  1  . pravastatin (PRAVACHOL) 40 MG tablet TAKE ONE TABLET BY MOUTH EVERY DAY  90 tablet  2  . traMADol (ULTRAM) 50 MG tablet TAKE ONE TABLET BY MOUTH TWICE DAILY AS NEEDED FOR PAIN  360 tablet  0  . triamterene-hydrochlorothiazide (MAXZIDE-25) 37.5-25 MG per tablet TAKE ONE TABLET BY MOUTH ONCE DAILY.  90 tablet  1  . [DISCONTINUED] cyclobenzaprine (FLEXERIL) 10 MG tablet TAKE ONE TABLET BY MOUTH EVERY DAY AT BEDTIME AS NEEDED FOR MUSCLE SPASM  90 tablet  0  . [DISCONTINUED] fluticasone (FLONASE) 50 MCG/ACT nasal spray Place 2 sprays into the nose 2 (two) times daily.      . [DISCONTINUED] fosfomycin (MONUROL) 3 G PACK Take 3 g by mouth once.  3 g  0  . [DISCONTINUED] Ivermectin (SKLICE) 0.5 % LOTN Apply 1 application topically once.  117 g  1  . [DISCONTINUED] Olopatadine HCl (PATADAY) 0.2 % SOLN Apply 1 drop to eye daily as needed.        . [  DISCONTINUED] pravastatin (PRAVACHOL) 40 MG tablet Take 1 tablet (40 mg total) by mouth at bedtime.  90 tablet  1  . [DISCONTINUED] Red Yeast Rice 600 MG CAPS Take by mouth.         No facility-administered encounter medications on file as of 05/27/2013.          Objective:   Physical Exam  Constitutional: She is oriented to person, place, and time. She appears well-developed and well-nourished.  HENT:  Head: Normocephalic and atraumatic.  Cardiovascular: Normal rate, regular rhythm and normal heart sounds.   Pulmonary/Chest: Effort normal and breath sounds normal.  Neurological: She is alert and oriented to person, place, and time.  Skin: Skin is warm and dry.  Psychiatric: She has a normal mood and affect. Her behavior is normal.          Assessment & Plan:  HTN- well controlled. F/U in 6 months.   Urinary frequency - UA neg will send for culture. Call if suddenly getting wrose. I did hold off on giving her prescription today. She is on prophylaxis.  Hypothyroid - do to recheck TSH since changes in dry skin and unintentional weight loss.  Vaginal pain-hopefully the Premarin will be helpful for her. She started with some improvement. Vaseline to the external labia is perfectly fine as treatment for dry skin.   Allergic rhinitis-explained to Her that typically itching in the nose and ears is a symptom of allergic rhinitis. Currently her therapy is maximized. Encouraged her to discuss this with her allergist next month.

## 2013-05-28 LAB — URINE CULTURE
Colony Count: NO GROWTH
Organism ID, Bacteria: NO GROWTH

## 2013-05-29 ENCOUNTER — Encounter: Payer: Self-pay | Admitting: Oncology

## 2013-05-29 ENCOUNTER — Telehealth: Payer: Self-pay | Admitting: *Deleted

## 2013-05-29 ENCOUNTER — Ambulatory Visit (HOSPITAL_BASED_OUTPATIENT_CLINIC_OR_DEPARTMENT_OTHER): Payer: Medicare Other | Admitting: Oncology

## 2013-05-29 ENCOUNTER — Other Ambulatory Visit (HOSPITAL_BASED_OUTPATIENT_CLINIC_OR_DEPARTMENT_OTHER): Payer: Medicare Other | Admitting: Lab

## 2013-05-29 VITALS — BP 141/76 | HR 99 | Temp 98.7°F | Resp 20 | Ht 67.5 in | Wt 120.8 lb

## 2013-05-29 DIAGNOSIS — C50919 Malignant neoplasm of unspecified site of unspecified female breast: Secondary | ICD-10-CM

## 2013-05-29 DIAGNOSIS — Z171 Estrogen receptor negative status [ER-]: Secondary | ICD-10-CM

## 2013-05-29 DIAGNOSIS — M810 Age-related osteoporosis without current pathological fracture: Secondary | ICD-10-CM

## 2013-05-29 LAB — COMPREHENSIVE METABOLIC PANEL (CC13)
ALT: 16 U/L (ref 0–55)
AST: 23 U/L (ref 5–34)
Albumin: 4 g/dL (ref 3.5–5.0)
BUN: 11.8 mg/dL (ref 7.0–26.0)
Calcium: 9.2 mg/dL (ref 8.4–10.4)
Chloride: 98 mEq/L (ref 98–109)
Potassium: 3.8 mEq/L (ref 3.5–5.1)
Sodium: 135 mEq/L — ABNORMAL LOW (ref 136–145)
Total Protein: 6.5 g/dL (ref 6.4–8.3)

## 2013-05-29 LAB — CBC WITH DIFFERENTIAL/PLATELET
BASO%: 0.6 % (ref 0.0–2.0)
Basophils Absolute: 0 10*3/uL (ref 0.0–0.1)
EOS%: 0.8 % (ref 0.0–7.0)
HGB: 12.1 g/dL (ref 11.6–15.9)
LYMPH%: 35.4 % (ref 14.0–49.7)
MCH: 31.4 pg (ref 25.1–34.0)
MCHC: 33.9 g/dL (ref 31.5–36.0)
NEUT#: 2.3 10*3/uL (ref 1.5–6.5)
Platelets: 210 10*3/uL (ref 145–400)
RBC: 3.84 10*6/uL (ref 3.70–5.45)
RDW: 12.9 % (ref 11.2–14.5)
lymph#: 1.5 10*3/uL (ref 0.9–3.3)

## 2013-05-29 NOTE — Patient Instructions (Addendum)
We will order bone density in March  Continue femara daily  We will see you back in 6 months

## 2013-05-29 NOTE — Telephone Encounter (Signed)
appts made and printed. Pt is aware that I emailed Misty Stanley for her to get an genetic appt...td

## 2013-05-29 NOTE — Telephone Encounter (Signed)
Pt informed me that back in august she got an Rx for vicodin sent to [facility] and a week later she stated that [facility] called and stated that they had a Rx for vicodin for her also. I looked back in her chart and did not see on her med list where we had sent this in for her. She believes that she may have inquired about this thru [facility] and they may have filled it . She stated that [facility] called her telling her that this order was ready to be shipped out and she informed them to cancel the order. I informed her to make sure that they have cancelled the order and if she should have any problems to call our office back. She voiced understanding and agreed.Loralee Pacas [place]

## 2013-05-30 NOTE — Telephone Encounter (Signed)
ok 

## 2013-06-03 ENCOUNTER — Telehealth: Payer: Self-pay | Admitting: *Deleted

## 2013-06-03 NOTE — Telephone Encounter (Signed)
Left message for pt to return my call so I can schedule a genetic appt.  

## 2013-06-03 NOTE — Telephone Encounter (Signed)
Pt returned my call & I informed her that I was calling to schedule a genetic appt and she does not want to schedule that appt at this time.  She wants to think about it and if she decided to schedule it then she will call us back to schedule it.

## 2013-06-11 ENCOUNTER — Telehealth: Payer: Self-pay | Admitting: *Deleted

## 2013-06-11 MED ORDER — PERMETHRIN 5 % EX CREA: TOPICAL_CREAM | CUTANEOUS | Status: DC

## 2013-06-11 NOTE — Telephone Encounter (Signed)
permethrin sent to [facility].

## 2013-06-17 NOTE — Progress Notes (Signed)
OFFICE PROGRESS NOTE  CCNani Gasser, MD [address] [place] [address] Dr. Ovidio Kin  DIAGNOSIS: 67 year old female with history of T1 N0 left breast cancer 1999  PRIOR THERAPY:  #11999 patient had a T1 N0 left breast cancer that was treated with a lumpectomy and sentinel lymph node biopsy locally radiated with 5 years of tamoxifen. She was then observed.  #2 she was then found to have involvement of an apparently isolated internal mammary node identified on a screening breast MRI in August 2007. She had a core needle biopsy performed on 05/23/2006 that showed metastatic carcinoma consistent with breast primary. It was ER +76% PR negative HER-2/neu negative by fish. Patient was treated with Xeloda and Taxotere followed by resection of the node in January 2008 by Dr. Karle Plumber. With the pathology showing that this was still positive though there was no extracapsular extension.  #3 patient continued Femara starting February 2008. She has had some staging studies including CT scans as well as PET scans.  #4 Patient was followed by Dr. Darrold Span and in January 2014 she had a left breast ultrasound performed do to nipple irritation. However worked all is negative. She was continued on letrozole.  CURRENT THERAPY:letrozole 2.5 mg daily  INTERVAL HISTORY: [name] 67 y.o. female returns for followup visit to establish your care. Overall she's doing well she's tolerating the letrozole quite nicely. Of note patient does have process last bone density  Did reveal process this was last year. She will get another bone density November 2014. She is on Fosamax tolerating it well. Patient does have history of degenerative disc disease and degenerative arthritis. She does have aches and pains and hot flashes. She does have also history of cystitis or recurrent UTIs.  MEDICAL HISTORY: Past Medical History  Diagnosis Date  . Hyperlipidemia   . Recurrent  sinusitis   . Breast cancer 1999    T1N0 LEFT BREAST  . 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 Osteoporosis 03-04-2011     LAST BONE DENSITY 03-04-2011  . Trigeminal neuralgia   . Degenerative disc disease   . History of bladder infections     RECURRENT  . Thyroid disease     ALLERGIES:  is allergic to cefadroxil; doxycycline; simvastatin; and dicloxacillin.  MEDICATIONS:  Current Outpatient Prescriptions  Medication Sig Dispense Refill  . alendronate (FOSAMAX) 70 MG tablet Take 1 tablet (70 mg total) by mouth every 7 (seven) days. Take with a full glass of water on an empty stomach.  12 tablet  3  . azelastine (ASTELIN) 137 MCG/SPRAY nasal spray Place 2 sprays into the nose Twice daily.      . Calcium 1250 MG TABS Take by mouth.        . cholecalciferol (VITAMIN D) 1000 UNITS tablet Take 1,000 Units by mouth daily.        Marland Kitchen conjugated estrogens (PREMARIN) vaginal cream Place 0.625 g vaginally daily. Apply a pea size amount into vagina for 3 weeks      . fish oil-omega-3 fatty acids 1000 MG capsule Take 2 g by mouth daily.        . fluconazole (DIFLUCAN) 150 MG tablet Take one tab now and then one after finish antibiotic.  2 tablet  0  . HYDROcodone-acetaminophen (NORCO/VICODIN) 5-325 MG per tablet TAKE ONE TABLET BY MOUTH EVERY 12 HOURS AS NEEDED FOR PAIN  180 tablet  0  .  latanoprost (XALATAN) 0.005 % ophthalmic solution Place 1 drop into both eyes At bedtime.      Marland Kitchen letrozole (FEMARA) 2.5 MG tablet TAKE 1 TABLET (2.5 MG TOTAL) BY MOUTH DAILY.  90 tablet  1  . levocetirizine (XYZAL) 5 MG tablet Take 5 mg by mouth daily.        Marland Kitchen levothyroxine (SYNTHROID, LEVOTHROID) 50 MCG tablet TAKE ONE TABLET BY MOUTH ONCE DAILY  90 tablet  1  . LORazepam (ATIVAN) 1 MG tablet TAKE ONE TABLET BY MOUTH EVERY DAY AS NEEDED  180 tablet  0  . meloxicam (MOBIC) 7.5 MG tablet take 1 tablet by mouth once daily  30 tablet  3  . montelukast (SINGULAIR) 10 MG  tablet Take 1 tablet (10 mg total) by mouth at bedtime.  180 tablet  1  . nitrofurantoin, macrocrystal-monohydrate, (MACROBID) 100 MG capsule Take 1 capsule (100 mg total) by mouth daily.  90 capsule  1  . omeprazole (PRILOSEC) 20 MG capsule Take 1 capsule (20 mg total) by mouth 2 (two) times daily.  360 capsule  1  . permethrin (ELIMITE) 5 % cream Apply topically once.  60 g  0  . pravastatin (PRAVACHOL) 40 MG tablet TAKE ONE TABLET BY MOUTH EVERY DAY  90 tablet  2  . traMADol (ULTRAM) 50 MG tablet TAKE ONE TABLET BY MOUTH TWICE DAILY AS NEEDED FOR PAIN  360 tablet  0  . triamterene-hydrochlorothiazide (MAXZIDE-25) 37.5-25 MG per tablet TAKE ONE TABLET BY MOUTH ONCE DAILY.  90 tablet  1   No current facility-administered medications for this visit.    SURGICAL HISTORY:  Past Surgical History  Procedure Laterality Date  . Bladder suspension  2005  . Left breast lumpectomy  1999  . Cancerous ln removed  2008  . Breast surgery  1999    Lumpectomy L Br  . Breast surgery  2007    Lymph Node Removal L Chest Wall  . Abdominal hysterectomy  2005    Complete    REVIEW OF SYSTEMS:  Pertinent items are noted in HPI.   HEALTH MAINTENANCE:  PHYSICAL EXAMINATION: Blood pressure 141/76, pulse 99, temperature 98.7 F (37.1 C), temperature source Oral, resp. rate 20, height 5' 7.5" (1.715 m), weight 120 lb 12.8 oz (54.795 kg). Body mass index is 18.63 kg/(m^2). ECOG PERFORMANCE STATUS: 0 - Asymptomatic   General appearance: alert, cooperative and appears stated age Neck: no adenopathy, no carotid bruit, no JVD, supple, symmetrical, trachea midline and thyroid not enlarged, symmetric, no tenderness/mass/nodules Lymph nodes: Cervical, supraclavicular, and axillary nodes normal. Resp: clear to auscultation bilaterally Back: symmetric, no curvature. ROM normal. No CVA tenderness. Cardio: regular rate and rhythm GI: soft, non-tender; bowel sounds normal; no masses,  no organomegaly Extremities:  extremities normal, atraumatic, no cyanosis or edema Neurologic: Grossly normal   LABORATORY DATA: Lab Results  Component Value Date   WBC 4.1 05/29/2013   HGB 12.1 05/29/2013   HCT 35.7 05/29/2013   MCV 92.8 05/29/2013   PLT 210 05/29/2013      Chemistry      Component Value Date/Time   NA 135* 05/29/2013 1343   NA 134* 05/27/2013 1044   K 3.8 05/29/2013 1343   K 4.0 05/27/2013 1044   CL 92* 05/27/2013 1044   CL 98 10/02/2012 1125   CO2 29 05/29/2013 1343   CO2 31 05/27/2013 1044   BUN 11.8 05/29/2013 1343   BUN 12 05/27/2013 1044   CREATININE 0.7 05/29/2013 1343   CREATININE  0.71 05/27/2013 1044   CREATININE 0.72 01/11/2012 0922      Component Value Date/Time   CALCIUM 9.2 05/29/2013 1343   CALCIUM 10.0 05/27/2013 1044   ALKPHOS 44 05/29/2013 1343   ALKPHOS 41 05/27/2013 1044   AST 23 05/29/2013 1343   AST 23 05/27/2013 1044   ALT 16 05/29/2013 1343   ALT 16 05/27/2013 1044   BILITOT 0.21 05/29/2013 1343   BILITOT 0.4 05/27/2013 1044       RADIOGRAPHIC STUDIES:  No results found.  ASSESSMENT: 67 year old female with  #1 history of T1 N0 breast cancer of the left breast in 1999 with subsequent recurrence 21 internal mammary lymph node in August 2007. She received chemotherapy radiation and then has been continued on Femara tolerating it well.  #2 patient with intermittent pain and erythema of the left nipple since fall 2011 she did not have any evidence of cancer recurrence.  #3 osteoporosis on Fosamax to have a bone density scan in November 2014.   PLAN:   #1 continue with Femara 2.5 mg daily.  #2 patient will have another bone density scan performed this November. She needs these on a yearly basis. She is at high risk for ongoing issues with osteoporosis. If her Fosamax does not help then she may be a good candidate for prolia. She understands risks benefits of bisphosphonate therapy.  #3 she will be seen back in 6 months time or sooner if need arises.   All questions were  answered. The patient knows to call the clinic with any problems, questions or concerns. We can certainly see the patient much sooner if necessary.  I spent 20 minutes counseling the patient face to face. The total time spent in the appointment was 25 minutes.    Drue Second, MD Medical/Oncology [facility] [phone] (beeper) [phone] (Office)

## 2013-07-01 ENCOUNTER — Telehealth: Payer: Self-pay | Admitting: Family Medicine

## 2013-07-01 NOTE — Telephone Encounter (Signed)
Patient called in and needs a script for hydrocodone and it is due for refill on 11/6.  She will pick up when ready.

## 2013-07-02 MED ORDER — HYDROCODONE-ACETAMINOPHEN 5-325 MG PO TABS: ORAL_TABLET | ORAL | Status: DC

## 2013-07-02 NOTE — Telephone Encounter (Signed)
Pt called and lvm informing her that rx is up front.[name]

## 2013-08-07 ENCOUNTER — Other Ambulatory Visit: Payer: Self-pay | Admitting: *Deleted

## 2013-08-07 MED ORDER — HYDROCODONE-ACETAMINOPHEN 5-325 MG PO TABS: ORAL_TABLET | ORAL | Status: DC

## 2013-08-08 ENCOUNTER — Other Ambulatory Visit: Payer: Self-pay | Admitting: *Deleted

## 2013-08-08 MED ORDER — LEVOTHYROXINE SODIUM 50 MCG PO TABS: ORAL_TABLET | ORAL | Status: DC

## 2013-08-12 ENCOUNTER — Telehealth: Payer: Self-pay | Admitting: Oncology

## 2013-08-12 NOTE — Telephone Encounter (Signed)
, °

## 2013-08-19 ENCOUNTER — Other Ambulatory Visit: Payer: Self-pay | Admitting: *Deleted

## 2013-08-19 MED ORDER — LEVOTHYROXINE SODIUM 50 MCG PO TABS: ORAL_TABLET | ORAL | Status: DC

## 2013-08-21 ENCOUNTER — Ambulatory Visit (INDEPENDENT_AMBULATORY_CARE_PROVIDER_SITE_OTHER): Payer: Medicare Other | Admitting: Surgery

## 2013-08-21 ENCOUNTER — Encounter (INDEPENDENT_AMBULATORY_CARE_PROVIDER_SITE_OTHER): Payer: Self-pay | Admitting: Surgery

## 2013-08-21 ENCOUNTER — Encounter (INDEPENDENT_AMBULATORY_CARE_PROVIDER_SITE_OTHER): Payer: Self-pay

## 2013-08-21 VITALS — BP 118/68 | HR 84 | Temp 98.4°F | Resp 14 | Ht 67.0 in | Wt 120.8 lb

## 2013-08-21 DIAGNOSIS — C50919 Malignant neoplasm of unspecified site of unspecified female breast: Secondary | ICD-10-CM

## 2013-08-21 DIAGNOSIS — C50912 Malignant neoplasm of unspecified site of left female breast: Secondary | ICD-10-CM

## 2013-08-21 NOTE — Progress Notes (Signed)
Re:   [name] DOB:   [birth year] MRN:   [id number]  ASSESSMENT AND PLAN: 1.  Left breast cancer -  Initial treated 1999 by Dr. Biagio Borg with lumpectomy and SLNBx.  Required internal mammary node resection by Dr. Delrae Alfred in Jan 2008.  She is followed by Dr. Ila Mcgill  She has been on Femara since then.  Doing well, disease free.  I'll see her back in one year.  2.  Left nipple changes.  No change since I saw.  It remains benign appearing to me.  I took photos of the nipple on note dated 10/04/2012.  3.  Osteroporosis 4.  DJD of back 5.  History of recurrent UTI  Bladder tack by Dr. Stark Bray around 2005.  Now followed by Dr. Kathie Rhodes. MacDiarmid 6.  Right cheek discomfort and numbness.  Blamed on old dental injury.  Sess Dr.Teoh 7.  Trigeminal neuralgia x 10 years.  Has seen Dr. Terrace Arabia.  Tegretol has resolved much of her symptoms.  Chief Complaint  Patient presents with  . Routine Post Op    reck lft nipple   REFERRING PHYSICIAN: METHENEY,CATHERINE, MD  HISTORY OF PRESENT ILLNESS: [name] is a 67 y.o. (DOB: [birth year])  white female whose primary care physician is METHENEY,CATHERINE, MD and comes to me today for changes in her left nipple.   Comes by herself. She did not make it back in three months like we had planned, but she has noticed no further change in her left nipple. She is dong well without complaints. She is still with Dr. Darrold Span.  Breast Cancer History: T1N0 left breast cancer in 1999, treated with lumpectomy and sentinel node evaluation (Dr Ginette Pitman), local radiation and 5 years of tamoxifen . She was followed on observation until she was found to have involvement of an apparently isolated internal mammary node identified on screening breast MRI in August 2007, core needle biopsy 05-23-2006 with metastatic carcinoma consistent with breast primary, ER + 76%, PR 0, HER 2 negative by FISH. She was treated with initial xeloda and taxotere, then resection of the  node in Jan 2008 by Dr Delle Reining path still positive, tho no extracapsular extension identified. She has continued Femara since Feb 2008, despite known low bone density, due to risk of progressive disease. Last bone density scan was at [facility] 02-2012. Last body CTs were 2009 and last PET 2008. I got the old chart. Dr. Maryagnes Amos last saw her in Dec 2005 -  Which predates her internal mammary node recurrence.  No further information identified.   Past Medical History  Diagnosis Date  . Hyperlipidemia   . Recurrent sinusitis   . Breast cancer 1999    T1N0 LEFT BREAST  . 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 Osteoporosis 03-04-2011     LAST BONE DENSITY 03-04-2011  . Trigeminal neuralgia   . Degenerative disc disease   . History of bladder infections     RECURRENT  . Thyroid disease       Past Surgical History  Procedure Laterality Date  . Bladder suspension  2005  . Left breast lumpectomy  1999  . Cancerous ln removed  2008  . Breast surgery  1999    Lumpectomy L Br  . Breast surgery  2007    Lymph Node Removal L Chest Wall  . Abdominal hysterectomy  2005    Complete  Current Outpatient Prescriptions  Medication Sig Dispense Refill  . alendronate (FOSAMAX) 70 MG tablet Take 1 tablet (70 mg total) by mouth every 7 (seven) days. Take with a full glass of water on an empty stomach.  12 tablet  3  . azelastine (ASTELIN) 137 MCG/SPRAY nasal spray Place 2 sprays into the nose Twice daily.      . Calcium 1250 MG TABS Take by mouth.        . cholecalciferol (VITAMIN D) 1000 UNITS tablet Take 1,000 Units by mouth daily.        Marland Kitchen conjugated estrogens (PREMARIN) vaginal cream Place 0.625 g vaginally daily. Apply a pea size amount into vagina for 3 weeks      . fish oil-omega-3 fatty acids 1000 MG capsule Take 2 g by mouth daily.        . fluconazole (DIFLUCAN) 150 MG tablet Take one tab now and then one after finish antibiotic.   2 tablet  0  . HYDROcodone-acetaminophen (NORCO/VICODIN) 5-325 MG per tablet TAKE ONE TABLET BY MOUTH EVERY 12 HOURS AS NEEDED FOR PAIN  60 tablet  0  . latanoprost (XALATAN) 0.005 % ophthalmic solution Place 1 drop into both eyes At bedtime.      Marland Kitchen letrozole (FEMARA) 2.5 MG tablet TAKE 1 TABLET (2.5 MG TOTAL) BY MOUTH DAILY.  90 tablet  1  . levocetirizine (XYZAL) 5 MG tablet Take 5 mg by mouth daily.        Marland Kitchen levothyroxine (SYNTHROID, LEVOTHROID) 50 MCG tablet TAKE ONE TABLET BY MOUTH ONCE DAILY  90 tablet  1  . LORazepam (ATIVAN) 1 MG tablet TAKE ONE TABLET BY MOUTH EVERY DAY AS NEEDED  180 tablet  0  . meloxicam (MOBIC) 7.5 MG tablet take 1 tablet by mouth once daily  30 tablet  3  . montelukast (SINGULAIR) 10 MG tablet Take 1 tablet (10 mg total) by mouth at bedtime.  180 tablet  1  . nitrofurantoin (MACRODANTIN) 100 MG capsule       . nitrofurantoin, macrocrystal-monohydrate, (MACROBID) 100 MG capsule Take 1 capsule (100 mg total) by mouth daily.  90 capsule  1  . omeprazole (PRILOSEC) 20 MG capsule Take 1 capsule (20 mg total) by mouth 2 (two) times daily.  360 capsule  1  . permethrin (ELIMITE) 5 % cream Apply topically once.  60 g  0  . pravastatin (PRAVACHOL) 40 MG tablet TAKE ONE TABLET BY MOUTH EVERY DAY  90 tablet  2  . TEGRETOL-XR 100 MG 12 hr tablet       . traMADol (ULTRAM) 50 MG tablet TAKE ONE TABLET BY MOUTH TWICE DAILY AS NEEDED FOR PAIN  360 tablet  0  . triamterene-hydrochlorothiazide (MAXZIDE-25) 37.5-25 MG per tablet TAKE ONE TABLET BY MOUTH ONCE DAILY.  90 tablet  1   No current facility-administered medications for this visit.    Allergies  Allergen Reactions  . Cefadroxil     REACTION: rash  . Doxycycline Itching  . Simvastatin Other (See Comments)    Aches and memory loss  . Dicloxacillin Rash   REVIEW OF SYSTEMS: Neurologic:  Trigeminal neuralgia of left face.  Has seen Dr.Yan.  Symptoms controlled with Tegretol Pulmonary:  Does not smoke cigarettes.  No  asthma or bronchitis.  No OSA/CPAP.  History of allergies.  Has seen Dr. Irineo Axon. Urologic: History of recurrent UTI.  Bladder tack by Dr. Stark Bray around 2005.  Now followed by Dr. Mina Marble  SOCIAL and FAMILY HISTORY:  Sister died in Jan 2014 - renal failure and sepsis. Married.  PHYSICAL EXAM: Ht 5\' 7"  (1.702 m)  Wt 120 lb 12.8 oz (54.795 kg)  BMI 18.92 kg/m2  General: Thin WF who is alert and generally healthy appearing.  HEENT: Normal. Pupils equal. Neck: Supple. No mass.  No thyroid mass. Lymph Nodes:  No supraclavicular or cervical nodes. Breasts:  Right - Normal  Left - Slightly more rounded left nipple, but no mass or nodule or area of concern.  Scar at 6 o'clock with loss of breast tissue in the inferior aspect of the breast. Extremities:   No lymphedema.  DATA REVIEWED: Mammogram and MRI reports.  Ovidio Kin, MD,  [facility] [address] [place]  [address]   [place], [place]    [address] Phone:  [phone] FAX:  [phone]

## 2013-08-27 ENCOUNTER — Other Ambulatory Visit: Payer: Self-pay

## 2013-08-27 NOTE — Telephone Encounter (Signed)
Please print prescription. Fax number on prescription. Thanks

## 2013-08-30 ENCOUNTER — Other Ambulatory Visit: Payer: Self-pay | Admitting: *Deleted

## 2013-08-30 MED ORDER — LORAZEPAM 1 MG PO TABS: ORAL_TABLET | ORAL | Status: DC

## 2013-08-30 NOTE — Telephone Encounter (Signed)
Med called in to [facility] drug.[name] [place]

## 2013-09-11 ENCOUNTER — Other Ambulatory Visit: Payer: Self-pay | Admitting: *Deleted

## 2013-09-11 MED ORDER — HYDROCODONE-ACETAMINOPHEN 5-325 MG PO TABS: ORAL_TABLET | ORAL | Status: DC

## 2013-09-26 ENCOUNTER — Telehealth: Payer: Self-pay | Admitting: Neurology

## 2013-09-26 ENCOUNTER — Telehealth: Payer: Self-pay | Admitting: *Deleted

## 2013-09-26 ENCOUNTER — Other Ambulatory Visit: Payer: Self-pay | Admitting: *Deleted

## 2013-09-26 MED ORDER — TRIAMTERENE-HCTZ 37.5-25 MG PO TABS: ORAL_TABLET | ORAL | Status: DC

## 2013-09-26 MED ORDER — CARBAMAZEPINE ER 100 MG PO TB12: 200.0000 mg | ORAL_TABLET | Freq: Two times a day (BID) | ORAL | Status: DC

## 2013-09-26 NOTE — Telephone Encounter (Signed)
Rx has been sent  

## 2013-09-26 NOTE — Telephone Encounter (Signed)
Patient wanted to make you aware. FYI

## 2013-09-26 NOTE — Telephone Encounter (Signed)
Patient called to state that she has [employer] and she was notified that her Tegretol would be moved down from tier 4 to tier 3. Patient wanted to make provider aware.

## 2013-10-03 ENCOUNTER — Ambulatory Visit: Payer: Self-pay | Admitting: Nurse Practitioner

## 2013-10-09 ENCOUNTER — Telehealth: Payer: Self-pay | Admitting: Neurology

## 2013-10-09 NOTE — Telephone Encounter (Signed)
Info has been faxed to ins.  Pending their reply.

## 2013-10-09 NOTE — Telephone Encounter (Signed)
States her insurance company is sending faxes to try to get information about approve her medication at a tier 3 level so that she can obtain it through insurance.

## 2013-10-11 ENCOUNTER — Other Ambulatory Visit: Payer: Self-pay | Admitting: Oncology

## 2013-10-12 ENCOUNTER — Other Ambulatory Visit: Payer: Self-pay | Admitting: Oncology

## 2013-10-15 ENCOUNTER — Telehealth: Payer: Self-pay | Admitting: Oncology

## 2013-10-15 NOTE — Telephone Encounter (Signed)
, °

## 2013-10-16 ENCOUNTER — Telehealth: Payer: Self-pay | Admitting: Nurse Practitioner

## 2013-10-16 ENCOUNTER — Ambulatory Visit: Payer: Self-pay | Admitting: Nurse Practitioner

## 2013-10-16 NOTE — Telephone Encounter (Signed)
No show for scheduled appt 

## 2013-10-18 ENCOUNTER — Other Ambulatory Visit: Payer: Self-pay | Admitting: *Deleted

## 2013-10-18 MED ORDER — HYDROCODONE-ACETAMINOPHEN 5-325 MG PO TABS: ORAL_TABLET | ORAL | Status: DC

## 2013-10-29 ENCOUNTER — Ambulatory Visit
Admission: RE | Admit: 2013-10-29 | Discharge: 2013-10-29 | Disposition: A | Discharge status: Home / Self Care | Payer: Self-pay | Source: Ambulatory Visit | Attending: Oncology | Admitting: Oncology

## 2013-10-29 DIAGNOSIS — M810 Age-related osteoporosis without current pathological fracture: Secondary | ICD-10-CM

## 2013-11-04 ENCOUNTER — Other Ambulatory Visit: Payer: Medicare Other | Admitting: Lab

## 2013-11-04 ENCOUNTER — Other Ambulatory Visit: Payer: Medicare Other

## 2013-11-04 ENCOUNTER — Ambulatory Visit: Payer: Medicare Other | Admitting: Oncology

## 2013-11-06 ENCOUNTER — Ambulatory Visit: Payer: Medicare Other | Admitting: Oncology

## 2013-11-06 ENCOUNTER — Other Ambulatory Visit: Payer: Medicare Other

## 2013-11-06 NOTE — Progress Notes (Signed)
Report rcvd from [facility] - provided to Dr. Humphrey Rolls

## 2013-11-13 ENCOUNTER — Ambulatory Visit (INDEPENDENT_AMBULATORY_CARE_PROVIDER_SITE_OTHER): Payer: Medicare Other | Admitting: Family Medicine

## 2013-11-13 ENCOUNTER — Other Ambulatory Visit: Payer: Self-pay | Admitting: Oncology

## 2013-11-13 ENCOUNTER — Encounter: Payer: Self-pay | Admitting: Family Medicine

## 2013-11-13 VITALS — BP 143/78 | HR 104 | Ht 67.0 in | Wt 123.0 lb

## 2013-11-13 DIAGNOSIS — E782 Mixed hyperlipidemia: Secondary | ICD-10-CM

## 2013-11-13 DIAGNOSIS — S29012A Strain of muscle and tendon of back wall of thorax, initial encounter: Secondary | ICD-10-CM

## 2013-11-13 DIAGNOSIS — Z853 Personal history of malignant neoplasm of breast: Secondary | ICD-10-CM

## 2013-11-13 DIAGNOSIS — IMO0001 Reserved for inherently not codable concepts without codable children: Secondary | ICD-10-CM

## 2013-11-13 DIAGNOSIS — E039 Hypothyroidism, unspecified: Secondary | ICD-10-CM

## 2013-11-13 DIAGNOSIS — M546 Pain in thoracic spine: Secondary | ICD-10-CM

## 2013-11-13 DIAGNOSIS — Z23 Encounter for immunization: Secondary | ICD-10-CM

## 2013-11-13 LAB — LIPID PANEL
CHOL/HDL RATIO: 2.5 ratio
Cholesterol: 246 mg/dL — ABNORMAL HIGH (ref 0–200)
HDL: 99 mg/dL (ref >39)
LDL CALC: 116 mg/dL — AB (ref 0–99)
Triglycerides: 154 mg/dL — ABNORMAL HIGH (ref <150)
VLDL: 31 mg/dL (ref 0–40)

## 2013-11-13 LAB — COMPLETE METABOLIC PANEL WITH GFR
ALK PHOS: 39 U/L (ref 39–117)
ALT: 15 U/L (ref 0–35)
AST: 22 U/L (ref 0–37)
Albumin: 4.7 g/dL (ref 3.5–5.2)
BUN: 17 mg/dL (ref 6–23)
CO2: 33 mEq/L — ABNORMAL HIGH (ref 19–32)
Calcium: 10.3 mg/dL (ref 8.4–10.5)
Chloride: 98 mEq/L (ref 96–112)
Creat: 0.68 mg/dL (ref 0.50–1.10)
GFR, Est African American: >89 mL/min
GFR, Est Non African American: >89 mL/min
Glucose, Bld: 90 mg/dL (ref 70–99)
POTASSIUM: 4 meq/L (ref 3.5–5.3)
SODIUM: 137 meq/L (ref 135–145)
TOTAL PROTEIN: 6.6 g/dL (ref 6.0–8.3)
Total Bilirubin: 0.3 mg/dL (ref 0.2–1.2)

## 2013-11-13 LAB — TSH: TSH: 2.666 u[IU]/mL (ref 0.350–4.500)

## 2013-11-13 LAB — CK: Total CK: 47 U/L (ref 7–177)

## 2013-11-13 MED ORDER — PNEUMOCOCCAL 13-VAL CONJ VACC IM SUSP: 0.5000 mL | Freq: Once | INTRAMUSCULAR | Status: DC

## 2013-11-13 NOTE — Progress Notes (Signed)
   Subjective:    Patient ID: [name], female    DOB: 09-Feb-1946, 68 y.o.   MRN: [id number]  HPI Hypothyroidism-no recent skin or hair changes. No major changes in weight.  Taking medication as prescribed.  Hyperlipidemia-taking pravastatin 40 mg without any myalgias or side effects.  She has noticed some muscle soreness in her upper back between her shoulder blades in her spine and towards her shoulder. She's been holding her grand baby a lot and wonders if this may have been a trigger. She did go for massage which was helpful. He provided some pain relief for her. She's not really taking any medications for it. She denies any decrease in range of motion.    Review of Systems     Objective:   Physical Exam  Constitutional: She is oriented to person, place, and time. She appears well-developed and well-nourished.  HENT:  Head: Normocephalic and atraumatic.  Neck: Neck supple. No thyromegaly present.  Cardiovascular: Normal rate, regular rhythm and normal heart sounds.   Pulmonary/Chest: Effort normal and breath sounds normal.  Lymphadenopathy:    She has no cervical adenopathy.  Neurological: She is alert and oriented to person, place, and time.  Skin: Skin is warm and dry.  Psychiatric: She has a normal mood and affect. Her behavior is normal.          Assessment & Plan:  Hypothyroidism- well controlled. Continue current regimen. We'll recheck TSH.  Hyperlipidemia-due to repeat lipids. Last levels were a year ago. She is taking her pravastatin.  Due for mammogram and reminded to call. She says she did receive a card in the mail as a reminder.  Upper back pain strain-most consistent with muscular skeletal strain-continue with applying heat, massage and gentle stretches. If it's not improving in the next couple weeks and please let me know. Because she is on a statin we'll go ahead and check a CK today as well.  Prevnar 13 given today. We'll update Prevnar 23 next  year.

## 2013-11-13 NOTE — Patient Instructions (Signed)
Remember to call for your mammogram. If you needs any assistance from [place], or an official referral call and  we'll be happy to put in for you.

## 2013-11-18 ENCOUNTER — Other Ambulatory Visit: Payer: Self-pay | Admitting: Oncology

## 2013-11-20 ENCOUNTER — Other Ambulatory Visit (HOSPITAL_BASED_OUTPATIENT_CLINIC_OR_DEPARTMENT_OTHER): Payer: Medicare Other

## 2013-11-20 ENCOUNTER — Telehealth: Payer: Self-pay | Admitting: Family Medicine

## 2013-11-20 ENCOUNTER — Ambulatory Visit (HOSPITAL_BASED_OUTPATIENT_CLINIC_OR_DEPARTMENT_OTHER): Payer: Medicare Other | Admitting: Oncology

## 2013-11-20 ENCOUNTER — Encounter: Payer: Self-pay | Admitting: Oncology

## 2013-11-20 VITALS — BP 143/78 | HR 87 | Temp 98.2°F | Resp 20 | Ht 67.0 in | Wt 126.1 lb

## 2013-11-20 DIAGNOSIS — C50919 Malignant neoplasm of unspecified site of unspecified female breast: Secondary | ICD-10-CM

## 2013-11-20 DIAGNOSIS — M810 Age-related osteoporosis without current pathological fracture: Secondary | ICD-10-CM

## 2013-11-20 LAB — CBC WITH DIFFERENTIAL/PLATELET
BASO%: 0.4 % (ref 0.0–2.0)
Basophils Absolute: 0 10*3/uL (ref 0.0–0.1)
EOS%: 2.2 % (ref 0.0–7.0)
Eosinophils Absolute: 0.1 10*3/uL (ref 0.0–0.5)
HCT: 40.4 % (ref 34.8–46.6)
HGB: 13.4 g/dL (ref 11.6–15.9)
LYMPH%: 42.9 % (ref 14.0–49.7)
MCH: 31.3 pg (ref 25.1–34.0)
MCHC: 33.1 g/dL (ref 31.5–36.0)
MCV: 94.6 fL (ref 79.5–101.0)
MONO#: 0.2 10*3/uL (ref 0.1–0.9)
MONO%: 7.3 % (ref 0.0–14.0)
NEUT%: 47.2 % (ref 38.4–76.8)
NEUTROS ABS: 1.6 10*3/uL (ref 1.5–6.5)
Platelets: 201 10*3/uL (ref 145–400)
RBC: 4.27 10*6/uL (ref 3.70–5.45)
RDW: 12.9 % (ref 11.2–14.5)
WBC: 3.4 10*3/uL — AB (ref 3.9–10.3)
lymph#: 1.4 10*3/uL (ref 0.9–3.3)

## 2013-11-20 LAB — COMPREHENSIVE METABOLIC PANEL (CC13)
ALBUMIN: 4.3 g/dL (ref 3.5–5.0)
ALT: 16 U/L (ref 0–55)
ANION GAP: 10 meq/L (ref 3–11)
AST: 23 U/L (ref 5–34)
Alkaline Phosphatase: 43 U/L (ref 40–150)
BUN: 13.4 mg/dL (ref 7.0–26.0)
CALCIUM: 10.2 mg/dL (ref 8.4–10.4)
CHLORIDE: 99 meq/L (ref 98–109)
CO2: 30 meq/L — AB (ref 22–29)
Creatinine: 0.8 mg/dL (ref 0.6–1.1)
GLUCOSE: 94 mg/dL (ref 70–140)
POTASSIUM: 4.4 meq/L (ref 3.5–5.1)
SODIUM: 139 meq/L (ref 136–145)
Total Bilirubin: 0.31 mg/dL (ref 0.20–1.20)
Total Protein: 6.7 g/dL (ref 6.4–8.3)

## 2013-11-20 MED ORDER — HYDROCODONE-ACETAMINOPHEN 5-325 MG PO TABS: ORAL_TABLET | ORAL | Status: DC

## 2013-11-20 NOTE — Telephone Encounter (Signed)
Pt called and wants refill on Hydrocodone.
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
## 2013-11-20 NOTE — Patient Instructions (Signed)
Call if any questions or concerns prior to next visit

## 2013-11-21 ENCOUNTER — Telehealth: Payer: Self-pay | Admitting: *Deleted

## 2013-11-21 NOTE — Telephone Encounter (Signed)
Per 3/25 POF no available to schedule. Copied POF and gave to Hidalgo.

## 2013-11-22 ENCOUNTER — Ambulatory Visit
Admission: RE | Admit: 2013-11-22 | Discharge: 2013-11-22 | Disposition: A | Discharge status: Home / Self Care | Payer: Medicare Other | Source: Ambulatory Visit | Attending: Oncology | Admitting: Oncology

## 2013-11-22 ENCOUNTER — Other Ambulatory Visit: Payer: Self-pay

## 2013-11-22 ENCOUNTER — Ambulatory Visit
Admission: RE | Admit: 2013-11-22 | Discharge: 2013-11-22 | Disposition: A | Discharge status: Home / Self Care | Payer: Medicare Other | Source: Ambulatory Visit

## 2013-11-22 DIAGNOSIS — Z1231 Encounter for screening mammogram for malignant neoplasm of breast: Secondary | ICD-10-CM

## 2013-11-22 DIAGNOSIS — Z853 Personal history of malignant neoplasm of breast: Secondary | ICD-10-CM

## 2013-11-26 ENCOUNTER — Other Ambulatory Visit: Payer: Self-pay | Admitting: Oncology

## 2013-11-26 DIAGNOSIS — R928 Other abnormal and inconclusive findings on diagnostic imaging of breast: Secondary | ICD-10-CM

## 2013-12-02 ENCOUNTER — Other Ambulatory Visit: Payer: Self-pay | Admitting: *Deleted

## 2013-12-02 MED ORDER — PRAVASTATIN SODIUM 40 MG PO TABS: ORAL_TABLET | ORAL | Status: DC

## 2013-12-05 ENCOUNTER — Ambulatory Visit
Admission: RE | Admit: 2013-12-05 | Discharge: 2013-12-05 | Disposition: A | Discharge status: Home / Self Care | Payer: Medicare Other | Source: Ambulatory Visit | Attending: Oncology | Admitting: Oncology

## 2013-12-05 DIAGNOSIS — R928 Other abnormal and inconclusive findings on diagnostic imaging of breast: Secondary | ICD-10-CM

## 2013-12-09 ENCOUNTER — Other Ambulatory Visit: Payer: Self-pay

## 2013-12-09 MED ORDER — CARBAMAZEPINE ER 100 MG PO TB12: 200.0000 mg | ORAL_TABLET | Freq: Two times a day (BID) | ORAL | Status: DC

## 2013-12-23 ENCOUNTER — Other Ambulatory Visit: Payer: Self-pay | Admitting: *Deleted

## 2013-12-23 MED ORDER — HYDROCODONE-ACETAMINOPHEN 5-325 MG PO TABS: ORAL_TABLET | ORAL | Status: DC

## 2014-01-02 ENCOUNTER — Encounter: Payer: Self-pay | Admitting: Neurology

## 2014-01-02 ENCOUNTER — Ambulatory Visit (INDEPENDENT_AMBULATORY_CARE_PROVIDER_SITE_OTHER): Payer: Medicare Other | Admitting: Neurology

## 2014-01-02 ENCOUNTER — Encounter (INDEPENDENT_AMBULATORY_CARE_PROVIDER_SITE_OTHER): Payer: Self-pay

## 2014-01-02 VITALS — Ht 67.5 in | Wt 126.0 lb

## 2014-01-02 DIAGNOSIS — C801 Malignant (primary) neoplasm, unspecified: Secondary | ICD-10-CM

## 2014-01-02 DIAGNOSIS — E079 Disorder of thyroid, unspecified: Secondary | ICD-10-CM

## 2014-01-02 DIAGNOSIS — C50919 Malignant neoplasm of unspecified site of unspecified female breast: Secondary | ICD-10-CM

## 2014-01-02 DIAGNOSIS — J329 Chronic sinusitis, unspecified: Secondary | ICD-10-CM

## 2014-01-02 DIAGNOSIS — Z87440 Personal history of urinary (tract) infections: Secondary | ICD-10-CM

## 2014-01-02 DIAGNOSIS — G500 Trigeminal neuralgia: Secondary | ICD-10-CM

## 2014-01-02 MED ORDER — GABAPENTIN 100 MG PO CAPS: 300.0000 mg | ORAL_CAPSULE | Freq: Three times a day (TID) | ORAL | Status: DC

## 2014-01-02 NOTE — Progress Notes (Signed)
PATIENT: [name] DOB: 1946/07/11  HISTORICAL  [name] is a 68 year old right-handed Caucasian female, alone at today's clinical visit.Last seen in January 2014 by Hoyle Sauer.  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  She presenting with recurrent episode of left facial discomfort, the initial episode was in 2002, following a left upper molar filling procedure, she described left facial fullness,  achy sensation, constant annoying pain,  she was evaluated by [facility] neurologist Dr.Hayworth at that time,  reported normal MRI of the brain, left facial pain last about 6-7 months, resolved by taking tegretol.   The pain was at the left cheek area.  She even underwent left upper molar extraction then.  Since December 2010, she was treated with multiple rounds of antibiotic and prednisone for presumed sinus infection, because of left facial fullness sensation again, annoying pain in the left cheek V2 distribtion, very similar to what she experienced in 2002, no shooting pain, no hypersensitivity, no rash. No left facial weakness, or hearing changes.  She recently had CT of the sinus that was normal, there is no evidence of sinusitis.  MRI brain was essentially normal, there is only mild nonspecific small vessel disease.   She was restarted on tegretol since 2011, which has been very helpful, she is taking carbamazepine XR 100mg , 2 tablet twice a day tabs bid  She is also on polypharmacy treatment, including Fosamax, calcium, vitamin D, Premarin, hydrocodone, Ativan, Pravachol, tramadol, Maxzide. She is also taking, Letrozole  UPDATE May 7th 2015: If she forgot to take tegretol xr 100mg  ii bid, she does noticed recurrent of her left facial pain, she does not have significant Norman effect from the  medications,  Review of Systems  Out of a complete 14 system review, the patient complains of only the following symptoms, and all other reviewed systems are negative.  Hearing loss, ringing ears, back pain, achy muscles, environmental allergies  ALLERGIES: Allergies  Allergen Reactions  . Cefadroxil     REACTION: rash  . Doxycycline Itching  . Simvastatin Other (See Comments)    Aches and memory loss  . Dicloxacillin Rash    HOME MEDICATIONS: Current Outpatient Prescriptions on File Prior to Visit  Medication Sig Dispense Refill  . alendronate (FOSAMAX) 70 MG tablet Take 1 tablet (70 mg total) by mouth every 7 (seven) days. Take with a full glass of water on an empty stomach.  12 tablet  3  . azelastine (ASTELIN) 137 MCG/SPRAY nasal spray Place 2 sprays into the nose Twice daily.      . Calcium 1250 MG TABS Take by mouth.        . calcium carbonate (TUMS EX) 750 MG chewable tablet Chew 1 tablet by mouth daily.      . carbamazepine (TEGRETOL-XR) 100 MG 12 hr tablet Take 2 tablets (200 mg total) by mouth 2 (two) times daily.  360 tablet  0  . cholecalciferol (VITAMIN D) 1000 UNITS tablet Take 1,000 Units by mouth daily.        Marland Kitchen conjugated estrogens (PREMARIN) vaginal cream Place 0.625 g vaginally daily. Apply a pea size amount into vagina every 2-3 days      . fish oil-omega-3 fatty acids 1000 MG capsule Take 2 g by mouth daily.        Marland Kitchen HYDROcodone-acetaminophen (  NORCO/VICODIN) 5-325 MG per tablet TAKE ONE TABLET BY MOUTH EVERY 12 HOURS AS NEEDED FOR PAIN  60 tablet  0  . latanoprost (XALATAN) 0.005 % ophthalmic solution Place 1 drop into both eyes At bedtime.      Marland Kitchen letrozole (FEMARA) 2.5 MG tablet TAKE 1 TABLET (2.5 MG TOTAL) BY MOUTH DAILY.  90 tablet  1  . levocetirizine (XYZAL) 5 MG tablet Take 5 mg by mouth daily.        Marland Kitchen levothyroxine (SYNTHROID, LEVOTHROID) 50 MCG tablet TAKE ONE TABLET BY MOUTH ONCE DAILY  90 tablet  1  . LORazepam (ATIVAN) 1 MG tablet TAKE ONE TABLET BY  MOUTH EVERY DAY AS NEEDED  180 tablet  0  . montelukast (SINGULAIR) 10 MG tablet Take 1 tablet (10 mg total) by mouth at bedtime.  180 tablet  1  . pravastatin (PRAVACHOL) 40 MG tablet TAKE ONE TABLET BY MOUTH EVERY DAY  90 tablet  2  . traMADol (ULTRAM) 50 MG tablet TAKE ONE TABLET BY MOUTH TWICE DAILY AS NEEDED FOR PAIN  360 tablet  0  . triamterene-hydrochlorothiazide (MAXZIDE-25) 37.5-25 MG per tablet TAKE ONE TABLET BY MOUTH ONCE DAILY.  90 tablet  1   No current facility-administered medications on file prior to visit.    PAST MEDICAL HISTORY: Past Medical History  Diagnosis Date  . Hyperlipidemia   . Recurrent sinusitis   . Breast cancer 1999    T1N0 LEFT BREAST  . 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 Osteoporosis 03-04-2011     LAST BONE DENSITY 03-04-2011  . Trigeminal neuralgia   . Degenerative disc disease   . History of bladder infections     RECURRENT  . Thyroid disease     PAST SURGICAL HISTORY: Past Surgical History  Procedure Laterality Date  . Bladder suspension  2005  . Left breast lumpectomy  1999  . Cancerous ln removed  2008  . Breast surgery  1999    Lumpectomy L Br  . Breast surgery  2007    Lymph Node Removal L Chest Wall  . Abdominal hysterectomy  2005    Complete    FAMILY HISTORY: Family History  Problem Relation Age of Onset  . Cancer Mother     breast  . Stroke Mother   . Hyperlipidemia Mother   . Cancer Father     bladder  . Cancer Son     Breast  . Cancer Maternal Aunt     Breast  . Stomach cancer Paternal Uncle   . Cancer Paternal Grandmother     Mouth  . Stomach cancer Paternal Uncle     SOCIAL HISTORY:  History   Social History  . Marital Status: Married    Spouse Name: [name]     Number of Children: 3  . Years of Education: RN   Occupational History  .      Retired- Therapist, sports   Social History Main Topics  . Smoking status: Never Smoker   . Smokeless tobacco: Never Used   . Alcohol Use: No  . Drug Use: No  . Sexual Activity: Not on file     Comment: housewife, married, 3 kids.   Other Topics Concern  . Not on file   Social History Narrative   Patient lives at home with her husband [name])   Retired - RN   Right handed.   Caffeine two cups coffee daily and one sweet tea.  PHYSICAL EXAM   Filed Vitals:   01/02/14 1338  Height: 5' 7.5" (1.715 m)  Weight: 126 lb (57.153 kg)    Not recorded    Body mass index is 19.43 kg/(m^2).   Generalized: In no acute distress  Neck: Supple, no carotid bruits   Cardiac: Regular rate rhythm  Pulmonary: Clear to auscultation bilaterally  Musculoskeletal: No deformity  Neurological examination  Mentation: Alert oriented to time, place, history taking, and causual conversation  Cranial nerve II-XII: Pupils were equal round reactive to light. Extraocular movements were full.  Visual field were full on confrontational test. Bilateral fundi were sharp.  Facial sensation and strength were normal. Hearing was intact to finger rubbing bilaterally. Uvula tongue midline.  Head turning and shoulder shrug and were normal and symmetric.Tongue protrusion into cheek strength was normal.  Motor: Normal tone, bulk and strength.  Sensory: Intact to fine touch, pinprick, preserved vibratory sensation, and proprioception at toes.  Coordination: Normal finger to nose, heel-to-shin bilaterally there was no truncal ataxia  Gait: Rising up from seated position without assistance, normal stance, without trunk ataxia, moderate stride, good arm swing, smooth turning, able to perform tiptoe, and heel walking without difficulty.   Romberg signs: Negative  Deep tendon reflexes: Brachioradialis 2/2, biceps 2/2, triceps 2/2, patellar 2/2, Achilles 2/2, plantar responses were flexor bilaterally.   DIAGNOSTIC DATA (LABS, IMAGING, TESTING) - I reviewed patient records, labs, notes, testing and imaging myself where  available.  Lab Results  Component Value Date   WBC 3.4* 11/20/2013   HGB 13.4 11/20/2013   HCT 40.4 11/20/2013   MCV 94.6 11/20/2013   PLT 201 11/20/2013      Component Value Date/Time   NA 139 11/20/2013 0949   NA 137 11/13/2013 1031   K 4.4 11/20/2013 0949   K 4.0 11/13/2013 1031   CL 98 11/13/2013 1031   CL 98 10/02/2012 1125   CO2 30* 11/20/2013 0949   CO2 33* 11/13/2013 1031   GLUCOSE 94 11/20/2013 0949   GLUCOSE 90 11/13/2013 1031   GLUCOSE 80 10/02/2012 1125   BUN 13.4 11/20/2013 0949   BUN 17 11/13/2013 1031   CREATININE 0.8 11/20/2013 0949   CREATININE 0.68 11/13/2013 1031   CREATININE 0.72 01/11/2012 0922   CALCIUM 10.2 11/20/2013 0949   CALCIUM 10.3 11/13/2013 1031   PROT 6.7 11/20/2013 0949   PROT 6.6 11/13/2013 1031   ALBUMIN 4.3 11/20/2013 0949   ALBUMIN 4.7 11/13/2013 1031   AST 23 11/20/2013 0949   AST 22 11/13/2013 1031   ALT 16 11/20/2013 0949   ALT 15 11/13/2013 1031   ALKPHOS 43 11/20/2013 0949   ALKPHOS 39 11/13/2013 1031   BILITOT 0.31 11/20/2013 0949   BILITOT 0.3 11/13/2013 1031   GFRNONAA >89 11/13/2013 1031   GFRAA >89 11/13/2013 1031   Lab Results  Component Value Date   CHOL 246* 11/13/2013   HDL 99 11/13/2013   LDLCALC 116* 11/13/2013   TRIG 154* 11/13/2013   CHOLHDL 2.5 11/13/2013   No results found for this basename: HGBA1C   No results found for this basename: RCVELFYB01   Lab Results  Component Value Date   TSH 2.666 11/13/2013    ASSESSMENT AND PLAN  Nyima KARRAH MANGINI is a 68 y.o. female With previous history of left trigeminal neuralgia, doing very well over the past few years, has been taking Tegretol XL 100 mg 2 tablets twice a day since 2011, now continue have mild left facial symptoms if she missed  her medications, she also has medical history of osteoporosis, taking Fosamax, previous breast cancer, on polypharmacy treatment,  She is only mild symptomatic, Tegretol is enzyme inducer, no longer a good choice with her polypharmacy treatment, we will try titrating  dose of gabapentin up to 100mg  3 tablets 3 times a day, tapering off Tegretol, return to clinic with Hoyle Sauer in 6 months, if she continues doing well, she may only for loss without clinic as needed, refuel by her primary care.  Marcial Pacas, M.D. Ph.D.  [facility] [address] [place], [place] [address] [phone]

## 2014-01-21 ENCOUNTER — Encounter: Payer: Self-pay | Admitting: Family Medicine

## 2014-01-21 ENCOUNTER — Ambulatory Visit (INDEPENDENT_AMBULATORY_CARE_PROVIDER_SITE_OTHER): Payer: Medicare Other | Admitting: Family Medicine

## 2014-01-21 VITALS — BP 117/65 | HR 104 | Wt 125.0 lb

## 2014-01-21 DIAGNOSIS — K219 Gastro-esophageal reflux disease without esophagitis: Secondary | ICD-10-CM

## 2014-01-21 DIAGNOSIS — S93401A Sprain of unspecified ligament of right ankle, initial encounter: Secondary | ICD-10-CM

## 2014-01-21 DIAGNOSIS — S93409A Sprain of unspecified ligament of unspecified ankle, initial encounter: Secondary | ICD-10-CM

## 2014-01-21 MED ORDER — OMEPRAZOLE 20 MG PO CPDR: 20.0000 mg | DELAYED_RELEASE_CAPSULE | Freq: Every day | ORAL | Status: DC

## 2014-01-21 NOTE — Progress Notes (Signed)
   Subjective:    Patient ID: [name], female    DOB: 1945/12/27, 68 y.o.   MRN: [id number]  HPI + frequent indigestion and belching a lot. Has been using TUMs.  She used to take omeprazole but has been off for awhile  Twisted her ankle walking down the stairs this AM. It was 2 steps.  No treatments like ice or medications. No swelling. Has been able to walk on it but a little sore near the heal. Wanted me to check it.   Her neurologist is weaning her off Tegretol and will be going on gabapentin soon.   Review of Systems     Objective:   Physical Exam  Constitutional: She is oriented to person, place, and time. She appears well-developed and well-nourished.  HENT:  Head: Normocephalic and atraumatic.  Cardiovascular: Normal rate, regular rhythm and normal heart sounds.   Pulmonary/Chest: Effort normal and breath sounds normal.  Musculoskeletal:  Right ankle with normal range of motion. No pain with inversion or eversion of the ankle. Strength is 5 out of 5. Nontender over the metatarsals or the ankle joint itself normal anterior drawer test. No bruising. Some trace swelling over the superior edge of the lateral malleolus..  Neurological: She is alert and oriented to person, place, and time.  Skin: Skin is warm and dry.  Psychiatric: She has a normal mood and affect. Her behavior is normal.          Assessment & Plan:  GERD - will tx with omeprazole.  She's done well with this in the past. She should get some significant symptom review within a week. Consider use TUMS as needed. I recommend treating for 6-8 weeks until complete symptom relief. At that point she can wean off the medication. Continue to follow a reflux diet.  Ankle sprain, mild-recommend and ice elevate. Avoid overuse. Call if not improving.

## 2014-01-21 NOTE — Patient Instructions (Signed)
Omeprazole; Sodium Bicarbonate oral capsule What is this medicine? OMEPRAZOLE; SODIUM BICARBONATE (oh ME pray zol; SOE dee um bye KAR bon ate) prevents the production of acid in the stomach. It is used to treat gastroesophageal reflux disease (GERD), ulcers, and inflammation of the esophagus. This medicine may be used for other purposes; ask your health care provider or pharmacist if you have questions. COMMON BRAND NAME(S): Zegerid What should I tell my health care provider before I take this medicine? They need to know if you have any of these conditions: -Bartter's syndrome -kidney disease -history of low levels of calcium, magnesium, or potassium in the blood -low salt diet -metabolic alkalosis -an unusual reaction to omeprazole, sodium bicarbonate, other medicines, foods, dyes, or preservatives -pregnant or trying to get pregnant -breast-feeding How should I use this medicine? Take this medicine by mouth with a glass of water. Do not take with other drinks or foods. Follow the directions on the prescription label. Do not cut, crush or chew the capsules. Take this medicine on an empty stomach, at least 1 hour before a meal. Take your medicine at regular intervals. Do not take your medicine more often than directed. Do not stop taking except on your doctor's advice. Talk to your pediatrician regarding the use of this medicine in children. Special care may be needed. Overdosage: If you think you have taken too much of this medicine contact a poison control center or emergency room at once. NOTE: This medicine is only for you. Do not share this medicine with others. What if I miss a dose? If you miss a dose, take it as soon as you can. If it is almost time for your next dose, take only that dose. Do not take double or extra doses. What may interact with this medicine? Do not take this medicine with any of the following medications: -atazanavir -clopidogrel -nelfinavir This medicine may  also interact with the following medications: -antibiotics like ampicillin, clarithromycin, tetracycline -antifungals like itraconazole, ketoconazole, and voriconazole -antivirals like delavirdine, efavirenz, indinavir, saquinavir -certain medicines that treat or prevent blood clots like warfarin -cyclosporine -dasatinib -digoxin -disulfiram -diuretics -iron supplements -medicines for anxiety, panic, and sleep like diazepam -medicines for seizures like carbamazepine, phenobarbital, phenytoin -other medicines for stomach acid -tacrolimus -tolmetin -vitamin B12 This list may not describe all possible interactions. Give your health care provider a list of all the medicines, herbs, non-prescription drugs, or dietary supplements you use. Also tell them if you smoke, drink alcohol, or use illegal drugs. Some items may interact with your medicine. What should I watch for while using this medicine? Visit your doctor for regular check ups. Tell your doctor if your symptoms do not get better or if they get worse. Do not treat diarrhea with over the counter products. Contact your doctor if you have diarrhea that lasts more than 2 days or if it is severe and watery. You may need blood work done while you are taking this medicine. What side effects may I notice from receiving this medicine? Side effects that you should report to your doctor or health care professional as soon as possible: -allergic reactions like skin rash, itching or hives, swelling of the face, lips, or tongue -bone, muscle or joint pain -breathing problems -chest pain -dark urine -diarrhea -dizziness -fast, irregular heartbeat -feeling faint or lightheaded, falls -fever or sore throat -high blood pressure -palpitations -muscle spasms -redness, blistering, peeling or loosening of the skin, including inside the mouth -seizures -stomach pain -swelling of the ankles,  legs -tremors -trouble passing urine or change in the  amount of urine -unusual bleeding, bruising -unusually weak or tired -yellowing of the eyes or skin Side effects that usually do not require medical attention (report to your doctor or health care professional if they continue or are bothersome): -constipation -dry mouth -headache -loose stools -nausea This list may not describe all possible side effects. Call your doctor for medical advice about side effects. You may report side effects to FDA at 1-800-FDA-1088. Where should I keep my medicine? Keep out of the reach of children. Store at room temperature between 15 and 30 degrees C (59 and 86 degrees F). Protect from moisture. Throw away any unused medicine after the expiration date. NOTE: This sheet is a summary. It may not cover all possible information. If you have questions about this medicine, talk to your doctor, pharmacist, or health care provider.  2014, Elsevier/Gold Standard. (2011-05-23 14:37:02)

## 2014-01-28 ENCOUNTER — Other Ambulatory Visit: Payer: Self-pay | Admitting: *Deleted

## 2014-01-28 MED ORDER — HYDROCODONE-ACETAMINOPHEN 5-325 MG PO TABS: ORAL_TABLET | ORAL | Status: DC

## 2014-01-28 NOTE — Telephone Encounter (Signed)
Pt informed.[name] L Melady Chow  

## 2014-02-03 ENCOUNTER — Other Ambulatory Visit: Payer: Self-pay | Admitting: *Deleted

## 2014-02-03 MED ORDER — TRIAMTERENE-HCTZ 37.5-25 MG PO TABS: ORAL_TABLET | ORAL | Status: DC

## 2014-02-04 ENCOUNTER — Telehealth: Payer: Self-pay

## 2014-02-04 NOTE — Telephone Encounter (Signed)
Per LC, let pt know bone density reports stable.  Pt to continue fosamax.  Pt voiced understanding.

## 2014-02-19 ENCOUNTER — Other Ambulatory Visit: Payer: Self-pay | Admitting: *Deleted

## 2014-02-19 MED ORDER — ALENDRONATE SODIUM 70 MG PO TABS: 70.0000 mg | ORAL_TABLET | ORAL | Status: DC

## 2014-02-25 ENCOUNTER — Other Ambulatory Visit: Payer: Self-pay | Admitting: *Deleted

## 2014-02-25 MED ORDER — LORAZEPAM 1 MG PO TABS: ORAL_TABLET | ORAL | Status: DC

## 2014-03-05 ENCOUNTER — Other Ambulatory Visit: Payer: Self-pay | Admitting: *Deleted

## 2014-03-05 MED ORDER — HYDROCODONE-ACETAMINOPHEN 5-325 MG PO TABS: ORAL_TABLET | ORAL | Status: DC

## 2014-03-06 ENCOUNTER — Telehealth: Payer: Self-pay | Admitting: Oncology

## 2014-03-06 NOTE — Telephone Encounter (Signed)
pt called  to sched appt...done....pt ok and aware of new d.t °

## 2014-03-10 ENCOUNTER — Other Ambulatory Visit: Payer: Self-pay | Admitting: *Deleted

## 2014-03-10 DIAGNOSIS — C50912 Malignant neoplasm of unspecified site of left female breast: Secondary | ICD-10-CM

## 2014-03-10 MED ORDER — LETROZOLE 2.5 MG PO TABS: ORAL_TABLET | ORAL | Status: DC

## 2014-04-07 ENCOUNTER — Other Ambulatory Visit: Payer: Self-pay | Admitting: *Deleted

## 2014-04-07 ENCOUNTER — Telehealth: Payer: Self-pay | Admitting: *Deleted

## 2014-04-07 MED ORDER — HYDROCODONE-ACETAMINOPHEN 5-325 MG PO TABS: ORAL_TABLET | ORAL | Status: DC

## 2014-04-07 NOTE — Telephone Encounter (Signed)
Patient notified of script to be picked up. Margette Fast, CMA

## 2014-04-16 ENCOUNTER — Telehealth: Payer: Self-pay | Admitting: Neurology

## 2014-04-16 NOTE — Telephone Encounter (Signed)
Patient calling to state that she feels like her Gabapentin is causing her to "not think straight" or to remember things as quick as she should. Please return call to patient and advise.

## 2014-04-16 NOTE — Telephone Encounter (Signed)
Patient calling back to state that she would like to be called at her house number which is [phone].

## 2014-04-18 MED ORDER — CARBAMAZEPINE ER 100 MG PO TB12: 200.0000 mg | ORAL_TABLET | Freq: Two times a day (BID) | ORAL | Status: DC

## 2014-04-18 MED ORDER — CARBAMAZEPINE ER 100 MG PO CP12: 200.0000 mg | ORAL_CAPSULE | Freq: Two times a day (BID) | ORAL | Status: DC

## 2014-04-18 NOTE — Telephone Encounter (Signed)
Spoke to patient and she would like to go back on the Tegretol because it worked well and she had no pain.  The Gabapentin is making her sleepy and she is not working as well.  Please advise.

## 2014-04-18 NOTE — Telephone Encounter (Signed)
Patient calling again to states that her Gabapentin is making her sleepy and is not working, patient would like to go back on Tegretol and have that called in to the [facility] in [place], their number is [phone].

## 2014-04-18 NOTE — Telephone Encounter (Signed)
I have called her, neurontin 100mg  tid made her sleepy, also has break through pain.   I started her back on tegretol xr 100mg  ii po bid. Rx was written

## 2014-04-21 ENCOUNTER — Encounter: Payer: Self-pay | Admitting: Family Medicine

## 2014-04-21 ENCOUNTER — Ambulatory Visit (INDEPENDENT_AMBULATORY_CARE_PROVIDER_SITE_OTHER): Payer: Medicare Other | Admitting: Family Medicine

## 2014-04-21 VITALS — BP 121/71 | HR 98 | Wt 123.0 lb

## 2014-04-21 DIAGNOSIS — E039 Hypothyroidism, unspecified: Secondary | ICD-10-CM

## 2014-04-21 DIAGNOSIS — L299 Pruritus, unspecified: Secondary | ICD-10-CM

## 2014-04-21 DIAGNOSIS — R5383 Other fatigue: Principal | ICD-10-CM

## 2014-04-21 DIAGNOSIS — Z23 Encounter for immunization: Secondary | ICD-10-CM

## 2014-04-21 DIAGNOSIS — R5381 Other malaise: Secondary | ICD-10-CM

## 2014-04-21 NOTE — Progress Notes (Signed)
   Subjective:    Patient ID: [name], female    DOB: Jul 14, 1946, 68 y.o.   MRN: [id number]  HPI She's here today because she's not been feeling well lately. Her neurologist recently discontinued her Tegretol and switch her to gabapentin. Though she recently switched back to the Tegretol and has actually been feeling a little bit better. Restarted her tegretol x 3 days.    She's also had some itchy scalp. Her granddaughter has had a couple of bouts of lice and she is worried whether not she may have it. And a zip lock bag today with some specimens. She denies any new hair products or sprays et Ronney Asters. She says the itching is mostly just at night it doesn't really bother her at all during the daytime.  Review of Systems     Objective:   Physical Exam  Constitutional: She is oriented to person, place, and time. She appears well-developed and well-nourished.  HENT:  Head: Normocephalic and atraumatic.  Neck: No thyromegaly present.  Cardiovascular: Normal rate, regular rhythm and normal heart sounds.   Pulmonary/Chest: Effort normal and breath sounds normal.  Lymphadenopathy:    She has no cervical adenopathy.  Neurological: She is alert and oriented to person, place, and time.  Skin: Skin is warm and dry.  Psychiatric: She has a normal mood and affect. Her behavior is normal.          Assessment & Plan:  Given flu shot today.    Fatigue - she is actually already feeling better since switching off of the gabapentin. I think that was the most likely cause of her fatigue.  Hypothyroid - Due to recheck thyorid in one month. Lab slip given today. She can go anytime her convenience.  Itching scalp-I. did exam and one of her hairs underneath microscope and only solitary can plug. No sign of lice. To make sure that she's moisturizing her scalp well and avoiding high heat such as hair dryer etc. on the scalp.

## 2014-04-22 LAB — TSH: TSH: 1.288 u[IU]/mL (ref 0.350–4.500)

## 2014-04-29 ENCOUNTER — Other Ambulatory Visit: Payer: Self-pay | Admitting: *Deleted

## 2014-04-29 MED ORDER — LEVOTHYROXINE SODIUM 50 MCG PO TABS: ORAL_TABLET | ORAL | Status: DC

## 2014-05-08 ENCOUNTER — Other Ambulatory Visit: Payer: Self-pay

## 2014-05-08 MED ORDER — HYDROCODONE-ACETAMINOPHEN 5-325 MG PO TABS: ORAL_TABLET | ORAL | Status: DC

## 2014-05-18 ENCOUNTER — Other Ambulatory Visit: Payer: Self-pay | Admitting: Oncology

## 2014-05-21 ENCOUNTER — Encounter: Payer: Self-pay | Admitting: Oncology

## 2014-05-21 ENCOUNTER — Telehealth: Payer: Self-pay | Admitting: Family Medicine

## 2014-05-21 ENCOUNTER — Telehealth: Payer: Self-pay | Admitting: Oncology

## 2014-05-21 ENCOUNTER — Other Ambulatory Visit (HOSPITAL_BASED_OUTPATIENT_CLINIC_OR_DEPARTMENT_OTHER): Payer: Medicare Other

## 2014-05-21 ENCOUNTER — Telehealth: Payer: Self-pay | Admitting: *Deleted

## 2014-05-21 ENCOUNTER — Ambulatory Visit (HOSPITAL_BASED_OUTPATIENT_CLINIC_OR_DEPARTMENT_OTHER): Payer: Medicare Other | Admitting: Oncology

## 2014-05-21 ENCOUNTER — Other Ambulatory Visit: Payer: Self-pay | Admitting: *Deleted

## 2014-05-21 VITALS — BP 130/78 | HR 99 | Temp 98.1°F | Resp 18 | Ht 67.0 in | Wt 122.5 lb

## 2014-05-21 DIAGNOSIS — Z1231 Encounter for screening mammogram for malignant neoplasm of breast: Secondary | ICD-10-CM

## 2014-05-21 DIAGNOSIS — C50919 Malignant neoplasm of unspecified site of unspecified female breast: Secondary | ICD-10-CM

## 2014-05-21 DIAGNOSIS — T386X5A Adverse effect of antigonadotrophins, antiestrogens, antiandrogens, not elsewhere classified, initial encounter: Secondary | ICD-10-CM

## 2014-05-21 DIAGNOSIS — Z853 Personal history of malignant neoplasm of breast: Secondary | ICD-10-CM

## 2014-05-21 DIAGNOSIS — M810 Age-related osteoporosis without current pathological fracture: Secondary | ICD-10-CM

## 2014-05-21 DIAGNOSIS — M818 Other osteoporosis without current pathological fracture: Secondary | ICD-10-CM

## 2014-05-21 LAB — CBC WITH DIFFERENTIAL/PLATELET
BASO%: 0.4 % (ref 0.0–2.0)
Basophils Absolute: 0 10*3/uL (ref 0.0–0.1)
EOS%: 1 % (ref 0.0–7.0)
Eosinophils Absolute: 0 10*3/uL (ref 0.0–0.5)
HEMATOCRIT: 39.6 % (ref 34.8–46.6)
HGB: 13 g/dL (ref 11.6–15.9)
LYMPH%: 25 % (ref 14.0–49.7)
MCH: 30.8 pg (ref 25.1–34.0)
MCHC: 33 g/dL (ref 31.5–36.0)
MCV: 93.4 fL (ref 79.5–101.0)
MONO#: 0.3 10*3/uL (ref 0.1–0.9)
MONO%: 6.6 % (ref 0.0–14.0)
NEUT#: 2.9 10*3/uL (ref 1.5–6.5)
NEUT%: 67 % (ref 38.4–76.8)
PLATELETS: 216 10*3/uL (ref 145–400)
RBC: 4.24 10*6/uL (ref 3.70–5.45)
RDW: 12.9 % (ref 11.2–14.5)
WBC: 4.4 10*3/uL (ref 3.9–10.3)
lymph#: 1.1 10*3/uL (ref 0.9–3.3)

## 2014-05-21 LAB — COMPREHENSIVE METABOLIC PANEL (CC13)
ALK PHOS: 43 U/L (ref 40–150)
ALT: 10 U/L (ref 0–55)
AST: 18 U/L (ref 5–34)
Albumin: 4.1 g/dL (ref 3.5–5.0)
Anion Gap: 8 mEq/L (ref 3–11)
BILIRUBIN TOTAL: 0.25 mg/dL (ref 0.20–1.20)
BUN: 18.3 mg/dL (ref 7.0–26.0)
CO2: 33 mEq/L — ABNORMAL HIGH (ref 22–29)
Calcium: 10.3 mg/dL (ref 8.4–10.4)
Chloride: 104 mEq/L (ref 98–109)
Creatinine: 0.9 mg/dL (ref 0.6–1.1)
Glucose: 90 mg/dl (ref 70–140)
Potassium: 3.7 mEq/L (ref 3.5–5.1)
SODIUM: 144 meq/L (ref 136–145)
TOTAL PROTEIN: 6.7 g/dL (ref 6.4–8.3)

## 2014-05-21 MED ORDER — OMEPRAZOLE 20 MG PO CPDR: DELAYED_RELEASE_CAPSULE | ORAL | Status: DC

## 2014-05-21 NOTE — Telephone Encounter (Signed)
Pt called and would like her omeprazole to be increased to BID new rx sent .Audelia Hives [place]

## 2014-05-21 NOTE — Telephone Encounter (Signed)
Patient called and left message confirming that she is taking fosamax every 7 days as prescribed. Passed this along to Dr. Marko Plume.

## 2014-05-21 NOTE — Telephone Encounter (Signed)
per pof to sch pt appt w/LL in 10/2014-Ll sch not open-gave to Kathrine Cords

## 2014-05-21 NOTE — Telephone Encounter (Signed)
per pof to sch pt appt-sch pt mamma-BC stated dexa cant be done because just had 10/2013-pt understood-sent [facility] email

## 2014-05-21 NOTE — Patient Instructions (Signed)
Denosumab injection What is this medicine? DENOSUMAB (den oh sue mab) slows bone breakdown. Prolia is used to treat osteoporosis in women after menopause and in men. Xgeva is used to prevent bone fractures and other bone problems caused by cancer bone metastases. Xgeva is also used to treat giant cell tumor of the bone. This medicine may be used for other purposes; ask your health care provider or pharmacist if you have questions. COMMON BRAND NAME(S): Prolia, XGEVA What should I tell my health care provider before I take this medicine? They need to know if you have any of these conditions: -dental disease -eczema -infection or history of infections -kidney disease or on dialysis -low blood calcium or vitamin D -malabsorption syndrome -scheduled to have surgery or tooth extraction -taking medicine that contains denosumab -thyroid or parathyroid disease -an unusual reaction to denosumab, other medicines, foods, dyes, or preservatives -pregnant or trying to get pregnant -breast-feeding How should I use this medicine? This medicine is for injection under the skin. It is given by a health care professional in a hospital or clinic setting. If you are getting Prolia, a special MedGuide will be given to you by the pharmacist with each prescription and refill. Be sure to read this information carefully each time. For Prolia, talk to your pediatrician regarding the use of this medicine in children. Special care may be needed. For Xgeva, talk to your pediatrician regarding the use of this medicine in children. While this drug may be prescribed for children as young as 13 years for selected conditions, precautions do apply. Overdosage: If you think you've taken too much of this medicine contact a poison control center or emergency room at once. Overdosage: If you think you have taken too much of this medicine contact a poison control center or emergency room at once. NOTE: This medicine is only for  you. Do not share this medicine with others. What if I miss a dose? It is important not to miss your dose. Call your doctor or health care professional if you are unable to keep an appointment. What may interact with this medicine? Do not take this medicine with any of the following medications: -other medicines containing denosumab This medicine may also interact with the following medications: -medicines that suppress the immune system -medicines that treat cancer -steroid medicines like prednisone or cortisone This list may not describe all possible interactions. Give your health care provider a list of all the medicines, herbs, non-prescription drugs, or dietary supplements you use. Also tell them if you smoke, drink alcohol, or use illegal drugs. Some items may interact with your medicine. What should I watch for while using this medicine? Visit your doctor or health care professional for regular checks on your progress. Your doctor or health care professional may order blood tests and other tests to see how you are doing. Call your doctor or health care professional if you get a cold or other infection while receiving this medicine. Do not treat yourself. This medicine may decrease your body's ability to fight infection. You should make sure you get enough calcium and vitamin D while you are taking this medicine, unless your doctor tells you not to. Discuss the foods you eat and the vitamins you take with your health care professional. See your dentist regularly. Brush and floss your teeth as directed. Before you have any dental work done, tell your dentist you are receiving this medicine. Do not become pregnant while taking this medicine or for 5 months after stopping   it. Women should inform their doctor if they wish to become pregnant or think they might be pregnant. There is a potential for serious side effects to an unborn child. Talk to your health care professional or pharmacist for more  information. What side effects may I notice from receiving this medicine? Side effects that you should report to your doctor or health care professional as soon as possible: -allergic reactions like skin rash, itching or hives, swelling of the face, lips, or tongue -breathing problems -chest pain -fast, irregular heartbeat -feeling faint or lightheaded, falls -fever, chills, or any other sign of infection -muscle spasms, tightening, or twitches -numbness or tingling -skin blisters or bumps, or is dry, peels, or red -slow healing or unexplained pain in the mouth or jaw -unusual bleeding or bruising Side effects that usually do not require medical attention (Report these to your doctor or health care professional if they continue or are bothersome.): -muscle pain -stomach upset, gas This list may not describe all possible side effects. Call your doctor for medical advice about side effects. You may report side effects to FDA at 1-800-FDA-1088. Where should I keep my medicine? This medicine is only given in a clinic, doctor's office, or other health care setting and will not be stored at home. NOTE: This sheet is a summary. It may not cover all possible information. If you have questions about this medicine, talk to your doctor, pharmacist, or health care provider.  2015, Elsevier/Gold Standard. (2012-02-13 12:37:47)  

## 2014-05-21 NOTE — Telephone Encounter (Signed)
Left message for patient to return call.

## 2014-05-21 NOTE — Telephone Encounter (Signed)
Please call patient: Blood work for complete metabolic panel looked great. Kidney function and liver function are normal.
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
## 2014-05-22 NOTE — Telephone Encounter (Signed)
Patient advised of results.

## 2014-05-23 ENCOUNTER — Other Ambulatory Visit: Payer: Self-pay | Admitting: Oncology

## 2014-06-10 ENCOUNTER — Other Ambulatory Visit: Payer: Self-pay | Admitting: *Deleted

## 2014-06-10 MED ORDER — HYDROCODONE-ACETAMINOPHEN 5-325 MG PO TABS: ORAL_TABLET | ORAL | Status: DC

## 2014-06-14 ENCOUNTER — Telehealth: Payer: Self-pay | Admitting: Oncology

## 2014-06-14 NOTE — Telephone Encounter (Signed)
, °

## 2014-06-25 ENCOUNTER — Ambulatory Visit: Payer: Medicare Other

## 2014-06-26 ENCOUNTER — Encounter: Payer: Self-pay | Admitting: Family Medicine

## 2014-06-26 ENCOUNTER — Ambulatory Visit (INDEPENDENT_AMBULATORY_CARE_PROVIDER_SITE_OTHER): Payer: Medicare Other | Admitting: Family Medicine

## 2014-06-26 VITALS — BP 121/71 | HR 100 | Temp 98.1°F | Wt 123.0 lb

## 2014-06-26 DIAGNOSIS — K219 Gastro-esophageal reflux disease without esophagitis: Secondary | ICD-10-CM

## 2014-06-26 DIAGNOSIS — M810 Age-related osteoporosis without current pathological fracture: Secondary | ICD-10-CM

## 2014-06-26 NOTE — Patient Instructions (Signed)
Denosumab injection What is this medicine? DENOSUMAB (den oh sue mab) slows bone breakdown. Prolia is used to treat osteoporosis in women after menopause and in men. Xgeva is used to prevent bone fractures and other bone problems caused by cancer bone metastases. Xgeva is also used to treat giant cell tumor of the bone. This medicine may be used for other purposes; ask your health care provider or pharmacist if you have questions. COMMON BRAND NAME(S): Prolia, XGEVA What should I tell my health care provider before I take this medicine? They need to know if you have any of these conditions: -dental disease -eczema -infection or history of infections -kidney disease or on dialysis -low blood calcium or vitamin D -malabsorption syndrome -scheduled to have surgery or tooth extraction -taking medicine that contains denosumab -thyroid or parathyroid disease -an unusual reaction to denosumab, other medicines, foods, dyes, or preservatives -pregnant or trying to get pregnant -breast-feeding How should I use this medicine? This medicine is for injection under the skin. It is given by a health care professional in a hospital or clinic setting. If you are getting Prolia, a special MedGuide will be given to you by the pharmacist with each prescription and refill. Be sure to read this information carefully each time. For Prolia, talk to your pediatrician regarding the use of this medicine in children. Special care may be needed. For Xgeva, talk to your pediatrician regarding the use of this medicine in children. While this drug may be prescribed for children as young as 13 years for selected conditions, precautions do apply. Overdosage: If you think you've taken too much of this medicine contact a poison control center or emergency room at once. Overdosage: If you think you have taken too much of this medicine contact a poison control center or emergency room at once. NOTE: This medicine is only for  you. Do not share this medicine with others. What if I miss a dose? It is important not to miss your dose. Call your doctor or health care professional if you are unable to keep an appointment. What may interact with this medicine? Do not take this medicine with any of the following medications: -other medicines containing denosumab This medicine may also interact with the following medications: -medicines that suppress the immune system -medicines that treat cancer -steroid medicines like prednisone or cortisone This list may not describe all possible interactions. Give your health care provider a list of all the medicines, herbs, non-prescription drugs, or dietary supplements you use. Also tell them if you smoke, drink alcohol, or use illegal drugs. Some items may interact with your medicine. What should I watch for while using this medicine? Visit your doctor or health care professional for regular checks on your progress. Your doctor or health care professional may order blood tests and other tests to see how you are doing. Call your doctor or health care professional if you get a cold or other infection while receiving this medicine. Do not treat yourself. This medicine may decrease your body's ability to fight infection. You should make sure you get enough calcium and vitamin D while you are taking this medicine, unless your doctor tells you not to. Discuss the foods you eat and the vitamins you take with your health care professional. See your dentist regularly. Brush and floss your teeth as directed. Before you have any dental work done, tell your dentist you are receiving this medicine. Do not become pregnant while taking this medicine or for 5 months after stopping   it. Women should inform their doctor if they wish to become pregnant or think they might be pregnant. There is a potential for serious side effects to an unborn child. Talk to your health care professional or pharmacist for more  information. What side effects may I notice from receiving this medicine? Side effects that you should report to your doctor or health care professional as soon as possible: -allergic reactions like skin rash, itching or hives, swelling of the face, lips, or tongue -breathing problems -chest pain -fast, irregular heartbeat -feeling faint or lightheaded, falls -fever, chills, or any other sign of infection -muscle spasms, tightening, or twitches -numbness or tingling -skin blisters or bumps, or is dry, peels, or red -slow healing or unexplained pain in the mouth or jaw -unusual bleeding or bruising Side effects that usually do not require medical attention (Report these to your doctor or health care professional if they continue or are bothersome.): -muscle pain -stomach upset, gas This list may not describe all possible side effects. Call your doctor for medical advice about side effects. You may report side effects to FDA at 1-800-FDA-1088. Where should I keep my medicine? This medicine is only given in a clinic, doctor's office, or other health care setting and will not be stored at home. NOTE: This sheet is a summary. It may not cover all possible information. If you have questions about this medicine, talk to your doctor, pharmacist, or health care provider.  2015, Elsevier/Gold Standard. (2012-02-13 12:37:47)  

## 2014-06-26 NOTE — Progress Notes (Signed)
   Subjective:    Patient ID: [name], female    DOB: 1946-03-13, 68 y.o.   MRN: [id number]  Gastrophageal Reflux   GERD - feels like her symptoms are not currently well controlled. Was traveling out of town for 2 weeks and says was eating out a lot and has been burping a lot and her reflux flared up and since has been back home it has been a little better.  Has had more stomach pain than usually.  That feels better now and thinks may have pulled a muscle.   Osteoporosis - she is interested in possibly switching to Prolia. M times she forgets her Fosamax and he does have severe reflux which gets aggravated very easily. Review of Systems     Objective:   Physical Exam  Constitutional: She is oriented to person, place, and time. She appears well-developed and well-nourished.  HENT:  Head: Normocephalic and atraumatic.  Cardiovascular: Normal rate, regular rhythm and normal heart sounds.   Pulmonary/Chest: Effort normal and breath sounds normal.  Abdominal: Soft. Bowel sounds are normal. She exhibits no distension and no mass. There is no tenderness. There is no rebound and no guarding.  Neurological: She is alert and oriented to person, place, and time.  Skin: Skin is warm and dry.  Psychiatric: She has a normal mood and affect. Her behavior is normal.          Assessment & Plan:  GERD recommend increase omeprazole to twice a day for the next 3-4 weeks to try to her symptoms under really good control. We reviewed dietary measures and recommend avoiding foods that can be triggers. If she is not improving and please let me know. Otherwise she does well after a few weeks then go back down to once a day dosing.   Osteoporosis-we could certainly switch to Prolia. I think she would be a good candidate. Will check with her insurance coverage and let her know. Discussed how this medication is administered and additional information provided.

## 2014-07-02 ENCOUNTER — Telehealth: Payer: Self-pay | Admitting: *Deleted

## 2014-07-02 NOTE — Telephone Encounter (Signed)
No prior auth required as per Colletta [place] B @ [employer] for prolia. Can use buy and bill protocol. Margette Fast, CMA

## 2014-07-02 NOTE — Telephone Encounter (Signed)
Okay to move forward with placing order from the pharmacy and checking CMP. Please verify patient taking calcium with vitamin D.

## 2014-07-05 ENCOUNTER — Other Ambulatory Visit: Payer: Self-pay | Admitting: Family Medicine

## 2014-07-09 ENCOUNTER — Other Ambulatory Visit: Payer: Self-pay

## 2014-07-09 MED ORDER — PRAVASTATIN SODIUM 40 MG PO TABS: ORAL_TABLET | ORAL | Status: DC

## 2014-07-09 MED ORDER — TRIAMTERENE-HCTZ 37.5-25 MG PO TABS: ORAL_TABLET | ORAL | Status: DC

## 2014-07-10 ENCOUNTER — Other Ambulatory Visit: Payer: Self-pay | Admitting: *Deleted

## 2014-07-10 MED ORDER — TRIAMTERENE-HCTZ 37.5-25 MG PO TABS
ORAL_TABLET | ORAL | Status: DC
Start: 2014-07-10 — End: 2015-09-03

## 2014-07-10 NOTE — Telephone Encounter (Signed)
Second attempt made today to contact patient to schedule Prolia injection and have her get blood work prior to injection. Margette Fast, CMA

## 2014-07-14 NOTE — Telephone Encounter (Signed)
Patient called and left a message on nurse line asking for a return call.   Returned Call: Left message asking patient to call back.  

## 2014-07-16 ENCOUNTER — Other Ambulatory Visit: Payer: Self-pay | Admitting: *Deleted

## 2014-07-16 ENCOUNTER — Ambulatory Visit: Payer: Self-pay | Admitting: Adult Health

## 2014-07-16 DIAGNOSIS — T386X5A Adverse effect of antigonadotrophins, antiestrogens, antiandrogens, not elsewhere classified, initial encounter: Principal | ICD-10-CM

## 2014-07-16 DIAGNOSIS — M818 Other osteoporosis without current pathological fracture: Secondary | ICD-10-CM

## 2014-07-16 DIAGNOSIS — T386X5S Adverse effect of antigonadotrophins, antiestrogens, antiandrogens, not elsewhere classified, sequela: Secondary | ICD-10-CM

## 2014-07-16 MED ORDER — HYDROCODONE-ACETAMINOPHEN 5-325 MG PO TABS: ORAL_TABLET | ORAL | Status: DC

## 2014-07-16 NOTE — Telephone Encounter (Signed)
I spoke with [name] today and she is going to pick up lab order slip and then after results rec'd prolia can be ordered to be given in office. Margette Fast, CMA

## 2014-07-17 LAB — COMPREHENSIVE METABOLIC PANEL
ALBUMIN: 4.7 g/dL (ref 3.5–5.2)
ALK PHOS: 40 U/L (ref 39–117)
ALT: 16 U/L (ref 0–35)
AST: 22 U/L (ref 0–37)
BUN: 20 mg/dL (ref 6–23)
CO2: 31 mEq/L (ref 19–32)
Calcium: 9.7 mg/dL (ref 8.4–10.5)
Chloride: 100 mEq/L (ref 96–112)
Creat: 0.78 mg/dL (ref 0.50–1.10)
GLUCOSE: 78 mg/dL (ref 70–99)
POTASSIUM: 4.2 meq/L (ref 3.5–5.3)
SODIUM: 141 meq/L (ref 135–145)
TOTAL PROTEIN: 6.6 g/dL (ref 6.0–8.3)
Total Bilirubin: 0.4 mg/dL (ref 0.2–1.2)

## 2014-07-18 ENCOUNTER — Ambulatory Visit (INDEPENDENT_AMBULATORY_CARE_PROVIDER_SITE_OTHER): Payer: Medicare Other | Admitting: Adult Health

## 2014-07-18 ENCOUNTER — Encounter: Payer: Self-pay | Admitting: Adult Health

## 2014-07-18 VITALS — BP 114/68 | HR 69 | Ht 67.0 in | Wt 129.0 lb

## 2014-07-18 DIAGNOSIS — G500 Trigeminal neuralgia: Secondary | ICD-10-CM

## 2014-07-18 NOTE — Progress Notes (Signed)
Quick Note:  All labs are normal. ______ 

## 2014-07-18 NOTE — Progress Notes (Signed)
PATIENT: [name] DOB: [birth year]  REASON FOR VISIT: follow up HISTORY FROM: patient  HISTORY OF PRESENT ILLNESS: [name] is a 68 year old female with a history of trigeminal neuralgia. She returns today for follow-up. She is currently taking Tegretol XR 200 mg BID. She reports that this has been beneficial. Denies any facial pain. No new neurological complaints. No new medical history.  HISTORY 01/02/14 [facility]): [name] is a 68 year old right-handed Caucasian female, alone at today's clinical visit.Last seen in January 2014 by Hoyle Sauer. 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 She presenting with recurrent episode of left facial discomfort, the initial episode was in 2002, following a left upper molar filling procedure, she described left facial fullness,  achy sensation, constant annoying pain,  she was evaluated by [facility] neurologist Dr.Hayworth at that time,  reported normal MRI of the brain, left facial pain last about 6-7 months, resolved by taking tegretol.   The pain was at the left cheek area.  She even underwent left upper molar extraction then. Since December 2010, she was treated with multiple rounds of antibiotic and prednisone for presumed sinus infection, because of left facial fullness sensation again, annoying pain in the left cheek V2 distribtion, very similar to what she experienced in 2002, no shooting pain, no hypersensitivity, no rash. No left facial weakness, or hearing changes. She recently had CT of the sinus that was normal, there is no evidence of sinusitis. MRI brain was essentially normal, there is only mild nonspecific small vessel disease. She was restarted on tegretol since 2011, which has been very helpful, she is taking carbamazepine XR 100mg , 2 tablet twice a day tabs bid, She is  also on polypharmacy treatment, including Fosamax, calcium, vitamin D, Premarin, hydrocodone, Ativan, Pravachol, tramadol, Maxzide. She is also taking, LetrozoleUPDATE May 7th 2015:If she forgot to take tegretol xr 100mg  ii bid, she does noticed recurrent of her left facial pain, she does not have significant side effect from the medications,   REVIEW OF SYSTEMS: Out of a complete 14 system review of symptoms, the patient complains only of the following symptoms, and all other reviewed systems are negative.    ALLERGIES: Allergies  Allergen Reactions  . Cefadroxil     REACTION: rash  . Doxycycline Itching  . Simvastatin Other (See Comments)    Aches and memory loss  . Dicloxacillin Rash    HOME MEDICATIONS: Outpatient Prescriptions Prior to Visit  Medication Sig Dispense Refill  . alendronate (FOSAMAX) 70 MG tablet Take 1 tablet (70 mg total) by mouth every 7 (seven) days. Take with a full glass of water on an empty stomach. 12 tablet 3  . azelastine (ASTELIN) 137 MCG/SPRAY nasal spray Place 2 sprays into the nose Twice daily.    . calcium carbonate (TUMS EX) 750 MG chewable tablet Chew 1 tablet by mouth daily.    . Calcium-Magnesium-Vitamin D (CALCIUM 1200+D3 PO) Take by mouth daily.     . carbamazepine (TEGRETOL-XR) 100 MG 12 hr tablet Take 2 tablets (200 mg total) by mouth 2 (two) times daily. 360 tablet 3  . conjugated estrogens (PREMARIN) vaginal cream Place 0.625 g vaginally daily. Apply a pea size amount into vagina every 2-3 days    . fish oil-omega-3 fatty acids 1000 MG capsule Take 2 g by mouth  daily.      Marland Kitchen HYDROcodone-acetaminophen (NORCO/VICODIN) 5-325 MG per tablet TAKE ONE TABLET BY MOUTH EVERY 12 HOURS AS NEEDED FOR PAIN 60 tablet 0  . latanoprost (XALATAN) 0.005 % ophthalmic solution Place 1 drop into both eyes At bedtime.    Marland Kitchen letrozole (FEMARA) 2.5 MG tablet TAKE 1 TABLET (2.5 MG TOTAL) BY MOUTH DAILY. 90 tablet 1  . levocetirizine (XYZAL) 5 MG tablet Take 5 mg by  mouth daily.      Marland Kitchen levothyroxine (SYNTHROID, LEVOTHROID) 50 MCG tablet TAKE ONE TABLET BY MOUTH ONCE DAILY 90 tablet 1  . LORazepam (ATIVAN) 1 MG tablet TAKE ONE TABLET BY MOUTH EVERY DAY AS NEEDED 180 tablet 0  . montelukast (SINGULAIR) 10 MG tablet Take 1 tablet (10 mg total) by mouth at bedtime. 180 tablet 1  . nitrofurantoin (MACRODANTIN) 100 MG capsule Take 100 mg by mouth at bedtime.     . pravastatin (PRAVACHOL) 40 MG tablet TAKE ONE TABLET BY MOUTH EVERY DAY 90 tablet 2  . senna (SENOKOT) 8.6 MG tablet Take 1 tablet by mouth at bedtime.    . traMADol (ULTRAM) 50 MG tablet TAKE ONE TABLET BY MOUTH TWICE DAILY AS NEEDED FOR PAIN 360 tablet 0  . triamterene-hydrochlorothiazide (MAXZIDE-25) 37.5-25 MG per tablet TAKE ONE TABLET BY MOUTH ONCE DAILY. 90 tablet 1  . omeprazole (PRILOSEC) 20 MG capsule Take 40 mg by mouth daily.    Marland Kitchen omeprazole (PRILOSEC) 20 MG capsule TAKE ONE CAPSULE DAILY 30 capsule 0   No facility-administered medications prior to visit.    PAST MEDICAL HISTORY: Past Medical History  Diagnosis Date  . Hyperlipidemia   . Recurrent sinusitis   . Breast cancer 1999    T1N0 LEFT BREAST  . 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 Osteoporosis 03-04-2011     LAST BONE DENSITY 03-04-2011  . Trigeminal neuralgia   . Degenerative disc disease   . History of bladder infections     RECURRENT  . Thyroid disease     PAST SURGICAL HISTORY: Past Surgical History  Procedure Laterality Date  . Bladder suspension  2005  . Left breast lumpectomy  1999  . Cancerous ln removed  2008  . Breast surgery  1999    Lumpectomy L Br  . Breast surgery  2007    Lymph Node Removal L Chest Wall  . Abdominal hysterectomy  2005    Complete    FAMILY HISTORY: Family History  Problem Relation Age of Onset  . Cancer Mother     breast  . Stroke Mother   . Hyperlipidemia Mother   . Cancer Father     bladder  . Cancer Son     Breast  .  Cancer Maternal Aunt     Breast  . Stomach cancer Paternal Uncle   . Cancer Paternal Grandmother     Mouth  . Stomach cancer Paternal Uncle     SOCIAL HISTORY: History   Social History  . Marital Status: Married    Spouse Name: [name]     Number of Children: 3  . Years of Education: RN   Occupational History  .      Retired- Therapist, sports   Social History Main Topics  . Smoking status: Never Smoker   . Smokeless tobacco: Never Used  . Alcohol Use: No  . Drug Use: No  . Sexual Activity: Not on file     Comment: housewife, married, 3  kids.   Other Topics Concern  . Not on file   Social History Narrative   Patient lives at home with her husband [name])   Retired - RN   Right handed.   Caffeine two cups coffee daily and one sweet tea.      PHYSICAL EXAM  Filed Vitals:   07/18/14 0906  BP: 114/68  Pulse: 69  Height: 5\' 7"  (1.702 m)  Weight: 129 lb (58.514 kg)   Body mass index is 20.2 kg/(m^2).  Generalized: Well developed, in no acute distress   Neurological examination  Mentation: Alert oriented to time, place, history taking. Follows all commands speech and language fluent Cranial nerve II-XII: Pupils were equal round reactive to light. Extraocular movements were full, visual field were full on confrontational test. Facial sensation and strength were normal. Uvula tongue midline. Head turning and shoulder shrug  were normal and symmetric. Motor: The motor testing reveals 5 over 5 strength of all 4 extremities. Good symmetric motor tone is noted throughout.  Sensory: Sensory testing is intact to soft touch on all 4 extremities. No evidence of extinction is noted.  Coordination: Cerebellar testing reveals good finger-nose-finger and heel-to-shin bilaterally.  Gait and station: Gait is normal. Tandem gait is normal. Romberg is negative. No drift is seen.  Reflexes: Deep tendon reflexes are symmetric and normal bilaterally.    DIAGNOSTIC DATA (LABS, IMAGING, TESTING) -  I reviewed patient records, labs, notes, testing and imaging myself where available.  Lab Results  Component Value Date   WBC 4.4 05/21/2014   HGB 13.0 05/21/2014   HCT 39.6 05/21/2014   MCV 93.4 05/21/2014   PLT 216 05/21/2014      Component Value Date/Time   NA 141 07/16/2014 1021   NA 144 05/21/2014 0950   K 4.2 07/16/2014 1021   K 3.7 05/21/2014 0950   CL 100 07/16/2014 1021   CL 98 10/02/2012 1125   CO2 31 07/16/2014 1021   CO2 33* 05/21/2014 0950   GLUCOSE 78 07/16/2014 1021   GLUCOSE 90 05/21/2014 0950   GLUCOSE 80 10/02/2012 1125   BUN 20 07/16/2014 1021   BUN 18.3 05/21/2014 0950   CREATININE 0.78 07/16/2014 1021   CREATININE 0.9 05/21/2014 0950   CREATININE 0.72 01/11/2012 0922   CALCIUM 9.7 07/16/2014 1021   CALCIUM 10.3 05/21/2014 0950   PROT 6.6 07/16/2014 1021   PROT 6.7 05/21/2014 0950   ALBUMIN 4.7 07/16/2014 1021   ALBUMIN 4.1 05/21/2014 0950   AST 22 07/16/2014 1021   AST 18 05/21/2014 0950   ALT 16 07/16/2014 1021   ALT 10 05/21/2014 0950   ALKPHOS 40 07/16/2014 1021   ALKPHOS 43 05/21/2014 0950   BILITOT 0.4 07/16/2014 1021   BILITOT 0.25 05/21/2014 0950   GFRNONAA >89 11/13/2013 1031   GFRAA >89 11/13/2013 1031   Lab Results  Component Value Date   CHOL 246* 11/13/2013   HDL 99 11/13/2013   LDLCALC 116* 11/13/2013   TRIG 154* 11/13/2013   CHOLHDL 2.5 11/13/2013    Lab Results  Component Value Date   TSH 1.288 04/21/2014      ASSESSMENT AND PLAN 68 y.o. year old female  has a past medical history of Hyperlipidemia; Recurrent sinusitis; Breast cancer (1999); Cancer (03-2006); Osteoporosis (03-04-2011); Trigeminal neuralgia; Degenerative disc disease; History of bladder infections; and Thyroid disease. here with:  1. Trigeminal neuralgia  Patient's facial pain is controlled with Tegretol. Patient will continue Tegretol XR 200 mg twice a day. We will  draw a carbamazepine level at the next visit. Patient just had blood work done  yesterday. If the patient's symptoms worsen or she develops new symptoms  she should let us know. The patient will follow up in 7-8 months.  Ward Givens, MSN, NP-C 07/18/2014, 9:14 AM [facility] [address], [place], [place] [address] [phone]  Note: This document was prepared with digital dictation and possible smart phrase technology. Any transcriptional errors that result from this process are unintentional.

## 2014-07-18 NOTE — Patient Instructions (Signed)
Trigeminal Neuralgia  Trigeminal neuralgia is a nerve disorder that causes sudden attacks of severe facial pain. It is caused by damage to the trigeminal nerve, a major nerve in the face. It is more common in women and in the elderly, although it can also happen in younger patients. Attacks last from a few seconds to several minutes and can occur from a couple of times per year to several times per day. Trigeminal neuralgia can be a very distressing and disabling condition. Surgery may be needed in very severe cases if medical treatment does not give relief.  HOME CARE INSTRUCTIONS    If your caregiver prescribed medication to help prevent attacks, take as directed.   To help prevent attacks:   Chew on the unaffected side of the mouth.   Avoid touching your face.   Avoid blasts of hot or cold air.   Men may wish to grow a beard to avoid having to shave.  SEEK IMMEDIATE MEDICAL CARE IF:   Pain is unbearable and your medicine does not help.   You develop new, unexplained symptoms (problems).   You have problems that may be related to a medication you are taking.  Document Released: 08/12/2000 Document Revised: 11/07/2011 Document Reviewed: 06/12/2009  ExitCare Patient Information 2015 [employer]. This information is not intended to replace advice given to you by your health care provider. Make sure you discuss any questions you have with your health care provider.

## 2014-07-29 NOTE — Progress Notes (Signed)
I agree above plan. 

## 2014-08-01 ENCOUNTER — Ambulatory Visit (INDEPENDENT_AMBULATORY_CARE_PROVIDER_SITE_OTHER): Payer: Medicare Other | Admitting: Family Medicine

## 2014-08-01 VITALS — BP 113/69 | HR 91 | Temp 98.1°F

## 2014-08-01 DIAGNOSIS — M810 Age-related osteoporosis without current pathological fracture: Secondary | ICD-10-CM

## 2014-08-01 MED ORDER — DENOSUMAB 60 MG/ML ~~LOC~~ SOLN
60.0000 mg | Freq: Once | SUBCUTANEOUS | Status: AC
  Administered 2014-08-01: 60 mg via SUBCUTANEOUS

## 2014-08-01 NOTE — Progress Notes (Signed)
   Subjective:    Patient ID: [name], female    DOB: Aug 30, 1945, 68 y.o.   MRN: [id number]  HPI  [name] is here for a Prolia injection. She is taking calcium daily and calcium level on 07/16/2014 was normal.  Calcium 8.4 - 10.5 mg/dL 9.7     Review of Systems     Objective:   Physical Exam        Assessment & Plan:  Patient tolerated injection well without complications. Patient advised to schedule next injection 180 days from today.

## 2014-08-04 ENCOUNTER — Telehealth: Payer: Self-pay | Admitting: Oncology

## 2014-08-04 NOTE — Telephone Encounter (Signed)
s.w. pt and advised on march appt d.t. change.Marland KitchenMarland KitchenMarland KitchenMarland Kitchenpt ok and aware

## 2014-08-13 ENCOUNTER — Encounter: Payer: Self-pay | Admitting: Physician Assistant

## 2014-08-13 ENCOUNTER — Ambulatory Visit (INDEPENDENT_AMBULATORY_CARE_PROVIDER_SITE_OTHER): Payer: Medicare Other | Admitting: Physician Assistant

## 2014-08-13 VITALS — BP 123/76 | HR 86 | Temp 97.9°F | Ht 66.0 in | Wt 129.0 lb

## 2014-08-13 DIAGNOSIS — J209 Acute bronchitis, unspecified: Secondary | ICD-10-CM

## 2014-08-13 MED ORDER — FLUCONAZOLE 150 MG PO TABS: 150.0000 mg | ORAL_TABLET | Freq: Once | ORAL | Status: DC

## 2014-08-13 MED ORDER — HYDROCODONE-HOMATROPINE 5-1.5 MG/5ML PO SYRP: 5.0000 mL | ORAL_SOLUTION | Freq: Three times a day (TID) | ORAL | Status: DC | PRN

## 2014-08-13 MED ORDER — AZITHROMYCIN 250 MG PO TABS: ORAL_TABLET | ORAL | Status: DC

## 2014-08-13 NOTE — Progress Notes (Signed)
   Subjective:    Patient ID: [name], female    DOB: [birth year], 68 y.o.   MRN: [id number]  HPI Patient is a 68 year old female who presents to the clinic with 3 weeks of coughing and production. She now has a lot of sinus pressure. The left-sided throat is also painful. She feels like she's having a lot of drainage on the left side. No fever or chills. She has not tried anything over-the-counter.    Review of Systems  All other systems reviewed and are negative.      Objective:   Physical Exam  Constitutional: She is oriented to person, place, and time. She appears well-developed and well-nourished.  HENT:  Head: Normocephalic and atraumatic.  Right Ear: External ear normal.  Left Ear: External ear normal.  Mouth/Throat: Oropharynx is clear and moist.  TM"s clear.  Maxillary and frontal sinus tenderness to palpation.  Oropharynx with PND.  Nasal turbinates red and swollen.   Eyes: Conjunctivae are normal. Right eye exhibits no discharge. Left eye exhibits no discharge.  Neck: Normal range of motion. Neck supple.  Cardiovascular: Normal rate, regular rhythm and normal heart sounds.   Pulmonary/Chest: Effort normal and breath sounds normal.  Coarse breath sounds.   Lymphadenopathy:    She has no cervical adenopathy.  Neurological: She is alert and oriented to person, place, and time.  Skin: Skin is dry.  Psychiatric: She has a normal mood and affect. Her behavior is normal.          Assessment & Plan:  Acute bronchitis/upper respiratory infection-treated with Z-Pak due to all of her allergies. Diflucan given if develops yeast infection. Hycodan cough syrup was given at bedtime for cough. Follow-up as needed. Handout given.

## 2014-08-18 ENCOUNTER — Telehealth: Payer: Self-pay

## 2014-08-18 ENCOUNTER — Other Ambulatory Visit: Payer: Self-pay | Admitting: Physician Assistant

## 2014-08-18 MED ORDER — LEVOFLOXACIN 500 MG PO TABS: 500.0000 mg | ORAL_TABLET | Freq: Every day | ORAL | Status: DC

## 2014-08-18 NOTE — Telephone Encounter (Signed)
Sent levaquin for 10 days.

## 2014-08-18 NOTE — Telephone Encounter (Signed)
[name] complains she is still having pressure/pain in her face and drainage in her throat. She would like a different antibiotic sent in.

## 2014-08-18 NOTE — Telephone Encounter (Signed)
Patient advised.

## 2014-08-19 ENCOUNTER — Other Ambulatory Visit: Payer: Self-pay | Admitting: Family Medicine

## 2014-08-25 ENCOUNTER — Other Ambulatory Visit: Payer: Self-pay | Admitting: Family Medicine

## 2014-08-25 MED ORDER — HYDROCODONE-ACETAMINOPHEN 5-325 MG PO TABS: ORAL_TABLET | ORAL | Status: DC

## 2014-08-25 NOTE — Telephone Encounter (Signed)
Pt informed rx is up front.[name]

## 2014-08-31 ENCOUNTER — Other Ambulatory Visit: Payer: Self-pay

## 2014-08-31 MED ORDER — LEVOTHYROXINE SODIUM 50 MCG PO TABS: ORAL_TABLET | ORAL | Status: DC

## 2014-09-11 ENCOUNTER — Telehealth: Payer: Self-pay | Admitting: Adult Health

## 2014-09-11 NOTE — Telephone Encounter (Signed)
Gae Bon with [employer] questioning status of prior authorization request sent on 09/01/14 for Rx carbamazepine (TEGRETOL-XR) 100 MG 12 hr tablet.  Please call authorization phone # [phone] and advise.

## 2014-09-11 NOTE — Telephone Encounter (Signed)
Info was already sent.  I called back.  They asked that we re-fax it.  I have done so.

## 2014-09-16 ENCOUNTER — Telehealth: Payer: Self-pay | Admitting: Neurology

## 2014-09-17 ENCOUNTER — Ambulatory Visit (INDEPENDENT_AMBULATORY_CARE_PROVIDER_SITE_OTHER): Payer: Medicare Other | Admitting: Family Medicine

## 2014-09-17 ENCOUNTER — Encounter: Payer: Self-pay | Admitting: Family Medicine

## 2014-09-17 VITALS — BP 115/70 | HR 99 | Wt 126.0 lb

## 2014-09-17 DIAGNOSIS — G4700 Insomnia, unspecified: Secondary | ICD-10-CM

## 2014-09-17 DIAGNOSIS — K219 Gastro-esophageal reflux disease without esophagitis: Secondary | ICD-10-CM

## 2014-09-17 DIAGNOSIS — N760 Acute vaginitis: Secondary | ICD-10-CM

## 2014-09-17 DIAGNOSIS — K146 Glossodynia: Secondary | ICD-10-CM

## 2014-09-17 LAB — VITAMIN B12: VITAMIN B 12: 579 pg/mL (ref 211–911)

## 2014-09-17 MED ORDER — OMEPRAZOLE 40 MG PO CPDR: 40.0000 mg | DELAYED_RELEASE_CAPSULE | Freq: Every day | ORAL | Status: DC

## 2014-09-17 MED ORDER — LORAZEPAM 1 MG PO TABS: ORAL_TABLET | ORAL | Status: DC

## 2014-09-17 MED ORDER — TRAMADOL HCL 50 MG PO TABS: 50.0000 mg | ORAL_TABLET | Freq: Three times a day (TID) | ORAL | Status: DC | PRN

## 2014-09-17 MED ORDER — OMEPRAZOLE 20 MG PO CPDR: 20.0000 mg | DELAYED_RELEASE_CAPSULE | Freq: Every day | ORAL | Status: DC

## 2014-09-17 NOTE — Progress Notes (Signed)
   Subjective:    Patient ID: [name], female    DOB: 1946/03/25, 69 y.o.   MRN: [id number]  HPI  Insomnia does complain of some sleep issues and feeling tired but otherwise denies any depressive symptoms and she also feels like her anxiety symptoms are extremely well controlled.  Uses her xanax mostly at sleep.   GERD - She has been getting a lot of nighttime sxs. She says has been worse over the last coupld of months but some better over teh last week.  She would like refill on her 40 and 20mg  omeprrazole.  She likes we'll take an extra dose to 20 mg omeprazole she's having more nighttime symptoms.  Patient was seen approximately 4 weeks ago for bronchitis. She was treated with antibiotics and was given oral Diflucan. She still feels like she's having some residual irritation and itching vaginally.  Complain some burning tongue for about the last week. She says it comes and goes. Just last briefly. She feels like it's the entire time not just the tip or the back portion. She denies any swelling or difficulty eating. No known triggers. No worsening or alleviating factors. She is prone to fungal infections.  Review of Systems     Objective:   Physical Exam  Constitutional: She is oriented to person, place, and time. She appears well-developed and well-nourished.  HENT:  Head: Normocephalic and atraumatic.  Right Ear: External ear normal.  Left Ear: External ear normal.  Nose: Nose normal.  Mouth/Throat: Oropharynx is clear and moist. No oropharyngeal exudate.  Tongue appears normal.  Cardiovascular: Normal rate, regular rhythm and normal heart sounds.   Pulmonary/Chest: Effort normal and breath sounds normal.  Neurological: She is alert and oriented to person, place, and time.  Skin: Skin is warm and dry.  Psychiatric: She has a normal mood and affect. Her behavior is normal.          Assessment & Plan:  Insomnia-gad 7 score of 0 today and PHQ 9 score of 2 but negative on  the first 2 questions. Well controlled. Okay to refill her Xanax today. She prefers a 90 day supply. She primarily uses it for sleep.Warned about potential S.E.   GERD - will go ahead and refill her omeprazole. She is feeling a little bit better this week. Make sure getting on track with diet. Also consider raising the head of the bed.  Vaginitis-will perform wet prep and call her with results once available. She has been using some over-the-counter creams.  Burning tongue-unclear etiology. Just started a week ago. Did not see trauma or burns or ulcerations on the tongue. No apparent swelling. Will check for B12, B6 and B1 deficiency.

## 2014-09-18 ENCOUNTER — Other Ambulatory Visit: Payer: Self-pay | Admitting: Family Medicine

## 2014-09-18 LAB — WET PREP, GENITAL
Trich, Wet Prep: NONE SEEN
Yeast Wet Prep HPF POC: NONE SEEN

## 2014-09-18 MED ORDER — METRONIDAZOLE 500 MG PO TABS: 500.0000 mg | ORAL_TABLET | Freq: Two times a day (BID) | ORAL | Status: DC

## 2014-09-22 ENCOUNTER — Telehealth: Payer: Self-pay | Admitting: Neurology

## 2014-09-22 ENCOUNTER — Other Ambulatory Visit: Payer: Self-pay

## 2014-09-22 LAB — VITAMIN B6: Vitamin B6: 18.9 ng/mL (ref 2.1–21.7)

## 2014-09-22 LAB — VITAMIN B1: VITAMIN B1 (THIAMINE): 31 nmol/L — AB (ref 8–30)

## 2014-09-22 MED ORDER — LORAZEPAM 1 MG PO TABS: ORAL_TABLET | ORAL | Status: DC

## 2014-09-22 MED ORDER — CARBAMAZEPINE ER 100 MG PO TB12: 200.0000 mg | ORAL_TABLET | Freq: Two times a day (BID) | ORAL | Status: DC

## 2014-09-22 NOTE — Telephone Encounter (Signed)
Patient is calling to get new 3 month Rx for Carbamazepine extended release 100 mg capsules. New pharmacy [facility] [phone]. Please call.

## 2014-09-22 NOTE — Telephone Encounter (Signed)
Rx has been sent.  I called back, got no answer.  Left message. 

## 2014-09-24 ENCOUNTER — Other Ambulatory Visit: Payer: Self-pay

## 2014-09-24 MED ORDER — CARBAMAZEPINE ER 100 MG PO TB12: 200.0000 mg | ORAL_TABLET | Freq: Two times a day (BID) | ORAL | Status: DC

## 2014-09-24 NOTE — Telephone Encounter (Signed)
[name] at 09/24/2014 10:20 AM     Status: Signed       Expand All Collapse All   Patient stated she needs generic Rx CARBAMAZEPINE XR 100 mg forwarded to [facility].

## 2014-09-24 NOTE — Telephone Encounter (Signed)
Rx has been resent to [facility] per patient request.  We have not previously indicated DAW 1 on Rx.  I called patient back.  Phone rang over 20 times with no answer, no option to leave message.

## 2014-09-24 NOTE — Telephone Encounter (Signed)
Patient stated she needs generic Rx CARBAMAZEPINE XR 100 mg forwarded to [facility].  Brand name medication is a tier 4 and too expensive.  Please call and advise.

## 2014-10-01 ENCOUNTER — Telehealth: Payer: Self-pay

## 2014-10-01 NOTE — Telephone Encounter (Signed)
Sent for PA through cover my meds and waiting on authorization decision.

## 2014-10-03 ENCOUNTER — Telehealth: Payer: Self-pay

## 2014-10-03 NOTE — Telephone Encounter (Signed)
Received a denial for Lorazepam 10/03/14 changed diagnosis code and resubmitted in cover my meds. Waiting on auth. - cf

## 2014-10-06 ENCOUNTER — Ambulatory Visit (INDEPENDENT_AMBULATORY_CARE_PROVIDER_SITE_OTHER): Payer: Medicare Other | Admitting: Family Medicine

## 2014-10-06 ENCOUNTER — Encounter: Payer: Self-pay | Admitting: Family Medicine

## 2014-10-06 VITALS — BP 128/79 | HR 77 | Ht 66.0 in | Wt 126.0 lb

## 2014-10-06 DIAGNOSIS — L304 Erythema intertrigo: Secondary | ICD-10-CM

## 2014-10-06 DIAGNOSIS — N644 Mastodynia: Secondary | ICD-10-CM

## 2014-10-06 NOTE — Patient Instructions (Signed)
Aquaphor twice a day.

## 2014-10-06 NOTE — Progress Notes (Signed)
   Subjective:    Patient ID: [name], female    DOB: [birth year], 69 y.o.   MRN: [id number]  HPI Patient has had some tenderness on and off over the left nipple for the last month. She has been picking up her grandchildren and wonders if her bra may have rubbed as well. She has been in here before for similar symptoms. Her mammograms are up today and in fact she is due in 2 months for repeat in April. She denies any lumps bones or nipple discharge. No fevers chills or sweats. She has been trying to actually loosen her bra to make it more comfortable.  Review of Systems     Objective:   Physical Exam  Constitutional: She appears well-developed and well-nourished.  HENT:  Head: Normocephalic and atraumatic.  Pulmonary/Chest: Left breast exhibits no inverted nipple, no mass, no nipple discharge, no skin change and no tenderness.  She does have some scar tissue of the left breast that is well-healed. Did not palpate any nodules or lymphadenopathy in the breast or the left axilla. No discharge from the nipple. She does have some slight dry scaling erythema of the nipple. Most consistent with chafing.  Skin: Skin is warm and dry.  Psychiatric: She has a normal mood and affect. Her behavior is normal.          Assessment & Plan:  Chafing - discussed diagnosis. Will recommend treatment with Ortho 4 to be applied twice a day. Apply immediately after bath and shower. Also recommend that she actually where a more fitted bra that help support and decrease his friction of the nipple on the cup of the bra. If not improving over the next 2-3 weeks and please give [place] a call back. I did not palpate any lesions or lymphadenopathy today and reassured her. I do not think we need to do her mammogram early this year.

## 2014-10-08 ENCOUNTER — Other Ambulatory Visit: Payer: Self-pay

## 2014-10-08 MED ORDER — HYDROCODONE-ACETAMINOPHEN 5-325 MG PO TABS: ORAL_TABLET | ORAL | Status: DC

## 2014-10-09 ENCOUNTER — Other Ambulatory Visit: Payer: Self-pay | Admitting: Family Medicine

## 2014-10-13 ENCOUNTER — Telehealth: Payer: Self-pay

## 2014-10-13 NOTE — Telephone Encounter (Signed)
Received Fax for Lorazepam and medication was denied due to not being prescribed for FDA labeled or medically accepted indication

## 2014-10-14 ENCOUNTER — Telehealth: Payer: Self-pay | Admitting: Family Medicine

## 2014-10-14 NOTE — Telephone Encounter (Signed)
Please call patient and let her know that her [employer] will not pay for lorazepam. I believe they will pay for one of the other benzodiazepines. If she would like [place] to go ahead and send a new prescription for Klonopin I'll be happy to do so.

## 2014-10-15 NOTE — Telephone Encounter (Signed)
Patient will discuss at [place] appointment.

## 2014-10-16 ENCOUNTER — Ambulatory Visit (INDEPENDENT_AMBULATORY_CARE_PROVIDER_SITE_OTHER): Payer: Medicare Other | Admitting: Family Medicine

## 2014-10-16 ENCOUNTER — Encounter: Payer: Self-pay | Admitting: Family Medicine

## 2014-10-16 VITALS — BP 129/71 | HR 96 | Ht 66.0 in | Wt 126.0 lb

## 2014-10-16 DIAGNOSIS — G4700 Insomnia, unspecified: Secondary | ICD-10-CM

## 2014-10-16 DIAGNOSIS — N644 Mastodynia: Secondary | ICD-10-CM

## 2014-10-16 MED ORDER — MUPIROCIN 2 % EX OINT: TOPICAL_OINTMENT | Freq: Two times a day (BID) | CUTANEOUS | Status: DC

## 2014-10-16 NOTE — Patient Instructions (Signed)
Lorazepam - Decrease to 1/2 tab nightly for 3 weeks, then 1/4 tab nightly for 2 weeks, then stop

## 2014-10-16 NOTE — Progress Notes (Signed)
   Subjective:    Patient ID: [name], female    DOB: December 17, 1945, 69 y.o.   MRN: [id number]  HPI Left nipple pain follow-up - Aquapohor did help . She was using it twice a day and says really within a few days it was much improved. She's also been avoiding wearing a bra because it feels more comfortable that way. She says over the last few days it started to look red and irritated again and wonders if she needs an antibiotic. No fevers chills or sweats or streaking of redness on the nipple.   Insomnia -  Discussed with her that her insurance will no longer pay for lorazepam. They made a 4 different benzodiazepine but all of them have increased risk of dementia and falls. Encourage her to wean off of it. She says some nights when she takes it doesn't really help her sleep anyway. Most nights she has a hard time falling asleep. Some nights rest hard time staying asleep. She does not have a specific bedtime.   Review of Systems     Objective:   Physical Exam  Constitutional: She is oriented to person, place, and time. She appears well-developed and well-nourished.  HENT:  Head: Normocephalic and atraumatic.  Eyes: Conjunctivae and EOM are normal.  Cardiovascular: Normal rate.   Pulmonary/Chest: Effort normal.  Some slight erythema over the tip of the nipple. No cracking, drainage or leakage from the nipple. No streaking. No induration or abscess.  Neurological: She is alert and oriented to person, place, and time.  Skin: Skin is dry. No pallor.  Psychiatric: She has a normal mood and affect. Her behavior is normal.          Assessment & Plan:  Insomnia - taper the lorazaepam . Taper written out so that she can wean off the medication without causing any kind of rebound insomnia. Certainly may want to try over-the-counter melatonin as this can be helpful to reset her sleep clock. Strongly encouraged her to set a specific bedtime. Also discussed sleep hygiene and making sure that the  bedroom is cool quiet dark and inducible to sleep. No animals, no TV etc.   Left nipples pain -still secondary to chafing. Again we'll go ahead and prescribe a topical mupirocin ointment but mainly need to keep with using the Aquaphor which I think is with going to help her the most as we get through the winter.

## 2014-11-04 ENCOUNTER — Telehealth: Payer: Self-pay | Admitting: *Deleted

## 2014-11-04 NOTE — Telephone Encounter (Signed)
She has had similar problems in past, I believe. I have openings on 3-18 if she wants to be seen before 3-24 thanks

## 2014-11-04 NOTE — Telephone Encounter (Signed)
Called patient and she is agreeable to see Dr. Marko Plume sooner on 11-14-14. Patient wrote down new appt date and time. Told patient to please call us back sooner if the burning worsens or she develops additional symptoms - patient agreeable to this.

## 2014-11-04 NOTE — Telephone Encounter (Signed)
Patient left voicemail stating that she had developed left breast nipple burning. Patient PCP prescribed her topical cream. Patient will be seen by Dr. Marko Plume on 11/20/14. Patient has recently developed nipple reddness on nipple. Patient information forwarded to Dr. Marko Plume and nurse.

## 2014-11-12 ENCOUNTER — Other Ambulatory Visit: Payer: Self-pay | Admitting: Oncology

## 2014-11-12 DIAGNOSIS — C50912 Malignant neoplasm of unspecified site of left female breast: Secondary | ICD-10-CM

## 2014-11-14 ENCOUNTER — Other Ambulatory Visit (HOSPITAL_BASED_OUTPATIENT_CLINIC_OR_DEPARTMENT_OTHER): Payer: Medicare Other

## 2014-11-14 ENCOUNTER — Encounter: Payer: Self-pay | Admitting: Oncology

## 2014-11-14 ENCOUNTER — Ambulatory Visit (HOSPITAL_BASED_OUTPATIENT_CLINIC_OR_DEPARTMENT_OTHER): Payer: Medicare Other | Admitting: Oncology

## 2014-11-14 VITALS — BP 142/72 | HR 93 | Temp 97.8°F | Resp 18 | Ht 66.0 in | Wt 127.3 lb

## 2014-11-14 DIAGNOSIS — C50912 Malignant neoplasm of unspecified site of left female breast: Secondary | ICD-10-CM

## 2014-11-14 DIAGNOSIS — T386X5A Adverse effect of antigonadotrophins, antiestrogens, antiandrogens, not elsewhere classified, initial encounter: Secondary | ICD-10-CM

## 2014-11-14 DIAGNOSIS — M810 Age-related osteoporosis without current pathological fracture: Secondary | ICD-10-CM

## 2014-11-14 DIAGNOSIS — M818 Other osteoporosis without current pathological fracture: Secondary | ICD-10-CM

## 2014-11-14 DIAGNOSIS — R102 Pelvic and perineal pain: Secondary | ICD-10-CM

## 2014-11-14 DIAGNOSIS — C771 Secondary and unspecified malignant neoplasm of intrathoracic lymph nodes: Secondary | ICD-10-CM

## 2014-11-14 DIAGNOSIS — N644 Mastodynia: Secondary | ICD-10-CM

## 2014-11-14 DIAGNOSIS — D72819 Decreased white blood cell count, unspecified: Secondary | ICD-10-CM

## 2014-11-14 LAB — COMPREHENSIVE METABOLIC PANEL (CC13)
ALT: 15 U/L (ref 0–55)
ANION GAP: 9 meq/L (ref 3–11)
AST: 22 U/L (ref 5–34)
Albumin: 4.3 g/dL (ref 3.5–5.0)
Alkaline Phosphatase: 35 U/L — ABNORMAL LOW (ref 40–150)
BUN: 15.8 mg/dL (ref 7.0–26.0)
CHLORIDE: 102 meq/L (ref 98–109)
CO2: 30 meq/L — AB (ref 22–29)
Calcium: 9.8 mg/dL (ref 8.4–10.4)
Creatinine: 0.9 mg/dL (ref 0.6–1.1)
EGFR: 67 mL/min/{1.73_m2} — ABNORMAL LOW (ref >90)
Glucose: 98 mg/dl (ref 70–140)
POTASSIUM: 4.1 meq/L (ref 3.5–5.1)
SODIUM: 141 meq/L (ref 136–145)
TOTAL PROTEIN: 6.6 g/dL (ref 6.4–8.3)
Total Bilirubin: 0.24 mg/dL (ref 0.20–1.20)

## 2014-11-14 LAB — CBC WITH DIFFERENTIAL/PLATELET
BASO%: 0.4 % (ref 0.0–2.0)
Basophils Absolute: 0 10*3/uL (ref 0.0–0.1)
EOS%: 0.9 % (ref 0.0–7.0)
Eosinophils Absolute: 0 10*3/uL (ref 0.0–0.5)
HEMATOCRIT: 39.6 % (ref 34.8–46.6)
HEMOGLOBIN: 12.8 g/dL (ref 11.6–15.9)
LYMPH%: 32.1 % (ref 14.0–49.7)
MCH: 30.5 pg (ref 25.1–34.0)
MCHC: 32.4 g/dL (ref 31.5–36.0)
MCV: 94 fL (ref 79.5–101.0)
MONO#: 0.3 10*3/uL (ref 0.1–0.9)
MONO%: 6.5 % (ref 0.0–14.0)
NEUT#: 2.5 10*3/uL (ref 1.5–6.5)
NEUT%: 60.1 % (ref 38.4–76.8)
Platelets: 217 10*3/uL (ref 145–400)
RBC: 4.21 10*6/uL (ref 3.70–5.45)
RDW: 13.4 % (ref 11.2–14.5)
WBC: 4.2 10*3/uL (ref 3.9–10.3)
lymph#: 1.3 10*3/uL (ref 0.9–3.3)
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
## 2014-11-16 DIAGNOSIS — M818 Other osteoporosis without current pathological fracture: Secondary | ICD-10-CM | POA: Insufficient documentation

## 2014-11-16 DIAGNOSIS — C771 Secondary and unspecified malignant neoplasm of intrathoracic lymph nodes: Secondary | ICD-10-CM | POA: Insufficient documentation

## 2014-11-16 DIAGNOSIS — T386X5A Adverse effect of antigonadotrophins, antiestrogens, antiandrogens, not elsewhere classified, initial encounter: Secondary | ICD-10-CM

## 2014-11-16 DIAGNOSIS — N644 Mastodynia: Secondary | ICD-10-CM | POA: Insufficient documentation

## 2014-11-19 ENCOUNTER — Other Ambulatory Visit: Payer: Medicare Other

## 2014-11-19 ENCOUNTER — Ambulatory Visit: Payer: Medicare Other | Admitting: Oncology

## 2014-11-20 ENCOUNTER — Ambulatory Visit: Payer: Medicare Other | Admitting: Oncology

## 2014-11-20 ENCOUNTER — Other Ambulatory Visit: Payer: Medicare Other

## 2014-11-25 ENCOUNTER — Ambulatory Visit: Payer: Medicare Other

## 2014-11-25 ENCOUNTER — Ambulatory Visit
Admission: RE | Admit: 2014-11-25 | Discharge: 2014-11-25 | Disposition: A | Discharge status: Home / Self Care | Payer: Medicare Other | Source: Ambulatory Visit | Attending: Oncology | Admitting: Oncology

## 2014-11-25 DIAGNOSIS — Z1231 Encounter for screening mammogram for malignant neoplasm of breast: Secondary | ICD-10-CM

## 2014-11-26 ENCOUNTER — Telehealth: Payer: Self-pay

## 2014-11-26 ENCOUNTER — Other Ambulatory Visit: Payer: Self-pay | Admitting: *Deleted

## 2014-11-26 MED ORDER — HYDROCODONE-ACETAMINOPHEN 5-325 MG PO TABS: ORAL_TABLET | ORAL | Status: DC

## 2014-11-26 NOTE — Telephone Encounter (Signed)
-----   Message from Gordy Levan, MD sent at 11/16/2014  1:42 PM EDT ----- I need to be sure to see mammogram and bone density reports from [facility], to be done 3-29. May need Prolia at [facility], or may need to stop Femara  thanks

## 2014-11-26 NOTE — Telephone Encounter (Signed)
Spoke with [name] in follow up of bone density scan not done at 11-25-14 appointment at [facility]  with mammogram. She stated that one was not scheduled.  Told her that she can call the [facility] to schedule as the order is in for the done density scan. It is medically necessary for her to have yearly scans as she is on high risk med. Ms. Camp stated that she will call Dr. Mariana Kaufman office and let Dr. Mariana Kaufman nurse know when bone density scheduled. Told her that mammogram was negative per [facility] report. Routed note to Dr. Marko Plume for review.

## 2014-11-26 NOTE — Telephone Encounter (Signed)
Told [name] that the [employer] said that she would need to sign a waiver as ins. Companies do not pay for yearly bone density scans.  The cost given to this nurse was 269.00. Told [name] to let Dr. Mariana Kaufman nurse know when bone density scheduled. [name] said that she could cover the cost of the scan if needed.  Told her to verify the cost when she makes appointment.  She can also call her insurance company and inquire as she is on high risk med, postmenopausal, Hx Breast CA, and osteoporotic. It is medically necessary to have bone density done yealy per Dr. Marko Plume.

## 2014-11-27 ENCOUNTER — Telehealth: Payer: Self-pay | Admitting: *Deleted

## 2014-11-27 NOTE — Telephone Encounter (Signed)
Patient called and wanted to inform Dr. Marko Plume that she has scheduled her bone density test for 12/01/14. I told patient that this message will be given to Dr. Marko Plume and her nurse. Patient verbalized understanding.

## 2014-11-28 ENCOUNTER — Ambulatory Visit (INDEPENDENT_AMBULATORY_CARE_PROVIDER_SITE_OTHER): Payer: Medicare Other | Admitting: Family Medicine

## 2014-11-28 ENCOUNTER — Telehealth: Payer: Self-pay | Admitting: *Deleted

## 2014-11-28 ENCOUNTER — Encounter: Payer: Self-pay | Admitting: Family Medicine

## 2014-11-28 VITALS — BP 123/73 | HR 96 | Ht 66.0 in | Wt 133.0 lb

## 2014-11-28 DIAGNOSIS — G4700 Insomnia, unspecified: Secondary | ICD-10-CM | POA: Diagnosis not present

## 2014-11-28 DIAGNOSIS — K2100 Gastro-esophageal reflux disease with esophagitis, without bleeding: Secondary | ICD-10-CM

## 2014-11-28 DIAGNOSIS — N760 Acute vaginitis: Secondary | ICD-10-CM | POA: Diagnosis not present

## 2014-11-28 LAB — WET PREP FOR TRICH, YEAST, CLUE
Clue Cells Wet Prep HPF POC: NONE SEEN
Trich, Wet Prep: NONE SEEN
WBC, Wet Prep HPF POC: NONE SEEN
YEAST WET PREP: NONE SEEN

## 2014-11-28 MED ORDER — OMEPRAZOLE 20 MG PO CPDR: 20.0000 mg | DELAYED_RELEASE_CAPSULE | Freq: Two times a day (BID) | ORAL | Status: DC

## 2014-11-28 NOTE — Addendum Note (Signed)
Addended by: Teddy Spike on: 11/28/2014 11:18 AM   Modules accepted: Level of Service

## 2014-11-28 NOTE — Telephone Encounter (Signed)
[facility] called and states insurance will not cover omeprazole 20 bid but the will cover 40 mg tab. Wants to switch to 40 mg so insurance will cover this.okd the switchnotified spouse

## 2014-11-28 NOTE — Progress Notes (Signed)
   Subjective:    Patient ID: [name], female    DOB: Nov 21, 1945, 69 y.o.   MRN: [id number]  HPI Inosmnia - off the temazepam. But still not sleeping well. She tried some strategies we discussed such as running a than.  She hasn't tried anything OTC.    GERD-she has been taking members all 40 mg in the morning and and 20 mg in the evening. She's been having some more breakthrough symptoms which is why she's been adding the 20 mg dose. It has been helping.  Has noticed a thick white discharge.  Used OTC 1 day treatment and then used the 3 days capsules.  Has been trying to use less of the premarin and thinks has been dryer.  Was itching initally but says better since did OTC tx but stil lhas a white discharge.    Review of Systems     Objective:   Physical Exam  Constitutional: She is oriented to person, place, and time. She appears well-developed and well-nourished.  HENT:  Head: Normocephalic and atraumatic.  Eyes: Conjunctivae and EOM are normal.  Cardiovascular: Normal rate.   Pulmonary/Chest: Effort normal.  Neurological: She is alert and oriented to person, place, and time.  Skin: Skin is dry. No pallor.  Psychiatric: She has a normal mood and affect. Her behavior is normal.          Assessment & Plan:  Insomnia - Recommend trial of melatonin OTC ot help her.  Continue to work on sleep hygiene.   Vaginitis - wet prep performed. We'll call results once available.  GERD - .  Faythe Ghee to continue the extra 20 mg in the evening for about 2 more weeks just to get everything under control. I think a lot of her anxiety has gone down since she got good news back on her report with her oncologist. This should help lower her stress levels hopefully. I would like to get her back down to the 40 mg a day and can use Tums as needed for breakthrough. And hopefully if she's doing well on a few months we might even be able to get her down to 20 g once a day.

## 2014-11-28 NOTE — Addendum Note (Signed)
Addended by: Teddy Spike on: 11/28/2014 11:29 AM   Modules accepted: Orders

## 2014-12-01 ENCOUNTER — Other Ambulatory Visit: Payer: Self-pay | Admitting: Oncology

## 2014-12-01 ENCOUNTER — Encounter (INDEPENDENT_AMBULATORY_CARE_PROVIDER_SITE_OTHER): Payer: Self-pay

## 2014-12-01 ENCOUNTER — Ambulatory Visit
Admission: RE | Admit: 2014-12-01 | Discharge: 2014-12-01 | Disposition: A | Discharge status: Home / Self Care | Payer: Medicare Other | Source: Ambulatory Visit | Attending: Oncology | Admitting: Oncology

## 2014-12-01 DIAGNOSIS — M818 Other osteoporosis without current pathological fracture: Secondary | ICD-10-CM

## 2014-12-01 DIAGNOSIS — T386X5A Adverse effect of antigonadotrophins, antiestrogens, antiandrogens, not elsewhere classified, initial encounter: Principal | ICD-10-CM

## 2014-12-02 ENCOUNTER — Telehealth: Payer: Self-pay | Admitting: Oncology

## 2014-12-02 ENCOUNTER — Telehealth: Payer: Self-pay

## 2014-12-02 NOTE — Telephone Encounter (Signed)
Spoke with [name] regarding the results of the Bone Density Scan and recommendations as noted below by Dr. Marko Plume.  Ms. Hayse is agreeable to continue with the Prolia. Reviewed the hydration with sports drinks as noted below. Patient verbalized understanding.   Sent POF to scheduling.

## 2014-12-02 NOTE — Telephone Encounter (Signed)
Called patient with  Injection appointment

## 2014-12-02 NOTE — Telephone Encounter (Signed)
-----   Message from Gordy Levan, MD sent at 12/01/2014  5:10 PM EDT ----- Re bone density scan 12-01-14  Please let patient know that she is still osteoporotic, but some better in lumbar spine and a little better in hip.  I think Prolia is really worth continuing. She has first dose by Dr Madilyn Fireman 08-01-14, due in 6 months = early June. We can do it at this office if she agrees; I have put order in for June 6/ POF not done/ can adjust date if needed. Recommend she drink sports drink like gatorade ~ 16 oz before and after the Prolia injection, which sometimes can help slight fatigue afterwards.  thanks

## 2014-12-11 ENCOUNTER — Encounter: Payer: Self-pay | Admitting: Emergency Medicine

## 2014-12-11 ENCOUNTER — Emergency Department
Admission: EM | Admit: 2014-12-11 | Discharge: 2014-12-11 | Disposition: A | Discharge status: Home / Self Care | Payer: Medicare Other | Source: Home / Self Care | Attending: Emergency Medicine | Admitting: Emergency Medicine

## 2014-12-11 DIAGNOSIS — J209 Acute bronchitis, unspecified: Secondary | ICD-10-CM | POA: Diagnosis not present

## 2014-12-11 DIAGNOSIS — J0121 Acute recurrent ethmoidal sinusitis: Secondary | ICD-10-CM

## 2014-12-11 MED ORDER — CLARITHROMYCIN 500 MG PO TABS: ORAL_TABLET | ORAL | Status: DC

## 2014-12-11 MED ORDER — PREDNISONE 10 MG (21) PO TBPK: ORAL_TABLET | ORAL | Status: DC

## 2014-12-11 NOTE — ED Notes (Signed)
Cough x 8 days, this morning started getting dizzy, headache, vomited once, right ear is "bothering", she has meniere's disease.

## 2014-12-11 NOTE — ED Provider Notes (Signed)
CSN: [id number]     Arrival date & time 12/11/14  1353 History   First MD Initiated Contact with Patient 12/11/14 1422     Chief Complaint  Patient presents with  . Cough   (Consider location/radiation/quality/duration/timing/severity/associated sxs/prior Treatment) HPI Cough x 8 days, this morning started getting dizzy, headache, vomited once, right ear is "bothering", she has meniere's disease. Took dramamine, nausea resolved.   Onset: 8 days Facial/sinus pressure with discolored nasal mucus, cough   Severity: moderate Tried OTC meds without significant relief.  Symptoms:  +low-grade Fever  + URI prodrome with nasal congestion + Minimal swollen neck glands + mild Sinus Headache + mild ear pressure  + Allergy symptoms No significant Sore Throat No eye symptoms     + Cough, occ productive No chest pain No shortness of breath  Rare wheezing  No Abdominal Pain No Nausea currently No Vomiting currently No diarrhea  No Myalgias No focal neurologic symptoms Currently denies vertigo No syncope No Rash  No Urinary symptoms  Remainder of Review of Systems negative for acute change except as noted in the HPI.  Past Medical History  Diagnosis Date  . Hyperlipidemia   . Recurrent sinusitis   . Breast cancer 1999    T1N0 LEFT BREAST  . 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 Osteoporosis 03-04-2011     LAST BONE DENSITY 03-04-2011  . Trigeminal neuralgia   . Degenerative disc disease   . History of bladder infections     RECURRENT  . Thyroid disease    Past Surgical History  Procedure Laterality Date  . Bladder suspension  2005  . Left breast lumpectomy  1999  . Cancerous ln removed  2008  . Breast surgery  1999    Lumpectomy L Br  . Breast surgery  2007    Lymph Node Removal L Chest Wall  . Abdominal hysterectomy  2005    Complete   Family History  Problem Relation Age of Onset  . Cancer Mother     breast   . Stroke Mother   . Hyperlipidemia Mother   . Cancer Father     bladder  . Cancer Son     Breast  . Cancer Maternal Aunt     Breast  . Stomach cancer Paternal Uncle   . Cancer Paternal Grandmother     Mouth  . Stomach cancer Paternal Uncle    History  Substance Use Topics  . Smoking status: Never Smoker   . Smokeless tobacco: Never Used  . Alcohol Use: No   OB History    No data available     Review of Systems  Allergies  Ampicillin; Cefadroxil; Doxycycline; Simvastatin; and Dicloxacillin  Home Medications   Prior to Admission medications   Medication Sig Start Date End Date Taking? Authorizing Provider  azelastine (ASTELIN) 137 MCG/SPRAY nasal spray Place 2 sprays into the nose Twice daily. 08/01/11   Historical Provider, MD  Calcium-Magnesium-Vitamin D (CALCIUM 1200+D3 PO) Take by mouth daily.     Historical Provider, MD  carbamazepine (TEGRETOL-XR) 100 MG 12 hr tablet Take 2 tablets (200 mg total) by mouth 2 (two) times daily. 09/24/14   Marcial Pacas, MD  clarithromycin (BIAXIN) 500 MG tablet Take 1 twice a day for 10 days. 12/11/14   Jacqulyn Cane, MD  conjugated estrogens (PREMARIN) vaginal cream Place 0.625 g vaginally daily. Apply a pea size amount into vagina every 2-3 days    Historical  Provider, MD  denosumab (PROLIA) 60 MG/ML SOLN injection Inject 60 mg into the skin every 6 (six) months. Administer in upper arm, thigh, or abdomen    Historical Provider, MD  fish oil-omega-3 fatty acids 1000 MG capsule Take 2 g by mouth daily.      Historical Provider, MD  latanoprost (XALATAN) 0.005 % ophthalmic solution Place 1 drop into both eyes At bedtime. 08/09/12   Historical Provider, MD  letrozole (FEMARA) 2.5 MG tablet TAKE 1 TABLET (2.5 MG TOTAL) BY MOUTH DAILY. 03/10/14   Lennis Marion Downer, MD  levocetirizine (XYZAL) 5 MG tablet Take 5 mg by mouth daily.      Historical Provider, MD  levothyroxine (SYNTHROID, LEVOTHROID) 50 MCG tablet TAKE ONE TABLET BY MOUTH ONCE DAILY  08/31/14   Hali Marry, MD  montelukast (SINGULAIR) 10 MG tablet Take 1 tablet (10 mg total) by mouth at bedtime. 09/13/11   Hali Marry, MD  omeprazole (PRILOSEC) 20 MG capsule Take 1 capsule (20 mg total) by mouth 2 (two) times daily before a meal. 11/28/14   Hali Marry, MD  pravastatin (PRAVACHOL) 40 MG tablet TAKE ONE TABLET BY MOUTH EVERY DAY 07/09/14   Hali Marry, MD  predniSONE (STERAPRED UNI-PAK 21 TAB) 10 MG (21) TBPK tablet Take tapering dosage over 6 days as directed 12/11/14   Jacqulyn Cane, MD  senna (SENOKOT) 8.6 MG tablet Take 1 tablet by mouth at bedtime.    Historical Provider, MD  traMADol (ULTRAM) 50 MG tablet Take 1 tablet (50 mg total) by mouth 3 (three) times daily as needed for severe pain. for pain 09/17/14   Hali Marry, MD  triamterene-hydrochlorothiazide (MAXZIDE-25) 37.5-25 MG per tablet TAKE ONE TABLET BY MOUTH ONCE DAILY. 07/10/14   Hali Marry, MD   BP 144/83 mmHg  Pulse 89  Temp(Src) 97.8 F (36.6 C) (Oral)  Ht 5\' 7"  (1.702 m)  Wt 129 lb (58.514 kg)  BMI 20.20 kg/m2  SpO2 98% Physical Exam  Constitutional: She is oriented to person, place, and time. She appears well-developed and well-nourished. No distress.  HENT:  Head: Normocephalic and atraumatic.  Right Ear: Tympanic membrane, external ear and ear canal normal.  Left Ear: Tympanic membrane, external ear and ear canal normal.  Nose: Mucosal edema and rhinorrhea present. Right sinus exhibits maxillary sinus tenderness. Left sinus exhibits maxillary sinus tenderness.  Mouth/Throat: Oropharynx is clear and moist. No oral lesions. No oropharyngeal exudate.  Eyes: Right eye exhibits no discharge. Left eye exhibits no discharge. No scleral icterus.  Neck: Neck supple.  Cardiovascular: Normal rate, regular rhythm and normal heart sounds.   Pulmonary/Chest: Effort normal. She has wheezes (rare late exp). She has rhonchi. She has no rales.  Lymphadenopathy:     She has no cervical adenopathy.  Neurological: She is alert and oriented to person, place, and time. No cranial nerve deficit or sensory deficit. She displays a negative Romberg sign. Gait normal.  Skin: Skin is warm and dry.  Psychiatric: She has a normal mood and affect.  Nursing note and vitals reviewed.   ED Course  Procedures (including critical care time) Labs Review Labs Reviewed - No data to display  Imaging Review No results found.   MDM   1. Acute recurrent ethmoidal sinusitis   2. Acute bronchitis, unspecified organism    Treatment options discussed, as well as risks, benefits, alternatives. I'm avoiding amox and cephalosporins because of drug allergy hx. I see in EMR that she's taken Biaxin  in the past, was effective without side effects. Patient voiced understanding and agreement with the following plans: Biaxin 500 BID x 10 days predniSONE (STERAPRED UNI-PAK 21 TAB) 10 MG (21) TBPK tablet Take tapering dosage over 6 days as directed  She declined any cough med or antivert. May use dramamine prn. Follow-up with your primary care doctor in 5-7 days if not improving, or sooner if symptoms become worse. Precautions discussed. Red flags discussed. Questions invited and answered. Patient voiced understanding and agreement.       Jacqulyn Cane, MD 12/11/14 1534

## 2014-12-22 ENCOUNTER — Other Ambulatory Visit: Payer: Self-pay | Admitting: Oncology

## 2014-12-22 DIAGNOSIS — C50919 Malignant neoplasm of unspecified site of unspecified female breast: Secondary | ICD-10-CM

## 2015-01-06 ENCOUNTER — Ambulatory Visit (INDEPENDENT_AMBULATORY_CARE_PROVIDER_SITE_OTHER): Payer: Medicare Other | Admitting: Family Medicine

## 2015-01-06 ENCOUNTER — Encounter: Payer: Self-pay | Admitting: Family Medicine

## 2015-01-06 VITALS — BP 141/72 | HR 105 | Wt 130.0 lb

## 2015-01-06 DIAGNOSIS — J329 Chronic sinusitis, unspecified: Secondary | ICD-10-CM | POA: Diagnosis not present

## 2015-01-06 DIAGNOSIS — A499 Bacterial infection, unspecified: Secondary | ICD-10-CM | POA: Diagnosis not present

## 2015-01-06 DIAGNOSIS — B9689 Other specified bacterial agents as the cause of diseases classified elsewhere: Secondary | ICD-10-CM

## 2015-01-06 MED ORDER — FLUCONAZOLE 150 MG PO TABS: ORAL_TABLET | ORAL | Status: AC

## 2015-01-06 MED ORDER — LEVOFLOXACIN 500 MG PO TABS: 500.0000 mg | ORAL_TABLET | Freq: Every day | ORAL | Status: DC

## 2015-01-06 NOTE — Progress Notes (Signed)
CC: [name] is a 69 y.o. female is here for Sinusitis   Subjective: HPI:  Last month she was treated for bacterial sinusitis with Biaxin and prednisone. She felt that it helped somewhat but that she never got back to her normal state of health with respect to nasal congestion pressure in her forehead and sinuses and also discomfort in her cheeks that radiated into her upper molars. Over the weekend she started to feel like she was quickly deteriorated with symptoms going from mild to moderate in severity. No benefit from ibuprofen. She continues on Astelin, Xyzal, montelukast and does not feel like her allergies are acting up. She denies fevers, chills, wheezing, chest pain, shortness of breath. Review of systems positive for postnasal drip and dry cough   Review Of Systems Outlined In HPI  Past Medical History  Diagnosis Date  . Hyperlipidemia   . Recurrent sinusitis   . Breast cancer 1999    T1N0 LEFT BREAST  . 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 Osteoporosis 03-04-2011     LAST BONE DENSITY 03-04-2011  . Trigeminal neuralgia   . Degenerative disc disease   . History of bladder infections     RECURRENT  . Thyroid disease     Past Surgical History  Procedure Laterality Date  . Bladder suspension  2005  . Left breast lumpectomy  1999  . Cancerous ln removed  2008  . Breast surgery  1999    Lumpectomy L Br  . Breast surgery  2007    Lymph Node Removal L Chest Wall  . Abdominal hysterectomy  2005    Complete   Family History  Problem Relation Age of Onset  . Cancer Mother     breast  . Stroke Mother   . Hyperlipidemia Mother   . Cancer Father     bladder  . Cancer Son     Breast  . Cancer Maternal Aunt     Breast  . Stomach cancer Paternal Uncle   . Cancer Paternal Grandmother     Mouth  . Stomach cancer Paternal Uncle     History   Social History  . Marital Status: Married    Spouse Name: [name]   . Number  of Children: 3  . Years of Education: RN   Occupational History  .      Retired- Therapist, sports   Social History Main Topics  . Smoking status: Never Smoker   . Smokeless tobacco: Never Used  . Alcohol Use: No  . Drug Use: No  . Sexual Activity: Not on file     Comment: housewife, married, 3 kids.   Other Topics Concern  . Not on file   Social History Narrative   Patient lives at home with her husband [name])   Retired - RN   Right handed.   Caffeine two cups coffee daily and one sweet tea.     Objective: BP 141/72 mmHg  Pulse 105  Wt 130 lb (58.968 kg)  SpO2 95%  General: Alert and Oriented, No Acute Distress HEENT: Pupils equal, round, reactive to light. Conjunctivae clear.  External ears unremarkable, canals clear with intact TMs with appropriate landmarks.  Middle ear appears open without effusion. Pink inferior turbinates.  Moist mucous membranes, pharynx without inflammation nor lesions other than moderate cobblestoning and postnasal drip.  Neck supple without palpable lymphadenopathy nor abnormal masses. Lungs: Clear to auscultation bilaterally, no wheezing/ronchi/rales.  Comfortable work  of breathing. Good air movement. Mental Status: No depression, anxiety, nor agitation. Skin: Warm and dry.  Assessment & Plan: Ryenn was seen today for sinusitis.  Diagnoses and all orders for this visit:  Bacterial sinusitis Orders: -     levofloxacin (LEVAQUIN) 500 MG tablet; Take 1 tablet (500 mg total) by mouth daily. -     fluconazole (DIFLUCAN) 150 MG tablet; Take one tab, may take second tab if no improvement after 72 hours.   bacterial sinusitis: Start levofloxacin, she requests fluconazole to keep on hand just in case she develops a case of vaginal candidiasis which she tells me his common when she's been on multiple antibiotics in a 1-2 month timeframe.  Return if symptoms worsen or fail to improve.

## 2015-01-09 ENCOUNTER — Other Ambulatory Visit: Payer: Self-pay

## 2015-01-09 MED ORDER — HYDROCODONE-ACETAMINOPHEN 5-325 MG PO TABS: ORAL_TABLET | ORAL | Status: DC

## 2015-01-28 ENCOUNTER — Ambulatory Visit (INDEPENDENT_AMBULATORY_CARE_PROVIDER_SITE_OTHER): Payer: Medicare Other | Admitting: Family Medicine

## 2015-01-28 ENCOUNTER — Encounter: Payer: Self-pay | Admitting: Family Medicine

## 2015-01-28 VITALS — BP 122/80 | HR 96 | Ht 67.0 in | Wt 132.0 lb

## 2015-01-28 DIAGNOSIS — M810 Age-related osteoporosis without current pathological fracture: Secondary | ICD-10-CM | POA: Diagnosis not present

## 2015-01-28 DIAGNOSIS — Z23 Encounter for immunization: Secondary | ICD-10-CM | POA: Diagnosis not present

## 2015-01-28 DIAGNOSIS — G4700 Insomnia, unspecified: Secondary | ICD-10-CM

## 2015-01-28 DIAGNOSIS — K219 Gastro-esophageal reflux disease without esophagitis: Secondary | ICD-10-CM

## 2015-01-28 NOTE — Progress Notes (Signed)
   Subjective:    Patient ID: [name], female    DOB: 1945/12/14, 69 y.o.   MRN: [id number]  HPI F/U insomnia - we had weaned her off the lorazepam.  She is taking melatonin ans she is doing some better.  She is taking 5mg  daily in the evening.  She is also taking dramamine for her ear.  Doesn't feel groggy in the AM.  She feels the dramamine is helping her sleep too.  GERD- off her omeprazole for 1.5 months. She has limited herselt to one cup of coffe in the AM. She is using it PRN.    Osteoporosis - she is going to try the Prolia again with her oncologist.    Review of Systems     Objective:   Physical Exam  Constitutional: She is oriented to person, place, and time. She appears well-developed and well-nourished.  HENT:  Head: Normocephalic and atraumatic.  Cardiovascular: Normal rate, regular rhythm and normal heart sounds.   Pulmonary/Chest: Effort normal and breath sounds normal.  Neurological: She is alert and oriented to person, place, and time.  Skin: Skin is warm and dry.  Psychiatric: She has a normal mood and affect. Her behavior is normal.          Assessment & Plan:  Insomnia - ok to use the dramamine and the melatonin together. This seems to be working well and is not causing any excess sedation in the morning.Marland Kitchen    GERD - she is doing great off the PPI. Certainly she can use the medication as needed if she has a flare continue to focus on diet to control symptoms. Also if we need to we can consider an H2 blocker in place of the PPI if she still having frequent symptoms. She will let me know.  Osteoporosis - I think it's really worth retrying the Prolia again. I actually found in my experience is actually extremely well-tolerated so I'm hoping that she'll do well this next go around.  Given pneumoccoal 23 today.

## 2015-02-02 ENCOUNTER — Ambulatory Visit (HOSPITAL_BASED_OUTPATIENT_CLINIC_OR_DEPARTMENT_OTHER): Payer: Medicare Other

## 2015-02-02 VITALS — BP 128/61 | HR 81 | Temp 98.5°F

## 2015-02-02 DIAGNOSIS — M810 Age-related osteoporosis without current pathological fracture: Secondary | ICD-10-CM | POA: Diagnosis not present

## 2015-02-02 MED ORDER — DENOSUMAB 60 MG/ML ~~LOC~~ SOLN
60.0000 mg | Freq: Once | SUBCUTANEOUS | Status: AC
  Administered 2015-02-02: 60 mg via SUBCUTANEOUS
  Filled 2015-02-02: qty 1

## 2015-02-24 ENCOUNTER — Other Ambulatory Visit: Payer: Self-pay | Admitting: *Deleted

## 2015-02-24 MED ORDER — HYDROCODONE-ACETAMINOPHEN 5-325 MG PO TABS: ORAL_TABLET | ORAL | Status: DC

## 2015-02-24 NOTE — Telephone Encounter (Signed)
Refill for hydrocodone .[name] [place]

## 2015-03-09 ENCOUNTER — Encounter: Payer: Self-pay | Admitting: Family Medicine

## 2015-03-09 ENCOUNTER — Ambulatory Visit (INDEPENDENT_AMBULATORY_CARE_PROVIDER_SITE_OTHER): Payer: Medicare Other | Admitting: Family Medicine

## 2015-03-09 VITALS — BP 134/77 | HR 125 | Ht 67.0 in | Wt 128.0 lb

## 2015-03-09 DIAGNOSIS — R000 Tachycardia, unspecified: Secondary | ICD-10-CM | POA: Diagnosis not present

## 2015-03-09 DIAGNOSIS — M4328 Fusion of spine, sacral and sacrococcygeal region: Secondary | ICD-10-CM | POA: Diagnosis not present

## 2015-03-09 DIAGNOSIS — S338XXA Sprain of other parts of lumbar spine and pelvis, initial encounter: Secondary | ICD-10-CM

## 2015-03-09 DIAGNOSIS — S39012A Strain of muscle, fascia and tendon of lower back, initial encounter: Secondary | ICD-10-CM | POA: Insufficient documentation

## 2015-03-09 DIAGNOSIS — M9903 Segmental and somatic dysfunction of lumbar region: Secondary | ICD-10-CM

## 2015-03-09 MED ORDER — METHOCARBAMOL 500 MG PO TABS: 500.0000 mg | ORAL_TABLET | Freq: Three times a day (TID) | ORAL | Status: DC | PRN

## 2015-03-09 NOTE — Progress Notes (Signed)
[name] is a 69 y.o. female who presents to [facility]  today for right back pain present for few days. She denies any injury. No fever, chills , NVD. She notes this pain is different for her central back pain. She tried norco and tramadol which helped. No radiating pain, weakness, or numbness.    Past Medical History  Diagnosis Date  . Hyperlipidemia   . Recurrent sinusitis   . Breast cancer 1999    T1N0 LEFT BREAST  . 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 Osteoporosis 03-04-2011     LAST BONE DENSITY 03-04-2011  . Trigeminal neuralgia   . Degenerative disc disease   . History of bladder infections     RECURRENT  . Thyroid disease      History  Substance Use Topics  . Smoking status: Never Smoker   . Smokeless tobacco: Never Used  . Alcohol Use: No   ROS as above  Medications reviewed. Current Outpatient Prescriptions  Medication Sig Dispense Refill  . azelastine (ASTELIN) 137 MCG/SPRAY nasal spray Place 2 sprays into the nose Twice daily.    . Calcium-Magnesium-Vitamin D (CALCIUM 1200+D3 PO) Take by mouth daily.     . carbamazepine (TEGRETOL-XR) 100 MG 12 hr tablet Take 2 tablets (200 mg total) by mouth 2 (two) times daily. 360 tablet 1  . conjugated estrogens (PREMARIN) vaginal cream Place 0.625 g vaginally daily. Apply a pea size amount into vagina every 2-3 days    . denosumab (PROLIA) 60 MG/ML SOLN injection Inject 60 mg into the skin every 6 (six) months. Administer in upper arm, thigh, or abdomen    . fish oil-omega-3 fatty acids 1000 MG capsule Take 2 g by mouth daily.      Marland Kitchen HYDROcodone-acetaminophen (NORCO/VICODIN) 5-325 MG per tablet TAKE ONE TABLET BY MOUTH EVERY 12 HOURS AS NEEDED FOR PAIN 60 tablet 0  . latanoprost (XALATAN) 0.005 % ophthalmic solution Place 1 drop into both eyes At bedtime.    Marland Kitchen letrozole (FEMARA) 2.5 MG tablet TAKE 1 BY MOUTH DAILY 90 tablet 3  .  levocetirizine (XYZAL) 5 MG tablet Take 5 mg by mouth daily.      Marland Kitchen levothyroxine (SYNTHROID, LEVOTHROID) 50 MCG tablet TAKE ONE TABLET BY MOUTH ONCE DAILY 90 tablet 1  . montelukast (SINGULAIR) 10 MG tablet Take 1 tablet (10 mg total) by mouth at bedtime. 180 tablet 1  . omeprazole (PRILOSEC) 20 MG capsule Take 1 capsule (20 mg total) by mouth 2 (two) times daily before a meal. (Patient taking differently: Take 20 mg by mouth daily as needed. ) 120 capsule 3  . pravastatin (PRAVACHOL) 40 MG tablet TAKE ONE TABLET BY MOUTH EVERY DAY 90 tablet 2  . senna (SENOKOT) 8.6 MG tablet Take 1 tablet by mouth at bedtime.    . traMADol (ULTRAM) 50 MG tablet Take 1 tablet (50 mg total) by mouth 3 (three) times daily as needed for severe pain. for pain 360 tablet 0  . triamterene-hydrochlorothiazide (MAXZIDE-25) 37.5-25 MG per tablet TAKE ONE TABLET BY MOUTH ONCE DAILY. 90 tablet 1  . methocarbamol (ROBAXIN) 500 MG tablet Take 1 tablet (500 mg total) by mouth every 8 (eight) hours as needed for muscle spasms. 90 tablet 0   No current facility-administered medications for this visit.    Exam:  BP 134/77 mmHg  Pulse 125  Ht 5\' 7"  (1.702 m)  Wt 128 lb (58.06  kg)  BMI 20.04 kg/m2 Gen: Well NAD HEENT: EOMI,  MMM Lungs: CTABL Nl WOB Heart: Mild tach no MRG Abd: NABS, NT, ND Exts: Non edematous BL  LE, warm and well perfused.  Back: Nontender central midline. TTP right SI joint. Pain with FABER.  Hip. Nontender. Normal ROM 4/5 hip abduction strength.  Normal reflex BL.  Normal Gait.  Lumbar ROM decreased flexion.   No results found for this or any previous visit (from the past 72 hour(s)).

## 2015-03-09 NOTE — Patient Instructions (Signed)
Thank you for coming in today. Attend Physical Therapy.  Take robaxin if it helps.  Come back in 3-4 weeks.

## 2015-03-09 NOTE — Assessment & Plan Note (Signed)
Si joint strain. Plan for PT and robaxin. Continue narcotics PRN. Return in 1 month PRN.

## 2015-03-09 NOTE — Assessment & Plan Note (Signed)
Mild increase above chronic baseline. Likely due to pain. Plan for watchful waiting.

## 2015-03-11 ENCOUNTER — Encounter: Payer: Self-pay | Admitting: Physical Therapy

## 2015-03-11 ENCOUNTER — Ambulatory Visit (INDEPENDENT_AMBULATORY_CARE_PROVIDER_SITE_OTHER): Payer: Medicare Other | Admitting: Physical Therapy

## 2015-03-11 DIAGNOSIS — M6281 Muscle weakness (generalized): Secondary | ICD-10-CM

## 2015-03-11 DIAGNOSIS — R293 Abnormal posture: Secondary | ICD-10-CM

## 2015-03-11 DIAGNOSIS — R29898 Other symptoms and signs involving the musculoskeletal system: Secondary | ICD-10-CM

## 2015-03-11 DIAGNOSIS — M5410 Radiculopathy, site unspecified: Secondary | ICD-10-CM

## 2015-03-11 DIAGNOSIS — M545 Low back pain, unspecified: Secondary | ICD-10-CM

## 2015-03-11 DIAGNOSIS — M5418 Radiculopathy, sacral and sacrococcygeal region: Secondary | ICD-10-CM

## 2015-03-11 NOTE — Patient Instructions (Signed)
[facility] 336- [id number] Isometric Abdominal   Lying on back with knees bent, tighten stomach by pressing elbows down. Hold __5__ seconds. Repeat _10___ times per set. Do __1__ sets per session. Do _1_ sessions per day.   Hip External Rotation With Pillow: Transverse Plane Stability   One knee bent, one leg straight, on pillow. Slowly roll bent knee out. Be sure pelvis does not rotate. Do _10__ times. Restabilize pelvis. Repeat with other leg. Do _1__ sets, __1_ times per day.  Knee to Chest With 4-6 Inch Lift   Don't have to use the block. Bring one knee up, then return. Keep pelvis still. Be sure pelvis does not tilt forward or backward or roll side to side. Lift knee _10__ times. Restabilize pelvis. Repeat with other knee. Do _1__ sets, __1_ times per day.  Pelvic Press   Place hands under belly between navel and pubic bone, palms up. Feel pressure on hands. Increase pressure on hands by pressing pelvis down. This is NOT a pelvic tilt. Hold _5__ seconds. Relax. Repeat _10__ times.  Self-Mobilization: Knee Flexion (Prone)   Perform pelvic press. Bring both heels toward buttocks as close as possible, then straighten them without losing the pelvic press. Hold __1-2__ seconds. Relax. Repeat _10___ times per set. Do __1__ sets per session. Do 1___ sessions per day  Copyright  VHI. All rights reserved.

## 2015-03-11 NOTE — Therapy (Signed)
[facility] [place], [place], [address] Phone: [phone]   Fax:  [phone]  Physical Therapy Evaluation  Patient Details  Name: [name] MRN: [id number] Date of Birth: 09-May-1946 Referring Provider:  Gregor Hams, MD  Encounter Date: 03/11/2015      PT End of Session - 03/11/15 1018    Visit Number 1   Number of Visits 10   Date for PT Re-Evaluation 04/15/15   PT Start Time 8937   PT Stop Time 3428   PT Time Calculation (min) 56 min   Activity Tolerance Patient tolerated treatment well;No increased pain      Past Medical History  Diagnosis Date  . Hyperlipidemia   . Recurrent sinusitis   . Breast cancer 1999    T1N0 LEFT BREAST  . 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 Osteoporosis 03-04-2011     LAST BONE DENSITY 03-04-2011  . Trigeminal neuralgia   . Degenerative disc disease   . History of bladder infections     RECURRENT  . Thyroid disease     Past Surgical History  Procedure Laterality Date  . Bladder suspension  2005  . Left breast lumpectomy  1999  . Cancerous ln removed  2008  . Breast surgery  1999    Lumpectomy L Br  . Breast surgery  2007    Lymph Node Removal L Chest Wall  . Abdominal hysterectomy  2005    Complete    There were no vitals filed for this visit.  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  She had some discomfort in her low back SIJ after this and it got progressively worse.    Pertinent History using a muscle relaxer 2 times a day and it helps  some.    How long can you sit comfortably? tolerates < 38mn   How long can you stand comfortably? ~ 10 minutes   How long can you walk comfortably? hasn't been walking since this flared up   Diagnostic tests none   Patient Stated Goals returning to walking 20-30' walking a day, sleep without pain.    Currently in Pain? Yes   Pain Score 4    Pain Location Back   Pain Orientation Right   Pain Descriptors / Indicators Aching   Pain Type Acute pain   Pain Onset 1 to 4 weeks ago   Pain Frequency Constant   Aggravating Factors  not sure because it's constant   Pain Relieving Factors muscle relaxer, ice            [facility] PT Assessment - 03/11/15 0001    Assessment   Medical Diagnosis SI dysfunction   Onset Date/Surgical Date 02/23/15   Next MD Visit 04/02/15   Prior Therapy none   Precautions   Precautions --  osteoporosis   Balance Screen   Has the patient fallen in the past 6 months No   Has the patient had a decrease in activity level because of a fear  of falling?  Yes  due to pain   Is the patient reluctant to leave their home because of a fear of falling?  No   Home Environment   Living Environment Private residence   Additional Comments limiting her stair usage at home   Prior Function   Level of [place] Retired   Leisure walking, housework   Observation/Other Assessments   Focus on Therapeutic Outcomes (FOTO)  60% limited   Posture/Postural Control   Posture/Postural Control Postural limitations   Postural Limitations Decreased lumbar lordosis;Increased thoracic kyphosis   Posture Comments supine Rt LE longer than Lt    ROM / Strength   AROM / PROM / Strength Strength;AROM   AROM   Overall AROM Comments Lumbar ROM NA d/t osteoporosis dx.   bilat LE's WNL   AROM Assessment Site --  LE's WNL   Strength   Overall Strength Comments knees WNL   Strength Assessment Site Hip   Right/Left Hip Right;Left   Right Hip Flexion --  5-/5    Right Hip Extension --  5-/5   Right Hip ABduction 5/5   Left Hip Flexion 5/5   Left Hip Extension --  5-/5   Left Hip ABduction 5/5   Flexibility   Soft Tissue Assessment /Muscle Length --  LE'sWNL   Palpation   Palpation comment slight tenderness with sacral PA mobs, no pain with lumbar mobs, tight in bilat lumbar paraspinals and Rt QL   Special Tests    Special Tests Sacrolliac Tests   Sacroiliac Tests  Pelvic Compression   Pelvic Compression   Findings Negative   Side --  bilat   Bed Mobility   Bed Mobility --  I however slowly                   [facility] Adult PT Treatment/Exercise - 03/11/15 0001    Exercises   Exercises Lumbar   Lumbar Exercises: Supine   Ab Set 10 reps;5 seconds  pt had to rest as she developed spasm on Rt side.    Clam 10 reps  each leg in hooklying with TA contraction   Bent Knee Raise 10 reps  with TA contraction and VC's   Lumbar Exercises: Prone   Other Prone Lumbar Exercises pelvic press and press with bilat knee flex/ext   Modalities   Modalities Cryotherapy;Electrical Stimulation   Cryotherapy   Number Minutes Cryotherapy 15 Minutes   Cryotherapy Location Lumbar Spine  and SIJ   Type of Cryotherapy Ice pack   Electrical Stimulation   Electrical Stimulation Location lumbar and SIJ   Electrical Stimulation Action IFC   Electrical Stimulation Parameters to tolerance   Electrical Stimulation Goals Pain   Manual Therapy   Manual Therapy Muscle Energy Technique   Manual therapy comments technique performed twice and pt taught home technique   Muscle Energy Technique rotate Rt illium Back   pt in better alignment after, went out again with walking,                PT Education - 03/11/15 1103    Education provided Yes   Education Details HEP    Person(s) Educated Patient   Methods Explanation;Handout   Comprehension Returned demonstration;Verbalized understanding             PT Long Term Goals - 03/11/15  1535    PT LONG TERM GOAL #1   Title I with advanced HEP ( 04/15/15)   Time 5  Period Weeks   Status New   PT LONG TERM GOAL #2   Title report pain decrease =/> 75% with sitting ( 04/15/15)   Time 5   Period Weeks   Status Partially Met   PT LONG TERM GOAL #3   Title perform core stability ther ex without difficulty ( 04/15/15)   Time 5   Period Weeks   Status New   PT LONG TERM GOAL #4   Title improve FOTO =/< CJ level ( 04/15/15)   Time 5   Period Weeks   Status New   PT LONG TERM GOAL #5   Title maintain equal pelvic alignment =/> 1 wk ( 04/15/15)   Time 5   Period Weeks   Status New               Plan - 03/11/15 1406    Clinical Impression Statement 69 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  The pain has persisted and she sought medical attention.  She is using muscle relaxers to help manage the pain.  She has deep core weakness.  Pt is osteoporotic and this places her at risk of slilent compression fx.    Pt will benefit from skilled therapeutic intervention in order to improve on the following deficits Decreased mobility;Decreased strength;Postural dysfunction;Pain;Increased muscle spasms   Rehab Potential Good   Clinical Impairments Affecting Rehab Potential osteoporosis   PT Frequency 2x / week   PT Duration --  5 weeks   PT Treatment/Interventions Passive range of motion;Patient/family education;Electrical Stimulation;Cryotherapy;Neuromuscular re-education;Manual techniques;Taping;Therapeutic exercise;Ultrasound;Moist Heat   PT Next Visit Plan progress lumbar stab ex.          Problem List Patient Active Problem List   Diagnosis Date Noted  . Lumbosacral strain 03/09/2015  . Tachycardia 03/09/2015  . 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 Nipple soreness 11/16/2014   . Recurrent sinusitis   . Breast cancer   . History of bladder infections   . Thyroid disease   . Unspecified hypothyroidism 11/13/2013  . Trigeminal neuralgia pain 02/08/2011  . Breast carcinoma, left.  Original resection 1999. 01/28/2011  . ALLERGIC CONJUNCTIVITIS 11/27/2009  . GERD 10/07/2009  . VERTIGO 07/24/2009  . NECK PAIN 05/21/2009  . ALLERGIC RHINITIS 09/26/2008  . HYPERLIPIDEMIA, MIXED 12/20/2007  . NEURALGIA, TRIGEMINAL 12/20/2007  . UNSPECIFIED GLAUCOMA 12/20/2007  . DEGENERATIVE DISC DISEASE 12/20/2007  . Osteoporosis 12/20/2007  . Cancer 03/29/2006    Jeral Pinch, PT 03/11/2015, 3:42 PM  [facility] [place], [place], [address] Phone: [phone]   Fax:  [phone]

## 2015-03-17 ENCOUNTER — Ambulatory Visit (INDEPENDENT_AMBULATORY_CARE_PROVIDER_SITE_OTHER): Payer: Medicare Other | Admitting: Physical Therapy

## 2015-03-17 ENCOUNTER — Encounter: Payer: Self-pay | Admitting: Physical Therapy

## 2015-03-17 DIAGNOSIS — M545 Low back pain, unspecified: Secondary | ICD-10-CM

## 2015-03-17 DIAGNOSIS — R293 Abnormal posture: Secondary | ICD-10-CM

## 2015-03-17 DIAGNOSIS — R29898 Other symptoms and signs involving the musculoskeletal system: Secondary | ICD-10-CM

## 2015-03-17 DIAGNOSIS — M6281 Muscle weakness (generalized): Secondary | ICD-10-CM

## 2015-03-17 NOTE — Patient Instructions (Signed)
Leg Lengthener / Leg Press Combo: Single Leg  [place] [phone]   Straighten one leg down to floor. Pull toes AND forefoot toward knee; extend heel. Lengthen leg by pulling pelvis away from ribs. Press leg down. DO NOT BEND KNEE. Hold _3__ seconds. Relax leg. Repeat exercise 1 time. Relax leg. Re-bend knee. Repeat with other leg.  Lower Trunk Rotation Stretch   Keeping back flat and feet together, rotate knees to left side. Hold _2-3___ seconds. Repeat _1___ times per set. Do __1__ sets per session. Do _1-2___ sessions per day.  Copyright  VHI. All rights reserved.

## 2015-03-17 NOTE — Therapy (Signed)
[facility] [place], [place], [address] Phone: [phone]   Fax:  [phone]  Physical Therapy Treatment  Patient Details  Name: [name] MRN: [id number] Date of Birth: 11-09-1945 Referring Provider:  Gregor Hams, MD  Encounter Date: 03/17/2015      PT End of Session - 03/17/15 1024    Visit Number 2   Number of Visits 10   Date for PT Re-Evaluation 04/15/15   PT Start Time 0762   PT Stop Time 1118   PT Time Calculation (min) 57 min      Past Medical History  Diagnosis Date  . Hyperlipidemia   . Recurrent sinusitis   . Breast cancer 1999    T1N0 LEFT BREAST  . 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 Osteoporosis 03-04-2011     LAST BONE DENSITY 03-04-2011  . Trigeminal neuralgia   . Degenerative disc disease   . History of bladder infections     RECURRENT  . Thyroid disease     Past Surgical History  Procedure Laterality Date  . Bladder suspension  2005  . Left breast lumpectomy  1999  . Cancerous ln removed  2008  . Breast surgery  1999    Lumpectomy L Br  . Breast surgery  2007    Lymph Node Removal L Chest Wall  . Abdominal hysterectomy  2005    Complete    There were no vitals filed for this visit.  Visit Diagnosis:  Pain of lumbar spine  Weakness of back  Abnormal posture      Subjective Assessment - 03/17/15 1020    Subjective Pt reports some improvement, no more pain into the Rt hip, now only in low back. Not sure if she is performing her HEP correctly.    Currently in Pain? Yes   Pain Score 3    Pain Location Back   Pain Orientation Right   Pain Descriptors / Indicators Aching   Pain Type Acute pain   Pain Onset More than a month ago   Pain Frequency Intermittent   Aggravating Factors  HEP seems to aggrivate the pain   Pain Relieving Factors heat, ice and medication            [facility] PT Assessment - 03/17/15  0001    Assessment   Medical Diagnosis SI dysfunction   Onset Date/Surgical Date 02/23/15   Next MD Visit 04/02/15   Posture/Postural Control   Posture/Postural Control No significant limitations   Postural Limitations --  good alignment of her pelvis.                       [place] Adult PT Treatment/Exercise - 03/17/15 0001    Lumbar Exercises: Stretches   Active Hamstring Stretch 30 seconds;2 reps   Lower Trunk Rotation --  10 reps to each side   ITB Stretch 2 reps;30 seconds  cross body stretch with strap   Lumbar Exercises: Supine   Other Supine Lumbar Exercises PPT x 10   Other Supine Lumbar Exercises leg lengthener x 10 each side   Lumbar Exercises: Quadruped   [place] 10 reps   Modalities   Modalities Ultrasound;Cryotherapy;Electrical Stimulation   Cryotherapy   Number Minutes Cryotherapy 15 Minutes   Cryotherapy Location Lumbar Spine   Type of Cryotherapy Ice pack   Electrical Stimulation   Electrical Stimulation Location lumbar and SIJ  Electrical Stimulation Action IFC   Electrical Stimulation Parameters to tolerance   Electrical Stimulation Goals Pain   Ultrasound   Ultrasound Location Rt QL   Ultrasound Parameters 100%, 3.3 mhz, 1.5 w/cm2   Ultrasound Goals Pain  tightness   Manual Therapy   Manual Therapy Soft tissue mobilization;Taping   Manual therapy comments dynamic tape to RT QL   Soft tissue mobilization to Rt QL                PT Education - 03/17/15 1046    Education provided Yes   Education Details HEP   Person(s) Educated Patient   Methods Explanation;Handout   Comprehension Returned demonstration             PT Long Term Goals - 03/17/15 1024    PT LONG TERM GOAL #1   Title I with advanced HEP ( 04/15/15)   Status On-going   PT LONG TERM GOAL #2   Title report pain decrease =/> 75% with sitting ( 04/15/15)  not a problem anymore   Status Achieved   PT LONG TERM GOAL #3   Title perform core stability  ther ex without difficulty ( 04/15/15)   Status On-going   PT LONG TERM GOAL #4   Title improve FOTO =/< CJ level ( 04/15/15)   Status On-going   PT LONG TERM GOAL #5   Title maintain equal pelvic alignment =/> 1 wk ( 04/15/15)   Status On-going               Problem List Patient Active Problem List   Diagnosis Date Noted  . Lumbosacral strain 03/09/2015  . Tachycardia 03/09/2015  . 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 Nipple soreness 11/16/2014  . Recurrent sinusitis   . Breast cancer   . History of bladder infections   . Thyroid disease   . Unspecified hypothyroidism 11/13/2013  . Trigeminal neuralgia pain 02/08/2011  . Breast carcinoma, left.  Original resection 1999. 01/28/2011  . ALLERGIC CONJUNCTIVITIS 11/27/2009  . GERD 10/07/2009  . VERTIGO 07/24/2009  . NECK PAIN 05/21/2009  . ALLERGIC RHINITIS 09/26/2008  . HYPERLIPIDEMIA, MIXED 12/20/2007  . NEURALGIA, TRIGEMINAL 12/20/2007  . UNSPECIFIED GLAUCOMA 12/20/2007  . DEGENERATIVE DISC DISEASE 12/20/2007  . Osteoporosis 12/20/2007  . Cancer 03/29/2006    Jeral Pinch, PT 03/17/2015, 12:11 PM  [facility] [place], [place], [address] Phone: [phone]   Fax:  [phone]

## 2015-03-18 ENCOUNTER — Ambulatory Visit (INDEPENDENT_AMBULATORY_CARE_PROVIDER_SITE_OTHER): Payer: Medicare Other | Admitting: Adult Health

## 2015-03-18 ENCOUNTER — Encounter: Payer: Self-pay | Admitting: Adult Health

## 2015-03-18 VITALS — BP 121/73 | HR 79 | Ht 67.0 in | Wt 131.0 lb

## 2015-03-18 DIAGNOSIS — G500 Trigeminal neuralgia: Secondary | ICD-10-CM | POA: Diagnosis not present

## 2015-03-18 DIAGNOSIS — Z5181 Encounter for therapeutic drug level monitoring: Secondary | ICD-10-CM

## 2015-03-18 NOTE — Progress Notes (Signed)
I have read the note, and I agree with the clinical assessment and plan.  Luc Shammas KEITH   

## 2015-03-18 NOTE — Patient Instructions (Signed)
Continue Carbamazepine  I will check blood work today.  If your symptoms worsen or you develop new symptoms please let us know.

## 2015-03-18 NOTE — Progress Notes (Signed)
PATIENT: [name] DOB: 07/18/1946  REASON FOR VISIT: follow up- trigeminal neuralgia HISTORY FROM: patient  HISTORY OF PRESENT ILLNESS: Miss. Is a 69 year old female with a history of trigeminal neuralgia. She returns today for follow-up. The patient continues to take Tegretol-XR 200 mg twice a day. She states that this continues to control her pain. Denies any facial pain. Denies trouble with her gait or balance. Patient is currently in physical therapy for a hip injury. She states that this is going well. Denies any new neurological symptoms. She returns today for an evaluation. HISTORY 07/18/14: [name] is a 69 year old female with a history of trigeminal neuralgia. She returns today for follow-up. She is currently taking Tegretol XR 200 mg BID. She reports that this has been beneficial. Denies any facial pain. No new neurological complaints. No new medical history.  HISTORY 01/02/14 [facility]): [name] is a 69 year old right-handed Caucasian female, alone at today's clinical visit.Last seen in January 2014 by Hoyle Sauer. 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 She presenting with recurrent episode of left facial discomfort, the initial episode was in 2002, following a left upper molar filling procedure, she described left facial fullness, achy sensation, constant annoying pain, she was evaluated by [facility] neurologist Dr.Hayworth at that time, reported normal MRI of the brain, left facial pain last about 6-7 months, resolved by taking tegretol. The pain was at the left cheek area. She even underwent left upper molar extraction then. Since December 2010, she was treated with multiple rounds of antibiotic and prednisone for presumed sinus infection, because of left facial fullness sensation again, annoying pain  in the left cheek V2 distribtion, very similar to what she experienced in 2002, no shooting pain, no hypersensitivity, no rash. No left facial weakness, or hearing changes. She recently had CT of the sinus that was normal, there is no evidence of sinusitis. MRI brain was essentially normal, there is only mild nonspecific small vessel disease. She was restarted on tegretol since 2011, which has been very helpful, she is taking carbamazepine XR 100mg , 2 tablet twice a day tabs bid, She is also on polypharmacy treatment, including Fosamax, calcium, vitamin D, Premarin, hydrocodone, Ativan, Pravachol, tramadol, Maxzide. She is also taking, Letrozole UPDATE May 7th 2015:If she forgot to take tegretol xr 100mg  ii bid, she does noticed recurrent of her left facial pain, she does not have significant side effect from the medications,    REVIEW OF SYSTEMS: Out of a complete 14 system review of symptoms, the patient complains only of the following symptoms, and all other reviewed systems are negative.  Back pain  ALLERGIES: Allergies  Allergen Reactions  . Other Rash  . Ampicillin   . Cefadroxil     REACTION: rash  . Doxycycline Itching  . Simvastatin Other (See Comments)    Aches and memory loss  . Dicloxacillin Rash    HOME MEDICATIONS: Outpatient Prescriptions Prior to Visit  Medication Sig Dispense Refill  . azelastine (ASTELIN) 137 MCG/SPRAY nasal spray Place 2 sprays into the nose Twice daily.    . Calcium-Magnesium-Vitamin D (CALCIUM 1200+D3 PO) Take by mouth daily.     . carbamazepine (TEGRETOL-XR) 100 MG 12 hr tablet Take 2 tablets (200 mg total) by mouth 2 (two) times daily. 360 tablet 1  . conjugated estrogens (PREMARIN) vaginal cream Place  0.625 g vaginally daily. Apply a pea size amount into vagina every 2-3 days    . denosumab (PROLIA) 60 MG/ML SOLN injection Inject 60 mg into the skin every 6 (six) months. Administer in upper arm, thigh, or abdomen    . fish oil-omega-3 fatty acids  1000 MG capsule Take 2 g by mouth daily.      Marland Kitchen HYDROcodone-acetaminophen (NORCO/VICODIN) 5-325 MG per tablet TAKE ONE TABLET BY MOUTH EVERY 12 HOURS AS NEEDED FOR PAIN 60 tablet 0  . latanoprost (XALATAN) 0.005 % ophthalmic solution Place 1 drop into both eyes At bedtime.    Marland Kitchen letrozole (FEMARA) 2.5 MG tablet TAKE 1 BY MOUTH DAILY 90 tablet 3  . levocetirizine (XYZAL) 5 MG tablet Take 5 mg by mouth daily.      Marland Kitchen levothyroxine (SYNTHROID, LEVOTHROID) 50 MCG tablet TAKE ONE TABLET BY MOUTH ONCE DAILY 90 tablet 1  . methocarbamol (ROBAXIN) 500 MG tablet Take 1 tablet (500 mg total) by mouth every 8 (eight) hours as needed for muscle spasms. 90 tablet 0  . montelukast (SINGULAIR) 10 MG tablet Take 1 tablet (10 mg total) by mouth at bedtime. 180 tablet 1  . omeprazole (PRILOSEC) 20 MG capsule Take 1 capsule (20 mg total) by mouth 2 (two) times daily before a meal. (Patient taking differently: Take 20 mg by mouth daily as needed. ) 120 capsule 3  . pravastatin (PRAVACHOL) 40 MG tablet TAKE ONE TABLET BY MOUTH EVERY DAY 90 tablet 2  . senna (SENOKOT) 8.6 MG tablet Take 1 tablet by mouth at bedtime.    . traMADol (ULTRAM) 50 MG tablet Take 1 tablet (50 mg total) by mouth 3 (three) times daily as needed for severe pain. for pain 360 tablet 0  . triamterene-hydrochlorothiazide (MAXZIDE-25) 37.5-25 MG per tablet TAKE ONE TABLET BY MOUTH ONCE DAILY. 90 tablet 1   No facility-administered medications prior to visit.    PAST MEDICAL HISTORY: Past Medical History  Diagnosis Date  . Hyperlipidemia   . Recurrent sinusitis   . Breast cancer 1999    T1N0 LEFT BREAST  . 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 Osteoporosis 03-04-2011     LAST BONE DENSITY 03-04-2011  . Trigeminal neuralgia   . Degenerative disc disease   . History of bladder infections     RECURRENT  . Thyroid disease     PAST SURGICAL HISTORY: Past Surgical History  Procedure Laterality  Date  . Bladder suspension  2005  . Left breast lumpectomy  1999  . Cancerous ln removed  2008  . Breast surgery  1999    Lumpectomy L Br  . Breast surgery  2007    Lymph Node Removal L Chest Wall  . Abdominal hysterectomy  2005    Complete    FAMILY HISTORY: Family History  Problem Relation Age of Onset  . Cancer Mother     breast  . Stroke Mother   . Hyperlipidemia Mother   . Cancer Father     bladder  . Cancer Son     Breast  . Cancer Maternal Aunt     Breast  . Stomach cancer Paternal Uncle   . Cancer Paternal Grandmother     Mouth  . Stomach cancer Paternal Uncle     SOCIAL HISTORY: History   Social History  . Marital Status: Married    Spouse Name: [name]   . Number of Children: 3  . Years of  Education: RN   Occupational History  .      Retired- Therapist, sports   Social History Main Topics  . Smoking status: Never Smoker   . Smokeless tobacco: Never Used  . Alcohol Use: No  . Drug Use: No  . Sexual Activity: Not on file     Comment: housewife, married, 3 kids.   Other Topics Concern  . Not on file   Social History Narrative   Patient lives at home with her husband [name])   Retired - RN   Right handed.   Caffeine two cups coffee daily and one sweet tea.      PHYSICAL EXAM  Filed Vitals:   03/18/15 0917  BP: 121/73  Pulse: 79  Height: 5\' 7"  (1.702 m)  Weight: 131 lb (59.421 kg)   Body mass index is 20.51 kg/(m^2).  Generalized: Well developed, in no acute distress   Neurological examination  Mentation: Alert oriented to time, place, history taking. Follows all commands speech and language fluent Cranial nerve II-XII: Pupils were equal round reactive to light. Extraocular movements were full, visual field were full on confrontational test. Facial sensation and strength were normal. Uvula tongue midline. Head turning and shoulder shrug  were normal and symmetric. Motor: The motor testing reveals 5 over 5 strength of all 4 extremities. Good  symmetric motor tone is noted throughout.  Sensory: Sensory testing is intact to soft touch on all 4 extremities. No evidence of extinction is noted.  Coordination: Cerebellar testing reveals good finger-nose-finger and heel-to-shin bilaterally.  Gait and station: Gait is normal. Tandem gait is normal. Romberg is negative. No drift is seen.  Reflexes: Deep tendon reflexes are symmetric and normal bilaterally.   DIAGNOSTIC DATA (LABS, IMAGING, TESTING) - I reviewed patient records, labs, notes, testing and imaging myself where available.  Lab Results  Component Value Date   WBC 4.2 11/14/2014   HGB 12.8 11/14/2014   HCT 39.6 11/14/2014   MCV 94.0 11/14/2014   PLT 217 11/14/2014      Component Value Date/Time   NA 141 11/14/2014 1338   NA 141 07/16/2014 1021   K 4.1 11/14/2014 1338   K 4.2 07/16/2014 1021   CL 100 07/16/2014 1021   CL 98 10/02/2012 1125   CO2 30* 11/14/2014 1338   CO2 31 07/16/2014 1021   GLUCOSE 98 11/14/2014 1338   GLUCOSE 78 07/16/2014 1021   GLUCOSE 80 10/02/2012 1125   BUN 15.8 11/14/2014 1338   BUN 20 07/16/2014 1021   CREATININE 0.9 11/14/2014 1338   CREATININE 0.78 07/16/2014 1021   CREATININE 0.72 01/11/2012 0922   CALCIUM 9.8 11/14/2014 1338   CALCIUM 9.7 07/16/2014 1021   PROT 6.6 11/14/2014 1338   PROT 6.6 07/16/2014 1021   ALBUMIN 4.3 11/14/2014 1338   ALBUMIN 4.7 07/16/2014 1021   AST 22 11/14/2014 1338   AST 22 07/16/2014 1021   ALT 15 11/14/2014 1338   ALT 16 07/16/2014 1021   ALKPHOS 35* 11/14/2014 1338   ALKPHOS 40 07/16/2014 1021   BILITOT 0.24 11/14/2014 1338   BILITOT 0.4 07/16/2014 1021   GFRNONAA >89 11/13/2013 1031   GFRAA >89 11/13/2013 1031    ASSESSMENT AND PLAN 69 y.o. year old female  has a past medical history of Hyperlipidemia; Recurrent sinusitis; Breast cancer (1999); Cancer (03-2006); Osteoporosis (03-04-2011); Trigeminal neuralgia; Degenerative disc disease; History of bladder infections; and Thyroid disease. here  with:  1. Trigeminal neuralgia  Overall the patient is doing well. She will continue  Tegretol-XR 200 mg twice a day. I will check a carbamazepine level today. Excellent if her symptoms worsen or she develops new symptoms she should let us know. She will follow-up in one year or sooner if needed.  Ward Givens, MSN, NP-C 03/18/2015, 9:30 AM [facility] [address] [place], [place] [address] [phone]  Note: This document was prepared with digital dictation and possible smart phrase technology. Any transcriptional errors that result from this process are unintentional.

## 2015-03-19 ENCOUNTER — Encounter: Payer: Self-pay | Admitting: Physical Therapy

## 2015-03-19 ENCOUNTER — Telehealth: Payer: Self-pay

## 2015-03-19 ENCOUNTER — Ambulatory Visit (INDEPENDENT_AMBULATORY_CARE_PROVIDER_SITE_OTHER): Payer: Medicare Other | Admitting: Physical Therapy

## 2015-03-19 DIAGNOSIS — M545 Low back pain, unspecified: Secondary | ICD-10-CM

## 2015-03-19 DIAGNOSIS — M6281 Muscle weakness (generalized): Secondary | ICD-10-CM | POA: Diagnosis not present

## 2015-03-19 DIAGNOSIS — R293 Abnormal posture: Secondary | ICD-10-CM | POA: Diagnosis not present

## 2015-03-19 DIAGNOSIS — R29898 Other symptoms and signs involving the musculoskeletal system: Secondary | ICD-10-CM

## 2015-03-19 LAB — CARBAMAZEPINE LEVEL, TOTAL: Carbamazepine Lvl: 4.9 ug/mL (ref 4.0–12.0)

## 2015-03-19 NOTE — Therapy (Signed)
[name], [place], [address] Phone: [phone]   Fax:  [phone]  Physical Therapy Treatment  Patient Details  Name: ABBIEGAIL Norman MRN: [id number] Date of Birth: 25-Aug-1946 Referring Provider:  Gregor Hams, MD  Encounter Date: 03/19/2015      PT End of Session - 03/19/15 1205    Visit Number 3   Number of Visits 10   Date for PT Re-Evaluation 04/15/15   PT Start Time 2376   PT Stop Time 1237   PT Time Calculation (min) 49 min      Past Medical History  Diagnosis Date  . Hyperlipidemia   . Recurrent sinusitis   . Breast cancer 1999    T1N0 LEFT BREAST  . 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 Osteoporosis 03-04-2011     LAST BONE DENSITY 03-04-2011  . Trigeminal neuralgia   . Degenerative disc disease   . History of bladder infections     RECURRENT  . Thyroid disease     Past Surgical History  Procedure Laterality Date  . Bladder suspension  2005  . Left breast lumpectomy  1999  . Cancerous ln removed  2008  . Breast surgery  1999    Lumpectomy L Br  . Breast surgery  2007    Lymph Node Removal L Chest Wall  . Abdominal hysterectomy  2005    Complete    There were no vitals filed for this visit.  Visit Diagnosis:  Pain of lumbar spine  Weakness of back  Abnormal posture      Subjective Assessment - 03/19/15 1151    Subjective Feeling pretty good   Currently in Pain? Yes   Pain Score 2    Pain Location Back   Pain Orientation Right   Pain Descriptors / Indicators Aching;Dull                         [facility] Adult PT Treatment/Exercise - 03/19/15 0001    Exercises   Exercises --  hip flexor stretches in low lunge x 30 sec   Lumbar Exercises: Stretches   Active Hamstring Stretch 1 rep;30 seconds  cross body with strap each side   ITB Stretch --  10 reps cross body with strap moving into abduction     Lumbar Exercises: Supine   Bridge Non-compliant;10 reps;5 seconds  feet on ball   Straight Leg Raise 20 reps  with PPT and opposite arm reaching to knee   Lumbar Exercises: Prone   Other Prone Lumbar Exercises 10 reps upper body lifts. arms at sides and T's   Other Prone Lumbar Exercises 5 reps each active assist prone quad stretch flat and on elbows   Cryotherapy   Number Minutes Cryotherapy 15 Minutes   Cryotherapy Location Lumbar Spine   Type of Cryotherapy Ice pack   Electrical Stimulation   Electrical Stimulation Location lumbar and SIJ   Electrical Stimulation Action IFC   Electrical Stimulation Parameters to tolerance   Electrical Stimulation Goals Pain   Manual Therapy   Manual Therapy Muscle Energy Technique   Muscle Energy Technique rotate Rt lilium forward                     PT Long Term Goals - 03/17/15 1024    PT LONG TERM GOAL #1   Title I with advanced HEP ( 04/15/15)  Status On-going   PT LONG TERM GOAL #2   Title report pain decrease =/> 75% with sitting ( 04/15/15)  not a problem anymore   Status Achieved   PT LONG TERM GOAL #3   Title perform core stability ther ex without difficulty ( 04/15/15)   Status On-going   PT LONG TERM GOAL #4   Title improve FOTO =/< CJ level ( 04/15/15)   Status On-going   PT LONG TERM GOAL #5   Title maintain equal pelvic alignment =/> 1 wk ( 04/15/15)   Status On-going               Plan - 03/19/15 1225    Clinical Impression Statement Pt having decreasd pain, overcorrected Rt ilium, this was repositioned to neutral with muscle energy technique.  Pt to stop performing Rt hip ext into bed.  Her QL is not as tight, dynamic tape still in place.  She has some tight hip flexors  and weak core.    Pt will benefit from skilled therapeutic intervention in order to improve on the following deficits Decreased mobility;Decreased strength;Postural dysfunction;Pain;Increased muscle spasms   Rehab Potential Good    Clinical Impairments Affecting Rehab Potential osteoporosis   PT Frequency 2x / week   PT Duration --  5 weeks   PT Treatment/Interventions Passive range of motion;Patient/family education;Electrical Stimulation;Cryotherapy;Neuromuscular re-education;Manual techniques;Taping;Therapeutic exercise;Ultrasound;Moist Heat   PT Next Visit Plan progress lumbar stab ex.    Consulted and Agree with Plan of Care Patient        Problem List Patient Active Problem List   Diagnosis Date Noted  . Lumbosacral strain 03/09/2015  . Tachycardia 03/09/2015  . 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 Nipple soreness 11/16/2014  . Recurrent sinusitis   . Breast cancer   . History of bladder infections   . Thyroid disease   . Unspecified hypothyroidism 11/13/2013  . Trigeminal neuralgia pain 02/08/2011  . Breast carcinoma, left.  Original resection 1999. 01/28/2011  . ALLERGIC CONJUNCTIVITIS 11/27/2009  . GERD 10/07/2009  . VERTIGO 07/24/2009  . NECK PAIN 05/21/2009  . ALLERGIC RHINITIS 09/26/2008  . HYPERLIPIDEMIA, MIXED 12/20/2007  . NEURALGIA, TRIGEMINAL 12/20/2007  . UNSPECIFIED GLAUCOMA 12/20/2007  . DEGENERATIVE DISC DISEASE 12/20/2007  . Osteoporosis 12/20/2007  . Cancer 03/29/2006    Jeral Pinch, PT 03/19/2015, 12:27 PM  [facility] [place], [place], [address] Phone: [phone]   Fax:  [phone]

## 2015-03-19 NOTE — Telephone Encounter (Signed)
-----   Message from Ward Givens, NP sent at 03/19/2015  7:35 AM EDT ----- Lab work is good.

## 2015-03-19 NOTE — Telephone Encounter (Signed)
Called and spoke to patient relayed normal labs. Patient understood.

## 2015-03-23 ENCOUNTER — Encounter: Payer: Self-pay | Admitting: Physical Therapy

## 2015-03-23 ENCOUNTER — Ambulatory Visit (INDEPENDENT_AMBULATORY_CARE_PROVIDER_SITE_OTHER): Payer: Medicare Other | Admitting: Physical Therapy

## 2015-03-23 DIAGNOSIS — R29898 Other symptoms and signs involving the musculoskeletal system: Secondary | ICD-10-CM

## 2015-03-23 DIAGNOSIS — M6281 Muscle weakness (generalized): Secondary | ICD-10-CM | POA: Diagnosis not present

## 2015-03-23 DIAGNOSIS — R293 Abnormal posture: Secondary | ICD-10-CM | POA: Diagnosis not present

## 2015-03-23 DIAGNOSIS — M545 Low back pain, unspecified: Secondary | ICD-10-CM

## 2015-03-23 DIAGNOSIS — M5410 Radiculopathy, site unspecified: Secondary | ICD-10-CM

## 2015-03-23 DIAGNOSIS — M5418 Radiculopathy, sacral and sacrococcygeal region: Secondary | ICD-10-CM

## 2015-03-23 NOTE — Therapy (Signed)
[facility], [place], [address] Phone: [phone]   Fax:  [phone]  Physical Therapy Treatment  Patient Details  Name: [name] MRN: [id number] Date of Birth: August 18, 1946 Referring Provider:  Gregor Hams, MD  Encounter Date: 03/23/2015      PT End of Session - 03/23/15 0842    Visit Number 4   Number of Visits 10   Date for PT Re-Evaluation 04/15/15   PT Start Time 0845   PT Stop Time 0950   PT Time Calculation (min) 65 min      Past Medical History  Diagnosis Date  . Hyperlipidemia   . Recurrent sinusitis   . Breast cancer 1999    T1N0 LEFT BREAST  . 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 Osteoporosis 03-04-2011     LAST BONE DENSITY 03-04-2011  . Trigeminal neuralgia   . Degenerative disc disease   . History of bladder infections     RECURRENT  . Thyroid disease     Past Surgical History  Procedure Laterality Date  . Bladder suspension  2005  . Left breast lumpectomy  1999  . Cancerous ln removed  2008  . Breast surgery  1999    Lumpectomy L Br  . Breast surgery  2007    Lymph Node Removal L Chest Wall  . Abdominal hysterectomy  2005    Complete    There were no vitals filed for this visit.  Visit Diagnosis:  Pain of lumbar spine  Weakness of back  Abnormal posture  Radicular pain of sacrum      Subjective Assessment - 03/23/15 0850    Subjective Pt reports she was feeling better so she went shopping on Saturday, getting in/out of car multiple times and has been sore since then .    Patient Stated Goals returning to walking 20-30' walking a day, sleep without pain.    Currently in Pain? Yes   Pain Score 2    Pain Location Back   Pain Orientation Right   Pain Descriptors / Indicators Sore   Pain Type Acute pain   Pain Frequency Constant   Aggravating Factors  increased activity   Pain Relieving Factors muscle  relaxer, ice                          [facility] Adult PT Treatment/Exercise - 03/23/15 0001    Lumbar Exercises: Stretches   Active Hamstring Stretch 1 rep;30 seconds  each side with strap   Single Knee to Chest Stretch 2 reps;30 seconds  each side   Quad Stretch 2 reps;30 seconds  prone with strap   ITB Stretch 2 reps;30 seconds   Piriformis Stretch 2 reps;20 seconds   Lumbar Exercises: Aerobic   Stationary Bike Nustep L3 x 5'  VC's for form   Lumbar Exercises: Supine   Ab Set 10 reps  with moving into table top position   Bridge 20 reps  articulating bridges.    Lumbar Exercises: Prone   Straight Leg Raise 20 reps  with pelvic press on each side   Opposite Arm/Leg Raise Right arm/Left leg;Left arm/Right leg;20 reps   Other Prone Lumbar Exercises pelvic press x 10   Modalities   Modalities Cryotherapy;Electrical Stimulation   Cryotherapy   Number Minutes Cryotherapy 15 Minutes   Cryotherapy Location Lumbar Spine   Type of Cryotherapy Ice  pack   Electrical Stimulation   Electrical Stimulation Location lumbar and SIJ   Electrical Stimulation Action IFC   Electrical Stimulation Parameters to tolerance   Electrical Stimulation Goals Pain   Manual Therapy   Manual Therapy Taping   Manual therapy comments dynamic tape to RT QL   Soft tissue mobilization to Rt QL                     PT Long Term Goals - 03/23/15 0902    PT LONG TERM GOAL #1   Title I with advanced HEP ( 04/15/15)   Status On-going   PT LONG TERM GOAL #2   Title report pain decrease =/> 75% with sitting ( 04/15/15)   Status Achieved   PT LONG TERM GOAL #3   Title perform core stability ther ex without difficulty ( 04/15/15)   Status On-going   PT LONG TERM GOAL #4   Title improve FOTO =/< CJ level ( 04/15/15)   Status On-going   PT LONG TERM GOAL #5   Title maintain equal pelvic alignment =/> 1 wk ( 04/15/15)   Status Achieved               Plan - 03/23/15 0903     Clinical Impression Statement Pt with pain flare up over the weekend when she increased her activity level.  She is staying in alignment through her pelvis.  Now its a matter of improving her core strength to support her spine and pelvis.    Pt will benefit from skilled therapeutic intervention in order to improve on the following deficits Decreased mobility;Decreased strength;Postural dysfunction;Pain;Increased muscle spasms   Rehab Potential Good   Clinical Impairments Affecting Rehab Potential osteoporosis   PT Duration --  5 wks   PT Treatment/Interventions Passive range of motion;Patient/family education;Electrical Stimulation;Cryotherapy;Neuromuscular re-education;Manual techniques;Taping;Therapeutic exercise;Ultrasound;Moist Heat   PT Next Visit Plan progress lumbar stab ex.    Consulted and Agree with Plan of Care Patient        Problem List Patient Active Problem List   Diagnosis Date Noted  . Lumbosacral strain 03/09/2015  . Tachycardia 03/09/2015  . 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 Nipple soreness 11/16/2014  . Recurrent sinusitis   . Breast cancer   . History of bladder infections   . Thyroid disease   . Unspecified hypothyroidism 11/13/2013  . Trigeminal neuralgia pain 02/08/2011  . Breast carcinoma, left.  Original resection 1999. 01/28/2011  . ALLERGIC CONJUNCTIVITIS 11/27/2009  . GERD 10/07/2009  . VERTIGO 07/24/2009  . NECK PAIN 05/21/2009  . ALLERGIC RHINITIS 09/26/2008  . HYPERLIPIDEMIA, MIXED 12/20/2007  . NEURALGIA, TRIGEMINAL 12/20/2007  . UNSPECIFIED GLAUCOMA 12/20/2007  . DEGENERATIVE DISC DISEASE 12/20/2007  . Osteoporosis 12/20/2007  . Cancer 03/29/2006    Jeral Pinch PT 03/23/2015, 9:40 AM  [facility] [place], [place], [address] Phone: [phone]   Fax:  [phone]

## 2015-03-26 ENCOUNTER — Ambulatory Visit (INDEPENDENT_AMBULATORY_CARE_PROVIDER_SITE_OTHER): Payer: Medicare Other | Admitting: Physical Therapy

## 2015-03-26 ENCOUNTER — Encounter: Payer: Self-pay | Admitting: Physical Therapy

## 2015-03-26 ENCOUNTER — Telehealth: Payer: Self-pay | Admitting: Oncology

## 2015-03-26 DIAGNOSIS — M5410 Radiculopathy, site unspecified: Secondary | ICD-10-CM

## 2015-03-26 DIAGNOSIS — M6281 Muscle weakness (generalized): Secondary | ICD-10-CM

## 2015-03-26 DIAGNOSIS — M545 Low back pain, unspecified: Secondary | ICD-10-CM

## 2015-03-26 DIAGNOSIS — M5418 Radiculopathy, sacral and sacrococcygeal region: Secondary | ICD-10-CM

## 2015-03-26 DIAGNOSIS — R293 Abnormal posture: Secondary | ICD-10-CM | POA: Diagnosis not present

## 2015-03-26 DIAGNOSIS — R29898 Other symptoms and signs involving the musculoskeletal system: Secondary | ICD-10-CM

## 2015-03-26 NOTE — Telephone Encounter (Signed)
Spoke with patient and she is aware of her new dr livesay appointment °

## 2015-03-26 NOTE — Therapy (Signed)
[facility], [place], [address] Phone: [phone]   Fax:  [phone]  Physical Therapy Treatment  Patient Details  Name: [name] MRN: [id number] Date of Birth: Dec 10, 1945 Referring Provider:  Gregor Hams, MD  Encounter Date: 03/26/2015      PT End of Session - 03/26/15 1009    Visit Number 5   Number of Visits 10   Date for PT Re-Evaluation 04/15/15   PT Start Time 1009   PT Stop Time 1116   PT Time Calculation (min) 67 min   Activity Tolerance Patient tolerated treatment well      Past Medical History  Diagnosis Date  . Hyperlipidemia   . Recurrent sinusitis   . Breast cancer 1999    T1N0 LEFT BREAST  . 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 Osteoporosis 03-04-2011     LAST BONE DENSITY 03-04-2011  . Trigeminal neuralgia   . Degenerative disc disease   . History of bladder infections     RECURRENT  . Thyroid disease     Past Surgical History  Procedure Laterality Date  . Bladder suspension  2005  . Left breast lumpectomy  1999  . Cancerous ln removed  2008  . Breast surgery  1999    Lumpectomy L Br  . Breast surgery  2007    Lymph Node Removal L Chest Wall  . Abdominal hysterectomy  2005    Complete    There were no vitals filed for this visit.  Visit Diagnosis:  Pain of lumbar spine  Weakness of back  Abnormal posture  Radicular pain of sacrum      Subjective Assessment - 03/26/15 1008    Subjective Pt was very sore after last treatment, it has settled down now   Currently in Pain? Yes  states its ok right now.    Pain Score 3    Pain Location Back   Pain Orientation Right   Pain Descriptors / Indicators Sore   Pain Type Acute pain                         [facility] Adult PT Treatment/Exercise - 03/26/15 0001    Lumbar Exercises: Stretches   Single Knee to Chest Stretch 1 rep;30 seconds  each  side   ITB Stretch --  10 reps cross body stetch with strap going into add/abductio   Lumbar Exercises: Aerobic   Stationary Bike Nustep L3 x 5'   Lumbar Exercises: Standing   Wall Slides --  6 reps with 5 reps UE horizontal abduction with y-t band     Lumbar Exercises: Supine   Ab Set 10 reps  10 sec holds   Other Supine Lumbar Exercises LTR using abdominals to bring knees up x 15   Lumbar Exercises: Sidelying   Other Sidelying Lumbar Exercises QL work, drawing hip away from ribs.    Lumbar Exercises: Prone   Straight Leg Raise 10 reps  each side with pelvic press   Other Prone Lumbar Exercises pelvic press x 10 10 sec holds   Other Prone Lumbar Exercises quad sets with toes curled under.    Modalities   Modalities Cryotherapy;Electrical Stimulation   Cryotherapy   Number Minutes Cryotherapy 15 Minutes   Cryotherapy Location Lumbar Spine   Type of Cryotherapy Ice pack   Electrical Stimulation   Electrical Stimulation Location lumbar and  SIJ   Electrical Stimulation Action IFC   Electrical Stimulation Parameters to tolerance   Electrical Stimulation Goals Pain   Manual Therapy   Manual Therapy Muscle Energy Technique   Muscle Energy Technique rotate Rt ilium back, was off ~ 1/2 inch                     PT Long Term Goals - 03/26/15 1359    PT LONG TERM GOAL #1   Title I with advanced HEP ( 04/15/15)   Status On-going   PT LONG TERM GOAL #2   Title report pain decrease =/> 75% with sitting ( 04/15/15)   Status Achieved   PT LONG TERM GOAL #3   Title perform core stability ther ex without difficulty ( 04/15/15)  pt fatigues and requires VC for form   Status On-going   PT LONG TERM GOAL #4   Title improve FOTO =/< CJ level ( 04/15/15)  CL level, scored 63% limited   Status On-going   PT LONG TERM GOAL #5   Title maintain equal pelvic alignment =/> 1 wk ( 04/15/15)   Status Achieved               Problem List Patient Active Problem List    Diagnosis Date Noted  . Lumbosacral strain 03/09/2015  . Tachycardia 03/09/2015  . 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 Nipple soreness 11/16/2014  . Recurrent sinusitis   . Breast cancer   . History of bladder infections   . Thyroid disease   . Unspecified hypothyroidism 11/13/2013  . Trigeminal neuralgia pain 02/08/2011  . Breast carcinoma, left.  Original resection 1999. 01/28/2011  . ALLERGIC CONJUNCTIVITIS 11/27/2009  . GERD 10/07/2009  . VERTIGO 07/24/2009  . NECK PAIN 05/21/2009  . ALLERGIC RHINITIS 09/26/2008  . HYPERLIPIDEMIA, MIXED 12/20/2007  . NEURALGIA, TRIGEMINAL 12/20/2007  . UNSPECIFIED GLAUCOMA 12/20/2007  . DEGENERATIVE DISC DISEASE 12/20/2007  . Osteoporosis 12/20/2007  . Cancer 03/29/2006    Jeral Pinch PT 03/26/2015, 2:02 PM  [facility] [place], [place], [address] Phone: [phone]   Fax:  [phone]

## 2015-03-27 ENCOUNTER — Other Ambulatory Visit: Payer: Self-pay | Admitting: *Deleted

## 2015-03-27 MED ORDER — HYDROCODONE-ACETAMINOPHEN 5-325 MG PO TABS: ORAL_TABLET | ORAL | Status: DC

## 2015-03-30 ENCOUNTER — Encounter: Payer: Self-pay | Admitting: Family Medicine

## 2015-03-30 ENCOUNTER — Other Ambulatory Visit: Payer: Self-pay | Admitting: Neurology

## 2015-03-30 ENCOUNTER — Ambulatory Visit (INDEPENDENT_AMBULATORY_CARE_PROVIDER_SITE_OTHER): Payer: Medicare Other | Admitting: Family Medicine

## 2015-03-30 VITALS — BP 122/74 | HR 114 | Wt 126.0 lb

## 2015-03-30 DIAGNOSIS — R000 Tachycardia, unspecified: Secondary | ICD-10-CM

## 2015-03-30 DIAGNOSIS — S338XXA Sprain of other parts of lumbar spine and pelvis, initial encounter: Secondary | ICD-10-CM | POA: Diagnosis not present

## 2015-03-30 DIAGNOSIS — S39012A Strain of muscle, fascia and tendon of lower back, initial encounter: Secondary | ICD-10-CM

## 2015-03-30 NOTE — Patient Instructions (Signed)
Thank you for coming in today. See if you can get a TENS unit. Continue physical therapy. We will try to order you a TENS unit Return as needed.  Come back or go to the emergency room if you notice new weakness new numbness problems walking or bowel or bladder problems.

## 2015-03-30 NOTE — Assessment & Plan Note (Signed)
Much better. Continue physical therapy. Order TENS unit. Return as needed.

## 2015-03-30 NOTE — Progress Notes (Signed)
[name] is a 69 y.o. female who presents to [facility]  today for follow-up back and hip pain. Patient was seen July 11 for back and hip pain. She is referred to physical therapy and is feeling much better. She suspects that she is about 90% better. She continues to have some mild pain. She responded very well to trial of yeast, but like to consider a TENS unit. She would like a few more physical therapy sessions before she finishes.   Past Medical History  Diagnosis Date  . Hyperlipidemia   . Recurrent sinusitis   . Breast cancer 1999    T1N0 LEFT BREAST  . 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 Osteoporosis 03-04-2011     LAST BONE DENSITY 03-04-2011  . Trigeminal neuralgia   . Degenerative disc disease   . History of bladder infections     RECURRENT  . Thyroid disease    Past Surgical History  Procedure Laterality Date  . Bladder suspension  2005  . Left breast lumpectomy  1999  . Cancerous ln removed  2008  . Breast surgery  1999    Lumpectomy L Br  . Breast surgery  2007    Lymph Node Removal L Chest Wall  . Abdominal hysterectomy  2005    Complete   History  Substance Use Topics  . Smoking status: Never Smoker   . Smokeless tobacco: Never Used  . Alcohol Use: No   ROS as above Medications: Current Outpatient Prescriptions  Medication Sig Dispense Refill  . azelastine (ASTELIN) 137 MCG/SPRAY nasal spray Place 2 sprays into the nose Twice daily.    . Calcium-Magnesium-Vitamin D (CALCIUM 1200+D3 PO) Take by mouth daily.     . carbamazepine (TEGRETOL XR) 200 MG 12 hr tablet TAKE ONE TABLET TWICE DAILY 180 tablet 3  . conjugated estrogens (PREMARIN) vaginal cream Place 0.625 g vaginally daily. Apply a pea size amount into vagina every 2-3 days    . denosumab (PROLIA) 60 MG/ML SOLN injection Inject 60 mg into the skin every 6 (six) months. Administer in upper arm, thigh, or abdomen    .  fish oil-omega-3 fatty acids 1000 MG capsule Take 2 g by mouth daily.      Marland Kitchen HYDROcodone-acetaminophen (NORCO/VICODIN) 5-325 MG per tablet TAKE ONE TABLET BY MOUTH EVERY 12 HOURS AS NEEDED FOR PAIN 60 tablet 0  . latanoprost (XALATAN) 0.005 % ophthalmic solution Place 1 drop into both eyes At bedtime.    Marland Kitchen letrozole (FEMARA) 2.5 MG tablet TAKE 1 BY MOUTH DAILY 90 tablet 3  . levocetirizine (XYZAL) 5 MG tablet Take 5 mg by mouth daily.      Marland Kitchen levothyroxine (SYNTHROID, LEVOTHROID) 50 MCG tablet TAKE ONE TABLET BY MOUTH ONCE DAILY 90 tablet 1  . methocarbamol (ROBAXIN) 500 MG tablet Take 1 tablet (500 mg total) by mouth every 8 (eight) hours as needed for muscle spasms. 90 tablet 0  . montelukast (SINGULAIR) 10 MG tablet Take 1 tablet (10 mg total) by mouth at bedtime. 180 tablet 1  . omeprazole (PRILOSEC) 20 MG capsule Take 1 capsule (20 mg total) by mouth 2 (two) times daily before a meal. (Patient taking differently: Take 20 mg by mouth daily as needed. ) 120 capsule 3  . pravastatin (PRAVACHOL) 40 MG tablet TAKE ONE TABLET BY MOUTH EVERY DAY 90 tablet 2  . senna (SENOKOT) 8.6 MG tablet Take 1 tablet by  mouth at bedtime.    . traMADol (ULTRAM) 50 MG tablet Take 1 tablet (50 mg total) by mouth 3 (three) times daily as needed for severe pain. for pain 360 tablet 0  . triamterene-hydrochlorothiazide (MAXZIDE-25) 37.5-25 MG per tablet TAKE ONE TABLET BY MOUTH ONCE DAILY. 90 tablet 1   No current facility-administered medications for this visit.   Allergies  Allergen Reactions  . Other Rash  . Ampicillin   . Cefadroxil     REACTION: rash  . Doxycycline Itching  . Simvastatin Other (See Comments)    Aches and memory loss  . Dicloxacillin Rash     Exam:  BP 122/74 mmHg  Pulse 114  Wt 126 lb (57.153 kg) Gen: Well NAD Back: Nontender normal gait Exts: Brisk capillary refill, warm and well perfused.   No results found for this or any previous visit (from the past 24 hour(s)). No results  found.   Please see individual assessment and plan sections.

## 2015-03-30 NOTE — Assessment & Plan Note (Signed)
At baseline follow-up with PCP for this issue.

## 2015-03-31 ENCOUNTER — Ambulatory Visit (INDEPENDENT_AMBULATORY_CARE_PROVIDER_SITE_OTHER): Payer: Medicare Other | Admitting: Physical Therapy

## 2015-03-31 ENCOUNTER — Encounter: Payer: Self-pay | Admitting: Physical Therapy

## 2015-03-31 DIAGNOSIS — M545 Low back pain, unspecified: Secondary | ICD-10-CM

## 2015-03-31 DIAGNOSIS — R293 Abnormal posture: Secondary | ICD-10-CM

## 2015-03-31 DIAGNOSIS — R29898 Other symptoms and signs involving the musculoskeletal system: Secondary | ICD-10-CM

## 2015-03-31 DIAGNOSIS — M6281 Muscle weakness (generalized): Secondary | ICD-10-CM | POA: Diagnosis not present

## 2015-03-31 NOTE — Therapy (Signed)
[place], [place], [address] Phone: [phone]   Fax:  [phone]  Physical Therapy Treatment  Patient Details  Name: [name] MRN: [id number] Date of Birth: 21-Sep-1945 Referring Provider:  Gregor Hams, MD  Encounter Date: 03/31/2015      PT End of Session - 03/31/15 1019    Visit Number 6   Number of Visits 10   Date for PT Re-Evaluation 04/15/15   PT Start Time 2      Past Medical History  Diagnosis Date  . Hyperlipidemia   . Recurrent sinusitis   . Breast cancer 1999    T1N0 LEFT BREAST  . 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 Osteoporosis 03-04-2011     LAST BONE DENSITY 03-04-2011  . Trigeminal neuralgia   . Degenerative disc disease   . History of bladder infections     RECURRENT  . Thyroid disease     Past Surgical History  Procedure Laterality Date  . Bladder suspension  2005  . Left breast lumpectomy  1999  . Cancerous ln removed  2008  . Breast surgery  1999    Lumpectomy L Br  . Breast surgery  2007    Lymph Node Removal L Chest Wall  . Abdominal hysterectomy  2005    Complete    There were no vitals filed for this visit.  Visit Diagnosis:  Pain of lumbar spine  Weakness of back  Abnormal posture      Subjective Assessment - 03/31/15 1017    Subjective Pt reports she is feeling good, might have a bruise on her back from hitting it on a wall or something.    Pertinent History weaning off muscle relaxers, only took one yesterday.    Currently in Pain? No/denies                         [facility] Adult PT Treatment/Exercise - 03/31/15 0001    Lumbar Exercises: Stretches   Active Hamstring Stretch 2 reps;30 seconds  with strap   Double Knee to Chest Stretch 2 reps;20 seconds   Lower Trunk Rotation --  10 reps each side   ITB Stretch 2 reps;30 seconds  corss body with strap   Lumbar  Exercises: Aerobic   Stationary Bike bike L2x5'   Lumbar Exercises: Seated   Long Arc Quad on [place] 20 reps  on blue disc   LAQ on [employer] (lbs) sitting on disc, opposite arm/leg lifst    Lumbar Exercises: Supine   Ab Set 10 reps  10 sec holds   Bridge 10 reps  with marching - very difficult for pt to keep pelvis stable   Bridge Limitations 10x10sec bridge holds   Straight Leg Raise 10 reps  x2 with PPT   Modalities   Modalities Cryotherapy;Electrical Stimulation   Cryotherapy   Number Minutes Cryotherapy 15 Minutes   Cryotherapy Location Lumbar Spine   Type of Cryotherapy Ice pack   Electrical Stimulation   Electrical Stimulation Location lumbar and SIJ   Electrical Stimulation Action IFC   Electrical Stimulation Parameters to tolerance   Electrical Stimulation Goals Pain                     PT Long Term Goals - 03/31/15 1027    PT LONG TERM GOAL #1   Title I with advanced HEP (  04/15/15)   PT LONG TERM GOAL #2   Title report pain decrease =/> 75% with sitting ( 04/15/15)   Status Achieved   PT LONG TERM GOAL #3   Title perform core stability ther ex without difficulty ( 04/15/15)   Status On-going   PT LONG TERM GOAL #4   Title improve FOTO =/< CJ level ( 04/15/15)   Status On-going   PT LONG TERM GOAL #5   Title maintain equal pelvic alignment =/> 1 wk ( 04/15/15)   Status Achieved               Plan - 03/31/15 1048    Clinical Impression Statement Pt is doing well this past week.  Progressing with decreased pain, increased core strength and improved flexibility.  She is going to purchase a home TENS unit and bring that in for instruction.    Pt will benefit from skilled therapeutic intervention in order to improve on the following deficits Decreased mobility;Decreased strength;Postural dysfunction;Pain;Increased muscle spasms   Rehab Potential Good   Clinical Impairments Affecting Rehab Potential osteoporosis   PT Duration --  5 wks   PT  Treatment/Interventions Passive range of motion;Patient/family education;Electrical Stimulation;Cryotherapy;Neuromuscular re-education;Manual techniques;Taping;Therapeutic exercise;Ultrasound;Moist Heat   PT Next Visit Plan progress lumbar stab ex.    Consulted and Agree with Plan of Care Patient        Problem List Patient Active Problem List   Diagnosis Date Noted  . Lumbosacral strain 03/09/2015  . Tachycardia 03/09/2015  . 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 Nipple soreness 11/16/2014  . Recurrent sinusitis   . Breast cancer   . History of bladder infections   . Thyroid disease   . Unspecified hypothyroidism 11/13/2013  . Trigeminal neuralgia pain 02/08/2011  . Breast carcinoma, left.  Original resection 1999. 01/28/2011  . ALLERGIC CONJUNCTIVITIS 11/27/2009  . GERD 10/07/2009  . VERTIGO 07/24/2009  . NECK PAIN 05/21/2009  . ALLERGIC RHINITIS 09/26/2008  . HYPERLIPIDEMIA, MIXED 12/20/2007  . NEURALGIA, TRIGEMINAL 12/20/2007  . UNSPECIFIED GLAUCOMA 12/20/2007  . DEGENERATIVE DISC DISEASE 12/20/2007  . Osteoporosis 12/20/2007  . Cancer 03/29/2006    Jeral Pinch, PT 03/31/2015, 11:04 AM  [facility] [place], [place], [address] Phone: [phone]   Fax:  [phone]

## 2015-04-02 ENCOUNTER — Encounter: Payer: Self-pay | Admitting: Physical Therapy

## 2015-04-02 ENCOUNTER — Ambulatory Visit (INDEPENDENT_AMBULATORY_CARE_PROVIDER_SITE_OTHER): Payer: Medicare Other | Admitting: Physical Therapy

## 2015-04-02 DIAGNOSIS — R293 Abnormal posture: Secondary | ICD-10-CM

## 2015-04-02 DIAGNOSIS — M545 Low back pain, unspecified: Secondary | ICD-10-CM

## 2015-04-02 DIAGNOSIS — M6281 Muscle weakness (generalized): Secondary | ICD-10-CM | POA: Diagnosis not present

## 2015-04-02 DIAGNOSIS — R29898 Other symptoms and signs involving the musculoskeletal system: Secondary | ICD-10-CM

## 2015-04-02 NOTE — Therapy (Signed)
[place], [place], [address] Phone: [phone]   Fax:  [phone]  Physical Therapy Treatment  Patient Details  Name: [name] MRN: [id number] Date of Birth: 01-12-1946 Referring Provider:  Gregor Hams, MD  Encounter Date: 04/02/2015      PT End of Session - 04/02/15 1447    Visit Number 7   Number of Visits 10   Date for PT Re-Evaluation 04/15/15   PT Start Time 1324   PT Stop Time 1542   PT Time Calculation (min) 55 min      Past Medical History  Diagnosis Date  . Hyperlipidemia   . Recurrent sinusitis   . Breast cancer 1999    T1N0 LEFT BREAST  . 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 Osteoporosis 03-04-2011     LAST BONE DENSITY 03-04-2011  . Trigeminal neuralgia   . Degenerative disc disease   . History of bladder infections     RECURRENT  . Thyroid disease     Past Surgical History  Procedure Laterality Date  . Bladder suspension  2005  . Left breast lumpectomy  1999  . Cancerous ln removed  2008  . Breast surgery  1999    Lumpectomy L Br  . Breast surgery  2007    Lymph Node Removal L Chest Wall  . Abdominal hysterectomy  2005    Complete    There were no vitals filed for this visit.  Visit Diagnosis:  Pain of lumbar spine  Weakness of back  Abnormal posture      Subjective Assessment - 04/02/15 1447    Subjective Pt reports she is feeling a little sore for two days, not bad though   Currently in Pain? Yes   Pain Score 2    Pain Location Hip   Pain Orientation Right   Pain Descriptors / Indicators Aching   Pain Type Acute pain   Aggravating Factors  exercise   Pain Relieving Factors ice, muscle relaxer                         [facility] Adult PT Treatment/Exercise - 04/02/15 0001    Lumbar Exercises: Stretches   Single Knee to Chest Stretch 30 seconds  each side   Lower Trunk Rotation --   20 reps   Piriformis Stretch Limitations 30 sec runners stretch each side   Lumbar Exercises: Aerobic   Stationary Bike Nustep L5 x 6'   Lumbar Exercises: Standing   Heel Raises 20 reps  off  edge of step   Other Standing Lumbar Exercises standing hip ext with green bandx20 each side with TA contraction   Lumbar Exercises: Supine   Ab Set 10 reps  holding table top for 5 sec.    Bridge 5 seconds;20 reps   Other Supine Lumbar Exercises TA contraction with leg circles individ, small circles on rt d/t hip popping.    Other Supine Lumbar Exercises 2x10 oblique work with reaching arm to outside of leg.    Lumbar Exercises: Sidelying   Clam 20 reps  bilat   Modalities   Modalities Cryotherapy;Electrical Stimulation   Cryotherapy   Number Minutes Cryotherapy 15 Minutes   Cryotherapy Location Lumbar Spine   Type of Cryotherapy Ice pack   Electrical Stimulation   Electrical Stimulation Location lumbar and SIJ   Psychologist, counselling  Stimulation Parameters to tolerance   Electrical Stimulation Goals Pain                     PT Long Term Goals - 03/31/15 1027    PT LONG TERM GOAL #1   Title I with advanced HEP ( 04/15/15)   PT LONG TERM GOAL #2   Title report pain decrease =/> 75% with sitting ( 04/15/15)   Status Achieved   PT LONG TERM GOAL #3   Title perform core stability ther ex without difficulty ( 04/15/15)   Status On-going   PT LONG TERM GOAL #4   Title improve FOTO =/< CJ level ( 04/15/15)   Status On-going   PT LONG TERM GOAL #5   Title maintain equal pelvic alignment =/> 1 wk ( 04/15/15)   Status Achieved               Plan - 04/02/15 1459    Clinical Impression Statement Pt tolerating ther ex without increased symptoms, thes actually decrease her pain. She is getting stronger.    Pt will benefit from skilled therapeutic intervention in order to improve on the following deficits Decreased mobility;Decreased  strength;Postural dysfunction;Pain;Increased muscle spasms   Rehab Potential Good   Clinical Impairments Affecting Rehab Potential osteoporosis   PT Frequency 2x / week   PT Treatment/Interventions Passive range of motion;Patient/family education;Electrical Stimulation;Cryotherapy;Neuromuscular re-education;Manual techniques;Taping;Therapeutic exercise;Ultrasound;Moist Heat   Consulted and Agree with Plan of Care Patient        Problem List Patient Active Problem List   Diagnosis Date Noted  . Lumbosacral strain 03/09/2015  . Tachycardia 03/09/2015  . 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 Nipple soreness 11/16/2014  . Recurrent sinusitis   . Breast cancer   . History of bladder infections   . Thyroid disease   . Unspecified hypothyroidism 11/13/2013  . Trigeminal neuralgia pain 02/08/2011  . Breast carcinoma, left.  Original resection 1999. 01/28/2011  . ALLERGIC CONJUNCTIVITIS 11/27/2009  . GERD 10/07/2009  . VERTIGO 07/24/2009  . NECK PAIN 05/21/2009  . ALLERGIC RHINITIS 09/26/2008  . HYPERLIPIDEMIA, MIXED 12/20/2007  . NEURALGIA, TRIGEMINAL 12/20/2007  . UNSPECIFIED GLAUCOMA 12/20/2007  . DEGENERATIVE DISC DISEASE 12/20/2007  . Osteoporosis 12/20/2007  . Cancer 03/29/2006    Jeral Pinch PT 04/02/2015, 3:29 PM  [facility] [place], [place], [address] Phone: [phone]   Fax:  [phone]

## 2015-04-07 ENCOUNTER — Ambulatory Visit (INDEPENDENT_AMBULATORY_CARE_PROVIDER_SITE_OTHER): Payer: Medicare Other | Admitting: Physical Therapy

## 2015-04-07 ENCOUNTER — Encounter: Payer: Self-pay | Admitting: Physical Therapy

## 2015-04-07 DIAGNOSIS — R29898 Other symptoms and signs involving the musculoskeletal system: Secondary | ICD-10-CM

## 2015-04-07 DIAGNOSIS — M545 Low back pain, unspecified: Secondary | ICD-10-CM

## 2015-04-07 DIAGNOSIS — M6281 Muscle weakness (generalized): Secondary | ICD-10-CM | POA: Diagnosis not present

## 2015-04-07 NOTE — Therapy (Signed)
[facility] [place], [place], [address] Phone: [phone]   Fax:  [phone]  Physical Therapy Treatment  Patient Details  Name: [name] MRN: [id number] Date of Birth: [birth year] Referring Provider:  Gregor Hams, MD  Encounter Date: 04/07/2015      PT End of Session - 04/07/15 0814    Visit Number 8   Number of Visits 10   Date for PT Re-Evaluation 04/15/15   PT Start Time 0804   PT Stop Time 3818   PT Time Calculation (min) 43 min      Past Medical History  Diagnosis Date  . Hyperlipidemia   . Recurrent sinusitis   . Breast cancer 1999    T1N0 LEFT BREAST  . 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 Osteoporosis 03-04-2011     LAST BONE DENSITY 03-04-2011  . Trigeminal neuralgia   . Degenerative disc disease   . History of bladder infections     RECURRENT  . Thyroid disease     Past Surgical History  Procedure Laterality Date  . Bladder suspension  2005  . Left breast lumpectomy  1999  . Cancerous ln removed  2008  . Breast surgery  1999    Lumpectomy L Br  . Breast surgery  2007    Lymph Node Removal L Chest Wall  . Abdominal hysterectomy  2005    Complete    There were no vitals filed for this visit.  Visit Diagnosis:  Pain of lumbar spine  Weakness of back      Subjective Assessment - 04/07/15 0811    Subjective Pt received her TENs unit, it has a back pad included.  She is doing well.    Currently in Pain? No/denies                         [facility] Adult PT Treatment/Exercise - 04/07/15 0001    Self-Care   Self-Care Other Self-Care Comments   Other Self-Care Comments  instruction in use of her home tens unit, care of electrodes, unit and brace, reviewed and practiced all the modes.    Lumbar Exercises: Aerobic   Stationary Bike Nustep L5 x 6'   Lumbar Exercises: Supine   Ab Set 5 reps;5 seconds   Bridge  20 reps;5 seconds  with feet on ball   Straight Leg Raise 10 reps  with abdominal bracing   Modalities   Modalities Electrical Stimulation   Electrical Stimulation   Electrical Stimulation Location lumbar and SIJ   Electrical Stimulation Action home TENs unit   Electrical Stimulation Parameters to tolerance   Electrical Stimulation Goals Pain                PT Education - 04/07/15 0813    Education provided Yes   Education Details home TENS unit   Person(s) Educated Patient   Methods Explanation;Demonstration;Handout   Comprehension Verbalized understanding             PT Long Term Goals - 04/07/15 0844    PT LONG TERM GOAL #1   Status On-going   PT LONG TERM GOAL #2   Title report pain decrease =/> 75% with sitting ( 04/15/15)   Status Achieved   PT LONG TERM GOAL #3   Title perform core stability ther ex without difficulty ( 04/15/15)   Status On-going   PT LONG TERM  GOAL #4   Title improve FOTO =/< CJ level ( 04/15/15)   Status On-going   PT LONG TERM GOAL #5   Title maintain equal pelvic alignment =/> 1 wk ( 04/15/15)   Status Achieved               Plan - 04/07/15 0847    Clinical Impression Statement Pt is doing well with managing her pain and having minimal to no pain.  She is very anxious in using her TENs unit and concerned that she will not be able to understand it. Pt was able to use unit in clinic with minimal assistance, it will be a confidence issue for the pt.    Pt will benefit from skilled therapeutic intervention in order to improve on the following deficits Decreased mobility;Decreased strength;Postural dysfunction;Pain;Increased muscle spasms   Rehab Potential Good   PT Frequency 2x / week   PT Next Visit Plan review home TENs usage and cont lumbar stab ex   Consulted and Agree with Plan of Care Patient        Problem List Patient Active Problem List   Diagnosis Date Noted  . Lumbosacral strain 03/09/2015  . Tachycardia  03/09/2015  . 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 Nipple soreness 11/16/2014  . Recurrent sinusitis   . Breast cancer   . History of bladder infections   . Thyroid disease   . Unspecified hypothyroidism 11/13/2013  . Trigeminal neuralgia pain 02/08/2011  . Breast carcinoma, left.  Original resection 1999. 01/28/2011  . ALLERGIC CONJUNCTIVITIS 11/27/2009  . GERD 10/07/2009  . VERTIGO 07/24/2009  . NECK PAIN 05/21/2009  . ALLERGIC RHINITIS 09/26/2008  . HYPERLIPIDEMIA, MIXED 12/20/2007  . NEURALGIA, TRIGEMINAL 12/20/2007  . UNSPECIFIED GLAUCOMA 12/20/2007  . DEGENERATIVE DISC DISEASE 12/20/2007  . Osteoporosis 12/20/2007  . Cancer 03/29/2006    Ryelle Ruvalcaba PT 04/07/2015, 9:50 AM  [facility] [place], [place], [address] Phone: [phone]   Fax:  [phone]

## 2015-04-07 NOTE — Patient Instructions (Signed)
Issued handout on home TENs unit

## 2015-04-09 ENCOUNTER — Ambulatory Visit (INDEPENDENT_AMBULATORY_CARE_PROVIDER_SITE_OTHER): Payer: Medicare Other | Admitting: Physical Therapy

## 2015-04-09 DIAGNOSIS — M5410 Radiculopathy, site unspecified: Secondary | ICD-10-CM | POA: Diagnosis not present

## 2015-04-09 DIAGNOSIS — M545 Low back pain, unspecified: Secondary | ICD-10-CM

## 2015-04-09 DIAGNOSIS — R293 Abnormal posture: Secondary | ICD-10-CM | POA: Diagnosis not present

## 2015-04-09 DIAGNOSIS — R29898 Other symptoms and signs involving the musculoskeletal system: Secondary | ICD-10-CM

## 2015-04-09 DIAGNOSIS — M6281 Muscle weakness (generalized): Secondary | ICD-10-CM | POA: Diagnosis not present

## 2015-04-09 DIAGNOSIS — M5418 Radiculopathy, sacral and sacrococcygeal region: Secondary | ICD-10-CM

## 2015-04-09 NOTE — Patient Instructions (Signed)
Abduction: Clam (Eccentric) - Side-Lying   Lie on side with knees bent. Lift top knee, keeping feet together. Keep trunk steady. Slowly lower for 3-5 seconds. _20__ reps per set, _1__ sets per day, _3-4__ days per week.  Healthy Back Strengthening - Back Extension on All Fours   Start on hands and knees, keeping them apart. Straighten right leg and left arm at the same time. Hold _1___ seconds. Switch immediately and repeat with left leg and right arm.  * Can break down into just arm/ just leg until comfortable to combine them.  Do _5___ times. Increase repetitions gradually up to __10-20__. Increase each hold gradually up to _5___ seconds.   Outer Hip Stretch: Reclined IT Band Stretch (Strap)   Strap around opposite foot, pull across only as far as possible with shoulders on mat. Hold for _20-30___ seconds. Repeat _2___ times each leg.  Hamstring Step 2   Left foot relaxed, knee straight, other leg bent, foot flat. Raise straight leg further upward to maximal range. Hold _30__ seconds. Relax leg completely down. Repeat _2__ times.  [facility] [address] [place], [place] [address]  [phone] (office) [phone] (fax)

## 2015-04-09 NOTE — Therapy (Signed)
[place], [place], [address] Phone: [phone]   Fax:  [phone]  Physical Therapy Treatment  Patient Details  Name: [name] MRN: [id number] Date of Birth: 1945/09/08 Referring Provider:  Gregor Hams, MD  Encounter Date: 04/09/2015      PT End of Session - 04/09/15 1007    Visit Number 9   Number of Visits 10   Date for PT Re-Evaluation 04/15/15   PT Start Time 1005   PT Stop Time 1110   PT Time Calculation (min) 65 min   Activity Tolerance No increased pain;Patient tolerated treatment well      Past Medical History  Diagnosis Date  . Hyperlipidemia   . Recurrent sinusitis   . Breast cancer 1999    T1N0 LEFT BREAST  . 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 Osteoporosis 03-04-2011     LAST BONE DENSITY 03-04-2011  . Trigeminal neuralgia   . Degenerative disc disease   . History of bladder infections     RECURRENT  . Thyroid disease     Past Surgical History  Procedure Laterality Date  . Bladder suspension  2005  . Left breast lumpectomy  1999  . Cancerous ln removed  2008  . Breast surgery  1999    Lumpectomy L Br  . Breast surgery  2007    Lymph Node Removal L Chest Wall  . Abdominal hysterectomy  2005    Complete    There were no vitals filed for this visit.  Visit Diagnosis:  Pain of lumbar spine  Weakness of back  Abnormal posture  Radicular pain of sacrum      Subjective Assessment - 04/09/15 1009    Subjective "Doing ok today; not very much pain".   Pt reports she is getting more comfortable with TENS unit use at home. Pt has reduced muscle relaxers to 1x/day.    Currently in Pain? Yes   Pain Score 2    Pain Location Back   Pain Orientation Right   Pain Descriptors / Indicators Dull   Aggravating Factors  exercise    Pain Relieving Factors TENS, heating pad, muscle relaxers             [facility] PT  Assessment - 04/09/15 0001    Assessment   Medical Diagnosis SI dysfunction   Onset Date/Surgical Date 02/23/15                     [facility] Adult PT Treatment/Exercise - 04/09/15 0001    Lumbar Exercises: Stretches   Passive Hamstring Stretch 2 reps;30 seconds  each leg; with strap   Single Knee to Chest Stretch 30 seconds  each side   Lower Trunk Rotation --  20 reps   ITB Stretch 2 reps;30 seconds  cross body with strap   Piriformis Stretch 1 rep;30 seconds  each leg   Lumbar Exercises: Aerobic   Stationary Bike Nustep L5 x 6'   Lumbar Exercises: Supine   Ab Set 5 reps;5 seconds   Bridge 20 reps;5 seconds  with feet on ball   Other Supine Lumbar Exercises 2x10 oblique work with reaching arm to outside of leg.   pt reported this activity irritated her neck too much   Lumbar Exercises: Sidelying   Clam 20 reps  bilat   Lumbar Exercises: Prone   Opposite Arm/Leg Raise Right arm/Left leg;Left arm/Right leg;5 reps  Other Prone Lumbar Exercises pelvic press x 10, 5 sec holds  VC to breathe through exercise   Lumbar Exercises: Quadruped   [place] 5 reps   Opposite Arm/Leg Raise Right arm/Left leg;Left arm/Right leg;5 reps   Modalities   Modalities Electrical Stimulation;Cryotherapy   Cryotherapy   Number Minutes Cryotherapy 15 Minutes   Cryotherapy Location Lumbar Spine   Type of Cryotherapy Ice pack   Electrical Stimulation   Electrical Stimulation Location lumbar and SIJ   Electrical Stimulation Action IFC   Electrical Stimulation Parameters to tolerance    Electrical Stimulation Goals Pain                PT Education - 04/09/15 1056    Education provided Yes   Education Details HEP - added additional exercises   Person(s) Educated Patient   Methods Handout;Explanation;Demonstration   Comprehension Verbalized understanding;Returned demonstration             PT Long Term Goals - 04/07/15 0844    PT LONG TERM GOAL #1   Status  On-going   PT LONG TERM GOAL #2   Title report pain decrease =/> 75% with sitting ( 04/15/15)   Status Achieved   PT LONG TERM GOAL #3   Title perform core stability ther ex without difficulty ( 04/15/15)   Status On-going   PT LONG TERM GOAL #4   Title improve FOTO =/< CJ level ( 04/15/15)   Status On-going   PT LONG TERM GOAL #5   Title maintain equal pelvic alignment =/> 1 wk ( 04/15/15)   Status Achieved               Plan - 04/09/15 1101    Clinical Impression Statement Pt tolerated all new exercises with minimal to no increase in pain.  Pt beginning to feel more comfortable with home TENS unit outside of therapy.  Pt voiced that she feels ready to attempt stair training; can assess tolerance next session. Pt making progress towards remaining goals.    Pt will benefit from skilled therapeutic intervention in order to improve on the following deficits Decreased mobility;Decreased strength;Postural dysfunction;Pain;Increased muscle spasms   Rehab Potential Good   Clinical Impairments Affecting Rehab Potential osteoporosis   PT Frequency 2x / week   PT Duration --  5 wks   PT Treatment/Interventions Passive range of motion;Patient/family education;Electrical Stimulation;Cryotherapy;Neuromuscular re-education;Manual techniques;Taping;Therapeutic exercise;Ultrasound;Moist Heat   PT Next Visit Plan review home TENs usage and cont lumbar stab ex. Assess readiness for d/c to HEP.    Consulted and Agree with Plan of Care Patient        Problem List Patient Active Problem List   Diagnosis Date Noted  . Lumbosacral strain 03/09/2015  . Tachycardia 03/09/2015  . 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 Nipple soreness 11/16/2014  . Recurrent sinusitis   . Breast cancer   . History of bladder infections   . Thyroid disease   . Unspecified hypothyroidism 11/13/2013  . Trigeminal neuralgia pain 02/08/2011   . Breast carcinoma, left.  Original resection 1999. 01/28/2011  . ALLERGIC CONJUNCTIVITIS 11/27/2009  . GERD 10/07/2009  . VERTIGO 07/24/2009  . NECK PAIN 05/21/2009  . ALLERGIC RHINITIS 09/26/2008  . HYPERLIPIDEMIA, MIXED 12/20/2007  . NEURALGIA, TRIGEMINAL 12/20/2007  . UNSPECIFIED GLAUCOMA 12/20/2007  . DEGENERATIVE DISC DISEASE 12/20/2007  . Osteoporosis 12/20/2007  . Cancer 03/29/2006    Kerin Perna, PTA 04/09/2015 11:06 AM  [facility] [place], [place], [address] Phone: [phone]   Fax:  [phone]

## 2015-04-14 ENCOUNTER — Other Ambulatory Visit: Payer: Self-pay | Admitting: Family Medicine

## 2015-04-15 ENCOUNTER — Ambulatory Visit (INDEPENDENT_AMBULATORY_CARE_PROVIDER_SITE_OTHER): Payer: Medicare Other | Admitting: Physical Therapy

## 2015-04-15 ENCOUNTER — Encounter: Payer: Self-pay | Admitting: Physical Therapy

## 2015-04-15 DIAGNOSIS — M545 Low back pain, unspecified: Secondary | ICD-10-CM

## 2015-04-15 DIAGNOSIS — R293 Abnormal posture: Secondary | ICD-10-CM | POA: Diagnosis not present

## 2015-04-15 DIAGNOSIS — M6281 Muscle weakness (generalized): Secondary | ICD-10-CM | POA: Diagnosis not present

## 2015-04-15 DIAGNOSIS — R29898 Other symptoms and signs involving the musculoskeletal system: Secondary | ICD-10-CM

## 2015-04-15 NOTE — Therapy (Signed)
[place], [place], [address] Phone: [phone]   Fax:  [phone]  Physical Therapy Treatment  Patient Details  Name: [name] MRN: [id number] Date of Birth: September 26, 1945 Referring Provider:  Gregor Hams, MD  Encounter Date: 04/15/2015      PT End of Session - 04/15/15 1030    Visit Number 10   Number of Visits 10   Date for PT Re-Evaluation 04/15/15   PT Start Time 9767   PT Stop Time 1109   PT Time Calculation (min) 51 min      Past Medical History  Diagnosis Date  . Hyperlipidemia   . Recurrent sinusitis   . Breast cancer 1999    T1N0 LEFT BREAST  . 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 Osteoporosis 03-04-2011     LAST BONE DENSITY 03-04-2011  . Trigeminal neuralgia   . Degenerative disc disease   . History of bladder infections     RECURRENT  . Thyroid disease     Past Surgical History  Procedure Laterality Date  . Bladder suspension  2005  . Left breast lumpectomy  1999  . Cancerous ln removed  2008  . Breast surgery  1999    Lumpectomy L Br  . Breast surgery  2007    Lymph Node Removal L Chest Wall  . Abdominal hysterectomy  2005    Complete    There were no vitals filed for this visit.  Visit Diagnosis:  Pain of lumbar spine  Weakness of back  Abnormal posture      Subjective Assessment - 04/15/15 1022    Subjective Pt reports she had a really good yesterday She is doing well her TENS unit and is ready for D/C    Currently in Pain? Yes   Pain Score 1    Pain Location Back   Pain Orientation Right   Pain Descriptors / Indicators Dull   Pain Type Acute pain   Pain Onset More than a month ago   Pain Frequency Intermittent            [facility] PT Assessment - 04/15/15 0001    Assessment   Medical Diagnosis SI dysfunction   Onset Date/Surgical Date 02/23/15   Observation/Other Assessments   Focus on  Therapeutic Outcomes (FOTO)  45% limited   Posture/Postural Control   Posture Comments good leg length alignment   Strength   Right Hip Flexion 5/5   Right Hip Extension 5/5   Right Hip ABduction 5/5   Left Hip Flexion 5/5   Left Hip Extension 5/5   Left Hip ABduction 5/5                     [facility] Adult PT Treatment/Exercise - 04/15/15 0001    Exercises   Exercises --  use of med pressure ball for TPR to Rt glut   Lumbar Exercises: Stretches   Active Hamstring Stretch 30 seconds  bilat with strap   Double Knee to Chest Stretch 30 seconds   Piriformis Stretch 2 reps;30 seconds  bilat   Lumbar Exercises: Supine   Ab Set 10 reps;5 seconds  table top to knee extension   Bridge 20 reps   Straight Leg Raise 10 reps   Isometric Hip Flexion 10 reps;5 seconds  contralateral   Other Supine Lumbar Exercises abd bracing with small leg circles x 10   Lumbar  Exercises: Prone   Opposite Arm/Leg Raise 20 reps;Right arm/Left leg;Left arm/Right leg   Modalities   Modalities Electrical Stimulation;Cryotherapy   Cryotherapy   Number Minutes Cryotherapy 15 Minutes   Cryotherapy Location Lumbar Spine   Type of Cryotherapy Ice pack   Electrical Stimulation   Electrical Stimulation Location lumbar and SIJ   Electrical Stimulation Action IFC   Electrical Stimulation Parameters to tolerance   Electrical Stimulation Goals Pain                     PT Long Term Goals - 04/15/15 1030    PT LONG TERM GOAL #1   Title I with advanced HEP ( 04/15/15)   Status Achieved   PT LONG TERM GOAL #2   Title report pain decrease =/> 75% with sitting ( 04/15/15)   Status Achieved   PT LONG TERM GOAL #3   Title perform core stability ther ex without difficulty ( 04/15/15)   Status Achieved   PT LONG TERM GOAL #4   Title improve FOTO =/< CJ level ( 04/15/15)   Status Not Met  currently 45% limited, CK level, improved from 60% limited   PT LONG TERM GOAL #5   Title maintain equal  pelvic alignment =/> 1 wk ( 04/15/15)   Status Achieved               Problem List Patient Active Problem List   Diagnosis Date Noted  . Lumbosacral strain 03/09/2015  . Tachycardia 03/09/2015  . 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 Nipple soreness 11/16/2014  . Recurrent sinusitis   . Breast cancer   . History of bladder infections   . Thyroid disease   . Unspecified hypothyroidism 11/13/2013  . Trigeminal neuralgia pain 02/08/2011  . Breast carcinoma, left.  Original resection 1999. 01/28/2011  . ALLERGIC CONJUNCTIVITIS 11/27/2009  . GERD 10/07/2009  . VERTIGO 07/24/2009  . NECK PAIN 05/21/2009  . ALLERGIC RHINITIS 09/26/2008  . HYPERLIPIDEMIA, MIXED 12/20/2007  . NEURALGIA, TRIGEMINAL 12/20/2007  . UNSPECIFIED GLAUCOMA 12/20/2007  . DEGENERATIVE DISC DISEASE 12/20/2007  . Osteoporosis 12/20/2007  . Cancer 03/29/2006    Jeral Pinch PT 04/15/2015, 10:58 AM  [facility] [place], [place], [address] Phone: [phone]   Fax:  [phone]  PHYSICAL THERAPY DISCHARGE SUMMARY  Visits from Start of Care: 10  Current functional level related to goals / functional outcomes: See above   Remaining deficits: occasional Rt buttock pain   Education / Equipment: HEP Plan: Patient agrees to discharge.  Patient goals were partially met. Patient is being discharged due to being pleased with the current functional level.  ?????       Jeral Pinch, PT 04/15/2015 11:01 AM

## 2015-04-16 ENCOUNTER — Other Ambulatory Visit: Payer: Self-pay | Admitting: *Deleted

## 2015-04-16 MED ORDER — LEVOTHYROXINE SODIUM 50 MCG PO TABS: ORAL_TABLET | ORAL | Status: DC

## 2015-04-21 ENCOUNTER — Telehealth: Payer: Self-pay | Admitting: Family Medicine

## 2015-04-21 NOTE — Telephone Encounter (Signed)
Contacted Pt regarding compliance with Pravastatin 40mg  tablets per Health Guide we received via fax. Pt reports she takes this Rx daily.

## 2015-04-24 ENCOUNTER — Other Ambulatory Visit: Payer: Self-pay | Admitting: Family Medicine

## 2015-05-05 ENCOUNTER — Other Ambulatory Visit: Payer: Self-pay | Admitting: *Deleted

## 2015-05-05 MED ORDER — HYDROCODONE-ACETAMINOPHEN 5-325 MG PO TABS: ORAL_TABLET | ORAL | Status: DC

## 2015-05-05 NOTE — Telephone Encounter (Signed)
Pt would like refill for hydrocodone.[name] +

## 2015-05-12 ENCOUNTER — Ambulatory Visit (INDEPENDENT_AMBULATORY_CARE_PROVIDER_SITE_OTHER): Payer: Medicare Other | Admitting: Family Medicine

## 2015-05-12 ENCOUNTER — Encounter: Payer: Self-pay | Admitting: Family Medicine

## 2015-05-12 VITALS — BP 134/85 | HR 94 | Wt 130.0 lb

## 2015-05-12 DIAGNOSIS — M5136 Other intervertebral disc degeneration, lumbar region: Secondary | ICD-10-CM

## 2015-05-12 DIAGNOSIS — S338XXA Sprain of other parts of lumbar spine and pelvis, initial encounter: Secondary | ICD-10-CM

## 2015-05-12 DIAGNOSIS — S39012A Strain of muscle, fascia and tendon of lower back, initial encounter: Secondary | ICD-10-CM

## 2015-05-12 MED ORDER — METHOCARBAMOL 500 MG PO TABS: 500.0000 mg | ORAL_TABLET | Freq: Three times a day (TID) | ORAL | Status: DC | PRN

## 2015-05-12 NOTE — Assessment & Plan Note (Signed)
With intermittent flareups. Doing pretty well. Refill Robaxin. Return as needed.

## 2015-05-12 NOTE — Progress Notes (Signed)
[name] is a 69 y.o. female who presents to [facility]  today for follow-up back pain. Patient was seen last in August 1 for back pain. Since then she's had multiple physical therapy visits. She states that she is 80 or 90% better feeling pretty good. She notes that she is about to go to [place] and do a "show" which required her to [facility] boots for 12 hours a time for 10 days in a row. She is worried her back pain is in a worsened. She would like a refill of her Robaxin then. She uses home exercises and a TENS unit for back pain which helps well. No radiating pain weakness or numbness fevers or chills.   Past Medical History  Diagnosis Date  . Hyperlipidemia   . Recurrent sinusitis   . Breast cancer 1999    T1N0 LEFT BREAST  . 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 Osteoporosis 03-04-2011     LAST BONE DENSITY 03-04-2011  . Trigeminal neuralgia   . Degenerative disc disease   . History of bladder infections     RECURRENT  . Thyroid disease    Past Surgical History  Procedure Laterality Date  . Bladder suspension  2005  . Left breast lumpectomy  1999  . Cancerous ln removed  2008  . Breast surgery  1999    Lumpectomy L Br  . Breast surgery  2007    Lymph Node Removal L Chest Wall  . Abdominal hysterectomy  2005    Complete   Social History  Substance Use Topics  . Smoking status: Never Smoker   . Smokeless tobacco: Never Used  . Alcohol Use: No   family history includes Cancer in her father, maternal aunt, mother, paternal grandmother, and son; Hyperlipidemia in her mother; Stomach cancer in her paternal uncle and paternal uncle; Stroke in her mother.  ROS as above Medications: Current Outpatient Prescriptions  Medication Sig Dispense Refill  . azelastine (ASTELIN) 137 MCG/SPRAY nasal spray Place 2 sprays into the nose Twice daily.    . Calcium-Magnesium-Vitamin D (CALCIUM 1200+D3 PO) Take  by mouth daily.     . carbamazepine (TEGRETOL XR) 200 MG 12 hr tablet TAKE ONE TABLET TWICE DAILY 180 tablet 3  . conjugated estrogens (PREMARIN) vaginal cream Place 0.625 g vaginally daily. Apply a pea size amount into vagina every 2-3 days    . denosumab (PROLIA) 60 MG/ML SOLN injection Inject 60 mg into the skin every 6 (six) months. Administer in upper arm, thigh, or abdomen    . fish oil-omega-3 fatty acids 1000 MG capsule Take 2 g by mouth daily.      Marland Kitchen HYDROcodone-acetaminophen (NORCO/VICODIN) 5-325 MG per tablet TAKE ONE TABLET BY MOUTH EVERY 12 HOURS AS NEEDED FOR PAIN 60 tablet 0  . latanoprost (XALATAN) 0.005 % ophthalmic solution Place 1 drop into both eyes At bedtime.    Marland Kitchen letrozole (FEMARA) 2.5 MG tablet TAKE 1 BY MOUTH DAILY 90 tablet 3  . levocetirizine (XYZAL) 5 MG tablet Take 5 mg by mouth daily.      Marland Kitchen levothyroxine (SYNTHROID, LEVOTHROID) 50 MCG tablet TAKE ONE TABLET BY MOUTH ONCE DAILY 90 tablet 1  . methocarbamol (ROBAXIN) 500 MG tablet Take 1 tablet (500 mg total) by mouth every 8 (eight) hours as needed for muscle spasms. 90 tablet 3  . montelukast (SINGULAIR) 10 MG tablet Take 1 tablet (10 mg total) by  mouth at bedtime. 180 tablet 1  . omeprazole (PRILOSEC) 20 MG capsule Take 1 capsule (20 mg total) by mouth 2 (two) times daily before a meal. (Patient taking differently: Take 20 mg by mouth daily as needed. ) 120 capsule 3  . pravastatin (PRAVACHOL) 40 MG tablet Take 1 tablet (40 mg total) by mouth daily. Patient needs to schedule a follow up appointment before more refills. 90 tablet 0  . senna (SENOKOT) 8.6 MG tablet Take 1 tablet by mouth at bedtime.    . traMADol (ULTRAM) 50 MG tablet Take 1 tablet (50 mg total) by mouth 3 (three) times daily as needed for severe pain. for pain 360 tablet 0  . triamterene-hydrochlorothiazide (MAXZIDE-25) 37.5-25 MG per tablet TAKE ONE TABLET BY MOUTH ONCE DAILY. 90 tablet 1  . triamterene-hydrochlorothiazide (MAXZIDE-25) 37.5-25 MG per  tablet TAKE ONE TABLET BY MOUTH ONCE DAILY. 90 tablet 1   No current facility-administered medications for this visit.   Allergies  Allergen Reactions  . Other Rash  . Ampicillin   . Cefadroxil     REACTION: rash  . Doxycycline Itching  . Simvastatin Other (See Comments)    Aches and memory loss  . Dicloxacillin Rash     Exam:  BP 134/85 mmHg  Pulse 94  Wt 130 lb (58.968 kg) Gen: Well NAD HEENT: EOMI,  MMM Lungs: Normal work of breathing. CTABL Heart: RRR no MRG Abd: NABS, Soft. Nondistended, Nontender Exts: Brisk capillary refill, warm and well perfused.  Back: Mildly tender palpation right SI joint. Normal motion normal gait.  No results found for this or any previous visit (from the past 24 hour(s)). No results found.   Please see individual assessment and plan sections.

## 2015-05-12 NOTE — Patient Instructions (Signed)
Thank you for coming in today. Return as needed.  Continue the PT exercises.  Come back or go to the emergency room if you notice new weakness new numbness problems walking or bowel or bladder problems.

## 2015-05-18 ENCOUNTER — Other Ambulatory Visit: Payer: Medicare Other

## 2015-05-18 ENCOUNTER — Ambulatory Visit: Payer: Medicare Other | Admitting: Oncology

## 2015-05-18 ENCOUNTER — Telehealth: Payer: Self-pay | Admitting: *Deleted

## 2015-05-18 MED ORDER — TRAZODONE HCL 50 MG PO TABS: 25.0000 mg | ORAL_TABLET | Freq: Every evening | ORAL | Status: DC | PRN

## 2015-05-18 MED ORDER — PANTOPRAZOLE SODIUM 20 MG PO TBEC: 20.0000 mg | DELAYED_RELEASE_TABLET | Freq: Every day | ORAL | Status: DC | PRN

## 2015-05-18 NOTE — Telephone Encounter (Signed)
I will call in trazodone for sleep.  I will change omeprazole to pantoprazole.

## 2015-05-18 NOTE — Telephone Encounter (Signed)
Pt called and requested a new medication for sleep. She stated that she cannot take Ambien. Also she would like an alternative to Omeprazole. Will fwd to pcp for advice.Audelia Hives Roff

## 2015-05-19 NOTE — Telephone Encounter (Signed)
lvm informing pt of meds.[name] [place]

## 2015-05-20 ENCOUNTER — Other Ambulatory Visit: Payer: Self-pay | Admitting: Oncology

## 2015-05-20 DIAGNOSIS — C771 Secondary and unspecified malignant neoplasm of intrathoracic lymph nodes: Secondary | ICD-10-CM

## 2015-05-20 DIAGNOSIS — C50912 Malignant neoplasm of unspecified site of left female breast: Secondary | ICD-10-CM

## 2015-05-25 ENCOUNTER — Encounter: Payer: Self-pay | Admitting: Oncology

## 2015-05-25 ENCOUNTER — Other Ambulatory Visit (HOSPITAL_BASED_OUTPATIENT_CLINIC_OR_DEPARTMENT_OTHER): Payer: Medicare Other

## 2015-05-25 ENCOUNTER — Other Ambulatory Visit: Payer: Self-pay | Admitting: *Deleted

## 2015-05-25 ENCOUNTER — Ambulatory Visit (HOSPITAL_BASED_OUTPATIENT_CLINIC_OR_DEPARTMENT_OTHER): Payer: Medicare Other | Admitting: Oncology

## 2015-05-25 VITALS — BP 125/65 | HR 97 | Temp 98.4°F | Resp 20 | Ht 67.0 in | Wt 131.2 lb

## 2015-05-25 DIAGNOSIS — C50912 Malignant neoplasm of unspecified site of left female breast: Secondary | ICD-10-CM

## 2015-05-25 DIAGNOSIS — Z23 Encounter for immunization: Secondary | ICD-10-CM | POA: Diagnosis not present

## 2015-05-25 DIAGNOSIS — C771 Secondary and unspecified malignant neoplasm of intrathoracic lymph nodes: Secondary | ICD-10-CM | POA: Diagnosis not present

## 2015-05-25 DIAGNOSIS — Z9109 Other allergy status, other than to drugs and biological substances: Secondary | ICD-10-CM

## 2015-05-25 DIAGNOSIS — Z79899 Other long term (current) drug therapy: Secondary | ICD-10-CM

## 2015-05-25 DIAGNOSIS — Z91048 Other nonmedicinal substance allergy status: Secondary | ICD-10-CM

## 2015-05-25 DIAGNOSIS — M818 Other osteoporosis without current pathological fracture: Secondary | ICD-10-CM

## 2015-05-25 DIAGNOSIS — T386X5A Adverse effect of antigonadotrophins, antiestrogens, antiandrogens, not elsewhere classified, initial encounter: Secondary | ICD-10-CM

## 2015-05-25 LAB — CBC WITH DIFFERENTIAL/PLATELET
BASO%: 0.7 % (ref 0.0–2.0)
BASOS ABS: 0 10*3/uL (ref 0.0–0.1)
EOS%: 1.8 % (ref 0.0–7.0)
Eosinophils Absolute: 0.1 10*3/uL (ref 0.0–0.5)
HEMATOCRIT: 35.8 % (ref 34.8–46.6)
HGB: 11.9 g/dL (ref 11.6–15.9)
LYMPH#: 1.3 10*3/uL (ref 0.9–3.3)
LYMPH%: 31.4 % (ref 14.0–49.7)
MCH: 31.2 pg (ref 25.1–34.0)
MCHC: 33.2 g/dL (ref 31.5–36.0)
MCV: 93.9 fL (ref 79.5–101.0)
MONO#: 0.3 10*3/uL (ref 0.1–0.9)
MONO%: 7.6 % (ref 0.0–14.0)
NEUT#: 2.4 10*3/uL (ref 1.5–6.5)
NEUT%: 58.5 % (ref 38.4–76.8)
PLATELETS: 191 10*3/uL (ref 145–400)
RBC: 3.81 10*6/uL (ref 3.70–5.45)
RDW: 12.8 % (ref 11.2–14.5)
WBC: 4.1 10*3/uL (ref 3.9–10.3)

## 2015-05-25 LAB — COMPREHENSIVE METABOLIC PANEL (CC13)
ALT: 14 U/L (ref 0–55)
ANION GAP: 7 meq/L (ref 3–11)
AST: 21 U/L (ref 5–34)
Albumin: 4.1 g/dL (ref 3.5–5.0)
Alkaline Phosphatase: 39 U/L — ABNORMAL LOW (ref 40–150)
BUN: 18.2 mg/dL (ref 7.0–26.0)
CALCIUM: 9.9 mg/dL (ref 8.4–10.4)
CHLORIDE: 102 meq/L (ref 98–109)
CO2: 34 meq/L — AB (ref 22–29)
Creatinine: 0.9 mg/dL (ref 0.6–1.1)
EGFR: 67 mL/min/{1.73_m2} — ABNORMAL LOW (ref >90)
Glucose: 83 mg/dl (ref 70–140)
POTASSIUM: 3.9 meq/L (ref 3.5–5.1)
Sodium: 143 mEq/L (ref 136–145)
Total Bilirubin: <0.3 mg/dL (ref 0.20–1.20)
Total Protein: 6.2 g/dL — ABNORMAL LOW (ref 6.4–8.3)

## 2015-05-25 MED ORDER — TAMOXIFEN CITRATE 20 MG PO TABS: 20.0000 mg | ORAL_TABLET | Freq: Every day | ORAL | Status: DC

## 2015-05-25 MED ORDER — INFLUENZA VAC SPLIT QUAD 0.5 ML IM SUSY
0.5000 mL | PREFILLED_SYRINGE | Freq: Once | INTRAMUSCULAR | Status: AC
  Administered 2015-05-25: 0.5 mL via INTRAMUSCULAR
  Filled 2015-05-25: qty 0.5
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
## 2015-05-26 ENCOUNTER — Telehealth: Payer: Self-pay | Admitting: Oncology

## 2015-05-26 DIAGNOSIS — Z79899 Other long term (current) drug therapy: Secondary | ICD-10-CM | POA: Insufficient documentation

## 2015-05-26 DIAGNOSIS — Z9109 Other allergy status, other than to drugs and biological substances: Secondary | ICD-10-CM | POA: Insufficient documentation

## 2015-05-26 DIAGNOSIS — Z23 Encounter for immunization: Secondary | ICD-10-CM | POA: Insufficient documentation

## 2015-05-26 NOTE — Telephone Encounter (Signed)
Spoke with patient and she is aware of her follow up appointment °

## 2015-06-08 ENCOUNTER — Other Ambulatory Visit: Payer: Self-pay | Admitting: *Deleted

## 2015-06-08 MED ORDER — HYDROCODONE-ACETAMINOPHEN 5-325 MG PO TABS: ORAL_TABLET | ORAL | Status: DC

## 2015-06-16 ENCOUNTER — Telehealth: Payer: Self-pay | Admitting: Family Medicine

## 2015-06-16 MED ORDER — PREDNISONE 20 MG PO TABS: 40.0000 mg | ORAL_TABLET | Freq: Every day | ORAL | Status: DC

## 2015-06-16 NOTE — Telephone Encounter (Signed)
[name] is in [place] working and is feeling Dizzy from her Meniere syndrome that she says you are aware of.... She is requesting you call her in Prednisone to [facility] at [facility], [address]  And please call her back on her husband's- Ryin Schillo Cell phone that I entered with this phone note. Thanks, [employer] number is [phone]

## 2015-06-16 NOTE — Telephone Encounter (Signed)
OK, script sent to [facility] in [place].

## 2015-06-17 NOTE — Telephone Encounter (Signed)
Left message informing rx has been sent

## 2015-06-24 ENCOUNTER — Encounter: Payer: Self-pay | Admitting: Physician Assistant

## 2015-06-24 ENCOUNTER — Ambulatory Visit (INDEPENDENT_AMBULATORY_CARE_PROVIDER_SITE_OTHER): Payer: Medicare Other | Admitting: Physician Assistant

## 2015-06-24 VITALS — BP 137/73 | HR 102 | Ht 67.0 in | Wt 127.0 lb

## 2015-06-24 DIAGNOSIS — K2970 Gastritis, unspecified, without bleeding: Secondary | ICD-10-CM

## 2015-06-24 DIAGNOSIS — K2990 Gastroduodenitis, unspecified, without bleeding: Secondary | ICD-10-CM

## 2015-06-24 DIAGNOSIS — K219 Gastro-esophageal reflux disease without esophagitis: Secondary | ICD-10-CM | POA: Diagnosis not present

## 2015-06-24 MED ORDER — SUCRALFATE 1 G PO TABS: 1.0000 g | ORAL_TABLET | Freq: Four times a day (QID) | ORAL | Status: DC

## 2015-06-24 MED ORDER — OMEPRAZOLE 40 MG PO CPDR: 40.0000 mg | DELAYED_RELEASE_CAPSULE | Freq: Every day | ORAL | Status: DC

## 2015-06-24 MED ORDER — DEXLANSOPRAZOLE 60 MG PO CPDR: 60.0000 mg | DELAYED_RELEASE_CAPSULE | Freq: Every day | ORAL | Status: DC

## 2015-06-24 NOTE — Progress Notes (Addendum)
[name] is a 69 y.o. female who presents to [facility] today for upset stomach. 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 She states that she began to have increased indigestion, belching, constipation and headache. She became dizzy and vomited once on Tuesday but attributes that her Meniere's disease. Yesterday she began to have diarrhea as well. She states that OTC Tums have helped while the Protonix has not. She states that baked apples seem to aggravate her symptoms. She denies nausea, hematemesis, hematochezia, watery or bloody diarrhea, bloating, flatus, chest pain, shortness of breath, myalgias or arthralgias.    Past Medical History  Diagnosis Date  . Hyperlipidemia   . Recurrent sinusitis   . Breast cancer ([place]) 1999    T1N0 LEFT BREAST  . Cancer ([place]) 03-2006    METASTATIC BREAST TO APPARENT ISOLATED LEFT INTERNAL MAMMARY NODE  . Osteoporosis 03-04-2011     LAST BONE DENSITY 03-04-2011  . Trigeminal neuralgia   . Degenerative disc disease   . History of bladder infections     RECURRENT  . Thyroid disease    Past Surgical History  Procedure Laterality Date  . Bladder suspension  2005  . Left breast lumpectomy  1999  . Cancerous ln removed  2008  . Breast surgery  1999    Lumpectomy L Br  . Breast surgery  2007    Lymph Node Removal L Chest Wall  . Abdominal hysterectomy  2005    Complete   Social History  Substance Use Topics  . Smoking status: Never Smoker   . Smokeless tobacco: Never Used  . Alcohol Use: No   family history includes Cancer in her father, maternal aunt, mother, paternal grandmother, and son; Hyperlipidemia in her mother; Stomach cancer in her paternal uncle and paternal uncle; Stroke in her mother.  ROS as above Medications: Current Outpatient Prescriptions  Medication Sig Dispense Refill  . azelastine (ASTELIN) 137  MCG/SPRAY nasal spray Place 2 sprays into the nose Twice daily.    . Calcium-Magnesium-Vitamin D (CALCIUM 1200+D3 PO) Take by mouth daily.     . carbamazepine (TEGRETOL XR) 200 MG 12 hr tablet TAKE ONE TABLET TWICE DAILY 180 tablet 3  . conjugated estrogens (PREMARIN) vaginal cream Place 0.625 g vaginally daily. Apply a pea size amount into vagina every 2-3 days    . denosumab (PROLIA) 60 MG/ML SOLN injection Inject 60 mg into the skin every 6 (six) months. Administer in upper arm, thigh, or abdomen    . fish oil-omega-3 fatty acids 1000 MG capsule Take 2 g by mouth daily.      Marland Kitchen HYDROcodone-acetaminophen (NORCO/VICODIN) 5-325 MG tablet TAKE ONE TABLET BY MOUTH EVERY 12 HOURS AS NEEDED FOR PAIN 60 tablet 0  . latanoprost (XALATAN) 0.005 % ophthalmic solution Place 1 drop into both eyes At bedtime.    Marland Kitchen levocetirizine (XYZAL) 5 MG tablet Take 5 mg by mouth daily.      Marland Kitchen levothyroxine (SYNTHROID, LEVOTHROID) 50 MCG tablet TAKE ONE TABLET BY MOUTH ONCE DAILY 90 tablet 1  . methocarbamol (ROBAXIN) 500 MG tablet Take 1 tablet (500 mg total) by mouth every 8 (eight) hours as needed for muscle spasms. 90 tablet 3  . montelukast (SINGULAIR) 10 MG tablet Take 1 tablet (10 mg total) by mouth at bedtime. 180 tablet 1  . pantoprazole (PROTONIX) 20 MG tablet  Take 1 tablet (20 mg total) by mouth daily as needed. 90 tablet 1  . pravastatin (PRAVACHOL) 40 MG tablet Take 1 tablet (40 mg total) by mouth daily. Patient needs to schedule a follow up appointment before more refills. 90 tablet 0  . senna (SENOKOT) 8.6 MG tablet Take 1 tablet by mouth at bedtime.    . traMADol (ULTRAM) 50 MG tablet Take 1 tablet (50 mg total) by mouth 3 (three) times daily as needed for severe pain. for pain 360 tablet 0  . traZODone (DESYREL) 50 MG tablet Take 0.5-2 tablets (25-100 mg total) by mouth at bedtime as needed for sleep. 60 tablet 3  . triamterene-hydrochlorothiazide (MAXZIDE-25) 37.5-25 MG per tablet TAKE ONE TABLET BY MOUTH  ONCE DAILY. 90 tablet 1  . dexlansoprazole (DEXILANT) 60 MG capsule Take 1 capsule (60 mg total) by mouth daily. 30 capsule 5  . sucralfate (CARAFATE) 1 G tablet Take 1 tablet (1 g total) by mouth 4 (four) times daily. For 2 weeks. 60 tablet 0  . tamoxifen (NOLVADEX) 20 MG tablet Take 1 tablet (20 mg total) by mouth daily. (Patient not taking: Reported on 06/24/2015) 30 tablet 1   No current facility-administered medications for this visit.   Allergies  Allergen Reactions  . Other Rash  . Ampicillin   . Cefadroxil     REACTION: rash  . Doxycycline Itching  . Simvastatin Other (See Comments)    Aches and memory loss  . Dicloxacillin Rash     Exam:  BP 137/73 mmHg  Pulse 102  Ht 5\' 7"  (1.702 m)  Wt 127 lb (57.607 kg)  BMI 19.89 kg/m2 Gen: WDWN female in no acute distress.  HEENT: EOMI,  MMM Lungs: Normal work of breathing. CTABL Heart: RRR no MRG Abd: NABS, Soft. Nondistended, Nontender. Exts: Brisk capillary refill, warm and well perfused.   Patient received GI cocktail in office today and states that her symptoms have improved.   Assessment: Gastritis based on patient description of symptoms as well as events leading up to onset of symptoms. Episode of vomiting likely related to aggravation of her Meniere's disease and unrelated to gastritis.   Plan: Patient prescribed sucralfate 1 G tablet QID x 4 weeks for symptomatic relief of gastritis and ulcer formation; patient prescribed Dexlansoprazole 60 mg capsule QD to replace Protonix for relief of GERD symptoms. Patient instructed to remain on Protonix until Dexlansoprazole prescription is filled as cost may inhibit patient compliance. Patient instructed to return to the office or ED if she experiences hematemesis, hematochezia, severe abdominal, shortness of breath or chest pain. Patient conveyed understanding and agreed to current treatment plan.   Pt called and insurance would not pay for dexilant we sent omeprazole 40mg   daily to try.

## 2015-07-10 ENCOUNTER — Other Ambulatory Visit: Payer: Self-pay | Admitting: Physician Assistant

## 2015-07-10 ENCOUNTER — Telehealth: Payer: Self-pay | Admitting: Family Medicine

## 2015-07-10 NOTE — Telephone Encounter (Signed)
[name], Pt called. Sh saw you on 10/26 for stomach issue. She is still having some pain and wants you to call in more med for her.  Thank you.

## 2015-07-10 NOTE — Telephone Encounter (Signed)
LMOM for pt to return call. 

## 2015-07-10 NOTE — Telephone Encounter (Signed)
Find out if taking omeprazole and carafate.

## 2015-07-14 ENCOUNTER — Ambulatory Visit (INDEPENDENT_AMBULATORY_CARE_PROVIDER_SITE_OTHER): Payer: Medicare Other | Admitting: Family Medicine

## 2015-07-14 ENCOUNTER — Encounter: Payer: Self-pay | Admitting: Family Medicine

## 2015-07-14 VITALS — BP 122/75 | HR 101 | Wt 124.6 lb

## 2015-07-14 DIAGNOSIS — L859 Epidermal thickening, unspecified: Secondary | ICD-10-CM | POA: Diagnosis not present

## 2015-07-14 DIAGNOSIS — K2990 Gastroduodenitis, unspecified, without bleeding: Secondary | ICD-10-CM | POA: Diagnosis not present

## 2015-07-14 DIAGNOSIS — K2970 Gastritis, unspecified, without bleeding: Secondary | ICD-10-CM

## 2015-07-14 DIAGNOSIS — R238 Other skin changes: Secondary | ICD-10-CM

## 2015-07-14 MED ORDER — OMEPRAZOLE 40 MG PO CPDR: 40.0000 mg | DELAYED_RELEASE_CAPSULE | Freq: Two times a day (BID) | ORAL | Status: DC

## 2015-07-14 NOTE — Patient Instructions (Signed)
For your stomach increase your Omeprazole 40mg  tab to twice a day.  One about 30 min before breakfast and one at about 30 min before evening meal.  Once you have been on this regimen for 10 days then drop nighttime dose of you sucralfate.  Then if doing well can drop you midday dose and slowly wean off the sucralfate. If you are still having breakthrough symptoms then we may need ot get you in with a GI doc.   For you scalp recommend a trial of a shampoo with Tea Tree Oil or can try T-gel. Both of these are good for dry, itchy scalp.

## 2015-07-14 NOTE — Progress Notes (Signed)
   Subjective:    Patient ID: [name], female    DOB: December 26, 1945, 69 y.o.   MRN: [id number]  HPI Here for f/u for gastritis - she wasn't able to get the dexilant bc ofher insurance. She is still on her protonix and says the sucralfe is working well but does need a refill. Her sxs is at least 50% better. Her symptoms have gotten a little worse over the weekend when she started to cut back on the Carafate because she didn't have enough tabs left. She had to leave town for a funeral.    she also complains of a dry itchy red scalp. She noticed it started  a few weeks ago. The only new medications are the Carafate and she was rcently switched back to tamoxifen by her oncologist.   Review of Systems     Objective:   Physical Exam  Constitutional: She is oriented to person, place, and time. She appears well-developed and well-nourished.  HENT:  Head: Normocephalic and atraumatic.  Cardiovascular: Normal rate, regular rhythm and normal heart sounds.   Pulmonary/Chest: Effort normal and breath sounds normal.  Abdominal: Soft. Bowel sounds are normal. She exhibits no distension and no mass. There is no tenderness. There is no rebound and no guarding.  Neurological: She is alert and oriented to person, place, and time.  Skin: Skin is warm and dry.  Psychiatric: She has a normal mood and affect. Her behavior is normal.          Assessment & Plan:   gastritis-she was unabe to get the DEXA want. Will increase her omeprazole to twice a day. she says she just felt a 90 day supply so has plenty take it twice a day to see if this is more effective. Then will try to wean the Carafate over a few weeks. If she still having breakthrough symptoms and she really needs referral to GI for further evaluation. Discussed the next steps if she's not improving.  Dry scalp.  We discussed that she can try several treatments over-the-counter. She can try a shampoo that has a T tree oil  or even tried T-Gel. If  this is not helping then please let me know. Also recommend using her hair dryer on a lower heat setting as well. If it's not improving then please let me know. I really don't think this is a side effect from one of her medications.

## 2015-07-27 ENCOUNTER — Other Ambulatory Visit: Payer: Self-pay | Admitting: Oncology

## 2015-07-27 ENCOUNTER — Other Ambulatory Visit: Payer: Self-pay | Admitting: Family Medicine

## 2015-07-28 ENCOUNTER — Ambulatory Visit (INDEPENDENT_AMBULATORY_CARE_PROVIDER_SITE_OTHER): Payer: Medicare Other | Admitting: Family Medicine

## 2015-07-28 ENCOUNTER — Encounter: Payer: Self-pay | Admitting: Family Medicine

## 2015-07-28 ENCOUNTER — Other Ambulatory Visit: Payer: Self-pay

## 2015-07-28 VITALS — BP 139/78 | HR 89 | Temp 98.4°F | Resp 18 | Wt 126.8 lb

## 2015-07-28 DIAGNOSIS — K2100 Gastro-esophageal reflux disease with esophagitis, without bleeding: Secondary | ICD-10-CM

## 2015-07-28 DIAGNOSIS — J0100 Acute maxillary sinusitis, unspecified: Secondary | ICD-10-CM

## 2015-07-28 MED ORDER — AMOXICILLIN-POT CLAVULANATE 875-125 MG PO TABS: 1.0000 | ORAL_TABLET | Freq: Two times a day (BID) | ORAL | Status: DC

## 2015-07-28 MED ORDER — HYDROCODONE-ACETAMINOPHEN 5-325 MG PO TABS: ORAL_TABLET | ORAL | Status: DC

## 2015-07-28 NOTE — Progress Notes (Signed)
   Subjective:    Patient ID: [name], female    DOB: Oct 02, 1945, 69 y.o.   MRN: [id number]  HPI F/U GERD - she says that she was actually doing really well on her regimen. We had increased omeprazole to 40 mg twice a day and she was using Carafate before each meal and at bedtime. She said she did well until Thanksgiving day and since then her symptoms have been flared again. She's feeling a little bit better today. No vomiting or diarrhea or blood in the stool. No brash. No fevers chills or sweats.  Sinus pain and congestion for > 2 weeks. Though she has not had a lot of mucus or drainage. Saw her ENT about 2 weeks ago.. Felt she had allergic rhinitis and gave her prednisone about 2 weeks ago. She did feel better on this. Now have right maxillary pain and persistant HA.  No fever or chills. She is using her nasal steroid spray.    Review of Systems     Objective:   Physical Exam  Constitutional: She is oriented to person, place, and time. She appears well-developed and well-nourished.  HENT:  Head: Normocephalic and atraumatic.  Right Ear: External ear normal.  Left Ear: External ear normal.  Nose: Nose normal.  Mouth/Throat: Oropharynx is clear and moist.  TMs and canals are clear.   Eyes: Conjunctivae and EOM are normal. Pupils are equal, round, and reactive to light.  Neck: Neck supple. No thyromegaly present.  Cardiovascular: Normal rate, regular rhythm and normal heart sounds.   Pulmonary/Chest: Effort normal and breath sounds normal. She has no wheezes.  Lymphadenopathy:    She has no cervical adenopathy.  Neurological: She is alert and oriented to person, place, and time.  Skin: Skin is warm and dry.  Psychiatric: She has a normal mood and affect.          Assessment & Plan:  GERD-I really think we should refer her to GI at this point she continues to have breakthrough symptoms. Recommend possible endoscopy. Also consider gallbladder disease. Will continue with  current regimen until then. Next  Acute sinusitis-will treat with Augmentin. Call if not significantly better in one week. Continue with nasal steroid spray.

## 2015-08-02 ENCOUNTER — Other Ambulatory Visit: Payer: Self-pay | Admitting: Oncology

## 2015-08-03 ENCOUNTER — Telehealth: Payer: Self-pay | Admitting: Oncology

## 2015-08-03 ENCOUNTER — Ambulatory Visit (HOSPITAL_BASED_OUTPATIENT_CLINIC_OR_DEPARTMENT_OTHER): Payer: Medicare Other | Admitting: Oncology

## 2015-08-03 ENCOUNTER — Encounter: Payer: Self-pay | Admitting: Oncology

## 2015-08-03 VITALS — BP 125/66 | HR 90 | Temp 98.0°F | Resp 20 | Ht 67.0 in | Wt 125.6 lb

## 2015-08-03 DIAGNOSIS — C50912 Malignant neoplasm of unspecified site of left female breast: Secondary | ICD-10-CM | POA: Diagnosis not present

## 2015-08-03 DIAGNOSIS — C771 Secondary and unspecified malignant neoplasm of intrathoracic lymph nodes: Secondary | ICD-10-CM | POA: Diagnosis not present

## 2015-08-03 DIAGNOSIS — Z1239 Encounter for other screening for malignant neoplasm of breast: Secondary | ICD-10-CM

## 2015-08-03 DIAGNOSIS — M810 Age-related osteoporosis without current pathological fracture: Secondary | ICD-10-CM

## 2015-08-03 NOTE — Telephone Encounter (Signed)
Appointments made and avs printed for patient °
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
## 2015-08-04 ENCOUNTER — Other Ambulatory Visit: Payer: Self-pay | Admitting: Oncology

## 2015-08-05 ENCOUNTER — Telehealth: Payer: Self-pay | Admitting: *Deleted

## 2015-08-05 ENCOUNTER — Ambulatory Visit: Payer: Medicare Other

## 2015-08-05 NOTE — Telephone Encounter (Signed)
Called patient at home about missed injection appointment left message for her to call back and reschedule

## 2015-08-06 ENCOUNTER — Telehealth: Payer: Self-pay | Admitting: Oncology

## 2015-08-06 NOTE — Telephone Encounter (Signed)
Patients inj has been rescheduled to 12/9  [name]

## 2015-08-07 ENCOUNTER — Other Ambulatory Visit: Payer: Self-pay | Admitting: Physician Assistant

## 2015-08-07 ENCOUNTER — Ambulatory Visit (HOSPITAL_BASED_OUTPATIENT_CLINIC_OR_DEPARTMENT_OTHER): Payer: Medicare Other

## 2015-08-07 VITALS — BP 150/70 | HR 87 | Temp 98.6°F

## 2015-08-07 DIAGNOSIS — M810 Age-related osteoporosis without current pathological fracture: Secondary | ICD-10-CM

## 2015-08-07 MED ORDER — DENOSUMAB 60 MG/ML ~~LOC~~ SOLN
60.0000 mg | Freq: Once | SUBCUTANEOUS | Status: AC
  Administered 2015-08-07: 60 mg via SUBCUTANEOUS
  Filled 2015-08-07: qty 1

## 2015-08-11 ENCOUNTER — Ambulatory Visit: Payer: Medicare Other | Admitting: Family Medicine

## 2015-08-17 ENCOUNTER — Ambulatory Visit: Payer: Medicare Other

## 2015-09-02 ENCOUNTER — Telehealth: Payer: Self-pay | Admitting: Family Medicine

## 2015-09-02 NOTE — Telephone Encounter (Signed)
Ok to take the trazodone about 1 hour after evening meal. Just have to have a little on the stomach and she may do fine moving the med ahead in the evening.   I really want to stay away from habit forming medications for sleep like ambien and lunesta. Marland Kitchen

## 2015-09-02 NOTE — Telephone Encounter (Signed)
Pt called office to see if her Trazodone Rx could be changed to something different. Pt uses this Rx for sleep but she has to take it with food which affects her GERD as she tries to sleep. Will route to PCP for review.

## 2015-09-02 NOTE — Telephone Encounter (Signed)
Called and spoke with Pt's husband. Advised to have Pt take Trazodone one hour after dinner and stressed to continue taking her acid reducing medications. Husband would like PCP to review med list and make sure there are no medications on there that could be causing her stomach irritation. Advised to try this new regime for medication administration and if no change in 2 weeks to give [place] a call back for further consideration. Verbalized understating.

## 2015-09-03 ENCOUNTER — Other Ambulatory Visit: Payer: Self-pay | Admitting: Family Medicine

## 2015-09-03 NOTE — Telephone Encounter (Signed)
She can try stopping her calcium supplement, multivitamin and maybe even her Singulair just to see if it makes a difference. She can stop it for a week and if no different than okay to restart them.

## 2015-09-04 NOTE — Telephone Encounter (Signed)
Left information on Pt's voicemail. Callback info provided for any questions.

## 2015-09-10 ENCOUNTER — Other Ambulatory Visit: Payer: Self-pay | Admitting: *Deleted

## 2015-09-10 MED ORDER — HYDROCODONE-ACETAMINOPHEN 5-325 MG PO TABS: ORAL_TABLET | ORAL | Status: DC

## 2015-09-23 ENCOUNTER — Other Ambulatory Visit: Payer: Self-pay | Admitting: Oncology

## 2015-09-23 DIAGNOSIS — C50919 Malignant neoplasm of unspecified site of unspecified female breast: Secondary | ICD-10-CM

## 2015-10-05 ENCOUNTER — Other Ambulatory Visit: Payer: Self-pay | Admitting: Family Medicine

## 2015-10-22 ENCOUNTER — Other Ambulatory Visit: Payer: Self-pay | Admitting: *Deleted

## 2015-10-22 MED ORDER — HYDROCODONE-ACETAMINOPHEN 5-325 MG PO TABS: ORAL_TABLET | ORAL | Status: DC

## 2015-11-12 ENCOUNTER — Other Ambulatory Visit: Payer: Self-pay | Admitting: Family Medicine

## 2015-11-23 ENCOUNTER — Encounter: Payer: Self-pay | Admitting: Family Medicine

## 2015-11-23 ENCOUNTER — Ambulatory Visit (INDEPENDENT_AMBULATORY_CARE_PROVIDER_SITE_OTHER): Payer: Medicare Other | Admitting: Family Medicine

## 2015-11-23 ENCOUNTER — Telehealth: Payer: Self-pay | Admitting: Family Medicine

## 2015-11-23 ENCOUNTER — Other Ambulatory Visit: Payer: Self-pay | Admitting: *Deleted

## 2015-11-23 VITALS — BP 127/67 | HR 111 | Wt 127.0 lb

## 2015-11-23 DIAGNOSIS — N644 Mastodynia: Secondary | ICD-10-CM

## 2015-11-23 DIAGNOSIS — E079 Disorder of thyroid, unspecified: Secondary | ICD-10-CM

## 2015-11-23 DIAGNOSIS — E782 Mixed hyperlipidemia: Secondary | ICD-10-CM

## 2015-11-23 DIAGNOSIS — Z0000 Encounter for general adult medical examination without abnormal findings: Secondary | ICD-10-CM

## 2015-11-23 MED ORDER — LEVOTHYROXINE SODIUM 50 MCG PO TABS: ORAL_TABLET | ORAL | Status: DC

## 2015-11-23 MED ORDER — TRIAMTERENE-HCTZ 37.5-25 MG PO TABS: 1.0000 | ORAL_TABLET | Freq: Every day | ORAL | Status: DC

## 2015-11-23 MED ORDER — HYDROCODONE-ACETAMINOPHEN 5-325 MG PO TABS: ORAL_TABLET | ORAL | Status: DC

## 2015-11-23 MED ORDER — MUPIROCIN 2 % EX OINT: TOPICAL_OINTMENT | Freq: Three times a day (TID) | CUTANEOUS | Status: AC

## 2015-11-23 MED ORDER — PRAVASTATIN SODIUM 40 MG PO TABS: 40.0000 mg | ORAL_TABLET | Freq: Every day | ORAL | Status: DC

## 2015-11-23 NOTE — Addendum Note (Signed)
Addended by: Teddy Spike on: 11/23/2015 02:54 PM   Modules accepted: Orders, Medications

## 2015-11-23 NOTE — Progress Notes (Addendum)
Subjective:    [name] is a 70 y.o. female who presents for Medicare Annual/Subsequent preventive examination.  Preventive Screening-Counseling & Management  Tobacco History  Smoking status  . Never Smoker   Smokeless tobacco  . Never Used     Problems Prior to Visit 1. Dr. Marko Plume recommend that she stop the Premarin. She felt like she was too high risk for even topical estrogen.  Current Problems (verified) Patient Active Problem List   Diagnosis Date Noted  . Allergy to environmental factors 05/26/2015  . High risk medication use 05/26/2015  . 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 Nipple soreness 11/16/2014  . Recurrent sinusitis   . Breast cancer ([place])   . History of bladder infections   . Thyroid disease   . Unspecified hypothyroidism 11/13/2013  . Trigeminal neuralgia pain 02/08/2011  . Breast carcinoma, left.  Original resection 1999. 01/28/2011  . ALLERGIC CONJUNCTIVITIS 11/27/2009  . GERD 10/07/2009  . VERTIGO 07/24/2009  . NECK PAIN 05/21/2009  . ALLERGIC RHINITIS 09/26/2008  . HYPERLIPIDEMIA, MIXED 12/20/2007  . NEURALGIA, TRIGEMINAL 12/20/2007  . UNSPECIFIED GLAUCOMA 12/20/2007  . Degenerative disc disease, lumbar 12/20/2007  . Osteoporosis 12/20/2007  . Cancer ([place]) 03/29/2006    Medications Prior to Visit Current Outpatient Prescriptions on File Prior to Visit  Medication Sig Dispense Refill  . azelastine (ASTELIN) 137 MCG/SPRAY nasal spray Place 2 sprays into the nose Twice daily.    . Calcium-Magnesium-Vitamin D (CALCIUM 1200+D3 PO) Take by mouth daily.     . carbamazepine (TEGRETOL XR) 200 MG 12 hr tablet TAKE ONE TABLET TWICE DAILY 180 tablet 3  . denosumab (PROLIA) 60 MG/ML SOLN injection Inject 60 mg into the skin every 6 (six) months. Administer in upper arm, thigh, or abdomen    . EPIPEN 2-PAK 0.3 MG/0.3ML SOAJ injection   0  . fish oil-omega-3 fatty  acids 1000 MG capsule Take 2 g by mouth daily.      . fluticasone (FLONASE) 50 MCG/ACT nasal spray Place 1 spray into both nostrils daily.  1  . latanoprost (XALATAN) 0.005 % ophthalmic solution Place 1 drop into both eyes At bedtime.    Marland Kitchen levocetirizine (XYZAL) 5 MG tablet Take 5 mg by mouth daily.      . methocarbamol (ROBAXIN) 500 MG tablet TAKE 1 TABLET BY MOUTH EVERY 8 HOURS AS NEEDED FOR MUSCLE SPASMS 90 tablet 0  . montelukast (SINGULAIR) 10 MG tablet Take 1 tablet (10 mg total) by mouth at bedtime. 180 tablet 1  . nitrofurantoin (MACRODANTIN) 100 MG capsule Take 100 mg by mouth at bedtime.    Marland Kitchen omeprazole (PRILOSEC) 40 MG capsule Take 1 capsule (40 mg total) by mouth 2 (two) times daily. 1 capsule 0  . senna (SENOKOT) 8.6 MG tablet Take 1 tablet by mouth at bedtime.    . sucralfate (CARAFATE) 1 G tablet TAKE 1 TABLET FOUR TIMES DAILY 120 tablet 0  . tamoxifen (NOLVADEX) 20 MG tablet Take 1 tablet (20 mg total) by mouth daily. 30 tablet 5  . traMADol (ULTRAM) 50 MG tablet Take 1 tablet (50 mg total) by mouth 3 (three) times daily as needed for severe pain. for pain 360 tablet 0  . traZODone (DESYREL) 50 MG tablet TAKE ONE-HALF - TWO TABLETS AT BEDTIME AS NEEDED SLEEP 60 tablet 1   No current facility-administered medications on file prior to visit.    Current Medications (verified) Current Outpatient Prescriptions  Medication Sig Dispense Refill  . azelastine (ASTELIN) 137 MCG/SPRAY nasal spray Place 2 sprays into the nose Twice daily.    . Calcium-Magnesium-Vitamin D (CALCIUM 1200+D3 PO) Take by mouth daily.     . carbamazepine (TEGRETOL XR) 200 MG 12 hr tablet TAKE ONE TABLET TWICE DAILY 180 tablet 3  . denosumab (PROLIA) 60 MG/ML SOLN injection Inject 60 mg into the skin every 6 (six) months. Administer in upper arm, thigh, or abdomen    . EPIPEN 2-PAK 0.3 MG/0.3ML SOAJ injection   0  . fish oil-omega-3 fatty acids 1000 MG capsule Take 2 g by mouth daily.      . fluticasone  (FLONASE) 50 MCG/ACT nasal spray Place 1 spray into both nostrils daily.  1  . latanoprost (XALATAN) 0.005 % ophthalmic solution Place 1 drop into both eyes At bedtime.    Marland Kitchen levocetirizine (XYZAL) 5 MG tablet Take 5 mg by mouth daily.      Marland Kitchen levothyroxine (SYNTHROID, LEVOTHROID) 50 MCG tablet TAKE ONE TABLET BY MOUTH ONCE DAILY 90 tablet 1  . methocarbamol (ROBAXIN) 500 MG tablet TAKE 1 TABLET BY MOUTH EVERY 8 HOURS AS NEEDED FOR MUSCLE SPASMS 90 tablet 0  . montelukast (SINGULAIR) 10 MG tablet Take 1 tablet (10 mg total) by mouth at bedtime. 180 tablet 1  . nitrofurantoin (MACRODANTIN) 100 MG capsule Take 100 mg by mouth at bedtime.    Marland Kitchen omeprazole (PRILOSEC) 40 MG capsule Take 1 capsule (40 mg total) by mouth 2 (two) times daily. 1 capsule 0  . pravastatin (PRAVACHOL) 40 MG tablet Take 1 tablet (40 mg total) by mouth daily. 30 tablet 11  . senna (SENOKOT) 8.6 MG tablet Take 1 tablet by mouth at bedtime.    . sucralfate (CARAFATE) 1 G tablet TAKE 1 TABLET FOUR TIMES DAILY 120 tablet 0  . tamoxifen (NOLVADEX) 20 MG tablet Take 1 tablet (20 mg total) by mouth daily. 30 tablet 5  . traMADol (ULTRAM) 50 MG tablet Take 1 tablet (50 mg total) by mouth 3 (three) times daily as needed for severe pain. for pain 360 tablet 0  . traZODone (DESYREL) 50 MG tablet TAKE ONE-HALF - TWO TABLETS AT BEDTIME AS NEEDED SLEEP 60 tablet 1  . triamterene-hydrochlorothiazide (MAXZIDE-25) 37.5-25 MG tablet Take 1 tablet by mouth daily. 30 tablet 6  . HYDROcodone-acetaminophen (NORCO/VICODIN) 5-325 MG tablet TAKE ONE TABLET BY MOUTH EVERY 12 HOURS AS NEEDED FOR PAIN 60 tablet 0  . mupirocin ointment (BACTROBAN) 2 % Apply topically 3 (three) times daily. 22 g 0   No current facility-administered medications for this visit.     Allergies (verified) Other; Ampicillin; Cefadroxil; Doxycycline; Simvastatin; and Dicloxacillin   PAST HISTORY  Family History Family History  Problem Relation Age of Onset  . Cancer Mother      breast  . Stroke Mother   . Hyperlipidemia Mother   . Cancer Father     bladder  . Cancer Son     Breast  . Cancer Maternal Aunt     Breast  . Stomach cancer Paternal Uncle   . Cancer Paternal Grandmother     Mouth  . Stomach cancer Paternal Uncle     Social History Social History  Substance Use Topics  . Smoking status: Never Smoker   . Smokeless tobacco: Never Used  . Alcohol Use: No     Are there smokers in your home (other than you)? No  Risk Factors Current exercise habits: The patient does not participate in  regular exercise at present. she plans on starting a walking program.  Dietary issues discussed: None   Cardiac risk factors: advanced age (older than 71 for men, 66 for women) and sedentary lifestyle.  Depression Screen (Note: if answer to either of the following is "Yes", a more complete depression screening is indicated)   Over the past two weeks, have you felt down, depressed or hopeless? No  Over the past two weeks, have you felt little interest or pleasure in doing things? No  Have you lost interest or pleasure in daily life? No  Do you often feel hopeless? No  Do you cry easily over simple problems? No  Activities of Daily Living In your present state of health, do you have any difficulty performing the following activities?:  Driving? No Managing money?  No Feeding yourself? No Getting from bed to chair? No   Climbing a flight of stairs? No Preparing food and eating?: No Bathing or showering? No Getting dressed: No Getting to the toilet? No Using the toilet:No Moving around from place to place: No In the past year have you fallen or had a near fall?:No     Hearing Difficulties: Yes Do you often ask people to speak up or repeat themselves? Yes Do you experience ringing or noises in your ears? Yes Do you have difficulty understanding soft or whispered voices? Yes   Do you feel that you have a problem with memory? Yes  Do you often  misplace items? No  Do you feel safe at home?  Yes  Cognitive Testing  Alert? Yes  Normal Appearance?Yes  Oriented to person? Yes  Place? Yes   Time? Yes  Recall of three objects?  Yes  Can perform simple calculations? Yes  Displays appropriate judgment?Yes  Can read the correct time from a watch face?Yes    6CIT score of 8 out of 28. Maintenance treat some mild cognitive impairment.   Advanced Directives have been discussed with the patient? No  List the Names of Other Physician/Practitioners you currently use: 1. Dr. Donneta Romberg ( allergy) 2. Dr. Benjamine Mola 3. Dr. Marko Plume.   Indicate any recent Medical Services you may have received from other than [facility] providers in the past year (date may be approximate).  Immunization History  Administered Date(s) Administered  . Influenza Whole 04/21/2011  . Influenza,inj,Quad PF,36+ Mos 05/27/2013, 04/21/2014, 05/25/2015  . Pneumococcal Conjugate-13 11/13/2013  . Pneumococcal Polysaccharide-23 01/28/2015    Screening Tests Health Maintenance  Topic Date Due  . Hepatitis C Screening  July 22, 1946  . INFLUENZA VACCINE  03/29/2016  . MAMMOGRAM  11/24/2016  . COLONOSCOPY  02/09/2020  . TETANUS/TDAP  09/12/2021  . DEXA SCAN  Completed  . ZOSTAVAX  Addressed  . PNA vac Low Risk Adult  Completed    All answers were reviewed with the patient and necessary referrals were made:  METHENEY,CATHERINE, MD   11/23/2015   History reviewed: allergies, current medications, past family history, past medical history, past social history, past surgical history and problem list  Review of Systems A comprehensive review of systems was negative.    Objective:     Vision by Snellen chart: eye exam UTD.    Body mass index is 19.89 kg/(m^2). BP 127/67 mmHg  Pulse 111  Wt 127 lb (57.607 kg)  SpO2 96%  BP 127/67 mmHg  Pulse 111  Wt 127 lb (57.607 kg)  SpO2 96% General appearance: alert, cooperative and appears stated age Head: Normocephalic, without  obvious abnormality, atraumatic Eyes: conj  clear, EOMI, PEERLA Ears: normal TM's and external ear canals both ears Nose: Nares normal. Septum midline. Mucosa normal. No drainage or sinus tenderness. Throat: lips, mucosa, and tongue normal; teeth and gums normal Neck: no adenopathy, no carotid bruit, no JVD, supple, symmetrical, trachea midline and thyroid not enlarged, symmetric, no tenderness/mass/nodules Back: symmetric, no curvature. ROM normal. No CVA tenderness. Lungs: clear to auscultation bilaterally Breasts: small approx 4 mm red area at teh base of teh nippel on the left breast Heart: regular rate and rhythm, S1, S2 normal, no murmur, click, rub or gallop Abdomen: soft, non-tender; bowel sounds normal; no masses,  no organomegaly Extremities: extremities normal, atraumatic, no cyanosis or edema Pulses: 2+ and symmetric Skin: Skin color, texture, turgor normal. No rashes or lesions Lymph nodes: Cervical adenopathy: nl and Axillary adenopathy: nl Neurologic: Alert and oriented X 3, normal strength and tone. Normal symmetric reflexes. Normal coordination and gait     Assessment:     Medicare Wellness exam       Plan:     During the course of the visit the patient was educated and counseled about appropriate screening and preventive services including:    shingles vaccine - Information given. Encouraged to check with her insurance on coverage.  Due for lipids.   Last colonoscopy June 2011. She is scheduled in a couple months for repeat colonoscopy.  Nipple dermatitis - will tx with mupiricin.    Diet review for nutrition referral? Yes ____  Not Indicated __x__   Patient Instructions (the written plan) was given to the patient.  Medicare Attestation I have personally reviewed: The patient's medical and social history Their use of alcohol, tobacco or illicit drugs Their current medications and supplements The patient's functional ability including ADLs,fall risks,  home safety risks, cognitive, and hearing and visual impairment Diet and physical activities Evidence for depression or mood disorders  The patient's weight, height, BMI, and visual acuity have been recorded in the chart.  I have made referrals, counseling, and provided education to the patient based on review of the above and I have provided the patient with a written personalized care plan for preventive services.     METHENEY,CATHERINE, MD   11/23/2015

## 2015-11-23 NOTE — Telephone Encounter (Signed)
These call patient: Normal screening test that we did for memory she did miss a few questions. I would encourage her to follow up appointment for memory evaluation.

## 2015-11-24 LAB — LIPID PANEL
CHOLESTEROL: 229 mg/dL — AB (ref 125–200)
HDL: 91 mg/dL (ref >=46)
LDL Cholesterol: 97 mg/dL (ref <130)
Total CHOL/HDL Ratio: 2.5 Ratio (ref <=5.0)
Triglycerides: 206 mg/dL — ABNORMAL HIGH (ref <150)
VLDL: 41 mg/dL — ABNORMAL HIGH (ref <30)

## 2015-11-24 LAB — TSH: TSH: 4.17 m[IU]/L

## 2015-11-27 ENCOUNTER — Ambulatory Visit
Admission: RE | Admit: 2015-11-27 | Discharge: 2015-11-27 | Disposition: A | Discharge status: Home / Self Care | Payer: Medicare Other | Source: Ambulatory Visit | Attending: Oncology | Admitting: Oncology

## 2015-11-27 DIAGNOSIS — C771 Secondary and unspecified malignant neoplasm of intrathoracic lymph nodes: Secondary | ICD-10-CM

## 2015-11-27 DIAGNOSIS — Z1239 Encounter for other screening for malignant neoplasm of breast: Secondary | ICD-10-CM

## 2015-12-30 ENCOUNTER — Other Ambulatory Visit: Payer: Self-pay | Admitting: Family Medicine

## 2015-12-30 MED ORDER — HYDROCODONE-ACETAMINOPHEN 5-325 MG PO TABS: ORAL_TABLET | ORAL | Status: DC

## 2016-01-29 ENCOUNTER — Other Ambulatory Visit: Payer: Self-pay

## 2016-01-29 DIAGNOSIS — C771 Secondary and unspecified malignant neoplasm of intrathoracic lymph nodes: Secondary | ICD-10-CM

## 2016-01-31 ENCOUNTER — Other Ambulatory Visit: Payer: Self-pay | Admitting: Oncology

## 2016-02-01 ENCOUNTER — Encounter: Payer: Self-pay | Admitting: Oncology

## 2016-02-01 ENCOUNTER — Telehealth: Payer: Self-pay | Admitting: Oncology

## 2016-02-01 ENCOUNTER — Ambulatory Visit (HOSPITAL_BASED_OUTPATIENT_CLINIC_OR_DEPARTMENT_OTHER): Payer: Medicare Other | Admitting: Oncology

## 2016-02-01 ENCOUNTER — Other Ambulatory Visit (HOSPITAL_BASED_OUTPATIENT_CLINIC_OR_DEPARTMENT_OTHER): Payer: Medicare Other

## 2016-02-01 ENCOUNTER — Other Ambulatory Visit: Payer: Self-pay | Admitting: Neurology

## 2016-02-01 VITALS — BP 144/79 | HR 74 | Temp 98.9°F | Resp 18 | Wt 130.4 lb

## 2016-02-01 DIAGNOSIS — M810 Age-related osteoporosis without current pathological fracture: Secondary | ICD-10-CM

## 2016-02-01 DIAGNOSIS — C50912 Malignant neoplasm of unspecified site of left female breast: Secondary | ICD-10-CM | POA: Diagnosis not present

## 2016-02-01 DIAGNOSIS — C771 Secondary and unspecified malignant neoplasm of intrathoracic lymph nodes: Secondary | ICD-10-CM

## 2016-02-01 DIAGNOSIS — M818 Other osteoporosis without current pathological fracture: Secondary | ICD-10-CM

## 2016-02-01 DIAGNOSIS — T386X5A Adverse effect of antigonadotrophins, antiestrogens, antiandrogens, not elsewhere classified, initial encounter: Secondary | ICD-10-CM

## 2016-02-01 DIAGNOSIS — Z79899 Other long term (current) drug therapy: Secondary | ICD-10-CM

## 2016-02-01 LAB — CBC WITH DIFFERENTIAL/PLATELET
BASO%: 0.3 % (ref 0.0–2.0)
BASOS ABS: 0 10*3/uL (ref 0.0–0.1)
EOS ABS: 0.1 10*3/uL (ref 0.0–0.5)
EOS%: 1.7 % (ref 0.0–7.0)
HEMATOCRIT: 38.3 % (ref 34.8–46.6)
HGB: 12.7 g/dL (ref 11.6–15.9)
LYMPH%: 38.5 % (ref 14.0–49.7)
MCH: 31.2 pg (ref 25.1–34.0)
MCHC: 33.2 g/dL (ref 31.5–36.0)
MCV: 94.1 fL (ref 79.5–101.0)
MONO#: 0.3 10*3/uL (ref 0.1–0.9)
MONO%: 8.6 % (ref 0.0–14.0)
NEUT#: 1.8 10*3/uL (ref 1.5–6.5)
NEUT%: 50.9 % (ref 38.4–76.8)
Platelets: 188 10*3/uL (ref 145–400)
RBC: 4.07 10*6/uL (ref 3.70–5.45)
RDW: 12.2 % (ref 11.2–14.5)
WBC: 3.6 10*3/uL — ABNORMAL LOW (ref 3.9–10.3)
lymph#: 1.4 10*3/uL (ref 0.9–3.3)

## 2016-02-01 LAB — COMPREHENSIVE METABOLIC PANEL
ALK PHOS: 38 U/L — AB (ref 40–150)
ALT: 13 U/L (ref 0–55)
AST: 23 U/L (ref 5–34)
Albumin: 3.9 g/dL (ref 3.5–5.0)
Anion Gap: 7 mEq/L (ref 3–11)
BUN: 21.7 mg/dL (ref 7.0–26.0)
CALCIUM: 9.6 mg/dL (ref 8.4–10.4)
CHLORIDE: 103 meq/L (ref 98–109)
CO2: 31 mEq/L — ABNORMAL HIGH (ref 22–29)
Creatinine: 1.1 mg/dL (ref 0.6–1.1)
EGFR: 51 mL/min/{1.73_m2} — AB (ref >90)
Glucose: 100 mg/dl (ref 70–140)
Potassium: 4.1 mEq/L (ref 3.5–5.1)
SODIUM: 141 meq/L (ref 136–145)
Total Bilirubin: <0.3 mg/dL (ref 0.20–1.20)
Total Protein: 6.7 g/dL (ref 6.4–8.3)

## 2016-02-01 NOTE — Telephone Encounter (Signed)
appt made and avs printed °
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
## 2016-02-10 ENCOUNTER — Other Ambulatory Visit: Payer: Self-pay | Admitting: Physician Assistant

## 2016-02-11 ENCOUNTER — Other Ambulatory Visit: Payer: Self-pay | Admitting: *Deleted

## 2016-02-11 MED ORDER — HYDROCODONE-ACETAMINOPHEN 5-325 MG PO TABS: ORAL_TABLET | ORAL | Status: DC

## 2016-03-03 ENCOUNTER — Other Ambulatory Visit: Payer: Self-pay | Admitting: Family Medicine

## 2016-03-17 ENCOUNTER — Ambulatory Visit (INDEPENDENT_AMBULATORY_CARE_PROVIDER_SITE_OTHER): Payer: Medicare Other | Admitting: Adult Health

## 2016-03-17 ENCOUNTER — Encounter: Payer: Self-pay | Admitting: Adult Health

## 2016-03-17 VITALS — BP 124/72 | HR 76 | Ht 67.0 in | Wt 132.0 lb

## 2016-03-17 DIAGNOSIS — G500 Trigeminal neuralgia: Secondary | ICD-10-CM | POA: Diagnosis not present

## 2016-03-17 DIAGNOSIS — Z5181 Encounter for therapeutic drug level monitoring: Secondary | ICD-10-CM | POA: Diagnosis not present

## 2016-03-17 NOTE — Progress Notes (Signed)
PATIENT: [name] DOB: [birth year]  REASON FOR VISIT: follow up- trigeminal neuralgia HISTORY FROM: patient  HISTORY OF PRESENT ILLNESS:  Today 03/17/2016 : [name] is a 70 year old female with a history of trigeminal neuralgia. She returns today for follow-up. She continues on Tegretol extended release 200 mg twice a day. She reports that she has not had any discomfort from her trigeminal neuralgia. She tolerates the medication well. No change in her gait or balance. Denies any new neurological symptoms. He returns today for evaluation.  HISTORY 03/18/15 (MM):  70 year old female with a history of trigeminal neuralgia. She returns today for follow-up. The patient continues to take Tegretol-XR 200 mg twice a day. She states that this continues to control her pain. Denies any facial pain. Denies trouble with her gait or balance. Patient is currently in physical therapy for a hip injury. She states that this is going well. Denies any new neurological symptoms. She returns today for an evaluation.  HISTORY 07/18/14: [name] is a 70 year old female with a history of trigeminal neuralgia. She returns today for follow-up. She is currently taking Tegretol XR 200 mg BID. She reports that this has been beneficial. Denies any facial pain. No new neurological complaints. No new medical history.  HISTORY 01/02/14 [facility]): [name] is a 70 year old right-handed Caucasian female, alone at today's clinical visit.Last seen in January 2014 by Hoyle Sauer. 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 She presenting with recurrent episode of left facial discomfort, the initial episode was in 2002, following a left upper molar filling procedure, she described left facial fullness, achy sensation, constant annoying pain, she was evaluated  by [facility] neurologist Dr.Hayworth at that time, reported normal MRI of the brain, left facial pain last about 6-7 months, resolved by taking tegretol. The pain was at the left cheek area. She even underwent left upper molar extraction then. Since December 2010, she was treated with multiple rounds of antibiotic and prednisone for presumed sinus infection, because of left facial fullness sensation again, annoying pain in the left cheek V2 distribtion, very similar to what she experienced in 2002, no shooting pain, no hypersensitivity, no rash. No left facial weakness, or hearing changes. She recently had CT of the sinus that was normal, there is no evidence of sinusitis. MRI brain was essentially normal, there is only mild nonspecific small vessel disease. She was restarted on tegretol since 2011, which has been very helpful, she is taking carbamazepine XR 100mg , 2 tablet twice a day tabs bid, She is also on polypharmacy treatment, including Fosamax, calcium, vitamin D, Premarin, hydrocodone, Ativan, Pravachol, tramadol, Maxzide. She is also taking, Letrozole UPDATE May 7th 2015:If she forgot to take tegretol xr 100mg  ii bid, she does noticed recurrent of her left facial pain, she does not have significant side effect from the medications,   REVIEW OF SYSTEMS: Out of a complete 14 system review of symptoms, the patient complains only of the following symptoms, and all other reviewed systems are negative.  See history of present illness  ALLERGIES: Allergies  Allergen Reactions  . Other Rash  . Cefadroxil     REACTION: rash  . Doxycycline Itching  . Simvastatin Other (See Comments)    Aches and memory loss  . Ampicillin Rash  . Dicloxacillin Rash    HOME MEDICATIONS: Outpatient Prescriptions  Prior to Visit  Medication Sig Dispense Refill  . azelastine (ASTELIN) 137 MCG/SPRAY nasal spray Place 2 sprays into the nose Twice daily.    . Calcium-Magnesium-Vitamin D (CALCIUM 1200+D3 PO) Take  by mouth daily.     . carbamazepine (TEGRETOL XR) 200 MG 12 hr tablet TAKE ONE TABLET TWICE DAILY 180 tablet 0  . denosumab (PROLIA) 60 MG/ML SOLN injection Inject 60 mg into the skin every 6 (six) months. Administer in upper arm, thigh, or abdomen    . EPIPEN 2-PAK 0.3 MG/0.3ML SOAJ injection Reported on 02/01/2016  0  . fish oil-omega-3 fatty acids 1000 MG capsule Take 2 g by mouth daily.      . fluticasone (FLONASE) 50 MCG/ACT nasal spray Place 1 spray into both nostrils daily.  1  . HYDROcodone-acetaminophen (NORCO/VICODIN) 5-325 MG tablet TAKE ONE TABLET BY MOUTH EVERY 12 HOURS AS NEEDED FOR PAIN 60 tablet 0  . latanoprost (XALATAN) 0.005 % ophthalmic solution Place 1 drop into both eyes At bedtime.    Marland Kitchen levocetirizine (XYZAL) 5 MG tablet Take 5 mg by mouth daily.      Marland Kitchen levothyroxine (SYNTHROID, LEVOTHROID) 50 MCG tablet TAKE ONE TABLET BY MOUTH ONCE DAILY 90 tablet 0  . methocarbamol (ROBAXIN) 500 MG tablet TAKE 1 TABLET BY MOUTH EVERY 8 HOURS AS NEEDED FOR MUSCLE SPASMS 90 tablet 0  . montelukast (SINGULAIR) 10 MG tablet Take 1 tablet (10 mg total) by mouth at bedtime. 180 tablet 1  . omeprazole (PRILOSEC) 40 MG capsule TAKE ONE CAPSULE DAILY 30 capsule 6  . pravastatin (PRAVACHOL) 40 MG tablet Take 1 tablet (40 mg total) by mouth daily. 30 tablet 11  . senna (SENOKOT) 8.6 MG tablet Take 2 tablets by mouth at bedtime.     . senna-docusate (SENOKOT-S) 8.6-50 MG tablet Take 2 tablets by mouth at bedtime.    . sucralfate (CARAFATE) 1 G tablet TAKE 1 TABLET FOUR TIMES DAILY 120 tablet 0  . tamoxifen (NOLVADEX) 20 MG tablet Take 1 tablet (20 mg total) by mouth daily. 30 tablet 5  . triamterene-hydrochlorothiazide (MAXZIDE-25) 37.5-25 MG tablet Take 1 tablet by mouth daily. 30 tablet 6  . trimethoprim (TRIMPEX) 100 MG tablet Take 100 mg by mouth at bedtime.  3   No facility-administered medications prior to visit.    PAST MEDICAL HISTORY: Past Medical History  Diagnosis Date  .  Hyperlipidemia   . Recurrent sinusitis   . Breast cancer ([place]) 1999    T1N0 LEFT BREAST  . Cancer ([place]) 03-2006    METASTATIC BREAST TO APPARENT ISOLATED LEFT INTERNAL MAMMARY NODE  . Osteoporosis 03-04-2011     LAST BONE DENSITY 03-04-2011  . Trigeminal neuralgia   . Degenerative disc disease   . History of bladder infections     RECURRENT  . Thyroid disease     PAST SURGICAL HISTORY: Past Surgical History  Procedure Laterality Date  . Bladder suspension  2005  . Left breast lumpectomy  1999  . Cancerous ln removed  2008  . Breast surgery  1999    Lumpectomy L Br  . Breast surgery  2007    Lymph Node Removal L Chest Wall  . Abdominal hysterectomy  2005    Complete    FAMILY HISTORY: Family History  Problem Relation Age of Onset  . Cancer Mother     breast  . Stroke Mother   . Hyperlipidemia Mother   . Cancer Father     bladder  . Colon cancer Father   .  Cancer Son     Breast  . Cancer Maternal Aunt     Breast  . Stomach cancer Paternal Uncle   . Cancer Paternal Grandmother     Mouth  . Stomach cancer Paternal Uncle     SOCIAL HISTORY: Social History   Social History  . Marital Status: Married    Spouse Name: [name]   . Number of Children: 3  . Years of Education: RN   Occupational History  .      Retired- Therapist, sports   Social History Main Topics  . Smoking status: Never Smoker   . Smokeless tobacco: Never Used  . Alcohol Use: No  . Drug Use: No  . Sexual Activity: Not on file     Comment: housewife, married, 3 kids.   Other Topics Concern  . Not on file   Social History Narrative   Patient lives at home with her husband [name])   Retired - RN   Right handed.   Caffeine two cups coffee daily and one sweet tea.      PHYSICAL EXAM  Filed Vitals:   03/17/16 1032  BP: 124/72  Pulse: 76  Height: 5\' 7"  (1.702 m)  Weight: 132 lb (59.875 kg)   Body mass index is 20.67 kg/(m^2).  Generalized: Well developed, in no acute distress   Neurological  examination  Mentation: Alert oriented to time, place, history taking. Follows all commands speech and language fluent Cranial nerve II-XII: Pupils were equal round reactive to light. Extraocular movements were full, visual field were full on confrontational test. Facial sensation and strength were normal. Uvula tongue midline. Head turning and shoulder shrug  were normal and symmetric. Motor: The motor testing reveals 5 over 5 strength of all 4 extremities. Good symmetric motor tone is noted throughout.  Sensory: Sensory testing is intact to soft touch on all 4 extremities. No evidence of extinction is noted.  Coordination: Cerebellar testing reveals good finger-nose-finger and heel-to-shin bilaterally.  Gait and station: Gait is normal. Tandem gait is normal. Romberg is negative. No drift is seen.  Reflexes: Deep tendon reflexes are symmetric and normal bilaterally.   DIAGNOSTIC DATA (LABS, IMAGING, TESTING) - I reviewed patient records, labs, notes, testing and imaging myself where available.  Lab Results  Component Value Date   WBC 3.6* 02/01/2016   HGB 12.7 02/01/2016   HCT 38.3 02/01/2016   MCV 94.1 02/01/2016   PLT 188 02/01/2016      Component Value Date/Time   NA 141 02/01/2016 1231   NA 141 07/16/2014 1021   K 4.1 02/01/2016 1231   K 4.2 07/16/2014 1021   CL 100 07/16/2014 1021   CL 98 10/02/2012 1125   CO2 31* 02/01/2016 1231   CO2 31 07/16/2014 1021   GLUCOSE 100 02/01/2016 1231   GLUCOSE 78 07/16/2014 1021   GLUCOSE 80 10/02/2012 1125   BUN 21.7 02/01/2016 1231   BUN 20 07/16/2014 1021   CREATININE 1.1 02/01/2016 1231   CREATININE 0.78 07/16/2014 1021   CREATININE 0.72 01/11/2012 0922   CALCIUM 9.6 02/01/2016 1231   CALCIUM 9.7 07/16/2014 1021   PROT 6.7 02/01/2016 1231   PROT 6.6 07/16/2014 1021   ALBUMIN 3.9 02/01/2016 1231   ALBUMIN 4.7 07/16/2014 1021   AST 23 02/01/2016 1231   AST 22 07/16/2014 1021   ALT 13 02/01/2016 1231   ALT 16 07/16/2014 1021    ALKPHOS 38* 02/01/2016 1231   ALKPHOS 40 07/16/2014 1021   BILITOT <0.30 02/01/2016 1231  BILITOT 0.4 07/16/2014 1021   GFRNONAA >89 11/13/2013 1031   GFRAA >89 11/13/2013 1031   Lab Results  Component Value Date   CHOL 229* 11/23/2015   HDL 91 11/23/2015   LDLCALC 97 11/23/2015   TRIG 206* 11/23/2015   CHOLHDL 2.5 11/23/2015     Lab Results  Component Value Date   TSH 4.17 11/23/2015      ASSESSMENT AND PLAN 70 y.o. year old female  has a past medical history of Hyperlipidemia; Recurrent sinusitis; Breast cancer ([place]) (1999); Cancer ([place]) (03-2006); Osteoporosis (03-04-2011); Trigeminal neuralgia; Degenerative disc disease; History of bladder infections; and Thyroid disease. here with:  1. Trigeminal neuralgia  Overall the patient is doing well. She will continue on Tegretol-XR 200 mg twice a day. I will check blood work today. Patient will follow-up in one year with Dr. Krista Blue.     Ward Givens, MSN, NP-C 03/17/2016, 10:45 AM [facility] [address] [place], [place] [address] [phone]

## 2016-03-17 NOTE — Progress Notes (Signed)
I have read the note, and I agree with the clinical assessment and plan.  [name],[name]   

## 2016-03-17 NOTE — Patient Instructions (Signed)
Continue carbamazepine Blood work today If your symptoms worsen or you develop new symptoms please let us know.

## 2016-03-18 LAB — COMPREHENSIVE METABOLIC PANEL
ALBUMIN: 4.5 g/dL (ref 3.6–4.8)
ALT: 12 IU/L (ref 0–32)
AST: 27 IU/L (ref 0–40)
Albumin/Globulin Ratio: 2.6 — ABNORMAL HIGH (ref 1.2–2.2)
Alkaline Phosphatase: 35 IU/L — ABNORMAL LOW (ref 39–117)
BUN / CREAT RATIO: 24 (ref 12–28)
BUN: 21 mg/dL (ref 8–27)
Bilirubin Total: 0.2 mg/dL (ref 0.0–1.2)
CALCIUM: 9.9 mg/dL (ref 8.7–10.3)
CO2: 28 mmol/L (ref 18–29)
CREATININE: 0.88 mg/dL (ref 0.57–1.00)
Chloride: 99 mmol/L (ref 96–106)
GFR calc Af Amer: 78 mL/min/{1.73_m2} (ref >59)
GFR, EST NON AFRICAN AMERICAN: 67 mL/min/{1.73_m2} (ref >59)
GLOBULIN, TOTAL: 1.7 g/dL (ref 1.5–4.5)
Glucose: 87 mg/dL (ref 65–99)
Potassium: 4.8 mmol/L (ref 3.5–5.2)
SODIUM: 144 mmol/L (ref 134–144)
Total Protein: 6.2 g/dL (ref 6.0–8.5)

## 2016-03-18 LAB — CBC WITH DIFFERENTIAL/PLATELET
BASOS: 0 %
Basophils Absolute: 0 10*3/uL (ref 0.0–0.2)
EOS (ABSOLUTE): 0.1 10*3/uL (ref 0.0–0.4)
EOS: 2 %
HEMATOCRIT: 38.2 % (ref 34.0–46.6)
Hemoglobin: 12.4 g/dL (ref 11.1–15.9)
Immature Grans (Abs): 0 10*3/uL (ref 0.0–0.1)
Immature Granulocytes: 0 %
LYMPHS ABS: 1.8 10*3/uL (ref 0.7–3.1)
Lymphs: 46 %
MCH: 30.2 pg (ref 26.6–33.0)
MCHC: 32.5 g/dL (ref 31.5–35.7)
MCV: 93 fL (ref 79–97)
MONOS ABS: 0.2 10*3/uL (ref 0.1–0.9)
Monocytes: 6 %
Neutrophils Absolute: 1.8 10*3/uL (ref 1.4–7.0)
Neutrophils: 46 %
Platelets: 203 10*3/uL (ref 150–379)
RBC: 4.1 x10E6/uL (ref 3.77–5.28)
RDW: 13.5 % (ref 12.3–15.4)
WBC: 3.9 10*3/uL (ref 3.4–10.8)

## 2016-03-18 LAB — CARBAMAZEPINE LEVEL, TOTAL: Carbamazepine (Tegretol), S: 4.7 ug/mL (ref 4.0–12.0)

## 2016-03-21 ENCOUNTER — Telehealth: Payer: Self-pay | Admitting: *Deleted

## 2016-03-21 NOTE — Telephone Encounter (Signed)
I spoke to pt and relayed that her lab results were unremarkable.  She verbalized understaniding.

## 2016-03-21 NOTE — Telephone Encounter (Signed)
-----   Message from Ward Givens, NP sent at 03/18/2016  2:06 PM EDT ----- Lab work is unremarkable. Please call the patient. Thanks.

## 2016-03-22 ENCOUNTER — Other Ambulatory Visit: Payer: Self-pay

## 2016-03-22 MED ORDER — HYDROCODONE-ACETAMINOPHEN 5-325 MG PO TABS: ORAL_TABLET | ORAL | 0 refills | Status: DC

## 2016-04-27 ENCOUNTER — Telehealth: Payer: Self-pay | Admitting: Neurology

## 2016-04-27 ENCOUNTER — Other Ambulatory Visit: Payer: Self-pay | Admitting: *Deleted

## 2016-04-27 MED ORDER — CARBAMAZEPINE ER 200 MG PO TB12: 200.0000 mg | ORAL_TABLET | Freq: Two times a day (BID) | ORAL | 1 refills | Status: DC

## 2016-04-27 NOTE — Telephone Encounter (Signed)
Rx sent to requested pharmacy

## 2016-04-27 NOTE — Telephone Encounter (Signed)
Pt request refill for carbamazepine (TEGRETOL XR) 200 MG 12 hr tablet sent to [facility]

## 2016-04-29 ENCOUNTER — Encounter: Payer: Self-pay | Admitting: Physician Assistant

## 2016-04-29 ENCOUNTER — Ambulatory Visit (INDEPENDENT_AMBULATORY_CARE_PROVIDER_SITE_OTHER): Payer: Medicare Other | Admitting: Physician Assistant

## 2016-04-29 ENCOUNTER — Other Ambulatory Visit: Payer: Self-pay

## 2016-04-29 VITALS — BP 130/70 | HR 107 | Temp 98.4°F | Ht 67.0 in | Wt 130.0 lb

## 2016-04-29 DIAGNOSIS — J320 Chronic maxillary sinusitis: Secondary | ICD-10-CM

## 2016-04-29 MED ORDER — METHYLPREDNISOLONE 4 MG PO TBPK: ORAL_TABLET | ORAL | 0 refills | Status: DC

## 2016-04-29 MED ORDER — LEVOFLOXACIN 500 MG PO TABS: 500.0000 mg | ORAL_TABLET | Freq: Every day | ORAL | 0 refills | Status: DC

## 2016-04-29 MED ORDER — HYDROCODONE-ACETAMINOPHEN 5-325 MG PO TABS: ORAL_TABLET | ORAL | 0 refills | Status: DC

## 2016-04-29 NOTE — Patient Instructions (Signed)

## 2016-04-29 NOTE — Progress Notes (Signed)
   Subjective:    Patient ID: [name], female    DOB: May 08, 1946, 70 y.o.   MRN: [id number]  HPI Patient is a 70 year old female who presents to the clinic with left sided facial pain, headache and nasal drainage. She has a history of recurrent sinus infections. At work this week she and held some dust. She thinks this could've been was started. She is going on 5 days of symptoms. She has tried Zyrtec and Claritin. She has not had a lot of relief. She denies any fever, chills, cough, wheezing or abdominal pain.   Review of Systems  All other systems reviewed and are negative.      Objective:   Physical Exam  Constitutional: She is oriented to person, place, and time. She appears well-developed and well-nourished.  HENT:  Head: Normocephalic and atraumatic.  Right Ear: External ear normal.  Left Ear: External ear normal.  Mouth/Throat: Oropharynx is clear and moist. No oropharyngeal exudate.  TMs clear bilaterally.  Oropharynx erythematous without any tonsillar swelling or exudate.  Bilateral nasal turbinates red and swollen.  Tenderness over left maxillary sinus to palpation.  Eyes: Conjunctivae are normal. Right eye exhibits no discharge. Left eye exhibits no discharge.  Neck: Normal range of motion. Neck supple.  Cardiovascular: Normal rate, regular rhythm and normal heart sounds.   Pulmonary/Chest: Effort normal and breath sounds normal. She has no wheezes.  Lymphadenopathy:    She has no cervical adenopathy.  Neurological: She is alert and oriented to person, place, and time.  Psychiatric: She has a normal mood and affect. Her behavior is normal.          Assessment & Plan:  Left sided maxillary sinusitis-could have certainly been induced by allergies. Sent Levaquin and Medrol Dosepak. Discuss other symptomatic care. Handout given. Follow-up as needed.

## 2016-05-03 ENCOUNTER — Other Ambulatory Visit: Payer: Self-pay | Admitting: Oncology

## 2016-05-03 DIAGNOSIS — C50919 Malignant neoplasm of unspecified site of unspecified female breast: Secondary | ICD-10-CM

## 2016-05-04 ENCOUNTER — Other Ambulatory Visit: Payer: Self-pay | Admitting: Oncology

## 2016-05-04 DIAGNOSIS — C50919 Malignant neoplasm of unspecified site of unspecified female breast: Secondary | ICD-10-CM

## 2016-05-25 ENCOUNTER — Encounter: Payer: Self-pay | Admitting: Family Medicine

## 2016-05-25 ENCOUNTER — Ambulatory Visit (INDEPENDENT_AMBULATORY_CARE_PROVIDER_SITE_OTHER): Payer: Medicare Other | Admitting: Family Medicine

## 2016-05-25 VITALS — BP 121/74 | HR 91

## 2016-05-25 DIAGNOSIS — M5136 Other intervertebral disc degeneration, lumbar region: Secondary | ICD-10-CM

## 2016-05-25 DIAGNOSIS — M5030 Other cervical disc degeneration, unspecified cervical region: Secondary | ICD-10-CM

## 2016-05-25 DIAGNOSIS — J3489 Other specified disorders of nose and nasal sinuses: Secondary | ICD-10-CM | POA: Diagnosis not present

## 2016-05-25 MED ORDER — SULFAMETHOXAZOLE-TRIMETHOPRIM 800-160 MG PO TABS: 1.0000 | ORAL_TABLET | Freq: Two times a day (BID) | ORAL | 0 refills | Status: DC

## 2016-05-25 NOTE — Progress Notes (Signed)
   Subjective:    Patient ID: [name], female    DOB: 1946/05/05, 70 y.o.   MRN: [id number]  HPI Patient follows up today for chronic pain management. She is currently on hydrocodone 5/325 and get 60 tabs per month. She is a diagnosis of degenerative disc disease of the lumbar and cervical spine as well as trigeminal neuralgia. Has been trying to walk for 3 days a week. Sooner has been walk together.  She is actually concerned that she may have another sinus infection. For the last 3 days she's had sinus headache and pressure on the left facial cheek and left 4 head. She was actually treated for sinus infection about a month ago with Levaquin. She did feel better afterwards until a few days ago. She denies any fever or significant nasal congestion but she has had some postnasal drip. She does have a history significant for allergic rhinitis and does take medication and use a nasal steroid spray daily for this. No fevers or chills. No worsening or alleviating factors. She has been doing a lot of woodworking and has been exposed to wood dust.   Review of Systems     Objective:   Physical Exam  Constitutional: She is oriented to person, place, and time. She appears well-developed and well-nourished.  HENT:  Head: Normocephalic and atraumatic.  Right Ear: External ear normal.  Left Ear: External ear normal.  Nose: Nose normal.  Mouth/Throat: Oropharynx is clear and moist.  TMs and canals are clear.   Eyes: Conjunctivae and EOM are normal. Pupils are equal, round, and reactive to light.  Neck: Neck supple. No thyromegaly present.  Cardiovascular: Normal rate, regular rhythm and normal heart sounds.   Pulmonary/Chest: Effort normal and breath sounds normal. She has no wheezes.  Lymphadenopathy:    She has no cervical adenopathy.  Neurological: She is alert and oriented to person, place, and time.  Skin: Skin is warm and dry.  Psychiatric: She has a normal mood and affect. Her behavior  is normal.        Assessment & Plan:   Degenerative disc disease of lumbar and cervical spine- Patient completed opioid wrist full assessment.Opioid risk stool completed today. Pain management contract today and completed on 03/22/2016.  Left maxillary sinus pain/pressure-may be due to just irritation versus actual infection. A medical head and prepped prescription for her but encouraged her to give it a few days to see if she starts to feel better on her own. But certainly she can fill it if she feels like she is getting worse or just not improving through the weekend.

## 2016-06-03 ENCOUNTER — Telehealth: Payer: Self-pay | Admitting: *Deleted

## 2016-06-03 MED ORDER — HYDROCODONE-ACETAMINOPHEN 5-325 MG PO TABS: ORAL_TABLET | ORAL | 0 refills | Status: DC

## 2016-06-03 NOTE — Telephone Encounter (Signed)
Pt informed that rx is ready for p/u.Marland KitchenAudelia Hives [place]

## 2016-07-14 ENCOUNTER — Other Ambulatory Visit: Payer: Self-pay | Admitting: *Deleted

## 2016-07-14 MED ORDER — HYDROCODONE-ACETAMINOPHEN 5-325 MG PO TABS: ORAL_TABLET | ORAL | 0 refills | Status: DC

## 2016-07-24 ENCOUNTER — Other Ambulatory Visit: Payer: Self-pay | Admitting: Oncology

## 2016-07-24 DIAGNOSIS — C771 Secondary and unspecified malignant neoplasm of intrathoracic lymph nodes: Secondary | ICD-10-CM

## 2016-07-25 ENCOUNTER — Telehealth: Payer: Self-pay | Admitting: Neurology

## 2016-07-25 ENCOUNTER — Other Ambulatory Visit: Payer: Self-pay | Admitting: Family Medicine

## 2016-07-25 NOTE — Telephone Encounter (Signed)
Patient is calling. She has been taking carbamazepine (TEGRETOL XR) 200 MG 12 hr tablet but Veterinary surgeon in [place] told her Lamotrigine would have less side effects than carbamazepine (TEGRETOL XR) 200 MG 12 hr tablet. Please call and discuss.

## 2016-07-25 NOTE — Telephone Encounter (Signed)
Returned call to patient - she was agreeable to schedule an appointment to further discuss medication changes.

## 2016-07-26 ENCOUNTER — Ambulatory Visit: Payer: Self-pay | Admitting: Adult Health

## 2016-08-01 ENCOUNTER — Other Ambulatory Visit (HOSPITAL_BASED_OUTPATIENT_CLINIC_OR_DEPARTMENT_OTHER): Payer: Medicare Other

## 2016-08-01 ENCOUNTER — Ambulatory Visit (HOSPITAL_BASED_OUTPATIENT_CLINIC_OR_DEPARTMENT_OTHER): Payer: Medicare Other | Admitting: Oncology

## 2016-08-01 VITALS — BP 138/98 | HR 88 | Temp 98.2°F | Resp 18 | Ht 67.0 in | Wt 129.6 lb

## 2016-08-01 DIAGNOSIS — C771 Secondary and unspecified malignant neoplasm of intrathoracic lymph nodes: Secondary | ICD-10-CM

## 2016-08-01 DIAGNOSIS — M818 Other osteoporosis without current pathological fracture: Secondary | ICD-10-CM

## 2016-08-01 DIAGNOSIS — M810 Age-related osteoporosis without current pathological fracture: Secondary | ICD-10-CM

## 2016-08-01 DIAGNOSIS — C50912 Malignant neoplasm of unspecified site of left female breast: Secondary | ICD-10-CM

## 2016-08-01 DIAGNOSIS — Z79899 Other long term (current) drug therapy: Secondary | ICD-10-CM

## 2016-08-01 DIAGNOSIS — Z1239 Encounter for other screening for malignant neoplasm of breast: Secondary | ICD-10-CM

## 2016-08-01 DIAGNOSIS — T386X5A Adverse effect of antigonadotrophins, antiestrogens, antiandrogens, not elsewhere classified, initial encounter: Secondary | ICD-10-CM

## 2016-08-01 LAB — CBC WITH DIFFERENTIAL/PLATELET
BASO%: 0.5 % (ref 0.0–2.0)
BASOS ABS: 0 10*3/uL (ref 0.0–0.1)
EOS ABS: 0.1 10*3/uL (ref 0.0–0.5)
EOS%: 1.4 % (ref 0.0–7.0)
HEMATOCRIT: 38.1 % (ref 34.8–46.6)
HEMOGLOBIN: 12.4 g/dL (ref 11.6–15.9)
LYMPH#: 1.5 10*3/uL (ref 0.9–3.3)
LYMPH%: 39.8 % (ref 14.0–49.7)
MCH: 30.6 pg (ref 25.1–34.0)
MCHC: 32.5 g/dL (ref 31.5–36.0)
MCV: 94.2 fL (ref 79.5–101.0)
MONO#: 0.2 10*3/uL (ref 0.1–0.9)
MONO%: 5.9 % (ref 0.0–14.0)
NEUT#: 2 10*3/uL (ref 1.5–6.5)
NEUT%: 52.4 % (ref 38.4–76.8)
Platelets: 176 10*3/uL (ref 145–400)
RBC: 4.05 10*6/uL (ref 3.70–5.45)
RDW: 12.7 % (ref 11.2–14.5)
WBC: 3.7 10*3/uL — ABNORMAL LOW (ref 3.9–10.3)

## 2016-08-01 LAB — COMPREHENSIVE METABOLIC PANEL
ALBUMIN: 3.8 g/dL (ref 3.5–5.0)
ALK PHOS: 46 U/L (ref 40–150)
ALT: 13 U/L (ref 0–55)
AST: 23 U/L (ref 5–34)
Anion Gap: 11 mEq/L (ref 3–11)
BILIRUBIN TOTAL: 0.26 mg/dL (ref 0.20–1.20)
BUN: 24.1 mg/dL (ref 7.0–26.0)
CALCIUM: 10.3 mg/dL (ref 8.4–10.4)
CO2: 28 mEq/L (ref 22–29)
Chloride: 102 mEq/L (ref 98–109)
Creatinine: 1 mg/dL (ref 0.6–1.1)
EGFR: 61 mL/min/{1.73_m2} — AB (ref >90)
Glucose: 95 mg/dl (ref 70–140)
POTASSIUM: 3.6 meq/L (ref 3.5–5.1)
Sodium: 141 mEq/L (ref 136–145)
TOTAL PROTEIN: 6.6 g/dL (ref 6.4–8.3)
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
## 2016-08-03 ENCOUNTER — Encounter: Payer: Self-pay | Admitting: Oncology

## 2016-08-04 ENCOUNTER — Ambulatory Visit (INDEPENDENT_AMBULATORY_CARE_PROVIDER_SITE_OTHER): Payer: Medicare Other | Admitting: Adult Health

## 2016-08-04 ENCOUNTER — Encounter: Payer: Self-pay | Admitting: Adult Health

## 2016-08-04 VITALS — BP 136/77 | HR 76 | Resp 14 | Ht 67.0 in | Wt 129.5 lb

## 2016-08-04 DIAGNOSIS — G500 Trigeminal neuralgia: Secondary | ICD-10-CM

## 2016-08-04 MED ORDER — CARBAMAZEPINE ER 100 MG PO TB12: 100.0000 mg | ORAL_TABLET | Freq: Two times a day (BID) | ORAL | 11 refills | Status: DC

## 2016-08-04 NOTE — Progress Notes (Signed)
Pt is ready to start prolia. Will get auth from insurance. Pt will go to lab for blood work.

## 2016-08-04 NOTE — Patient Instructions (Signed)
Decrease Carbamazepine XR 100 mg twice a day If your symptoms worsen or you develop new symptoms please let us know.

## 2016-08-04 NOTE — Progress Notes (Signed)
PATIENT: [name] DOB: 1946-01-22  REASON FOR VISIT: follow up- trigeminal neuralgia HISTORY FROM: patient  HISTORY OF PRESENT ILLNESS: Today 08/04/2016: [name] is a 70 year old female with a history of trigeminal neuralgia. She returns today for follow-up. She is currently on Tegretol extended release 200 mg twice a day. She reports that this has worked well for her. She states that she has not had any breakthrough pain in years. She states that she has noticed some forgetfulness here recently however she has been under a lot of stress. She states that even when she is forgetful she usually is able to recall what she is thinking about. She is able to do all ADLs independently. She operates a Teacher, music without difficulty. She returns today for an evaluation.  HISTORY  03/17/2016 : [name] is a 70 year old female with a history of trigeminal neuralgia. She returns today for follow-up. She continues on Tegretol extended release 200 mg twice a day. She reports that she has not had any discomfort from her trigeminal neuralgia. She tolerates the medication well. No change in her gait or balance. Denies any new neurological symptoms. He returns today for evaluation.  HISTORY 03/18/15 (MM):  70 year old female with a history of trigeminal neuralgia. She returns today for follow-up. The patient continues to take Tegretol-XR 200 mg twice a day. She states that this continues to control her pain. Denies any facial pain. Denies trouble with her gait or balance. Patient is currently in physical therapy for a hip injury. She states that this is going well. Denies any new neurological symptoms. She returns today for an evaluation.  HISTORY 07/18/14: [name] is a 70 year old female with a history of trigeminal neuralgia. She returns today for follow-up. She is currently taking Tegretol XR 200 mg BID. She reports that this has been beneficial. Denies any facial pain. No new neurological  complaints. No new medical history.  HISTORY 01/02/14 [facility]): [name] is a 70 year old right-handed Caucasian female, alone at today's clinical visit.Last seen in January 2014 by Hoyle Sauer. 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 She presenting with recurrent episode of left facial discomfort, the initial episode was in 2002, following a left upper molar filling procedure, she described left facial fullness, achy sensation, constant annoying pain, she was evaluated by [facility] neurologist Dr.Hayworth at that time, reported normal MRI of the brain, left facial pain last about 6-7 months, resolved by taking tegretol. The pain was at the left cheek area. She even underwent left upper molar extraction then. Since December 2010, she was treated with multiple rounds of antibiotic and prednisone for presumed sinus infection, because of left facial fullness sensation again, annoying pain in the left cheek V2 distribtion, very similar to what she experienced in 2002, no shooting pain, no hypersensitivity, no rash. No left facial weakness, or hearing changes. She recently had CT of the sinus that was normal, there is no evidence of sinusitis. MRI brain was essentially normal, there is only mild nonspecific small vessel disease. She was restarted on tegretol since 2011, which has been very helpful, she is taking carbamazepine XR 100mg , 2 tablet twice a day tabs bid, She is also on polypharmacy treatment, including Fosamax, calcium, vitamin D, Premarin, hydrocodone, Ativan, Pravachol, tramadol, Maxzide. She is also taking, Letrozole  REVIEW OF SYSTEMS: Out of  a complete 14 system review of symptoms, the patient complains only of the following symptoms, and all other reviewed systems are negative.  Agitation, behavior problem, confusion,  decreased concentration, nervous/anxious, hallucinations, restless leg, daytime sleepiness, sleep talking, acting out dreams, memory loss, weakness, cold intolerance, appetite change, hearing loss  ALLERGIES: Allergies  Allergen Reactions  . Simvastatin Other (See Comments)    Aches and memory loss  . Doxycycline Itching  . Other Rash  . Ampicillin Rash  . Cefadroxil Rash  . Dicloxacillin Rash    HOME MEDICATIONS: Outpatient Medications Prior to Visit  Medication Sig Dispense Refill  . Calcium-Magnesium-Vitamin D (CALCIUM 1200+D3 PO) Take by mouth daily.     Marland Kitchen denosumab (PROLIA) 60 MG/ML SOLN injection Inject 60 mg into the skin every 6 (six) months. Administer in upper arm, thigh, or abdomen    . EPIPEN 2-PAK 0.3 MG/0.3ML SOAJ injection Reported on 02/01/2016  0  . fish oil-omega-3 fatty acids 1000 MG capsule Take 2 g by mouth daily.      . fluticasone (FLONASE) 50 MCG/ACT nasal spray Place 1 spray into both nostrils daily.  1  . HYDROcodone-acetaminophen (NORCO/VICODIN) 5-325 MG tablet TAKE ONE TABLET BY MOUTH EVERY 12 HOURS AS NEEDED FOR PAIN 60 tablet 0  . levocetirizine (XYZAL) 5 MG tablet Take 5 mg by mouth daily.      Marland Kitchen levothyroxine (SYNTHROID, LEVOTHROID) 50 MCG tablet TAKE ONE TABLET BY MOUTH ONCE DAILY 90 tablet 0  . omeprazole (PRILOSEC) 40 MG capsule TAKE ONE CAPSULE DAILY 30 capsule 6  . pravastatin (PRAVACHOL) 40 MG tablet Take 1 tablet (40 mg total) by mouth daily. 30 tablet 11  . senna (SENOKOT) 8.6 MG tablet Take 2 tablets by mouth at bedtime.     . senna-docusate (SENOKOT-S) 8.6-50 MG tablet Take 2 tablets by mouth at bedtime.    . sucralfate (CARAFATE) 1 G tablet TAKE 1 TABLET FOUR TIMES DAILY 120 tablet 0  . tamoxifen (NOLVADEX) 20 MG tablet TAKE 1 TABLET EVERY DAY 30 tablet 3  . traZODone (DESYREL) 50 MG tablet Take 50 mg by mouth at bedtime as needed. Take 1/2 - 2 tablets at bedtime for sleep.     Marland Kitchen triamterene-hydrochlorothiazide (MAXZIDE-25) 37.5-25 MG tablet  Take 1 tablet by mouth daily. 30 tablet 0  . trimethoprim (TRIMPEX) 100 MG tablet Take 100 mg by mouth at bedtime.  3  . carbamazepine (TEGRETOL XR) 200 MG 12 hr tablet Take 1 tablet (200 mg total) by mouth 2 (two) times daily. 180 tablet 1   No facility-administered medications prior to visit.     PAST MEDICAL HISTORY: Past Medical History:  Diagnosis Date  . Breast cancer ([place]) 1999   T1N0 LEFT BREAST  . 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 Degenerative disc disease   . History of bladder infections    RECURRENT  . Hyperlipidemia   . Osteoporosis 03-04-2011    LAST BONE DENSITY 03-04-2011  . Recurrent sinusitis   . Thyroid disease   . Trigeminal neuralgia     PAST SURGICAL HISTORY: Past Surgical History:  Procedure Laterality Date  . ABDOMINAL HYSTERECTOMY  2005   Complete  . BLADDER SUSPENSION  2005  . BREAST SURGERY  1999   Lumpectomy L Br  . BREAST SURGERY  2007   Lymph Node Removal L Chest Wall  . cancerous LN removed  2008  . left breast lumpectomy  1999    FAMILY HISTORY:  Family History  Problem Relation Age of Onset  . Cancer Mother     breast  . Stroke Mother   . Hyperlipidemia Mother   . Cancer Father     bladder  . Colon cancer Father   . Cancer Son     Breast  . Cancer Maternal Aunt     Breast  . Stomach cancer Paternal Uncle   . Cancer Paternal Grandmother     Mouth  . Stomach cancer Paternal Uncle     SOCIAL HISTORY: Social History   Social History  . Marital status: Married    Spouse name: [name]   . Number of children: 3  . Years of education: RN   Occupational History  .      Retired- Therapist, sports   Social History Main Topics  . Smoking status: Never Smoker  . Smokeless tobacco: Never Used  . Alcohol use No  . Drug use: No  . Sexual activity: Not on file     Comment: housewife, married, 3 kids.   Other Topics Concern  . Not on file   Social History Narrative   Patient lives at  home with her husband [name])   Retired - RN   Right handed.   Caffeine two cups coffee daily and one sweet tea.      PHYSICAL EXAM  Vitals:   08/04/16 1340  BP: 136/77  Pulse: 76  Resp: 14  Weight: 129 lb 8 oz (58.7 kg)  Height: 5\' 7"  (1.702 m)   Body mass index is 20.28 kg/m.  Generalized: Well developed, in no acute distress   Neurological examination  Mentation: Alert oriented to time, place, history taking. Follows all commands speech and language fluent Cranial nerve II-XII: Pupils were equal round reactive to light. Extraocular movements were full, visual field were full on confrontational test. Facial sensation and strength were normal. Uvula tongue midline. Head turning and shoulder shrug  were normal and symmetric. Motor: The motor testing reveals 5 over 5 strength of all 4 extremities. Good symmetric motor tone is noted throughout.  Sensory: Sensory testing is intact to soft touch on all 4 extremities. No evidence of extinction is noted.  Coordination: Cerebellar testing reveals good finger-nose-finger and heel-to-shin bilaterally.  Gait and station: Gait is normal. Tandem gait is normal. Romberg is negative. No drift is seen.  Reflexes: Deep tendon reflexes are symmetric and normal bilaterally.   DIAGNOSTIC DATA (LABS, IMAGING, TESTING) - I reviewed patient records, labs, notes, testing and imaging myself where available.  Lab Results  Component Value Date   WBC 3.7 (L) 08/01/2016   HGB 12.4 08/01/2016   HCT 38.1 08/01/2016   MCV 94.2 08/01/2016   PLT 176 08/01/2016      Component Value Date/Time   NA 141 08/01/2016 1027   K 3.6 08/01/2016 1027   CL 99 03/17/2016 1056   CL 98 10/02/2012 1125   CO2 28 08/01/2016 1027   GLUCOSE 95 08/01/2016 1027   GLUCOSE 80 10/02/2012 1125   BUN 24.1 08/01/2016 1027   CREATININE 1.0 08/01/2016 1027   CALCIUM 10.3 08/01/2016 1027   PROT 6.6 08/01/2016 1027   ALBUMIN 3.8 08/01/2016 1027   AST 23 08/01/2016 1027    ALT 13 08/01/2016 1027   ALKPHOS 46 08/01/2016 1027   BILITOT 0.26 08/01/2016 1027   GFRNONAA 67 03/17/2016 1056   GFRNONAA >89 11/13/2013 1031   GFRAA 78 03/17/2016 1056   GFRAA >89 11/13/2013 1031   Lab Results  Component  Value Date   CHOL 229 (H) 11/23/2015   HDL 91 11/23/2015   LDLCALC 97 11/23/2015   TRIG 206 (H) 11/23/2015   CHOLHDL 2.5 11/23/2015   No results found for: HGBA1C Lab Results  Component Value Date   VITAMINB12 579 09/17/2014   Lab Results  Component Value Date   TSH 4.17 11/23/2015      ASSESSMENT AND PLAN 70 y.o. year old female  has a past medical history of Breast cancer ([place]) (1999); Cancer ([place]) (03-2006); Degenerative disc disease; History of bladder infections; Hyperlipidemia; Osteoporosis (03-04-2011); Recurrent sinusitis; Thyroid disease; and Trigeminal neuralgia. here with:  1. Trigeminal neuralgia  Overall the patient is doing well. She would like to try to reduce her medication. We will decrease Tegretol-XR 100 mg twice a day. If she has breakthrough pain she can resume taking the 200 mg tablet twice a day. She recently had blood work checked with her primary care that was unremarkable. She will return in July for follow-up visit with Dr. Krista Blue.     Ward Givens, MSN, NP-C 08/04/2016, 2:04 PM [facility] [address] [place], [place] [address] [phone]

## 2016-08-05 NOTE — Progress Notes (Signed)
I have reviewed and agreed above plan. 

## 2016-08-08 NOTE — Progress Notes (Signed)
[facility], spoke with [facility]. No auth required for Prolia.   Pt advised and appt scheduled.

## 2016-08-09 ENCOUNTER — Ambulatory Visit (INDEPENDENT_AMBULATORY_CARE_PROVIDER_SITE_OTHER): Payer: Medicare Other | Admitting: Family Medicine

## 2016-08-09 VITALS — BP 120/66 | HR 97 | Wt 132.0 lb

## 2016-08-09 DIAGNOSIS — M810 Age-related osteoporosis without current pathological fracture: Secondary | ICD-10-CM

## 2016-08-09 MED ORDER — DENOSUMAB 60 MG/ML ~~LOC~~ SOLN
60.0000 mg | Freq: Once | SUBCUTANEOUS | Status: AC
  Administered 2016-08-09: 60 mg via SUBCUTANEOUS

## 2016-08-09 NOTE — Progress Notes (Signed)
Agree with above.  Nachmen Mansel, MD  

## 2016-08-09 NOTE — Progress Notes (Signed)
Pt is here to restart prolia injection. Pt tolerated injection well without complications. Pt advised to make next appointment in 6 months.

## 2016-08-10 ENCOUNTER — Other Ambulatory Visit: Payer: Self-pay | Admitting: Family Medicine

## 2016-08-11 ENCOUNTER — Other Ambulatory Visit: Payer: Self-pay | Admitting: Family Medicine

## 2016-08-16 ENCOUNTER — Telehealth: Payer: Self-pay | Admitting: Family Medicine

## 2016-08-16 NOTE — Telephone Encounter (Signed)
Received fax from [facility] and they approved coverage on Cyclobenzaprine HCL from 08/27/2016 - 08/28/2017. - CF

## 2016-08-18 ENCOUNTER — Other Ambulatory Visit: Payer: Self-pay

## 2016-08-18 MED ORDER — HYDROCODONE-ACETAMINOPHEN 5-325 MG PO TABS
ORAL_TABLET | ORAL | 0 refills | Status: DC
Start: 2016-08-18 — End: 2016-09-21

## 2016-08-25 ENCOUNTER — Other Ambulatory Visit: Payer: Self-pay | Admitting: Family Medicine

## 2016-09-10 ENCOUNTER — Encounter: Payer: Self-pay | Admitting: Emergency Medicine

## 2016-09-10 ENCOUNTER — Emergency Department
Admission: EM | Admit: 2016-09-10 | Discharge: 2016-09-10 | Disposition: A | Discharge status: Home / Self Care | Payer: Medicare HMO | Source: Home / Self Care | Attending: Family Medicine | Admitting: Family Medicine

## 2016-09-10 DIAGNOSIS — J069 Acute upper respiratory infection, unspecified: Secondary | ICD-10-CM

## 2016-09-10 DIAGNOSIS — J029 Acute pharyngitis, unspecified: Secondary | ICD-10-CM

## 2016-09-10 DIAGNOSIS — B9789 Other viral agents as the cause of diseases classified elsewhere: Secondary | ICD-10-CM | POA: Diagnosis not present

## 2016-09-10 MED ORDER — AZITHROMYCIN 250 MG PO TABS: 250.0000 mg | ORAL_TABLET | Freq: Every day | ORAL | 0 refills | Status: DC

## 2016-09-10 MED ORDER — BENZONATATE 100 MG PO CAPS: 100.0000 mg | ORAL_CAPSULE | Freq: Three times a day (TID) | ORAL | 0 refills | Status: DC

## 2016-09-10 NOTE — ED Provider Notes (Signed)
CSN: [id number]     Arrival date & time 09/10/16  1702 History   First MD Initiated Contact with Patient 09/10/16 1738     Chief Complaint  Patient presents with  . Sore Throat   (Consider location/radiation/quality/duration/timing/severity/associated sxs/prior Treatment) HPI  [name] is a 71 y.o. female presenting to UC with c/o 5 days of sore throat, 4 days of mildly productive cough with sinus drainage. Denies fever or chills. She has had Right ear pain. She notes her husband was recently dx with a virus but was started on Azithromycin.  Denies n/v/d. Denies chest pain or SOB.   Past Medical History:  Diagnosis Date  . Breast cancer ([place]) 1999   T1N0 LEFT BREAST  . 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 Degenerative disc disease   . History of bladder infections    RECURRENT  . Hyperlipidemia   . Osteoporosis 03-04-2011    LAST BONE DENSITY 03-04-2011  . Recurrent sinusitis   . Thyroid disease   . Trigeminal neuralgia    Past Surgical History:  Procedure Laterality Date  . ABDOMINAL HYSTERECTOMY  2005   Complete  . BLADDER SUSPENSION  2005  . BREAST SURGERY  1999   Lumpectomy L Br  . BREAST SURGERY  2007   Lymph Node Removal L Chest Wall  . cancerous LN removed  2008  . left breast lumpectomy  1999   Family History  Problem Relation Age of Onset  . Cancer Mother     breast  . Stroke Mother   . Hyperlipidemia Mother   . Cancer Father     bladder  . Colon cancer Father   . Cancer Paternal Grandmother     Mouth  . Cancer Son     Breast  . Cancer Maternal Aunt     Breast  . Stomach cancer Paternal Uncle   . Stomach cancer Paternal Uncle    Social History  Substance Use Topics  . Smoking status: Never Smoker  . Smokeless tobacco: Never Used  . Alcohol use No   OB History    No data available     Review of Systems  Constitutional: Negative for chills and fever.  HENT: Positive for congestion, ear  pain ( Right), rhinorrhea, sinus pressure and sore throat. Negative for trouble swallowing and voice change.   Respiratory: Positive for cough. Negative for shortness of breath.   Cardiovascular: Negative for chest pain and palpitations.  Gastrointestinal: Negative for abdominal pain, diarrhea, nausea and vomiting.  Musculoskeletal: Negative for arthralgias, back pain and myalgias.  Skin: Negative for rash.    Allergies  Simvastatin; Doxycycline; Other; Ampicillin; Cefadroxil; and Dicloxacillin  Home Medications   Prior to Admission medications   Medication Sig Start Date End Date Taking? Authorizing Provider  azithromycin (ZITHROMAX) 250 MG tablet Take 1 tablet (250 mg total) by mouth daily. Take first 2 tablets together, then 1 every day until finished. 09/10/16   Noland Fordyce, PA-C  benzonatate (TESSALON) 100 MG capsule Take 1-2 capsules (100-200 mg total) by mouth every 8 (eight) hours. 09/10/16   Noland Fordyce, PA-C  Calcium-Magnesium-Vitamin D (CALCIUM 1200+D3 PO) Take by mouth daily.     Historical Provider, MD  carbamazepine (TEGRETOL XR) 100 MG 12 hr tablet Take 1 tablet (100 mg total) by mouth 2 (two) times daily. 08/04/16   Ward Givens, NP  denosumab (PROLIA) 60 MG/ML SOLN injection Inject 60 mg into the skin every 6 (  six) months. Administer in upper arm, thigh, or abdomen    Historical Provider, MD  EPIPEN 2-PAK 0.3 MG/0.3ML SOAJ injection Reported on 02/01/2016 05/27/15   Historical Provider, MD  fish oil-omega-3 fatty acids 1000 MG capsule Take 2 g by mouth daily.      Historical Provider, MD  fluticasone (FLONASE) 50 MCG/ACT nasal spray Place 1 spray into both nostrils daily. 08/01/15   Historical Provider, MD  HYDROcodone-acetaminophen (NORCO/VICODIN) 5-325 MG tablet TAKE ONE TABLET BY MOUTH EVERY 12 HOURS AS NEEDED FOR PAIN 08/18/16   Hali Marry, MD  levocetirizine (XYZAL) 5 MG tablet Take 5 mg by mouth daily.      Historical Provider, MD  levothyroxine (SYNTHROID,  LEVOTHROID) 50 MCG tablet TAKE ONE TABLET BY MOUTH ONCE DAILY 08/11/16   Hali Marry, MD  omeprazole (PRILOSEC) 40 MG capsule TAKE ONE CAPSULE DAILY 02/11/16   Hali Marry, MD  pravastatin (PRAVACHOL) 40 MG tablet Take 1 tablet (40 mg total) by mouth daily. 11/23/15   Hali Marry, MD  senna (SENOKOT) 8.6 MG tablet Take 2 tablets by mouth at bedtime.     Historical Provider, MD  senna-docusate (SENOKOT-S) 8.6-50 MG tablet Take 2 tablets by mouth at bedtime.    Historical Provider, MD  sucralfate (CARAFATE) 1 G tablet TAKE 1 TABLET FOUR TIMES DAILY 08/07/15   Donella Stade, PA-C  tamoxifen (NOLVADEX) 20 MG tablet TAKE 1 TABLET EVERY DAY 05/03/16   Lennis Marion Downer, MD  traZODone (DESYREL) 50 MG tablet TAKE ONE-HALF - TWO TABLETS AT BEDTIME AS NEEDED SLEEP 08/11/16   Hali Marry, MD  triamterene-hydrochlorothiazide (MAXZIDE-25) 37.5-25 MG tablet Take 1 tablet by mouth daily. 08/25/16   Hali Marry, MD   Meds Ordered and Administered this Visit  Medications - No data to display  BP 128/82 (BP Location: Right Arm)   Pulse 91   Temp 98.4 F (36.9 C) (Oral)   Ht 5\' 7"  (1.702 m)   Wt 132 lb (59.9 kg)   SpO2 97%   BMI 20.67 kg/m  No data found.   Physical Exam  Constitutional: She is oriented to person, place, and time. She appears well-developed and well-nourished. No distress.  HENT:  Head: Normocephalic and atraumatic.  Right Ear: Tympanic membrane normal.  Left Ear: Tympanic membrane normal.  Nose: Mucosal edema present. Right sinus exhibits no maxillary sinus tenderness and no frontal sinus tenderness. Left sinus exhibits no maxillary sinus tenderness and no frontal sinus tenderness.  Mouth/Throat: Uvula is midline and mucous membranes are normal. Posterior oropharyngeal erythema present. No oropharyngeal exudate, posterior oropharyngeal edema or tonsillar abscesses.  Eyes: EOM are normal.  Neck: Normal range of motion. Neck supple.   Cardiovascular: Normal rate and regular rhythm.   Pulmonary/Chest: Effort normal and breath sounds normal. No stridor. No respiratory distress. She has no wheezes. She has no rales.  Musculoskeletal: Normal range of motion.  Lymphadenopathy:    She has no cervical adenopathy.  Neurological: She is alert and oriented to person, place, and time.  Skin: Skin is warm and dry. She is not diaphoretic.  Psychiatric: She has a normal mood and affect. Her behavior is normal.  Nursing note and vitals reviewed.   Urgent Care Course   Clinical Course     Procedures (including critical care time)  Labs Review Labs Reviewed - No data to display  Imaging Review No results found.    MDM   1. Viral URI with cough   2. Sore  throat    Pt c/o 4-5 days of URI symptoms. Husband has also been sick. No evidence of bacterial infection at this time. Symptoms likely viral.  Encouraged symptomatic treatment for a few more days.  Rx: Tessalon Prescription to hold with expiration date for azithromycin. Pt to fill if persistent fever develops or not improving in 1 week.     Noland Fordyce, PA-C 09/11/16 1110

## 2016-09-10 NOTE — Discharge Instructions (Signed)
° °  Your symptoms are likely due to a virus such as the common cold, however, if you developing worsening chest congestion with shortness of breath, persistent fever for 3 days, or symptoms not improving in 4-5 days, you may fill the antibiotic (azithromycin).  If you do fill the antibiotic,  please take antibiotics as prescribed and be sure to complete entire course even if you start to feel better to ensure infection does not come back. ° °

## 2016-09-10 NOTE — ED Triage Notes (Signed)
Pt c/o sore throat x 5 days, cough x 4 days, sinus drainage, no fever, right ear pain

## 2016-09-12 ENCOUNTER — Other Ambulatory Visit: Payer: Self-pay | Admitting: Family Medicine

## 2016-09-12 DIAGNOSIS — E782 Mixed hyperlipidemia: Secondary | ICD-10-CM

## 2016-09-21 ENCOUNTER — Other Ambulatory Visit: Payer: Self-pay | Admitting: *Deleted

## 2016-09-21 MED ORDER — HYDROCODONE-ACETAMINOPHEN 5-325 MG PO TABS: ORAL_TABLET | ORAL | 0 refills | Status: DC

## 2016-09-22 DIAGNOSIS — J3089 Other allergic rhinitis: Secondary | ICD-10-CM | POA: Diagnosis not present

## 2016-09-23 ENCOUNTER — Other Ambulatory Visit: Payer: Self-pay | Admitting: Family Medicine

## 2016-09-28 DIAGNOSIS — J3089 Other allergic rhinitis: Secondary | ICD-10-CM | POA: Diagnosis not present

## 2016-10-04 ENCOUNTER — Ambulatory Visit (INDEPENDENT_AMBULATORY_CARE_PROVIDER_SITE_OTHER): Payer: Medicare HMO | Admitting: Family Medicine

## 2016-10-04 ENCOUNTER — Encounter: Payer: Self-pay | Admitting: Family Medicine

## 2016-10-04 VITALS — BP 124/68 | HR 93 | Ht 67.0 in | Wt 133.0 lb

## 2016-10-04 DIAGNOSIS — J0110 Acute frontal sinusitis, unspecified: Secondary | ICD-10-CM | POA: Diagnosis not present

## 2016-10-04 MED ORDER — LEVOFLOXACIN 500 MG PO TABS: 500.0000 mg | ORAL_TABLET | Freq: Every day | ORAL | 0 refills | Status: DC

## 2016-10-04 NOTE — Progress Notes (Signed)
   Subjective:    Patient ID: [name], female    DOB: 04-03-1946, 71 y.o.   MRN: [id number]  HPI 2-3 weeks of frontal sinus pressure.  No post nasal drip.  About a month ago went to Urgent Care and given zpack. She did take the medication. She says she really didn't get any better. She denies any significant postnasal drip. No sore throat. She has had some mild right ear pain. No fevers chills or sweats. Not currently taking any medications for it. No worsening or alleviating factors.  Review of Systems     Objective:   Physical Exam  Constitutional: She is oriented to person, place, and time. She appears well-developed and well-nourished.  HENT:  Head: Normocephalic and atraumatic.  Right Ear: External ear normal.  Left Ear: External ear normal.  Nose: Nose normal.  Mouth/Throat: Oropharynx is clear and moist.  TMs and canals are clear.   Eyes: Conjunctivae and EOM are normal. Pupils are equal, round, and reactive to light.  Neck: Neck supple. No thyromegaly present.  Cardiovascular: Normal rate, regular rhythm and normal heart sounds.   Pulmonary/Chest: Effort normal and breath sounds normal. She has no wheezes.  Lymphadenopathy:    She has no cervical adenopathy.  Neurological: She is alert and oriented to person, place, and time.  Skin: Skin is warm and dry.  Psychiatric: She has a normal mood and affect.        Assessment & Plan:  Acute sinusitis-failed azithromycin. We'll switch to Levaquin. If she's not better in one week then recommend CT of the sinuses for further evaluation. Okay to use nasal saline and add a nasal steroid spray such as Flonase or Nasonex.

## 2016-10-04 NOTE — Patient Instructions (Signed)
Call if not better in one week.  

## 2016-10-06 ENCOUNTER — Other Ambulatory Visit: Payer: Self-pay | Admitting: Emergency Medicine

## 2016-10-06 ENCOUNTER — Telehealth: Payer: Self-pay

## 2016-10-06 DIAGNOSIS — J3089 Other allergic rhinitis: Secondary | ICD-10-CM | POA: Diagnosis not present

## 2016-10-06 DIAGNOSIS — C50919 Malignant neoplasm of unspecified site of unspecified female breast: Secondary | ICD-10-CM

## 2016-10-06 MED ORDER — TAMOXIFEN CITRATE 20 MG PO TABS
20.0000 mg | ORAL_TABLET | Freq: Every day | ORAL | 3 refills | Status: DC
Start: 2016-10-06 — End: 2017-01-31

## 2016-10-06 NOTE — Telephone Encounter (Signed)
Husband called asking for 90 day supply tamoxifen to their new mail order pharmacy. The mail order pharmacy [facility] placed on preferred pharm list. The current order is for monthly, will need Dr Burr Medico to approve 90 day supply.

## 2016-10-06 NOTE — Telephone Encounter (Addendum)
F/u is with Dr Lindi Adie 01/31/17 so will need approval from Dr Lindi Adie for 90 day supply.

## 2016-10-11 DIAGNOSIS — J3089 Other allergic rhinitis: Secondary | ICD-10-CM | POA: Diagnosis not present

## 2016-10-13 DIAGNOSIS — J329 Chronic sinusitis, unspecified: Secondary | ICD-10-CM | POA: Diagnosis not present

## 2016-10-13 DIAGNOSIS — J3089 Other allergic rhinitis: Secondary | ICD-10-CM | POA: Diagnosis not present

## 2016-10-14 ENCOUNTER — Other Ambulatory Visit: Payer: Self-pay | Admitting: *Deleted

## 2016-10-14 ENCOUNTER — Other Ambulatory Visit: Payer: Self-pay

## 2016-10-14 DIAGNOSIS — J3089 Other allergic rhinitis: Secondary | ICD-10-CM | POA: Diagnosis not present

## 2016-10-14 MED ORDER — TRAZODONE HCL 50 MG PO TABS: ORAL_TABLET | ORAL | 1 refills | Status: DC

## 2016-10-19 ENCOUNTER — Other Ambulatory Visit: Payer: Self-pay | Admitting: Oncology

## 2016-10-19 ENCOUNTER — Telehealth: Payer: Self-pay | Admitting: Family Medicine

## 2016-10-19 ENCOUNTER — Other Ambulatory Visit: Payer: Self-pay | Admitting: Family Medicine

## 2016-10-19 DIAGNOSIS — Z1231 Encounter for screening mammogram for malignant neoplasm of breast: Secondary | ICD-10-CM

## 2016-10-19 DIAGNOSIS — J321 Chronic frontal sinusitis: Secondary | ICD-10-CM

## 2016-10-19 NOTE — Telephone Encounter (Signed)
Patient called adv that she was seen feb 6th 2017 for sinus and she has taken the med given and it has not helped nor working. Patient request to get something else stronger called into [employer]. Thanks

## 2016-10-20 NOTE — Telephone Encounter (Signed)
That was already her second round of antibiotics if she is not better we did discuss getting a CT of the sinuses for further evaluation. She is okay with moving forward with that and please place order for CT Limited sinus.

## 2016-10-20 NOTE — Telephone Encounter (Signed)
Spoke w/pt's husband and he wanted to know about the abx. I informed him that the medication was not sent due to Dr. Madilyn Fireman had discussed this previously with her and that she would need to get another CT done. I advised him of this and he would like for the order to be placed.[name]

## 2016-10-21 ENCOUNTER — Ambulatory Visit (INDEPENDENT_AMBULATORY_CARE_PROVIDER_SITE_OTHER): Payer: Medicare HMO

## 2016-10-21 DIAGNOSIS — J321 Chronic frontal sinusitis: Secondary | ICD-10-CM

## 2016-10-21 DIAGNOSIS — J328 Other chronic sinusitis: Secondary | ICD-10-CM | POA: Diagnosis not present

## 2016-10-21 DIAGNOSIS — J342 Deviated nasal septum: Secondary | ICD-10-CM | POA: Diagnosis not present

## 2016-10-24 DIAGNOSIS — J3089 Other allergic rhinitis: Secondary | ICD-10-CM | POA: Diagnosis not present

## 2016-10-31 DIAGNOSIS — J3089 Other allergic rhinitis: Secondary | ICD-10-CM | POA: Diagnosis not present

## 2016-11-01 ENCOUNTER — Other Ambulatory Visit: Payer: Self-pay

## 2016-11-01 ENCOUNTER — Other Ambulatory Visit: Payer: Self-pay | Admitting: *Deleted

## 2016-11-01 ENCOUNTER — Telehealth: Payer: Self-pay | Admitting: Adult Health

## 2016-11-01 DIAGNOSIS — E782 Mixed hyperlipidemia: Secondary | ICD-10-CM

## 2016-11-01 MED ORDER — PRAVASTATIN SODIUM 40 MG PO TABS: 40.0000 mg | ORAL_TABLET | Freq: Every day | ORAL | 3 refills | Status: DC

## 2016-11-01 MED ORDER — HYDROCODONE-ACETAMINOPHEN 5-325 MG PO TABS: ORAL_TABLET | ORAL | 0 refills | Status: DC

## 2016-11-01 MED ORDER — OMEPRAZOLE 40 MG PO CPDR: 40.0000 mg | DELAYED_RELEASE_CAPSULE | Freq: Every day | ORAL | 3 refills | Status: DC

## 2016-11-01 MED ORDER — TRIAMTERENE-HCTZ 37.5-25 MG PO TABS: 1.0000 | ORAL_TABLET | Freq: Every day | ORAL | 3 refills | Status: DC

## 2016-11-01 MED ORDER — LEVOTHYROXINE SODIUM 50 MCG PO TABS: 50.0000 ug | ORAL_TABLET | Freq: Every day | ORAL | 1 refills | Status: DC

## 2016-11-01 NOTE — Telephone Encounter (Signed)
Patient called office in reference to carbamazepine (TEGRETOL XR) 100 MG 12 hr tablet.  Patient states she has recently changed insurance to [place] and if wanting to see if she can be switched to Lamotrigine.  Please call

## 2016-11-03 DIAGNOSIS — R69 Illness, unspecified: Secondary | ICD-10-CM | POA: Diagnosis not present

## 2016-11-04 MED ORDER — CARBAMAZEPINE ER 100 MG PO TB12: 100.0000 mg | ORAL_TABLET | Freq: Two times a day (BID) | ORAL | 3 refills | Status: DC

## 2016-11-04 NOTE — Telephone Encounter (Signed)
Patient states that she was getting her medications mixed up. Does not want Lamotrigine. Asked for Carbamazepine to be sent into [employer] due to new insurance.

## 2016-11-04 NOTE — Addendum Note (Signed)
Addended by: Laurence Spates on: 11/04/2016 11:59 AM   Modules accepted: Orders

## 2016-11-10 DIAGNOSIS — J3089 Other allergic rhinitis: Secondary | ICD-10-CM | POA: Diagnosis not present

## 2016-11-15 DIAGNOSIS — H2513 Age-related nuclear cataract, bilateral: Secondary | ICD-10-CM | POA: Diagnosis not present

## 2016-11-15 DIAGNOSIS — H401131 Primary open-angle glaucoma, bilateral, mild stage: Secondary | ICD-10-CM | POA: Diagnosis not present

## 2016-11-15 DIAGNOSIS — H524 Presbyopia: Secondary | ICD-10-CM | POA: Diagnosis not present

## 2016-11-15 DIAGNOSIS — H52223 Regular astigmatism, bilateral: Secondary | ICD-10-CM | POA: Diagnosis not present

## 2016-11-15 DIAGNOSIS — H5213 Myopia, bilateral: Secondary | ICD-10-CM | POA: Diagnosis not present

## 2016-11-17 DIAGNOSIS — J3089 Other allergic rhinitis: Secondary | ICD-10-CM | POA: Diagnosis not present

## 2016-11-22 ENCOUNTER — Ambulatory Visit (INDEPENDENT_AMBULATORY_CARE_PROVIDER_SITE_OTHER): Payer: Medicare HMO | Admitting: Family Medicine

## 2016-11-22 ENCOUNTER — Ambulatory Visit: Payer: Medicare HMO

## 2016-11-22 ENCOUNTER — Encounter: Payer: Self-pay | Admitting: Family Medicine

## 2016-11-22 ENCOUNTER — Telehealth: Payer: Self-pay

## 2016-11-22 VITALS — BP 132/78 | HR 100 | Ht 67.0 in | Wt 127.0 lb

## 2016-11-22 DIAGNOSIS — M5136 Other intervertebral disc degeneration, lumbar region: Secondary | ICD-10-CM

## 2016-11-22 DIAGNOSIS — G8929 Other chronic pain: Secondary | ICD-10-CM | POA: Diagnosis not present

## 2016-11-22 DIAGNOSIS — G500 Trigeminal neuralgia: Secondary | ICD-10-CM

## 2016-11-22 DIAGNOSIS — J3089 Other allergic rhinitis: Secondary | ICD-10-CM | POA: Diagnosis not present

## 2016-11-22 DIAGNOSIS — M5030 Other cervical disc degeneration, unspecified cervical region: Secondary | ICD-10-CM

## 2016-11-22 MED ORDER — HYDROCODONE-ACETAMINOPHEN 5-325 MG PO TABS: ORAL_TABLET | ORAL | 0 refills | Status: DC

## 2016-11-22 NOTE — Addendum Note (Signed)
Addended by: Teddy Spike on: 11/22/2016 12:11 PM   Modules accepted: Orders

## 2016-11-22 NOTE — Progress Notes (Signed)
Subjective:    Patient ID: [name], female    DOB: Oct 14, 1945, 71 y.o.   MRN: [id number]  HPI 71 year old female comes in today to follow-up for chronic pain management. He currently takes hydrocodone 5/325 every 12 hours as needed. She currently has a diagnosis of chronic neck and low back pain with degenerative disc disease in both locations. She has not had any significant changes in pain or discomfort. She says she does try to stretch every night before bedtime and more recently has actually started walking some for exercise with her husband.  Her trigeminal neuralgia-she would like a new referral for a local neurologist. She would just like to find something closer at home so that she doesn't have to drive.  Recently saw her about a month ago for recurrent sinus infections. She Artie been on 2 rounds of antibiotics and wasn't really improving. She still complaining a lot of sinus pain and pressure. We decided go ahead and do a sinus CT at that point and it was essentially negative. Dr. Clabe Seal decided to decrease her Tegretol and see if that could be causing some of her symptoms.  Review of Systems BP 132/78   Pulse 100   Ht 5\' 7"  (1.702 m)   Wt 127 lb (57.6 kg)   SpO2 100%   BMI 19.89 kg/m     Allergies  Allergen Reactions  . Simvastatin Other (See Comments)    Aches and memory loss  . Doxycycline Itching  . Other Rash  . Ampicillin Rash  . Cefadroxil Rash  . Dicloxacillin Rash    Past Medical History:  Diagnosis Date  . Breast cancer ([place]) 1999   T1N0 LEFT BREAST  . Cancer ([place]) 03-2006   METASTATIC BREAST TO APPARENT ISOLATED LEFT INTERNAL MAMMARY NODE  . Degenerative disc disease   . History of bladder infections    RECURRENT  . Hyperlipidemia   . Osteoporosis 03-04-2011    LAST BONE DENSITY 03-04-2011  . Recurrent sinusitis   . Thyroid disease   . Trigeminal neuralgia     Past Surgical History:  Procedure Laterality Date  . ABDOMINAL HYSTERECTOMY  2005    Complete  . BLADDER SUSPENSION  2005  . BREAST SURGERY  1999   Lumpectomy L Br  . BREAST SURGERY  2007   Lymph Node Removal L Chest Wall  . cancerous LN removed  2008  . left breast lumpectomy  1999    Social History   Social History  . Marital status: Married    Spouse name: [name]   . Number of children: 3  . Years of education: RN   Occupational History  .      Retired- Therapist, sports   Social History Main Topics  . Smoking status: Never Smoker  . Smokeless tobacco: Never Used  . Alcohol use No  . Drug use: No  . Sexual activity: Not on file     Comment: housewife, married, 3 kids.   Other Topics Concern  . Not on file   Social History Narrative   Patient lives at home with her husband [name])   Retired - RN   Right handed.   Caffeine two cups coffee daily and one sweet tea.    Family History  Problem Relation Age of Onset  . Cancer Mother     breast  . Stroke Mother   . Hyperlipidemia Mother   . Cancer Father     bladder  . Colon cancer Father   .  Cancer Paternal Grandmother     Mouth  . Cancer Son     Breast  . Cancer Maternal Aunt     Breast  . Stomach cancer Paternal Uncle   . Stomach cancer Paternal Uncle     Outpatient Encounter Prescriptions as of 11/22/2016  Medication Sig  . Calcium-Magnesium-Vitamin D (CALCIUM 1200+D3 PO) Take by mouth daily.   . carbamazepine (TEGRETOL XR) 100 MG 12 hr tablet Take 1 tablet (100 mg total) by mouth 2 (two) times daily.  Marland Kitchen denosumab (PROLIA) 60 MG/ML SOLN injection Inject 60 mg into the skin every 6 (six) months. Administer in upper arm, thigh, or abdomen  . EPIPEN 2-PAK 0.3 MG/0.3ML SOAJ injection Reported on 02/01/2016  . fish oil-omega-3 fatty acids 1000 MG capsule Take 2 g by mouth daily.    . fluticasone (FLONASE) 50 MCG/ACT nasal spray Place 1 spray into both nostrils daily.  Marland Kitchen HYDROcodone-acetaminophen (NORCO/VICODIN) 5-325 MG tablet TAKE ONE TABLET BY MOUTH EVERY 12 HOURS AS NEEDED FOR PAIN. Ok to fill on  12/02/16.  Marland Kitchen levocetirizine (XYZAL) 5 MG tablet Take 5 mg by mouth daily.    Marland Kitchen levothyroxine (SYNTHROID, LEVOTHROID) 50 MCG tablet Take 1 tablet (50 mcg total) by mouth daily.  Marland Kitchen omeprazole (PRILOSEC) 40 MG capsule Take 1 capsule (40 mg total) by mouth daily.  . pravastatin (PRAVACHOL) 40 MG tablet Take 1 tablet (40 mg total) by mouth daily.  Marland Kitchen senna (SENOKOT) 8.6 MG tablet Take 2 tablets by mouth at bedtime.   . senna-docusate (SENOKOT-S) 8.6-50 MG tablet Take 2 tablets by mouth at bedtime.  . sucralfate (CARAFATE) 1 G tablet TAKE 1 TABLET FOUR TIMES DAILY  . tamoxifen (NOLVADEX) 20 MG tablet Take 1 tablet (20 mg total) by mouth daily.  . traZODone (DESYREL) 50 MG tablet TAKE ONE-HALF - TWO TABLETS AT BEDTIME AS NEEDED SLEEP  . triamterene-hydrochlorothiazide (MAXZIDE-25) 37.5-25 MG tablet Take 1 tablet by mouth daily.  . [DISCONTINUED] HYDROcodone-acetaminophen (NORCO/VICODIN) 5-325 MG tablet TAKE ONE TABLET BY MOUTH EVERY 12 HOURS AS NEEDED FOR PAIN  . [DISCONTINUED] levofloxacin (LEVAQUIN) 500 MG tablet Take 1 tablet (500 mg total) by mouth daily.   No facility-administered encounter medications on file as of 11/22/2016.          Objective:   Physical Exam  Constitutional: She is oriented to person, place, and time. She appears well-developed and well-nourished.  HENT:  Head: Normocephalic and atraumatic.  Eyes: Conjunctivae and EOM are normal.  Cardiovascular: Normal rate.   Pulmonary/Chest: Effort normal.  Musculoskeletal:  Neck with normal flexion, extension, rotation right and left and side bending. Upper extremity reflexes are normal as well. Nontender over the cervical spine. She just a little mildly tender over the upper lumbar spine. Nontender over the paraspinous and SI joints. Negative straight leg raise bilaterally. Hip, knee, and will shrink this out of 5 bilaterally. Patellar reflexes are 2+ bilaterally.  Neurological: She is alert and oriented to person, place, and time.  She has normal reflexes. She exhibits normal muscle tone. Coordination normal.  Skin: Skin is dry. No pallor.  Psychiatric: She has a normal mood and affect. Her behavior is normal.  Vitals reviewed.       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 Her pain contract is up-to-date as it was done in July 2017. Will review her medications in the [place] controlled substance  registry. Pain contract updated since she has changed pharmacies.  Sinus pain - seem to have resolved on its own.   Trigeminal neuralgia-we'll place new referral to Dr. Sandrea Hammond with [facility] neurology here locally.

## 2016-11-22 NOTE — Telephone Encounter (Signed)
Pt stated that Dr. Heber Belleair Shore is who she has been using for allergy.

## 2016-11-22 NOTE — Telephone Encounter (Signed)
OK, please call for notes.

## 2016-11-23 LAB — DRUG ABUSE PANEL 10-50 NO CONF, U
AMPHETAMINES (1000 NG/ML SCRN): NEGATIVE
BARBITURATES: NEGATIVE
BENZODIAZEPINES: NEGATIVE
COCAINE METABOLITES: NEGATIVE
MARIJUANA MET (50 NG/ML SCRN): NEGATIVE
METHADONE: NEGATIVE
METHAQUALONE: NEGATIVE
OPIATES: POSITIVE — AB
PHENCYCLIDINE: NEGATIVE
PROPOXYPHENE: NEGATIVE

## 2016-11-24 NOTE — Telephone Encounter (Signed)
Notes are in your box

## 2016-11-28 ENCOUNTER — Ambulatory Visit
Admission: RE | Admit: 2016-11-28 | Discharge: 2016-11-28 | Disposition: A | Discharge status: Home / Self Care | Payer: Medicare HMO | Source: Ambulatory Visit | Attending: Oncology | Admitting: Oncology

## 2016-11-28 DIAGNOSIS — Z1231 Encounter for screening mammogram for malignant neoplasm of breast: Secondary | ICD-10-CM | POA: Diagnosis not present

## 2016-12-01 DIAGNOSIS — G500 Trigeminal neuralgia: Secondary | ICD-10-CM | POA: Diagnosis not present

## 2016-12-02 DIAGNOSIS — N3021 Other chronic cystitis with hematuria: Secondary | ICD-10-CM | POA: Diagnosis not present

## 2016-12-02 DIAGNOSIS — R350 Frequency of micturition: Secondary | ICD-10-CM | POA: Diagnosis not present

## 2016-12-06 DIAGNOSIS — J3089 Other allergic rhinitis: Secondary | ICD-10-CM | POA: Diagnosis not present

## 2016-12-15 DIAGNOSIS — J3089 Other allergic rhinitis: Secondary | ICD-10-CM | POA: Diagnosis not present

## 2016-12-21 DIAGNOSIS — J3089 Other allergic rhinitis: Secondary | ICD-10-CM | POA: Diagnosis not present

## 2016-12-27 DIAGNOSIS — J3089 Other allergic rhinitis: Secondary | ICD-10-CM | POA: Diagnosis not present

## 2017-01-03 ENCOUNTER — Encounter: Payer: Self-pay | Admitting: Family Medicine

## 2017-01-03 ENCOUNTER — Ambulatory Visit (INDEPENDENT_AMBULATORY_CARE_PROVIDER_SITE_OTHER): Payer: Medicare HMO | Admitting: Family Medicine

## 2017-01-03 VITALS — BP 133/67 | HR 86 | Temp 98.9°F | Wt 129.0 lb

## 2017-01-03 DIAGNOSIS — J0100 Acute maxillary sinusitis, unspecified: Secondary | ICD-10-CM

## 2017-01-03 DIAGNOSIS — J3089 Other allergic rhinitis: Secondary | ICD-10-CM | POA: Diagnosis not present

## 2017-01-03 MED ORDER — AZITHROMYCIN 250 MG PO TABS: ORAL_TABLET | ORAL | 0 refills | Status: AC

## 2017-01-03 NOTE — Patient Instructions (Addendum)

## 2017-01-03 NOTE — Progress Notes (Signed)
   Subjective:    Patient ID: [name], female    DOB: May 11, 1946, 71 y.o.   MRN: [id number]  HPI One week of nasal congestion, left maxillary sinus pain, excess fatigue.  No cough or ST. + HA.  She feels like it is another sinus infection. Sinus congestion with clear mucous. Has been taking her allergy medicines through the spring. No worsening or alleviating factors.   Review of Systems     Objective:   Physical Exam  Constitutional: She is oriented to person, place, and time. She appears well-developed and well-nourished.  HENT:  Head: Normocephalic and atraumatic.  Right Ear: External ear normal.  Left Ear: External ear normal.  Nose: Nose normal.  Mouth/Throat: Oropharynx is clear and moist.  TMs and canals are clear.   Eyes: Conjunctivae and EOM are normal. Pupils are equal, round, and reactive to light.  Neck: Neck supple. No thyromegaly present.  Cardiovascular: Normal rate, regular rhythm and normal heart sounds.   Pulmonary/Chest: Effort normal and breath sounds normal. She has no wheezes.  Lymphadenopathy:    She has no cervical adenopathy.  Neurological: She is alert and oriented to person, place, and time.  Skin: Skin is warm and dry.  Psychiatric: She has a normal mood and affect.          Assessment & Plan:  Acute left maxillary sinusitis-did encourage her to get a couple more days before filling the antibiotic since the nasal discharge is mostly clear. This still could be viral. Being that it has been a week and it's one-sided could still be bacterial. Perception given for azithromycin. Make sure using nasal saline rinse. Can use Tylenol or Advil as needed for pain and fever control. And call if not significantly better in one week.

## 2017-01-06 ENCOUNTER — Telehealth: Payer: Self-pay | Admitting: *Deleted

## 2017-01-06 NOTE — Telephone Encounter (Signed)
Pt calls today stating that the z-pack has not helped her & she still feels bad.  She wants to know if you could send in something different for her.  Please advise.

## 2017-01-09 ENCOUNTER — Telehealth: Payer: Self-pay | Admitting: *Deleted

## 2017-01-09 MED ORDER — LEVOFLOXACIN 500 MG PO TABS: 500.0000 mg | ORAL_TABLET | Freq: Every day | ORAL | 0 refills | Status: AC

## 2017-01-09 NOTE — Telephone Encounter (Signed)
New Rx sent to pharmacy.  Beatrice Lecher, MD

## 2017-01-10 ENCOUNTER — Other Ambulatory Visit: Payer: Self-pay | Admitting: Family Medicine

## 2017-01-10 NOTE — Telephone Encounter (Signed)
Pt called and wanted a stronger Abx she said she is still having a runny nose and headache.[name]

## 2017-01-10 NOTE — Telephone Encounter (Signed)
Patient's husband called and states that his wife -[name] needs her Hydrocodone refill and she thinks the Dr gave her a script last time she was here but isn't sure and IF she did--- the patient has misplaced it. Please call her husband back and let him know if he can come pick a script up. Thank you

## 2017-01-10 NOTE — Telephone Encounter (Signed)
Please see prior phone note.

## 2017-01-12 DIAGNOSIS — J3089 Other allergic rhinitis: Secondary | ICD-10-CM | POA: Diagnosis not present

## 2017-01-16 DIAGNOSIS — J3089 Other allergic rhinitis: Secondary | ICD-10-CM | POA: Diagnosis not present

## 2017-01-25 DIAGNOSIS — H8101 Meniere's disease, right ear: Secondary | ICD-10-CM | POA: Diagnosis not present

## 2017-01-25 DIAGNOSIS — H903 Sensorineural hearing loss, bilateral: Secondary | ICD-10-CM | POA: Diagnosis not present

## 2017-01-26 DIAGNOSIS — J3089 Other allergic rhinitis: Secondary | ICD-10-CM | POA: Diagnosis not present

## 2017-01-30 ENCOUNTER — Other Ambulatory Visit: Payer: Self-pay | Admitting: Emergency Medicine

## 2017-01-30 DIAGNOSIS — C50912 Malignant neoplasm of unspecified site of left female breast: Secondary | ICD-10-CM

## 2017-01-31 ENCOUNTER — Encounter: Payer: Self-pay | Admitting: Hematology and Oncology

## 2017-01-31 ENCOUNTER — Other Ambulatory Visit (HOSPITAL_BASED_OUTPATIENT_CLINIC_OR_DEPARTMENT_OTHER): Payer: Medicare HMO

## 2017-01-31 ENCOUNTER — Ambulatory Visit (HOSPITAL_BASED_OUTPATIENT_CLINIC_OR_DEPARTMENT_OTHER): Payer: Medicare HMO | Admitting: Hematology and Oncology

## 2017-01-31 DIAGNOSIS — C50912 Malignant neoplasm of unspecified site of left female breast: Secondary | ICD-10-CM

## 2017-01-31 DIAGNOSIS — C771 Secondary and unspecified malignant neoplasm of intrathoracic lymph nodes: Secondary | ICD-10-CM | POA: Diagnosis not present

## 2017-01-31 DIAGNOSIS — M810 Age-related osteoporosis without current pathological fracture: Secondary | ICD-10-CM | POA: Diagnosis not present

## 2017-01-31 DIAGNOSIS — C50919 Malignant neoplasm of unspecified site of unspecified female breast: Secondary | ICD-10-CM

## 2017-01-31 LAB — CBC WITH DIFFERENTIAL/PLATELET
BASO%: 1.6 % (ref 0.0–2.0)
BASOS ABS: 0.1 10*3/uL (ref 0.0–0.1)
EOS ABS: 0.1 10*3/uL (ref 0.0–0.5)
EOS%: 1.7 % (ref 0.0–7.0)
HEMATOCRIT: 38.3 % (ref 34.8–46.6)
HEMOGLOBIN: 12.7 g/dL (ref 11.6–15.9)
LYMPH#: 1.7 10*3/uL (ref 0.9–3.3)
LYMPH%: 44.6 % (ref 14.0–49.7)
MCH: 31.5 pg (ref 25.1–34.0)
MCHC: 33.2 g/dL (ref 31.5–36.0)
MCV: 95 fL (ref 79.5–101.0)
MONO#: 0.2 10*3/uL (ref 0.1–0.9)
MONO%: 5.6 % (ref 0.0–14.0)
NEUT%: 46.5 % (ref 38.4–76.8)
NEUTROS ABS: 1.8 10*3/uL (ref 1.5–6.5)
Platelets: 163 10*3/uL (ref 145–400)
RBC: 4.03 10*6/uL (ref 3.70–5.45)
RDW: 13.1 % (ref 11.2–14.5)
WBC: 3.8 10*3/uL — AB (ref 3.9–10.3)

## 2017-01-31 LAB — COMPREHENSIVE METABOLIC PANEL
ALBUMIN: 4 g/dL (ref 3.5–5.0)
ALK PHOS: 33 U/L — AB (ref 40–150)
ALT: 9 U/L (ref 0–55)
AST: 21 U/L (ref 5–34)
Anion Gap: 9 mEq/L (ref 3–11)
BILIRUBIN TOTAL: 0.29 mg/dL (ref 0.20–1.20)
BUN: 26 mg/dL (ref 7.0–26.0)
CALCIUM: 10.3 mg/dL (ref 8.4–10.4)
CO2: 31 mEq/L — ABNORMAL HIGH (ref 22–29)
Chloride: 103 mEq/L (ref 98–109)
Creatinine: 1.1 mg/dL (ref 0.6–1.1)
EGFR: 54 mL/min/{1.73_m2} — AB (ref >90)
GLUCOSE: 87 mg/dL (ref 70–140)
POTASSIUM: 4.1 meq/L (ref 3.5–5.1)
Sodium: 142 mEq/L (ref 136–145)
TOTAL PROTEIN: 6.3 g/dL — AB (ref 6.4–8.3)

## 2017-01-31 MED ORDER — TAMOXIFEN CITRATE 20 MG PO TABS: 20.0000 mg | ORAL_TABLET | Freq: Every day | ORAL | 3 refills | Status: DC

## 2017-01-31 NOTE — Progress Notes (Signed)
Patient Care Team: Hali Marry, MD as PCP - General Gordy Levan, MD as Consulting Physician (Hematology and Oncology) Leta Baptist, MD as Consulting Physician (Otolaryngology) Bjorn Loser, MD as Consulting Physician (Urology)  DIAGNOSIS:  Encounter Diagnosis  Name Primary?  . 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  Original resection 1999.   1999 Initial Diagnosis    Breast carcinoma, left.  Original resection 1999.      2007 Relapse/Recurrence    Recurrence of internal mammary lymph node      2007 -  Anti-estrogen oral therapy    Initially treated with anastrozole and later changed to Tamoxifen 20 mg daily because of osteoporosis, currently on Prolia       CHIEF COMPLIANT: Follow-up regarding her breast cancer, change in providers from Dr. Marko Plume to myself  INTERVAL HISTORY: [name] is a 71 year old with above-mentioned history of left breast cancer diagnosed in 1999 treated with surgery. I do not have the pathology report. I do not know the estrogen progesterone receptor status but she does not remember taking any anti-estrogen therapy afterwards. She was diagnosed with a recurrence in 2007 with internal mammary lymph node. It is not clear what was done about that. She subsequently undergone antiestrogen therapy initially with anastrozole and later changed to tamoxifen. She has remained on tamoxifen therapy has been doing very well. She denies any side effects from tamoxifen. She denies any lumps or nodules. 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  REVIEW OF SYSTEMS:   Constitutional: Denies fevers, chills or abnormal weight loss Eyes: Denies blurriness of vision Ears, nose, mouth, throat, and face: Denies mucositis or sore throat Respiratory: Denies cough, dyspnea or wheezes Cardiovascular: Denies  palpitation, chest discomfort Gastrointestinal:  Denies nausea, heartburn or change in bowel habits Skin: Denies abnormal skin rashes Lymphatics: Denies new lymphadenopathy or easy bruising Neurological:Denies numbness, tingling or new weaknesses Behavioral/Psych: Mood is stable, no new changes  Extremities: No lower extremity edema Breast:  denies any pain or lumps or nodules in either breasts All other systems were reviewed with the patient and are negative.  I have reviewed the past medical history, past surgical history, social history and family history with the patient and they are unchanged from previous note.  ALLERGIES:  is allergic to simvastatin; doxycycline; other; ampicillin; cefadroxil; and dicloxacillin.  MEDICATIONS:  Current Outpatient Prescriptions  Medication Sig Dispense Refill  . Calcium-Magnesium-Vitamin D (CALCIUM 1200+D3 PO) Take by mouth daily.     . carbamazepine (TEGRETOL XR) 100 MG 12 hr tablet Take 1 tablet (100 mg total) by mouth 2 (two) times daily. 180 tablet 3  . denosumab (PROLIA) 60 MG/ML SOLN injection Inject 60 mg into the skin every 6 (six) months. Administer in upper arm, thigh, or abdomen    . EPIPEN 2-PAK 0.3 MG/0.3ML SOAJ injection Reported on 02/01/2016  0  . fish oil-omega-3 fatty acids 1000 MG capsule Take 2 g by mouth daily.      . fluticasone (FLONASE) 50 MCG/ACT nasal spray Place 1 spray into both nostrils daily.  1  . HYDROcodone-acetaminophen (NORCO/VICODIN) 5-325 MG tablet TAKE 1 TABLET EVERY TWELVE HOURS AS NEEDED 60 tablet 0  . levocetirizine (XYZAL) 5 MG tablet Take 5 mg by mouth daily.      Marland Kitchen levothyroxine (SYNTHROID, LEVOTHROID) 50 MCG tablet Take 1 tablet (50 mcg total)  by mouth daily. 90 tablet 1  . omeprazole (PRILOSEC) 40 MG capsule Take 1 capsule (40 mg total) by mouth daily. 90 capsule 3  . pravastatin (PRAVACHOL) 40 MG tablet Take 1 tablet (40 mg total) by mouth daily. 90 tablet 3  . senna (SENOKOT) 8.6 MG tablet Take 2 tablets  by mouth at bedtime.     . senna-docusate (SENOKOT-S) 8.6-50 MG tablet Take 2 tablets by mouth at bedtime.    . sucralfate (CARAFATE) 1 G tablet TAKE 1 TABLET FOUR TIMES DAILY 120 tablet 0  . tamoxifen (NOLVADEX) 20 MG tablet Take 1 tablet (20 mg total) by mouth daily. 90 tablet 3  . traZODone (DESYREL) 50 MG tablet TAKE ONE-HALF - TWO TABLETS AT BEDTIME AS NEEDED SLEEP 180 tablet 1  . triamterene-hydrochlorothiazide (MAXZIDE-25) 37.5-25 MG tablet Take 1 tablet by mouth daily. 90 tablet 3   No current facility-administered medications for this visit.     PHYSICAL EXAMINATION: ECOG PERFORMANCE STATUS: 0 - Asymptomatic  Vitals:   01/31/17 1145  BP: 122/64  Pulse: 94  Resp: 18  Temp: 98.5 F (36.9 C)   Filed Weights   01/31/17 1145  Weight: 128 lb 1.6 oz (58.1 kg)    GENERAL:alert, no distress and comfortable SKIN: skin color, texture, turgor are normal, no rashes or significant lesions EYES: normal, Conjunctiva are pink and non-injected, sclera clear OROPHARYNX:no exudate, no erythema and lips, buccal mucosa, and tongue normal  NECK: supple, thyroid normal size, non-tender, without nodularity LYMPH:  no palpable lymphadenopathy in the cervical, axillary or inguinal LUNGS: clear to auscultation and percussion with normal breathing effort HEART: regular rate & rhythm and no murmurs and no lower extremity edema ABDOMEN:abdomen soft, non-tender and normal bowel sounds MUSCULOSKELETAL:no cyanosis of digits and no clubbing  NEURO: alert & oriented x 3 with fluent speech, no focal motor/sensory deficits EXTREMITIES: No lower extremity edema   LABORATORY DATA:  I have reviewed the data as listed   Chemistry      Component Value Date/Time   NA 142 01/31/2017 1110   K 4.1 01/31/2017 1110   CL 99 03/17/2016 1056   CL 98 10/02/2012 1125   CO2 31 (H) 01/31/2017 1110   BUN 26.0 01/31/2017 1110   CREATININE 1.1 01/31/2017 1110      Component Value Date/Time   CALCIUM 10.3  01/31/2017 1110   ALKPHOS 33 (L) 01/31/2017 1110   AST 21 01/31/2017 1110   ALT 9 01/31/2017 1110   BILITOT 0.29 01/31/2017 1110       Lab Results  Component Value Date   WBC 3.8 (L) 01/31/2017   HGB 12.7 01/31/2017   HCT 38.3 01/31/2017   MCV 95.0 01/31/2017   PLT 163 01/31/2017   NEUTROABS 1.8 01/31/2017    ASSESSMENT & PLAN:  Breast carcinoma, left.  Original resection 1999. Left breast carcinoma stage 1 1999 then recurrence in internal mammary node in 2007: treated then with chemo and resection of the involved node, then hormone blockers ongoing.Aromatase inhibitor changed to tamoxifen with osteoporosis and arhtralgias.   Current treatment: Tamoxifen 20 mg daily Tamoxifen toxicities: Osteoporosis: Currently on Prolia along with calcium and vitamin D and weightbearing exercises  I discussed with the patient that since she had completed 10 years of antiestrogen therapy with tamoxifen, there is no data for extended therapy PR 10 years at this time. However the patient is interested and willing to continue with the antiestrogen therapy without any significant clinical benefits. I instructed her to let us  know if she wishes to stop the medication. I might consider doing a CT of the chest to evaluate internal mammary lymph node before stopping her treatment.  Surveillance: 1. Breast exam 01/31/2017: Benign 2. mammogram 4 2018: No mammographic evidence of malignancy   I attempted to review her history by reviewing her records and discussing with her. Patient does not seem to remember much of the details of her cancer history. I spent almost 45 minutes trying to obtain this data to complete the medical record.  I spent 45 minutes talking to the patient of which more than half was spent in counseling and coordination of care.  No orders of the defined types were placed in this encounter.  The patient has a good understanding of the overall plan. she agrees with it. she will call  with any problems that may develop before the next visit here.   Rulon Eisenmenger, MD 01/31/17

## 2017-01-31 NOTE — Assessment & Plan Note (Signed)
Left breast carcinoma stage 1 1999 then recurrence in internal mammary node in 2007: treated then with chemo and resection of the involved node, then hormone blockers ongoing.Aromatase inhibitor changed to tamoxifen with osteoporosis and arhtralgias.   Current treatment: Tamoxifen 20 mg daily Tamoxifen toxicities: Osteoporosis: Currently on Prolia along with calcium and vitamin D and weightbearing exercises  Surveillance: 1. Breast exam 01/31/2017: Benign 2. mammogram 4 2018: No mammographic evidence of malignancy

## 2017-02-01 DIAGNOSIS — J3089 Other allergic rhinitis: Secondary | ICD-10-CM | POA: Diagnosis not present

## 2017-02-07 ENCOUNTER — Ambulatory Visit: Payer: Medicare HMO

## 2017-02-07 DIAGNOSIS — J3089 Other allergic rhinitis: Secondary | ICD-10-CM | POA: Diagnosis not present

## 2017-02-08 ENCOUNTER — Ambulatory Visit (INDEPENDENT_AMBULATORY_CARE_PROVIDER_SITE_OTHER): Payer: Medicare HMO | Admitting: Physician Assistant

## 2017-02-08 VITALS — BP 125/68 | HR 82

## 2017-02-08 DIAGNOSIS — M810 Age-related osteoporosis without current pathological fracture: Secondary | ICD-10-CM

## 2017-02-08 DIAGNOSIS — T386X5A Adverse effect of antigonadotrophins, antiestrogens, antiandrogens, not elsewhere classified, initial encounter: Principal | ICD-10-CM

## 2017-02-08 DIAGNOSIS — M818 Other osteoporosis without current pathological fracture: Secondary | ICD-10-CM

## 2017-02-08 MED ORDER — DENOSUMAB 60 MG/ML ~~LOC~~ SOLN
60.0000 mg | Freq: Once | SUBCUTANEOUS | Status: AC
  Administered 2017-02-08: 60 mg via SUBCUTANEOUS

## 2017-02-08 NOTE — Progress Notes (Signed)
Pt came into clinic today for her 6 month Prolia injection. The Pt has been getting these injections for a "couple years" now, reports no negative side effects. Pt tolerated injection in left abdomen well, no immediate complications. Advised to contact clinic in December for next injection to be scheduled. At that time, we will order labs and the Rx. Verbalized understanding and sticker reminder for her calender provided.    Agree with above plan. Iran Planas PA-C

## 2017-02-20 ENCOUNTER — Other Ambulatory Visit: Payer: Self-pay

## 2017-02-20 MED ORDER — HYDROCODONE-ACETAMINOPHEN 5-325 MG PO TABS: ORAL_TABLET | ORAL | 0 refills | Status: DC

## 2017-02-22 ENCOUNTER — Encounter: Payer: Self-pay | Admitting: Family Medicine

## 2017-02-22 ENCOUNTER — Ambulatory Visit (INDEPENDENT_AMBULATORY_CARE_PROVIDER_SITE_OTHER): Payer: Medicare HMO | Admitting: Family Medicine

## 2017-02-22 VITALS — BP 114/71 | HR 104 | Temp 98.0°F | Ht 67.0 in | Wt 126.0 lb

## 2017-02-22 DIAGNOSIS — G500 Trigeminal neuralgia: Secondary | ICD-10-CM

## 2017-02-22 DIAGNOSIS — M5030 Other cervical disc degeneration, unspecified cervical region: Secondary | ICD-10-CM | POA: Diagnosis not present

## 2017-02-22 DIAGNOSIS — M810 Age-related osteoporosis without current pathological fracture: Secondary | ICD-10-CM

## 2017-02-22 DIAGNOSIS — M5136 Other intervertebral disc degeneration, lumbar region: Secondary | ICD-10-CM

## 2017-02-22 DIAGNOSIS — G8929 Other chronic pain: Secondary | ICD-10-CM | POA: Diagnosis not present

## 2017-02-22 DIAGNOSIS — J029 Acute pharyngitis, unspecified: Secondary | ICD-10-CM | POA: Diagnosis not present

## 2017-02-22 LAB — POCT RAPID STREP A (OFFICE): RAPID STREP A SCREEN: NEGATIVE

## 2017-02-22 NOTE — Progress Notes (Signed)
Subjective:    CC:   HPI: 3 mo follow-up chronic pain management for chronic neck and low back pain with degenerative disc disease in both locations. She denies any side effects including excess sedation, nausea, constipation etc. on the hydrocodone.  She also complains of a sore throat for about 4 days and feels like she seems some white patches. No fevers chills or sweats. No difficulty swallowing. No swollen glands in the throat. She says it's mostly just the right tonsil she's concerned about. She has felt some fatigue.  Osteoporosis-she did get her Prolia injection last month and did well with it. She's been on this for quite some time.  Past medical history, Surgical history, Family history not pertinant except as noted below, Social history, Allergies, and medications have been entered into the medical record, reviewed, and corrections made.   Review of Systems: No fevers, chills, night sweats, weight loss, chest pain, or shortness of breath.   Objective:    General: Well Developed, well nourished, and in no acute distress.  Neuro: Alert and oriented x3, extra-ocular muscles intact, sensation grossly intact.  HEENT: Normocephalic, atraumatic, OP is clear, has what looks like a tonsillar stone the right tonsil. A cervical lymphadenopathy.  Skin: Warm and dry, no rashes. Cardiac: Regular rate and rhythm, no murmurs rubs or gallops, no lower extremity edema.  Respiratory: Clear to auscultation bilaterally. Not using accessory muscles, speaking in full sentences.   Impression and Recommendations:    Chronic pain management/chronic neck low back/degenerative disc disease-pain contract and urine drug screen are up-to-date. Medication refill today. She's not having any side effects.  Pharyngitis-negative rapid strep test. Most consistent with tonsillar stone. Call if any new symptoms.  Trigeminal neuralgia-she is scheduled to see Dr. Berdine Addison on July 18 for a new patient consultation.  Currently on carbamazepine

## 2017-02-23 DIAGNOSIS — J3089 Other allergic rhinitis: Secondary | ICD-10-CM | POA: Diagnosis not present

## 2017-03-02 ENCOUNTER — Ambulatory Visit (INDEPENDENT_AMBULATORY_CARE_PROVIDER_SITE_OTHER): Payer: Medicare HMO | Admitting: Family Medicine

## 2017-03-02 ENCOUNTER — Encounter: Payer: Self-pay | Admitting: Family Medicine

## 2017-03-02 VITALS — BP 114/72 | HR 91 | Resp 18 | Wt 126.0 lb

## 2017-03-02 DIAGNOSIS — L989 Disorder of the skin and subcutaneous tissue, unspecified: Secondary | ICD-10-CM | POA: Diagnosis not present

## 2017-03-02 DIAGNOSIS — D0462 Carcinoma in situ of skin of left upper limb, including shoulder: Secondary | ICD-10-CM | POA: Diagnosis not present

## 2017-03-02 NOTE — Progress Notes (Signed)
   Subjective:    Patient ID: [name], female    DOB: Sep 01, 1945, 70 y.o.   MRN: [id number]  HPI 71 year old female comes in today to discuss biopsy of a skin lesion On the posterior left wrist. She says she noticed it a couple of weeks ago. She said something hard came out of it but otherwise no drainage or bleeding. No itching. But it is also not healing.   Review of Systems     Objective:   Physical Exam  Constitutional: She is oriented to person, place, and time. She appears well-developed and well-nourished.  HENT:  Head: Normocephalic and atraumatic.  Eyes: Conjunctivae and EOM are normal.  Cardiovascular: Normal rate.   Pulmonary/Chest: Effort normal.  Neurological: She is alert and oriented to person, place, and time.  Skin: Skin is dry. No pallor.  She has a 6 x 9 mm raised scaly lesion on the dorsum of the left wrist. No drainage. Some mild erythema around the edge.  Psychiatric: She has a normal mood and affect. Her behavior is normal.  Vitals reviewed.      Assessment & Plan:  Suspicious skin lesion-shave biopsy recommended. Evaluate for screening cell carcinoma. Vision tolerated procedure well. Follow-up wound care discussed. Return if any sign of infection.  Shave Biopsy Procedure Note  Pre-operative Diagnosis: Suspicious lesion  Post-operative Diagnosis: same  Locations:left posterior wrist  Indications: not healing   Anesthesia: Bupivacaine 0.5% with epi without added sodium bicarbonate  Procedure Details  Patient informed of the risks (including bleeding and infection) and benefits of the  procedure and Verbal informed consent obtained.  The lesion and surrounding area were given a sterile prep using chlorhexidine and draped in the usual sterile fashion. A scalpel was used to shave an area of skin approximately 1cm by 1cm.  Hemostasis achieved with alumuninum chloride. Antibiotic ointment and a sterile dressing applied.  The specimen was sent for  pathologic examination. The patient tolerated the procedure well.  EBL: trace  Findings: Await pathology   Condition: Stable  Complications: none.  Plan: 1. Instructed to keep the wound dry and covered for 24-48h and clean thereafter. 2. Warning signs of infection were reviewed.   3. Recommended that the patient use OTC acetaminophen as needed for pain.  4. Return PRN.

## 2017-03-02 NOTE — Patient Instructions (Signed)
OK to remove bandage in the AM. Apply vaseline to wound. Can keep loose bandage (band-aid) on for 2 days.

## 2017-03-06 ENCOUNTER — Other Ambulatory Visit: Payer: Self-pay

## 2017-03-06 DIAGNOSIS — C4492 Squamous cell carcinoma of skin, unspecified: Secondary | ICD-10-CM

## 2017-03-08 DIAGNOSIS — J3089 Other allergic rhinitis: Secondary | ICD-10-CM | POA: Diagnosis not present

## 2017-03-14 DIAGNOSIS — H524 Presbyopia: Secondary | ICD-10-CM | POA: Diagnosis not present

## 2017-03-14 DIAGNOSIS — H2513 Age-related nuclear cataract, bilateral: Secondary | ICD-10-CM | POA: Diagnosis not present

## 2017-03-14 DIAGNOSIS — H401131 Primary open-angle glaucoma, bilateral, mild stage: Secondary | ICD-10-CM | POA: Diagnosis not present

## 2017-03-14 DIAGNOSIS — J3089 Other allergic rhinitis: Secondary | ICD-10-CM | POA: Diagnosis not present

## 2017-03-14 DIAGNOSIS — H1851 Endothelial corneal dystrophy: Secondary | ICD-10-CM | POA: Diagnosis not present

## 2017-03-14 DIAGNOSIS — H5213 Myopia, bilateral: Secondary | ICD-10-CM | POA: Diagnosis not present

## 2017-03-14 DIAGNOSIS — H52223 Regular astigmatism, bilateral: Secondary | ICD-10-CM | POA: Diagnosis not present

## 2017-03-20 ENCOUNTER — Ambulatory Visit: Payer: Medicare Other | Admitting: Neurology

## 2017-03-22 ENCOUNTER — Other Ambulatory Visit: Payer: Self-pay | Admitting: Dermatology

## 2017-03-22 DIAGNOSIS — C44629 Squamous cell carcinoma of skin of left upper limb, including shoulder: Secondary | ICD-10-CM | POA: Diagnosis not present

## 2017-03-22 DIAGNOSIS — L570 Actinic keratosis: Secondary | ICD-10-CM | POA: Diagnosis not present

## 2017-03-22 DIAGNOSIS — L988 Other specified disorders of the skin and subcutaneous tissue: Secondary | ICD-10-CM | POA: Diagnosis not present

## 2017-03-22 DIAGNOSIS — L820 Inflamed seborrheic keratosis: Secondary | ICD-10-CM | POA: Diagnosis not present

## 2017-03-27 DIAGNOSIS — J3089 Other allergic rhinitis: Secondary | ICD-10-CM | POA: Diagnosis not present

## 2017-04-03 ENCOUNTER — Other Ambulatory Visit: Payer: Self-pay

## 2017-04-03 NOTE — Telephone Encounter (Signed)
Patient called requested a refill for Hydrocodone. Rx was last filled on 02/20/2017.Please advise if ok to refill. Rhonda Cunningham,CMA

## 2017-04-03 NOTE — Telephone Encounter (Signed)
Ok to refill 

## 2017-04-04 ENCOUNTER — Encounter: Payer: Self-pay | Admitting: Family Medicine

## 2017-04-04 ENCOUNTER — Ambulatory Visit: Payer: Medicare HMO | Admitting: Osteopathic Medicine

## 2017-04-04 ENCOUNTER — Ambulatory Visit (INDEPENDENT_AMBULATORY_CARE_PROVIDER_SITE_OTHER): Payer: Medicare HMO | Admitting: Family Medicine

## 2017-04-04 VITALS — BP 133/91 | HR 112

## 2017-04-04 DIAGNOSIS — R413 Other amnesia: Secondary | ICD-10-CM

## 2017-04-04 DIAGNOSIS — R002 Palpitations: Secondary | ICD-10-CM

## 2017-04-04 DIAGNOSIS — R5383 Other fatigue: Secondary | ICD-10-CM

## 2017-04-04 DIAGNOSIS — Z1321 Encounter for screening for nutritional disorder: Secondary | ICD-10-CM | POA: Diagnosis not present

## 2017-04-04 MED ORDER — HYDROCODONE-ACETAMINOPHEN 5-325 MG PO TABS: ORAL_TABLET | ORAL | 0 refills | Status: DC

## 2017-04-04 NOTE — Telephone Encounter (Signed)
Rx has been printed and put in the PCP basket for a signature. Amerika Nourse,CMA

## 2017-04-04 NOTE — Patient Instructions (Signed)
Stop the trazodone immediately Call back if not improving in one week if we need to do further memory testing.

## 2017-04-04 NOTE — Progress Notes (Signed)
Subjective:    Patient ID: [name], female    DOB: 01/12/1946, 71 y.o.   MRN: [id number]  HPI 2o-year-old female with a prior history of breast cancer comes in today with a couple of complaints. In particular she's been experiencing palpitations. This happened yesterday where she felt like her heart was racing. Her husband said he felt her heartbeat and listen to it. He said it would seem regular for about 10 beats or so and then a little irregular for several beats and then it was regular again. She did not ask parents any chest pain or shortness of breath or radiation of pain into the neck or arms at that time.  She and her husband are also concerned that she's had been having more memory problems over the last 3-4 months. They read the side effect profile on trazodone and wondered if that could be causing some of the symptoms. She's been using trazodone since 2016 but not nightly but over the last 6 months she's been using it more consistently and oftentimes will take a second tablet if the first tab doesn't work. Her husband reports that she is forgetting conversations that they just had maybe a few minutes or 30 minutes prior. Not really affecting her long-term memory. She's not losing objects and she's not having difficulty remembering names etc.   Review of Systems   + fatigue   BP (!) 133/91   Pulse (!) 112   SpO2 98%     Allergies  Allergen Reactions  . Simvastatin Other (See Comments)    Aches and memory loss  . Doxycycline Itching  . Other Rash  . Ampicillin Rash  . Cefadroxil Rash  . Dicloxacillin Rash    Past Medical History:  Diagnosis Date  . Breast cancer ([place]) 1999   T1N0 LEFT BREAST  . 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 Degenerative disc disease   . History of bladder infections    RECURRENT  . Hyperlipidemia   . Osteoporosis 03-04-2011    LAST BONE DENSITY 03-04-2011  . Recurrent sinusitis   .  Thyroid disease   . Trigeminal neuralgia     Past Surgical History:  Procedure Laterality Date  . ABDOMINAL HYSTERECTOMY  2005   Complete  . BLADDER SUSPENSION  2005  . BREAST SURGERY  1999   Lumpectomy L Br  . BREAST SURGERY  2007   Lymph Node Removal L Chest Wall  . cancerous LN removed  2008  . left breast lumpectomy  1999    Social History   Social History  . Marital status: Married    Spouse name: [name]   . Number of children: 3  . Years of education: RN   Occupational History  .      Retired- Therapist, sports   Social History Main Topics  . Smoking status: Never Smoker  . Smokeless tobacco: Never Used  . Alcohol use No  . Drug use: No  . Sexual activity: Not on file     Comment: housewife, married, 3 kids.   Other Topics Concern  . Not on file   Social History Narrative   Patient lives at home with her husband [name])   Retired - RN   Right handed.   Caffeine two cups coffee daily and one sweet tea.    Family History  Problem Relation Age of Onset  . Cancer Mother        breast  .  Stroke Mother   . Hyperlipidemia Mother   . Cancer Father        bladder  . Colon cancer Father   . Cancer Paternal Grandmother        Mouth  . Cancer Son        Breast  . Cancer Maternal Aunt        Breast  . Stomach cancer Paternal Uncle   . Stomach cancer Paternal Uncle     Outpatient Encounter Prescriptions as of 04/04/2017  Medication Sig  . Calcium-Magnesium-Vitamin D (CALCIUM 1200+D3 PO) Take by mouth daily.   . carbamazepine (TEGRETOL XR) 100 MG 12 hr tablet Take 1 tablet (100 mg total) by mouth 2 (two) times daily.  Marland Kitchen denosumab (PROLIA) 60 MG/ML SOLN injection Inject 60 mg into the skin every 6 (six) months. Administer in upper arm, thigh, or abdomen  . EPIPEN 2-PAK 0.3 MG/0.3ML SOAJ injection Reported on 02/01/2016  . fish oil-omega-3 fatty acids 1000 MG capsule Take 2 g by mouth daily.    . fluticasone (FLONASE) 50 MCG/ACT nasal spray Place 1 spray into both nostrils  daily.  Marland Kitchen HYDROcodone-acetaminophen (NORCO/VICODIN) 5-325 MG tablet TAKE 1 TABLET EVERY TWELVE HOURS AS NEEDED  . levocetirizine (XYZAL) 5 MG tablet Take 5 mg by mouth daily.    Marland Kitchen levothyroxine (SYNTHROID, LEVOTHROID) 50 MCG tablet Take 1 tablet (50 mcg total) by mouth daily.  Marland Kitchen omeprazole (PRILOSEC) 40 MG capsule Take 1 capsule (40 mg total) by mouth daily.  . pravastatin (PRAVACHOL) 40 MG tablet Take 1 tablet (40 mg total) by mouth daily.  Marland Kitchen senna (SENOKOT) 8.6 MG tablet Take 2 tablets by mouth at bedtime.   . senna-docusate (SENOKOT-S) 8.6-50 MG tablet Take 2 tablets by mouth at bedtime.  . sucralfate (CARAFATE) 1 G tablet TAKE 1 TABLET FOUR TIMES DAILY  . tamoxifen (NOLVADEX) 20 MG tablet Take 1 tablet (20 mg total) by mouth daily.  . traZODone (DESYREL) 50 MG tablet TAKE ONE-HALF - TWO TABLETS AT BEDTIME AS NEEDED SLEEP  . triamterene-hydrochlorothiazide (MAXZIDE-25) 37.5-25 MG tablet Take 1 tablet by mouth daily.   No facility-administered encounter medications on file as of 04/04/2017.          Objective:   Physical Exam  Constitutional: She is oriented to person, place, and time. She appears well-developed and well-nourished.  HENT:  Head: Normocephalic and atraumatic.  Right Ear: External ear normal.  Left Ear: External ear normal.  Nose: Nose normal.  Mouth/Throat: Oropharynx is clear and moist.  TMs and canals are clear.   Eyes: Pupils are equal, round, and reactive to light. Conjunctivae and EOM are normal.  Neck: Neck supple. No thyromegaly present.  Cardiovascular: Normal rate, regular rhythm and normal heart sounds.   Pulmonary/Chest: Effort normal and breath sounds normal. She has no wheezes.  Musculoskeletal: She exhibits no edema.  Lymphadenopathy:    She has no cervical adenopathy.  Neurological: She is alert and oriented to person, place, and time.  Skin: Skin is warm and dry.  Psychiatric: She has a normal mood and affect.          Assessment & Plan:   Palpitations-she is tachycardic on exam today. She tends to run a slightly elevated pulse at baseline. Do not hear any arrhythmia on exam but she is also not feeling the palpitations as she was last night. Episode last night lasted about an hour or more.EKG today shows rate of 85 bpm, normal sinus rhythm with no acute ST-T wave changes.  Certainly if episodes recur then consider event monitor  Memory impairment- 6 CIT core of 10 which is significant.  Discussed options.  Certainly could be trazodone causing some of her symptoms are want her to stop immediately.

## 2017-04-05 LAB — CBC WITH DIFFERENTIAL/PLATELET
BASOS PCT: 0 %
Basophils Absolute: 0 cells/uL (ref 0–200)
EOS ABS: 39 {cells}/uL (ref 15–500)
Eosinophils Relative: 1 %
HCT: 37.2 % (ref 35.0–45.0)
Hemoglobin: 12.3 g/dL (ref 11.7–15.5)
LYMPHS ABS: 1443 {cells}/uL (ref 850–3900)
Lymphocytes Relative: 37 %
MCH: 31.1 pg (ref 27.0–33.0)
MCHC: 33.1 g/dL (ref 32.0–36.0)
MCV: 93.9 fL (ref 80.0–100.0)
MPV: 10.5 fL (ref 7.5–12.5)
Monocytes Absolute: 195 cells/uL — ABNORMAL LOW (ref 200–950)
Monocytes Relative: 5 %
NEUTROS ABS: 2223 {cells}/uL (ref 1500–7800)
Neutrophils Relative %: 57 %
Platelets: 207 10*3/uL (ref 140–400)
RBC: 3.96 MIL/uL (ref 3.80–5.10)
RDW: 13.5 % (ref 11.0–15.0)
WBC: 3.9 10*3/uL (ref 3.8–10.8)

## 2017-04-05 LAB — COMPLETE METABOLIC PANEL WITH GFR
ALBUMIN: 4.8 g/dL (ref 3.6–5.1)
ALK PHOS: 29 U/L — AB (ref 33–130)
ALT: 12 U/L (ref 6–29)
AST: 22 U/L (ref 10–35)
BUN: 22 mg/dL (ref 7–25)
CALCIUM: 10.3 mg/dL (ref 8.6–10.4)
CO2: 30 mmol/L (ref 20–32)
CREATININE: 1.08 mg/dL — AB (ref 0.60–0.93)
Chloride: 100 mmol/L (ref 98–110)
GFR, Est African American: 60 mL/min (ref >=60)
GFR, Est Non African American: 52 mL/min — ABNORMAL LOW (ref >=60)
Glucose, Bld: 92 mg/dL (ref 65–99)
Potassium: 4 mmol/L (ref 3.5–5.3)
Sodium: 141 mmol/L (ref 135–146)
Total Bilirubin: 0.3 mg/dL (ref 0.2–1.2)
Total Protein: 6.6 g/dL (ref 6.1–8.1)

## 2017-04-05 LAB — URINALYSIS
Bilirubin Urine: NEGATIVE
Glucose, UA: NEGATIVE
Hgb urine dipstick: NEGATIVE
Ketones, ur: NEGATIVE
LEUKOCYTES UA: NEGATIVE
NITRITE: NEGATIVE
PH: 6 (ref 5.0–8.0)
Protein, ur: NEGATIVE
SPECIFIC GRAVITY, URINE: 1.022 (ref 1.001–1.035)

## 2017-04-05 LAB — VITAMIN B12: VITAMIN B 12: 596 pg/mL (ref 200–1100)

## 2017-04-05 LAB — TSH: TSH: 3.57 mIU/L

## 2017-04-08 LAB — VITAMIN B6: Vitamin B6: 14.6 ng/mL (ref 2.1–21.7)

## 2017-04-10 LAB — VITAMIN B1: Vitamin B1 (Thiamine): 12 nmol/L (ref 8–30)

## 2017-04-12 ENCOUNTER — Encounter: Payer: Self-pay | Admitting: Family Medicine

## 2017-04-12 ENCOUNTER — Ambulatory Visit (INDEPENDENT_AMBULATORY_CARE_PROVIDER_SITE_OTHER): Payer: Medicare HMO | Admitting: Family Medicine

## 2017-04-12 VITALS — BP 127/61 | HR 107 | Wt 125.0 lb

## 2017-04-12 DIAGNOSIS — R413 Other amnesia: Secondary | ICD-10-CM

## 2017-04-12 DIAGNOSIS — R69 Illness, unspecified: Secondary | ICD-10-CM | POA: Diagnosis not present

## 2017-04-12 DIAGNOSIS — T887XXA Unspecified adverse effect of drug or medicament, initial encounter: Secondary | ICD-10-CM | POA: Diagnosis not present

## 2017-04-12 DIAGNOSIS — R002 Palpitations: Secondary | ICD-10-CM | POA: Diagnosis not present

## 2017-04-12 DIAGNOSIS — J3089 Other allergic rhinitis: Secondary | ICD-10-CM | POA: Diagnosis not present

## 2017-04-12 DIAGNOSIS — F5101 Primary insomnia: Secondary | ICD-10-CM

## 2017-04-12 NOTE — Progress Notes (Signed)
   Subjective:    Patient ID: [name], female    DOB: 09/15/1945, 71 y.o.   MRN: [id number]  HPI   F/U palpitations - Overall she is doing much better. When I saw her about a week ago she came in with her husband. She was having significant memory Memory. She Was Having Difficulty Processing during the Daytime. And She Had Been Expressing More Frequent Palpitations. We Felt at the Time That Her Trazodone Could Be a Culprit That She Actually Been on It on and off for Several Years.  She's been off of for a week and says it is completely different. She is much more mentally clear. She has much more energy during the daytime. And her palpitations have most completely resolved. She says she still noticed a heart fast heart rate occasionally but it's not bothersome.    Review of Systems     Objective:   Physical Exam  Constitutional: She is oriented to person, place, and time. She appears well-developed and well-nourished.  HENT:  Head: Normocephalic and atraumatic.  Eyes: Conjunctivae and EOM are normal.  Cardiovascular: Normal rate.   Pulmonary/Chest: Effort normal.  Neurological: She is alert and oriented to person, place, and time.  Skin: Skin is dry. No pallor.  Psychiatric: She has a normal mood and affect. Her behavior is normal.  Vitals reviewed.         Assessment & Plan:  Medication side effect-will hide trazodone to her intolerance list. Symptoms resolve now that she is off the medication.  Memory impairment-resolved off of medication  Palpitations-significantly improved off the medication. Still monitor for frequency of symptoms.  Insomnia- discussed avoiding medication at this time and just continue to work on sleep hygiene.

## 2017-04-12 NOTE — Addendum Note (Signed)
Addended by: Huel Cote on: 04/12/2017 09:24 AM   Modules accepted: Orders

## 2017-04-25 DIAGNOSIS — J3089 Other allergic rhinitis: Secondary | ICD-10-CM | POA: Diagnosis not present

## 2017-04-26 ENCOUNTER — Ambulatory Visit
Admission: RE | Admit: 2017-04-26 | Discharge: 2017-04-26 | Disposition: A | Discharge status: Home / Self Care | Payer: Medicare HMO | Source: Ambulatory Visit | Attending: Family Medicine | Admitting: Family Medicine

## 2017-04-26 DIAGNOSIS — M85852 Other specified disorders of bone density and structure, left thigh: Secondary | ICD-10-CM | POA: Diagnosis not present

## 2017-04-26 DIAGNOSIS — M810 Age-related osteoporosis without current pathological fracture: Secondary | ICD-10-CM

## 2017-04-26 DIAGNOSIS — Z780 Asymptomatic menopausal state: Secondary | ICD-10-CM | POA: Diagnosis not present

## 2017-05-09 ENCOUNTER — Other Ambulatory Visit: Payer: Self-pay | Admitting: *Deleted

## 2017-05-09 MED ORDER — HYDROCODONE-ACETAMINOPHEN 5-325 MG PO TABS: ORAL_TABLET | ORAL | 0 refills | Status: DC

## 2017-05-10 ENCOUNTER — Other Ambulatory Visit: Payer: Self-pay | Admitting: *Deleted

## 2017-05-10 MED ORDER — HYDROCODONE-ACETAMINOPHEN 5-325 MG PO TABS: ORAL_TABLET | ORAL | 0 refills | Status: DC

## 2017-05-22 DIAGNOSIS — J3089 Other allergic rhinitis: Secondary | ICD-10-CM | POA: Diagnosis not present

## 2017-05-25 ENCOUNTER — Ambulatory Visit (INDEPENDENT_AMBULATORY_CARE_PROVIDER_SITE_OTHER): Payer: Medicare HMO | Admitting: Family Medicine

## 2017-05-25 ENCOUNTER — Encounter: Payer: Self-pay | Admitting: Family Medicine

## 2017-05-25 VITALS — BP 152/81 | HR 100 | Wt 127.0 lb

## 2017-05-25 DIAGNOSIS — R030 Elevated blood-pressure reading, without diagnosis of hypertension: Secondary | ICD-10-CM

## 2017-05-25 DIAGNOSIS — Z23 Encounter for immunization: Secondary | ICD-10-CM

## 2017-05-25 DIAGNOSIS — E782 Mixed hyperlipidemia: Secondary | ICD-10-CM | POA: Diagnosis not present

## 2017-05-25 DIAGNOSIS — M5030 Other cervical disc degeneration, unspecified cervical region: Secondary | ICD-10-CM | POA: Diagnosis not present

## 2017-05-25 DIAGNOSIS — G8929 Other chronic pain: Secondary | ICD-10-CM | POA: Diagnosis not present

## 2017-05-25 DIAGNOSIS — M5136 Other intervertebral disc degeneration, lumbar region: Secondary | ICD-10-CM | POA: Diagnosis not present

## 2017-05-25 MED ORDER — HYDROCODONE-ACETAMINOPHEN 5-325 MG PO TABS: ORAL_TABLET | ORAL | 0 refills | Status: DC

## 2017-05-25 NOTE — Addendum Note (Signed)
Addended by: Terance Hart on: 05/25/2017 01:36 PM   Modules accepted: Orders

## 2017-05-25 NOTE — Progress Notes (Signed)
Subjective:    Patient ID: [name], female    DOB: February 21, 1946, 71 y.o.   MRN: [id number]  HPI 3 mo follow-up chronic pain management for chronic neck and low back pain with degenerative disc disease in both locations. She denies any side effects including excess sedation, nausea, constipation etc. on the hydrocodone.Overall she is doing okay. Someday she doesn't have to take her pain medication but Sunday she does have to take it twice a day. Over the last couple of weeks she's been preparing for the state fair which is in the middle of October. 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 Says she's been extra busy and has been using her pain medication once to twice a day most days.   Hyperlipidemia-currently on pravastatin 40 mg. Tolerating it well without any myalgias or side effects.  Review of Systems  No recent chest pain shortness of breath or palpitations.  BP (!) 146/79   Pulse 100   Wt 127 lb (57.6 kg)   BMI 19.89 kg/m     Allergies  Allergen Reactions  . Simvastatin Other (See Comments)    Aches and memory loss  . Doxycycline Itching  . Other Rash  . Trazodone And Nefazodone Other (See Comments)    Memory impairment and palpitations.   . Ampicillin Rash  . Cefadroxil Rash  . Dicloxacillin Rash    Past Medical History:  Diagnosis Date  . Breast cancer ([place]) 1999   T1N0 LEFT BREAST  . 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 Degenerative disc disease   . History of bladder infections    RECURRENT  . Hyperlipidemia   . Osteoporosis 03-04-2011    LAST BONE DENSITY 03-04-2011  . Recurrent sinusitis   . Thyroid disease   . Trigeminal neuralgia     Past Surgical History:  Procedure Laterality Date  . ABDOMINAL HYSTERECTOMY  2005   Complete  . BLADDER SUSPENSION  2005  . BREAST SURGERY  1999   Lumpectomy L Br  . BREAST SURGERY  2007   Lymph Node Removal L Chest Wall  . cancerous LN removed  2008  .  left breast lumpectomy  1999    Social History   Social History  . Marital status: Married    Spouse name: [name]   . Number of children: 3  . Years of education: RN   Occupational History  .      Retired- Therapist, sports   Social History Main Topics  . Smoking status: Never Smoker  . Smokeless tobacco: Never Used  . Alcohol use No  . Drug use: No  . Sexual activity: Not on file     Comment: housewife, married, 3 kids.   Other Topics Concern  . Not on file   Social History Narrative   Patient lives at home with her husband [name])   Retired - RN   Right handed.   Caffeine two cups coffee daily and one sweet tea.    Family History  Problem Relation Age of Onset  . Cancer Mother        breast  . Stroke Mother   . Hyperlipidemia Mother   . Cancer Father        bladder  . Colon cancer Father   . Cancer Paternal Grandmother        Mouth  . Cancer Son        Breast  . Cancer Maternal Aunt  Breast  . Stomach cancer Paternal Uncle   . Stomach cancer Paternal Uncle     Outpatient Encounter Prescriptions as of 05/25/2017  Medication Sig  . Calcium-Magnesium-Vitamin D (CALCIUM 1200+D3 PO) Take by mouth daily.   . carbamazepine (TEGRETOL XR) 100 MG 12 hr tablet Take 1 tablet (100 mg total) by mouth 2 (two) times daily.  Marland Kitchen denosumab (PROLIA) 60 MG/ML SOLN injection Inject 60 mg into the skin every 6 (six) months. Administer in upper arm, thigh, or abdomen  . EPIPEN 2-PAK 0.3 MG/0.3ML SOAJ injection Reported on 02/01/2016  . fish oil-omega-3 fatty acids 1000 MG capsule Take 2 g by mouth daily.    . fluticasone (FLONASE) 50 MCG/ACT nasal spray Place 1 spray into both nostrils daily.  Marland Kitchen HYDROcodone-acetaminophen (NORCO/VICODIN) 5-325 MG tablet TAKE 1 TABLET EVERY TWELVE HOURS AS NEEDED  . levocetirizine (XYZAL) 5 MG tablet Take 5 mg by mouth daily.    Marland Kitchen levothyroxine (SYNTHROID, LEVOTHROID) 50 MCG tablet Take 1 tablet (50 mcg total) by mouth daily.  Marland Kitchen omeprazole (PRILOSEC) 40 MG  capsule Take 1 capsule (40 mg total) by mouth daily.  . pravastatin (PRAVACHOL) 40 MG tablet Take 1 tablet (40 mg total) by mouth daily.  Marland Kitchen senna-docusate (SENOKOT-S) 8.6-50 MG tablet Take 2 tablets by mouth at bedtime.  . sucralfate (CARAFATE) 1 G tablet TAKE 1 TABLET FOUR TIMES DAILY  . tamoxifen (NOLVADEX) 20 MG tablet Take 1 tablet (20 mg total) by mouth daily.  Marland Kitchen triamterene-hydrochlorothiazide (MAXZIDE-25) 37.5-25 MG tablet Take 1 tablet by mouth daily.  Marland Kitchen trimethoprim (TRIMPEX) 100 MG tablet Take 100 mg by mouth daily.  . [DISCONTINUED] HYDROcodone-acetaminophen (NORCO/VICODIN) 5-325 MG tablet TAKE 1 TABLET EVERY TWELVE HOURS AS NEEDED   No facility-administered encounter medications on file as of 05/25/2017.           Objective:   Physical Exam  Constitutional: She is oriented to person, place, and time. She appears well-developed and well-nourished.  HENT:  Head: Normocephalic and atraumatic.  Cardiovascular: Normal rate, regular rhythm and normal heart sounds.   Pulmonary/Chest: Effort normal and breath sounds normal.  Neurological: She is alert and oriented to person, place, and time.  Skin: Skin is warm and dry.  Psychiatric: She has a normal mood and affect. Her behavior is normal.        Assessment & Plan:  Chronic pain management/chronic neck and low back pain- Recent prescription was filled about a week and a half ago so I did go ahead and give her a new prescription today that is dated for October. Website to verify the [facility] is unavailable to review today. Urine drug screen performed. Hyperlipidemia-due to recheck lipid panel.  Elevated BP without diagnosis of hypertension-  blood pressure mildly elevated today. Pressure looks fantastic back in August so at this point we'll just continue to monitor. She's been a little stressed this week.  Hyperlipidemia - continue pravastatin. Due for lipids and CMP.  Will call for eye  Exam from [facility]

## 2017-05-31 DIAGNOSIS — J3089 Other allergic rhinitis: Secondary | ICD-10-CM | POA: Diagnosis not present

## 2017-05-31 LAB — PAIN MGMT, PROFILE 1 W/O CONF, U
Amphetamines: NEGATIVE ng/mL (ref <500)
BENZODIAZEPINES: NEGATIVE ng/mL (ref <100)
Barbiturates: NEGATIVE ng/mL (ref <300)
COCAINE METABOLITE: NEGATIVE ng/mL (ref <150)
Creatinine: 27.7 mg/dL
MARIJUANA METABOLITE: NEGATIVE ng/mL (ref <20)
Methadone Metabolite: NEGATIVE ng/mL (ref <100)
OPIATES: NEGATIVE ng/mL (ref <100)
OXYCODONE: NEGATIVE ng/mL (ref <100)
Oxidant: NEGATIVE ug/mL (ref <200)
Phencyclidine: NEGATIVE ng/mL (ref <25)
pH: 6.4 (ref 4.5–9.0)

## 2017-05-31 LAB — TEST AUTHORIZATION

## 2017-05-31 LAB — PAIN MGMT, OPIATES W/CONF, U: OPIATES: NEGATIVE ng/mL (ref <100)

## 2017-06-20 DIAGNOSIS — J3089 Other allergic rhinitis: Secondary | ICD-10-CM | POA: Diagnosis not present

## 2017-06-29 ENCOUNTER — Other Ambulatory Visit: Payer: Self-pay | Admitting: Family Medicine

## 2017-06-29 ENCOUNTER — Other Ambulatory Visit: Payer: Self-pay | Admitting: *Deleted

## 2017-06-29 MED ORDER — LEVOTHYROXINE SODIUM 50 MCG PO TABS: 50.0000 ug | ORAL_TABLET | Freq: Every day | ORAL | 1 refills | Status: DC

## 2017-07-03 DIAGNOSIS — R69 Illness, unspecified: Secondary | ICD-10-CM | POA: Diagnosis not present

## 2017-07-06 ENCOUNTER — Other Ambulatory Visit: Payer: Self-pay | Admitting: Nurse Practitioner

## 2017-07-08 ENCOUNTER — Other Ambulatory Visit: Payer: Self-pay | Admitting: Nurse Practitioner

## 2017-07-11 DIAGNOSIS — J3089 Other allergic rhinitis: Secondary | ICD-10-CM | POA: Diagnosis not present

## 2017-07-13 DIAGNOSIS — R69 Illness, unspecified: Secondary | ICD-10-CM | POA: Diagnosis not present

## 2017-07-13 DIAGNOSIS — Z79899 Other long term (current) drug therapy: Secondary | ICD-10-CM | POA: Diagnosis not present

## 2017-07-13 DIAGNOSIS — G500 Trigeminal neuralgia: Secondary | ICD-10-CM | POA: Diagnosis not present

## 2017-07-13 DIAGNOSIS — G501 Atypical facial pain: Secondary | ICD-10-CM | POA: Diagnosis not present

## 2017-07-25 ENCOUNTER — Other Ambulatory Visit: Payer: Self-pay

## 2017-07-25 MED ORDER — HYDROCODONE-ACETAMINOPHEN 5-325 MG PO TABS: ORAL_TABLET | ORAL | 0 refills | Status: DC

## 2017-07-25 NOTE — Telephone Encounter (Signed)
[name] is calling for a refill on Hydrocodone. Last refill was 06/07/2017. She has a 3 month visit scheduled for December.

## 2017-08-10 DIAGNOSIS — J3089 Other allergic rhinitis: Secondary | ICD-10-CM | POA: Diagnosis not present

## 2017-08-10 DIAGNOSIS — J329 Chronic sinusitis, unspecified: Secondary | ICD-10-CM | POA: Diagnosis not present

## 2017-08-12 DIAGNOSIS — J3089 Other allergic rhinitis: Secondary | ICD-10-CM | POA: Diagnosis not present

## 2017-08-15 DIAGNOSIS — N3020 Other chronic cystitis without hematuria: Secondary | ICD-10-CM | POA: Diagnosis not present

## 2017-08-15 DIAGNOSIS — R300 Dysuria: Secondary | ICD-10-CM | POA: Diagnosis not present

## 2017-08-16 DIAGNOSIS — J3089 Other allergic rhinitis: Secondary | ICD-10-CM | POA: Diagnosis not present

## 2017-08-24 DIAGNOSIS — J3089 Other allergic rhinitis: Secondary | ICD-10-CM | POA: Diagnosis not present

## 2017-08-30 DIAGNOSIS — J3089 Other allergic rhinitis: Secondary | ICD-10-CM | POA: Diagnosis not present

## 2017-08-31 ENCOUNTER — Encounter: Payer: Self-pay | Admitting: Family Medicine

## 2017-08-31 ENCOUNTER — Ambulatory Visit (INDEPENDENT_AMBULATORY_CARE_PROVIDER_SITE_OTHER): Payer: Medicare HMO | Admitting: Family Medicine

## 2017-08-31 VITALS — BP 138/66 | HR 108 | Ht 66.0 in | Wt 130.0 lb

## 2017-08-31 DIAGNOSIS — G44209 Tension-type headache, unspecified, not intractable: Secondary | ICD-10-CM

## 2017-08-31 DIAGNOSIS — M542 Cervicalgia: Secondary | ICD-10-CM | POA: Diagnosis not present

## 2017-08-31 DIAGNOSIS — M810 Age-related osteoporosis without current pathological fracture: Secondary | ICD-10-CM | POA: Diagnosis not present

## 2017-08-31 DIAGNOSIS — M5136 Other intervertebral disc degeneration, lumbar region: Secondary | ICD-10-CM

## 2017-08-31 DIAGNOSIS — M5030 Other cervical disc degeneration, unspecified cervical region: Secondary | ICD-10-CM

## 2017-08-31 DIAGNOSIS — G500 Trigeminal neuralgia: Secondary | ICD-10-CM

## 2017-08-31 MED ORDER — HYDROCODONE-ACETAMINOPHEN 5-325 MG PO TABS: ORAL_TABLET | ORAL | 0 refills | Status: DC

## 2017-08-31 NOTE — Progress Notes (Signed)
Subjective:    Patient ID: [name], female    DOB: [birth year], 72 y.o.   MRN: [id number]  HPI 72 year old female comes in today to follow-up for chronic pain management. He currently takes hydrocodone 5/325 every 12 hours as needed. She currently has a diagnosis of chronic neck and low back pain with degenerative disc disease in both locations. She has not had any significant changes in pain or discomfort.  She rates her pain currently a 3 out of 10.  At one point she was stretching every night but says that really has kind of fallen off and doing that regularly.  She denies any constipation with the medication.  And says she took her last pill yesterday.  She also complains of a headache just above both eyebrows for about 1 week.  She is concerned that she could have a sinus infection.  She has had a lot of pressure and discomfort she is using Tylenol which does help temporarily.  No ear pain or sore throat.  No fevers chills or sweats.  She has been a little bit more tired lately but has also been very busy with the holidays.  She does use her Flonase daily.  Review of Systems  BP 138/66   Pulse (!) 108   Ht 5\' 6"  (1.676 m)   Wt 130 lb (59 kg)   SpO2 96%   BMI 20.98 kg/m     Allergies  Allergen Reactions  . Simvastatin Other (See Comments)    Aches and memory loss  . Doxycycline Itching  . Other Rash  . Trazodone And Nefazodone Other (See Comments)    Memory impairment and palpitations.   . Ampicillin Rash  . Cefadroxil Rash  . Dicloxacillin Rash    Past Medical History:  Diagnosis Date  . Breast cancer ([place]) 1999   T1N0 LEFT BREAST  . 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 Degenerative disc disease   . History of bladder infections    RECURRENT  . Hyperlipidemia   . Osteoporosis 03-04-2011    LAST BONE DENSITY 03-04-2011  . Recurrent sinusitis   . Thyroid disease   . Trigeminal neuralgia     Past Surgical  History:  Procedure Laterality Date  . ABDOMINAL HYSTERECTOMY  2005   Complete  . BLADDER SUSPENSION  2005  . BREAST SURGERY  1999   Lumpectomy L Br  . BREAST SURGERY  2007   Lymph Node Removal L Chest Wall  . cancerous LN removed  2008  . left breast lumpectomy  1999    Social History   Socioeconomic History  . Marital status: Married    Spouse name: [name]   . Number of children: 3  . Years of education: Therapist, sports  . Highest education level: Not on file  Social Needs  . Financial resource strain: Not on file  . Food insecurity - worry: Not on file  . Food insecurity - inability: Not on file  . Transportation needs - medical: Not on file  . Transportation needs - non-medical: Not on file  Occupational History    Comment: Retired- Therapist, sports  Tobacco Use  . Smoking status: Never Smoker  . Smokeless tobacco: Never Used  Substance and Sexual Activity  . Alcohol use: No    Alcohol/week: 0.0 oz  . Drug use: No  . Sexual activity: Not on file    Comment: housewife, married, 3 kids.  Other Topics Concern  .  Not on file  Social History Narrative   Patient lives at home with her husband [name])   Retired - RN   Right handed.   Caffeine two cups coffee daily and one sweet tea.    Family History  Problem Relation Age of Onset  . Cancer Mother        breast  . Stroke Mother   . Hyperlipidemia Mother   . Cancer Father        bladder  . Colon cancer Father   . Cancer Paternal Grandmother        Mouth  . Cancer Son        Breast  . Cancer Maternal Aunt        Breast  . Stomach cancer Paternal Uncle   . Stomach cancer Paternal Uncle     Outpatient Encounter Medications as of 08/31/2017  Medication Sig  . Calcium-Magnesium-Vitamin D (CALCIUM 1200+D3 PO) Take by mouth daily.   . carbamazepine (TEGRETOL XR) 100 MG 12 hr tablet Take 1 tablet (100 mg total) by mouth 2 (two) times daily.  Marland Kitchen denosumab (PROLIA) 60 MG/ML SOLN injection Inject 60 mg into the skin every 6 (six) months.  Administer in upper arm, thigh, or abdomen  . EPIPEN 2-PAK 0.3 MG/0.3ML SOAJ injection Reported on 02/01/2016  . fish oil-omega-3 fatty acids 1000 MG capsule Take 2 g by mouth daily.    . fluticasone (FLONASE) 50 MCG/ACT nasal spray Place 1 spray into both nostrils daily.  Marland Kitchen HYDROcodone-acetaminophen (NORCO/VICODIN) 5-325 MG tablet TAKE 1 TABLET EVERY TWELVE HOURS AS NEEDED  . levocetirizine (XYZAL) 5 MG tablet Take 5 mg by mouth daily.    Marland Kitchen levothyroxine (SYNTHROID, LEVOTHROID) 50 MCG tablet Take 1 tablet (50 mcg total) by mouth daily.  Marland Kitchen omeprazole (PRILOSEC) 40 MG capsule Take 1 capsule (40 mg total) by mouth daily.  . pravastatin (PRAVACHOL) 40 MG tablet Take 1 tablet (40 mg total) by mouth daily.  Marland Kitchen senna-docusate (SENOKOT-S) 8.6-50 MG tablet Take 2 tablets by mouth at bedtime.  . sucralfate (CARAFATE) 1 G tablet TAKE 1 TABLET FOUR TIMES DAILY  . tamoxifen (NOLVADEX) 20 MG tablet Take 1 tablet (20 mg total) by mouth daily.  Marland Kitchen triamterene-hydrochlorothiazide (MAXZIDE-25) 37.5-25 MG tablet Take 1 tablet by mouth daily.  Marland Kitchen trimethoprim (TRIMPEX) 100 MG tablet Take 100 mg by mouth daily.   No facility-administered encounter medications on file as of 08/31/2017.          Objective:   Physical Exam  Constitutional: She is oriented to person, place, and time. She appears well-developed and well-nourished.  HENT:  Head: Normocephalic and atraumatic.  Right Ear: External ear normal.  Left Ear: External ear normal.  Nose: Nose normal.  Mouth/Throat: Oropharynx is clear and moist.  TMs and canals are clear.   Eyes: Conjunctivae and EOM are normal. Pupils are equal, round, and reactive to light.  Neck: Neck supple. No thyromegaly present.  Cardiovascular: Normal rate, regular rhythm and normal heart sounds.  Pulmonary/Chest: Effort normal and breath sounds normal. She has no wheezes.  Lymphadenopathy:    She has no cervical adenopathy.  Neurological: She is alert and oriented to person,  place, and time.  Skin: Skin is warm and dry.  Psychiatric: She has a normal mood and affect. Her behavior is normal.        Assessment & Plan:  Encounter for chronic pain management/chronic neck and back pain-currently on hydrocodone 5/325.  Last filled per the [facility] registry was July 25, 2017.  And she seems to be feeling consistently with only a single provider.  60 tabs usually last her anywhere from 4-6 weeks.  Urine drug screen was negative she did take a tab yesterday point we will repeat drug screen today for opioids.  She does not take her medication every day.  Headache-unclear etiology.  I really do not think this is related to a true sinus infection.  She is using her Flonase which should be helping to control any allergic component.  Just encouraged her to give it through the weekend and see if it resolves on its own.  If not please let me know.  Tylenol does provide relief which is helpful.  No other red flag symptoms.

## 2017-09-06 DIAGNOSIS — J3089 Other allergic rhinitis: Secondary | ICD-10-CM | POA: Diagnosis not present

## 2017-09-12 DIAGNOSIS — H401131 Primary open-angle glaucoma, bilateral, mild stage: Secondary | ICD-10-CM | POA: Diagnosis not present

## 2017-09-16 LAB — TEST AUTHORIZATION

## 2017-09-16 LAB — OPIATE CONFIRMATION, GC/MS,URINE

## 2017-09-16 LAB — PAIN MGMT, OPIATES W/CONF, U: Opiates: NEGATIVE ng/mL (ref <100)

## 2017-09-20 DIAGNOSIS — J3089 Other allergic rhinitis: Secondary | ICD-10-CM | POA: Diagnosis not present

## 2017-09-27 DIAGNOSIS — J3089 Other allergic rhinitis: Secondary | ICD-10-CM | POA: Diagnosis not present

## 2017-10-03 DIAGNOSIS — H9041 Sensorineural hearing loss, unilateral, right ear, with unrestricted hearing on the contralateral side: Secondary | ICD-10-CM | POA: Diagnosis not present

## 2017-10-03 DIAGNOSIS — H8101 Meniere's disease, right ear: Secondary | ICD-10-CM | POA: Diagnosis not present

## 2017-10-09 ENCOUNTER — Other Ambulatory Visit: Payer: Self-pay

## 2017-10-09 DIAGNOSIS — M810 Age-related osteoporosis without current pathological fracture: Secondary | ICD-10-CM

## 2017-10-09 DIAGNOSIS — G500 Trigeminal neuralgia: Secondary | ICD-10-CM

## 2017-10-09 DIAGNOSIS — M5136 Other intervertebral disc degeneration, lumbar region: Secondary | ICD-10-CM

## 2017-10-09 DIAGNOSIS — M542 Cervicalgia: Secondary | ICD-10-CM

## 2017-10-09 DIAGNOSIS — M51369 Other intervertebral disc degeneration, lumbar region without mention of lumbar back pain or lower extremity pain: Secondary | ICD-10-CM

## 2017-10-09 MED ORDER — HYDROCODONE-ACETAMINOPHEN 5-325 MG PO TABS: ORAL_TABLET | ORAL | 0 refills | Status: DC

## 2017-10-10 ENCOUNTER — Encounter: Payer: Self-pay | Admitting: Physician Assistant

## 2017-10-10 ENCOUNTER — Ambulatory Visit (INDEPENDENT_AMBULATORY_CARE_PROVIDER_SITE_OTHER): Payer: Medicare HMO | Admitting: Physician Assistant

## 2017-10-10 VITALS — BP 154/87 | HR 113 | Temp 97.6°F | Ht 66.0 in | Wt 131.0 lb

## 2017-10-10 DIAGNOSIS — J0111 Acute recurrent frontal sinusitis: Secondary | ICD-10-CM | POA: Diagnosis not present

## 2017-10-10 MED ORDER — LEVOFLOXACIN 500 MG PO TABS: 500.0000 mg | ORAL_TABLET | Freq: Every day | ORAL | 0 refills | Status: DC

## 2017-10-10 NOTE — Progress Notes (Signed)
Subjective:    Patient ID: [name], female    DOB: August 28, 1946, 72 y.o.   MRN: [id number]  HPI  Pt is a 72 yo female with hx of recurrent sinusitis who presents to the clinic with frontal pressure and headache for over a week. She has had some mild UR symptoms for about 1 week. She denies any fever, chills, body aches. She has taken mucinex d, claritin with little relief. She has some nasal congestion as well. Denies any ear pain, cough, fever, chills, SOB, wheezing.   .. Active Ambulatory Problems    Diagnosis Date Noted  . HYPERLIPIDEMIA, MIXED 12/20/2007  . NEURALGIA, TRIGEMINAL 12/20/2007  . UNSPECIFIED GLAUCOMA 12/20/2007  . ALLERGIC CONJUNCTIVITIS 11/27/2009  . ALLERGIC RHINITIS 09/26/2008  . GERD 10/07/2009  . Degenerative disc disease, lumbar 12/20/2007  . NECK PAIN 05/21/2009  . Osteoporosis 12/20/2007  . VERTIGO 07/24/2009  . Breast carcinoma, left.  Original resection 1999. 01/28/2011  . Unspecified hypothyroidism 11/13/2013  . Recurrent sinusitis   . Breast cancer ([place])   . Cancer ([place]) 03/29/2006  . History of bladder infections   . Thyroid disease   . 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 Nipple soreness 11/16/2014  . Allergy to environmental factors 05/26/2015  . High risk medication use 05/26/2015  . Degeneration of cervical intervertebral disc 05/25/2016  . Encounter for chronic pain management 11/22/2016   Resolved Ambulatory Problems    Diagnosis Date Noted  . HYPERLIPIDEMIA 03/01/2010  . BREAST PAIN 07/12/2010  . SINUSITIS - ACUTE-NOS 09/23/2010  . MASTITIS 11/05/2010  . Trigeminal neuralgia pain 02/08/2011  . Lumbosacral strain 03/09/2015  . Tachycardia 03/09/2015  . Need for prophylactic vaccination and inoculation against influenza 05/26/2015  . Gastritis and gastroduodenitis 06/24/2015   Past Medical History:  Diagnosis Date  . Breast cancer ([place]) 1999  .  Cancer ([place]) 03-2006  . Degenerative disc disease   . History of bladder infections   . Hyperlipidemia   . Osteoporosis 03-04-2011  . Recurrent sinusitis   . Thyroid disease   . Trigeminal neuralgia       Review of Systems  All other systems reviewed and are negative.      Objective:   Physical Exam  Constitutional: She is oriented to person, place, and time. She appears well-developed and well-nourished.  HENT:  Head: Normocephalic and atraumatic.  Nose: Nose normal.  Mouth/Throat: Oropharynx is clear and moist. No oropharyngeal exudate.  TM"s erythematous  Tenderness over frontal sinuses to palption.    Eyes: Conjunctivae are normal.  Neck: Normal range of motion. Neck supple.  Cardiovascular: Normal rate, regular rhythm and normal heart sounds.  Pulmonary/Chest: Effort normal and breath sounds normal. She has no wheezes.  Lymphadenopathy:    She has no cervical adenopathy.  Neurological: She is alert and oriented to person, place, and time.  Psychiatric: She has a normal mood and affect. Her behavior is normal.          Assessment & Plan:  Marland KitchenMarland KitchenDiagnoses and all orders for this visit:  Acute recurrent frontal sinusitis -     levofloxacin (LEVAQUIN) 500 MG tablet; Take 1 tablet (500 mg total) by mouth daily. For 7 days.   Based on current symptoms and past symptoms treated with levaquin. Discussed this is not our first line medication for sinusitis. She is allergic to PCN/cephlasporins and in the past zpak has not worked well for her. Symptomatic care discussed.  HO given. Follow up as needed. In the future could conisder ENT referral.

## 2017-10-10 NOTE — Patient Instructions (Signed)

## 2017-10-12 DIAGNOSIS — J3089 Other allergic rhinitis: Secondary | ICD-10-CM | POA: Diagnosis not present

## 2017-10-19 ENCOUNTER — Encounter: Payer: Self-pay | Admitting: Sports Medicine

## 2017-10-19 ENCOUNTER — Ambulatory Visit (INDEPENDENT_AMBULATORY_CARE_PROVIDER_SITE_OTHER): Payer: Medicare HMO | Admitting: Sports Medicine

## 2017-10-19 DIAGNOSIS — M1811 Unilateral primary osteoarthritis of first carpometacarpal joint, right hand: Secondary | ICD-10-CM | POA: Diagnosis not present

## 2017-10-19 NOTE — Assessment & Plan Note (Signed)
I think the fall was simply a red herring. She does have [place] arthritis, a bone spur, she has no tenderness at this joint, no bruising, no swelling, she has no loss of function. No further intervention needed, no imaging needed. Return as needed.

## 2017-10-19 NOTE — Progress Notes (Signed)
Subjective:    I'm seeing this patient as a consultation for: Dr. Beatrice Lecher  CC: Lump on thumb  HPI: Couple of weeks ago this pleasant 72 year old female slipped in a bouncy ball, she fell onto an outstretched right hand.  She had no pain, swelling, bruising, no loss of function, the more recently she woke up and noticed a lump on the radial aspect, somewhat dorsally over the base of the first metacarpal.  It is nontender, she continues to have no swelling, no loss of function.  Just curious as to what this may be.  I reviewed the past medical history, family history, social history, surgical history, and allergies today and no changes were needed.  Please see the problem list section below in epic for further details.  Past Medical History: Past Medical History:  Diagnosis Date  . Breast cancer ([place]) 1999   T1N0 LEFT BREAST  . 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 Degenerative disc disease   . History of bladder infections    RECURRENT  . Hyperlipidemia   . Osteoporosis 03-04-2011    LAST BONE DENSITY 03-04-2011  . Recurrent sinusitis   . Thyroid disease   . Trigeminal neuralgia    Past Surgical History: Past Surgical History:  Procedure Laterality Date  . ABDOMINAL HYSTERECTOMY  2005   Complete  . BLADDER SUSPENSION  2005  . BREAST SURGERY  1999   Lumpectomy L Br  . BREAST SURGERY  2007   Lymph Node Removal L Chest Wall  . cancerous LN removed  2008  . left breast lumpectomy  1999   Social History: Social History   Socioeconomic History  . Marital status: Married    Spouse name: [name]   . Number of children: 3  . Years of education: Therapist, sports  . Highest education level: None  Social Needs  . Financial resource strain: None  . Food insecurity - worry: None  . Food insecurity - inability: None  . Transportation needs - medical: None  . Transportation needs - non-medical: None  Occupational History   Comment: RetiredCommunity education officer  Tobacco Use  . Smoking status: Never Smoker  . Smokeless tobacco: Never Used  Substance and Sexual Activity  . Alcohol use: No    Alcohol/week: 0.0 oz  . Drug use: No  . Sexual activity: None    Comment: housewife, married, 3 kids.  Other Topics Concern  . None  Social History Narrative   Patient lives at home with her husband [name])   Retired - RN   Right handed.   Caffeine two cups coffee daily and one sweet tea.   Family History: Family History  Problem Relation Age of Onset  . Cancer Mother        breast  . Stroke Mother   . Hyperlipidemia Mother   . Cancer Father        bladder  . Colon cancer Father   . Cancer Paternal Grandmother        Mouth  . Cancer Son        Breast  . Cancer Maternal Aunt        Breast  . Stomach cancer Paternal Uncle   . Stomach cancer Paternal Uncle    Allergies: Allergies  Allergen Reactions  . Simvastatin Other (See Comments)    Aches and memory loss  . Doxycycline Itching  . Other Rash  . Trazodone And Nefazodone Other (See Comments)  Memory impairment and palpitations.   . Ampicillin Rash  . Cefadroxil Rash  . Dicloxacillin Rash   Medications: See med rec.  Review of Systems: No headache, visual changes, nausea, vomiting, diarrhea, constipation, dizziness, abdominal pain, skin rash, fevers, chills, night sweats, weight loss, swollen lymph nodes, body aches, joint swelling, muscle aches, chest pain, shortness of breath, mood changes, visual or auditory hallucinations.   Objective:   General: Well Developed, well nourished, and in no acute distress.  Neuro:  Extra-ocular muscles intact, able to move all 4 extremities, sensation grossly intact.  Deep tendon reflexes tested were normal. Psych: Alert and oriented, mood congruent with affect. ENT:  Ears and nose appear unremarkable.  Hearing grossly normal. Neck: Unremarkable overall appearance, trachea midline.  No visible thyroid enlargement. Eyes:  Conjunctivae and lids appear unremarkable.  Pupils equal and round. Skin: Warm and dry, no rashes noted.  Cardiovascular: Pulses palpable, no extremity edema. Right wrist: Inspection normal with no visible erythema or swelling. She does have a palpable, hard, nontender osteophyte from the carpometacarpal joint on the right, inspection of the left wrist shows a similar osteophyte, albeit somewhat less obvious. ROM smooth and normal with good flexion and extension and ulnar/radial deviation that is symmetrical with opposite wrist. Palpation is normal over metacarpals, navicular, lunate, and TFCC; tendons without tenderness/ swelling No snuffbox tenderness. No tenderness over [place] of [place]. Strength 5/5 in all directions without pain. Negative tinel's and phalens signs. Negative Finkelstein sign. Negative Watson's test.  Impression and Recommendations:   This case required medical decision making of moderate complexity.  Primary osteoarthritis of first carpometacarpal joint of right hand I think the fall was simply a red herring. She does have [place] arthritis, a bone spur, she has no tenderness at this joint, no bruising, no swelling, she has no loss of function. No further intervention needed, no imaging needed. Return as needed. ___________________________________________ Gwen Her. Dianah Field, M.D., ABFM., CAQSM. Primary Care and [place] Instructor of [place] [employer] [facility] of Medicine

## 2017-10-19 NOTE — Patient Instructions (Signed)

## 2017-11-02 DIAGNOSIS — J3089 Other allergic rhinitis: Secondary | ICD-10-CM | POA: Diagnosis not present

## 2017-11-08 DIAGNOSIS — R69 Illness, unspecified: Secondary | ICD-10-CM | POA: Diagnosis not present

## 2017-11-13 ENCOUNTER — Other Ambulatory Visit: Payer: Self-pay | Admitting: Family Medicine

## 2017-11-13 DIAGNOSIS — M810 Age-related osteoporosis without current pathological fracture: Secondary | ICD-10-CM

## 2017-11-13 DIAGNOSIS — M5136 Other intervertebral disc degeneration, lumbar region: Secondary | ICD-10-CM

## 2017-11-13 DIAGNOSIS — M542 Cervicalgia: Secondary | ICD-10-CM

## 2017-11-13 DIAGNOSIS — G500 Trigeminal neuralgia: Secondary | ICD-10-CM

## 2017-11-14 NOTE — Telephone Encounter (Signed)
Pt looked up in [place]. rx last filled 10/09/17 #60. Sent to pcp for review and signature.[name], [place]

## 2017-11-15 DIAGNOSIS — J3089 Other allergic rhinitis: Secondary | ICD-10-CM | POA: Diagnosis not present

## 2017-11-21 DIAGNOSIS — J3089 Other allergic rhinitis: Secondary | ICD-10-CM | POA: Diagnosis not present

## 2017-11-29 ENCOUNTER — Encounter: Payer: Self-pay | Admitting: Family Medicine

## 2017-11-29 ENCOUNTER — Ambulatory Visit (INDEPENDENT_AMBULATORY_CARE_PROVIDER_SITE_OTHER): Payer: Medicare HMO | Admitting: Family Medicine

## 2017-11-29 ENCOUNTER — Ambulatory Visit (INDEPENDENT_AMBULATORY_CARE_PROVIDER_SITE_OTHER): Payer: Medicare HMO

## 2017-11-29 VITALS — BP 112/73 | HR 101 | Ht 66.0 in | Wt 132.0 lb

## 2017-11-29 DIAGNOSIS — M5030 Other cervical disc degeneration, unspecified cervical region: Secondary | ICD-10-CM | POA: Diagnosis not present

## 2017-11-29 DIAGNOSIS — M189 Osteoarthritis of first carpometacarpal joint, unspecified: Secondary | ICD-10-CM

## 2017-11-29 DIAGNOSIS — E039 Hypothyroidism, unspecified: Secondary | ICD-10-CM

## 2017-11-29 DIAGNOSIS — Z79899 Other long term (current) drug therapy: Secondary | ICD-10-CM

## 2017-11-29 DIAGNOSIS — M79644 Pain in right finger(s): Secondary | ICD-10-CM

## 2017-11-29 DIAGNOSIS — M19041 Primary osteoarthritis, right hand: Secondary | ICD-10-CM | POA: Diagnosis not present

## 2017-11-29 DIAGNOSIS — M5136 Other intervertebral disc degeneration, lumbar region: Secondary | ICD-10-CM | POA: Diagnosis not present

## 2017-11-29 DIAGNOSIS — M19031 Primary osteoarthritis, right wrist: Secondary | ICD-10-CM | POA: Diagnosis not present

## 2017-11-29 DIAGNOSIS — G8929 Other chronic pain: Secondary | ICD-10-CM | POA: Diagnosis not present

## 2017-11-29 DIAGNOSIS — Z13220 Encounter for screening for lipoid disorders: Secondary | ICD-10-CM

## 2017-11-29 LAB — COMPLETE METABOLIC PANEL WITH GFR
AG Ratio: 2.3 (calc) (ref 1.0–2.5)
ALT: 13 U/L (ref 6–29)
AST: 25 U/L (ref 10–35)
Albumin: 4.9 g/dL (ref 3.6–5.1)
Alkaline phosphatase (APISO): 38 U/L (ref 33–130)
BUN/Creatinine Ratio: 23 (calc) — ABNORMAL HIGH (ref 6–22)
BUN: 25 mg/dL (ref 7–25)
CALCIUM: 10.4 mg/dL (ref 8.6–10.4)
CO2: 31 mmol/L (ref 20–32)
CREATININE: 1.11 mg/dL — AB (ref 0.60–0.93)
Chloride: 102 mmol/L (ref 98–110)
GFR, EST AFRICAN AMERICAN: 58 mL/min/{1.73_m2} — AB (ref >=60)
GFR, EST NON AFRICAN AMERICAN: 50 mL/min/{1.73_m2} — AB (ref >=60)
GLUCOSE: 96 mg/dL (ref 65–99)
Globulin: 2.1 g/dL (calc) (ref 1.9–3.7)
Potassium: 4.4 mmol/L (ref 3.5–5.3)
Sodium: 142 mmol/L (ref 135–146)
TOTAL PROTEIN: 7 g/dL (ref 6.1–8.1)
Total Bilirubin: 0.4 mg/dL (ref 0.2–1.2)

## 2017-11-29 LAB — LIPID PANEL
Cholesterol: 276 mg/dL — ABNORMAL HIGH (ref <200)
HDL: 99 mg/dL (ref >50)
LDL CHOLESTEROL (CALC): 142 mg/dL — AB
NON-HDL CHOLESTEROL (CALC): 177 mg/dL — AB (ref <130)
TRIGLYCERIDES: 211 mg/dL — AB (ref <150)
Total CHOL/HDL Ratio: 2.8 (calc) (ref <5.0)

## 2017-11-29 LAB — TSH: TSH: 4.7 m[IU]/L — AB (ref 0.40–4.50)

## 2017-11-29 NOTE — Progress Notes (Signed)
Subjective:    CC: Back pain management.  HPI:  Follow-up for chronic pain management/chronic cervical spine and low back pain.  She is doing well overall.  Today is a good day.  She says it really just flares the more active she is.  Is doing well and denies any side effect such as constipation.  Some days she does not take it at all other days she may take it twice a day.  She also has some pain at the base of her right thumb.  She fell about 5 weeks ago and came in was evaluated.  Since then she has noticed a knot pop up on the bone and is concerned because it still feels sore and tender.  It is a little bit of soreness in her left thumb base but not nearly what she is experiencing on the right.  She is right-handed.  Hypothyroidism-she feels like she is doing well on her current medication regimen with levothyroxine 50 micro grams daily.  No recent skin hair or weight changes.  Past medical history, Surgical history, Family history not pertinant except as noted below, Social history, Allergies, and medications have been entered into the medical record, reviewed, and corrections made.   Review of Systems: No fevers, chills, night sweats, weight loss, chest pain, or shortness of breath.   Objective:    General: Well Developed, well nourished, and in no acute distress.  Neuro: Alert and oriented x3, extra-ocular muscles intact, sensation grossly intact.  HEENT: Normocephalic, atraumatic  Skin: Warm and dry, no rashes. Cardiac: Regular rate and rhythm, no murmurs rubs or gallops, no lower extremity edema.  Respiratory: Clear to auscultation bilaterally. Not using accessory muscles, speaking in full sentences. MSK: On her right hand at the base of the thumb near the wrist joint there is a palpable nodule.  It feels very firm most like bone but could also be a firm cyst.  It is a little bit tender on exam but no significant swelling erythema or bruising.  Normal range of motion of the  thumb.   Impression and Recommendations:    Encounter for chronic pain management/chronic neck and back pain-currently on hydrocodone 5/325.  This typically lasts for just a little more than 4 weeks.  Did have her update her pain contract today as well as complete an opioid risk tool.  We had done it previously but I could not find in the system so we reinjured it today.  Right thumb pain-since she did ask parents a fall about 5 weeks ago and now has a palpable knot at the base near the wrist.  I think we should get an x-ray for further evaluation.  Reminded her to schedule her mammogram.  She is due this month.  Hypothyroidism-last TSH in August look great at 3.7.  Discussed shingles vaccine.  Handout provided.

## 2017-11-30 ENCOUNTER — Other Ambulatory Visit: Payer: Self-pay | Admitting: Family Medicine

## 2017-11-30 DIAGNOSIS — Z1231 Encounter for screening mammogram for malignant neoplasm of breast: Secondary | ICD-10-CM

## 2017-11-30 MED ORDER — ATORVASTATIN CALCIUM 40 MG PO TABS: 40.0000 mg | ORAL_TABLET | Freq: Every day | ORAL | 3 refills | Status: DC

## 2017-11-30 NOTE — Addendum Note (Signed)
Addended by: Beatrice Lecher D on: 11/30/2017 05:14 PM   Modules accepted: Orders

## 2017-12-06 DIAGNOSIS — R350 Frequency of micturition: Secondary | ICD-10-CM | POA: Diagnosis not present

## 2017-12-06 DIAGNOSIS — B373 Candidiasis of vulva and vagina: Secondary | ICD-10-CM | POA: Diagnosis not present

## 2017-12-08 ENCOUNTER — Other Ambulatory Visit: Payer: Self-pay | Admitting: *Deleted

## 2017-12-08 MED ORDER — EPINEPHRINE 0.3 MG/0.3ML IJ SOAJ: INTRAMUSCULAR | 99 refills | Status: DC

## 2017-12-11 ENCOUNTER — Other Ambulatory Visit: Payer: Self-pay

## 2017-12-11 DIAGNOSIS — J3089 Other allergic rhinitis: Secondary | ICD-10-CM | POA: Diagnosis not present

## 2017-12-11 MED ORDER — EPINEPHRINE 0.3 MG/0.3ML IJ SOAJ: INTRAMUSCULAR | 99 refills | Status: DC

## 2017-12-13 ENCOUNTER — Encounter: Payer: Self-pay | Admitting: Family Medicine

## 2017-12-13 ENCOUNTER — Ambulatory Visit (INDEPENDENT_AMBULATORY_CARE_PROVIDER_SITE_OTHER): Payer: Medicare HMO | Admitting: Family Medicine

## 2017-12-13 VITALS — BP 134/74 | HR 112 | Temp 101.6°F | Wt 135.0 lb

## 2017-12-13 DIAGNOSIS — J0100 Acute maxillary sinusitis, unspecified: Secondary | ICD-10-CM

## 2017-12-13 DIAGNOSIS — B078 Other viral warts: Secondary | ICD-10-CM

## 2017-12-13 MED ORDER — AZITHROMYCIN 250 MG PO TABS: ORAL_TABLET | ORAL | 0 refills | Status: AC

## 2017-12-13 NOTE — Patient Instructions (Signed)

## 2017-12-13 NOTE — Progress Notes (Signed)
   Subjective:    Patient ID: [name], female    DOB: 11-24-1945, 72 y.o.   MRN: [id number]  HPI 72 yo female comes in today complaining of cough and fever to 101.6 x 2 days.  Using Tylenol. Having left maxillary sinus pain and swelling.  She is has some congestion but not a lot of thick mucus.  No cough or shortness of breath.  No ear pain.  No sore throat.  She is not been taking any over-the-counter medication.  She also has a lesion on her left inner arm she would like me to look at today.  It is not really bothersome.  Review of Systems     Objective:   Physical Exam  Constitutional: She is oriented to person, place, and time. She appears well-developed and well-nourished.  HENT:  Head: Normocephalic and atraumatic.  Right Ear: External ear normal.  Left Ear: External ear normal.  Nose: Nose normal.  Mouth/Throat: Oropharynx is clear and moist.  TMs and canals are clear.   Eyes: Pupils are equal, round, and reactive to light. Conjunctivae and EOM are normal.  Neck: Neck supple. No thyromegaly present.  Cardiovascular: Normal rate, regular rhythm and normal heart sounds.  Pulmonary/Chest: Effort normal and breath sounds normal. She has no wheezes.  Lymphadenopathy:    She has no cervical adenopathy.  Neurological: She is alert and oriented to person, place, and time.  Skin: Skin is warm and dry.  skin lesion is most consistent with a wart.  It is approximately 1 cm in size and has a raised rough surface.  Psychiatric: She has a normal mood and affect.          Assessment & Plan:  Acute left maxillary sinusitis-we will treat with azithromycin.  If not significantly better in 1 week please let me know.  Recommend a trial of nasal saline irrigation.  Wart-discussed that we can certainly treat with cryotherapy or even if she wants to try an over-the-counter wart remover she certainly can.

## 2017-12-14 ENCOUNTER — Other Ambulatory Visit: Payer: Self-pay

## 2017-12-14 MED ORDER — EPINEPHRINE 0.3 MG/0.3ML IJ SOAJ: INTRAMUSCULAR | 99 refills | Status: DC

## 2017-12-14 NOTE — Telephone Encounter (Signed)
Patient's husband wanted the Epi pen to go to the local pharmacy [facility].

## 2017-12-15 ENCOUNTER — Other Ambulatory Visit: Payer: Self-pay | Admitting: Family Medicine

## 2017-12-20 ENCOUNTER — Ambulatory Visit: Payer: Medicare HMO

## 2017-12-25 ENCOUNTER — Other Ambulatory Visit: Payer: Self-pay | Admitting: Family Medicine

## 2017-12-25 DIAGNOSIS — J3089 Other allergic rhinitis: Secondary | ICD-10-CM | POA: Diagnosis not present

## 2017-12-27 ENCOUNTER — Other Ambulatory Visit: Payer: Self-pay | Admitting: Family Medicine

## 2017-12-27 DIAGNOSIS — M5136 Other intervertebral disc degeneration, lumbar region: Secondary | ICD-10-CM

## 2017-12-27 DIAGNOSIS — M542 Cervicalgia: Secondary | ICD-10-CM

## 2017-12-27 DIAGNOSIS — G500 Trigeminal neuralgia: Secondary | ICD-10-CM

## 2017-12-27 DIAGNOSIS — M810 Age-related osteoporosis without current pathological fracture: Secondary | ICD-10-CM

## 2017-12-27 NOTE — Telephone Encounter (Signed)
[facility] requesting refill on Norco. Last RX written 11-14-17 for #60, 1 tab BID  RX pended, please sent if appropriate

## 2017-12-28 DIAGNOSIS — G501 Atypical facial pain: Secondary | ICD-10-CM | POA: Diagnosis not present

## 2017-12-28 DIAGNOSIS — G500 Trigeminal neuralgia: Secondary | ICD-10-CM | POA: Diagnosis not present

## 2018-01-01 ENCOUNTER — Ambulatory Visit
Admission: RE | Admit: 2018-01-01 | Discharge: 2018-01-01 | Disposition: A | Discharge status: Home / Self Care | Payer: Medicare HMO | Source: Ambulatory Visit | Attending: Family Medicine | Admitting: Family Medicine

## 2018-01-01 DIAGNOSIS — Z1231 Encounter for screening mammogram for malignant neoplasm of breast: Secondary | ICD-10-CM

## 2018-01-04 ENCOUNTER — Other Ambulatory Visit: Payer: Self-pay | Admitting: Adult Health

## 2018-01-08 DIAGNOSIS — R69 Illness, unspecified: Secondary | ICD-10-CM | POA: Diagnosis not present

## 2018-01-09 ENCOUNTER — Other Ambulatory Visit: Payer: Self-pay | Admitting: Adult Health

## 2018-01-15 DIAGNOSIS — J3089 Other allergic rhinitis: Secondary | ICD-10-CM | POA: Diagnosis not present

## 2018-01-16 DIAGNOSIS — R69 Illness, unspecified: Secondary | ICD-10-CM | POA: Diagnosis not present

## 2018-01-24 DIAGNOSIS — J3089 Other allergic rhinitis: Secondary | ICD-10-CM | POA: Diagnosis not present

## 2018-01-29 ENCOUNTER — Ambulatory Visit: Payer: Medicare HMO | Admitting: Family Medicine

## 2018-01-29 ENCOUNTER — Other Ambulatory Visit: Payer: Self-pay | Admitting: Family Medicine

## 2018-01-29 ENCOUNTER — Encounter: Payer: Self-pay | Admitting: Family Medicine

## 2018-01-29 VITALS — BP 131/54 | HR 105 | Ht 66.93 in | Wt 135.0 lb

## 2018-01-29 DIAGNOSIS — J3089 Other allergic rhinitis: Secondary | ICD-10-CM | POA: Diagnosis not present

## 2018-01-29 DIAGNOSIS — E782 Mixed hyperlipidemia: Secondary | ICD-10-CM | POA: Diagnosis not present

## 2018-01-29 DIAGNOSIS — M5030 Other cervical disc degeneration, unspecified cervical region: Secondary | ICD-10-CM

## 2018-01-29 DIAGNOSIS — M5136 Other intervertebral disc degeneration, lumbar region: Secondary | ICD-10-CM

## 2018-01-29 DIAGNOSIS — M542 Cervicalgia: Secondary | ICD-10-CM

## 2018-01-29 DIAGNOSIS — G8929 Other chronic pain: Secondary | ICD-10-CM

## 2018-01-29 DIAGNOSIS — G500 Trigeminal neuralgia: Secondary | ICD-10-CM

## 2018-01-29 DIAGNOSIS — M810 Age-related osteoporosis without current pathological fracture: Secondary | ICD-10-CM | POA: Diagnosis not present

## 2018-01-29 DIAGNOSIS — M51369 Other intervertebral disc degeneration, lumbar region without mention of lumbar back pain or lower extremity pain: Secondary | ICD-10-CM

## 2018-01-29 LAB — COMPLETE METABOLIC PANEL WITH GFR
AG Ratio: 2.3 (calc) (ref 1.0–2.5)
ALT: 12 U/L (ref 6–29)
AST: 21 U/L (ref 10–35)
Albumin: 4.3 g/dL (ref 3.6–5.1)
Alkaline phosphatase (APISO): 36 U/L (ref 33–130)
BILIRUBIN TOTAL: 0.4 mg/dL (ref 0.2–1.2)
BUN: 20 mg/dL (ref 7–25)
CO2: 31 mmol/L (ref 20–32)
Calcium: 10 mg/dL (ref 8.6–10.4)
Chloride: 101 mmol/L (ref 98–110)
Creat: 0.92 mg/dL (ref 0.60–0.93)
GFR, EST NON AFRICAN AMERICAN: 63 mL/min/{1.73_m2} (ref >=60)
GFR, Est African American: 73 mL/min/{1.73_m2} (ref >=60)
GLOBULIN: 1.9 g/dL (ref 1.9–3.7)
Glucose, Bld: 92 mg/dL (ref 65–99)
Potassium: 4.2 mmol/L (ref 3.5–5.3)
SODIUM: 140 mmol/L (ref 135–146)
Total Protein: 6.2 g/dL (ref 6.1–8.1)

## 2018-01-29 LAB — LIPID PANEL
CHOL/HDL RATIO: 2.8 (calc) (ref <5.0)
CHOLESTEROL: 230 mg/dL — AB (ref <200)
HDL: 82 mg/dL (ref >50)
LDL CHOLESTEROL (CALC): 120 mg/dL — AB
NON-HDL CHOLESTEROL (CALC): 148 mg/dL — AB (ref <130)
Triglycerides: 168 mg/dL — ABNORMAL HIGH (ref <150)

## 2018-01-29 MED ORDER — HYDROCODONE-ACETAMINOPHEN 5-325 MG PO TABS: ORAL_TABLET | ORAL | 0 refills | Status: DC

## 2018-01-29 NOTE — Progress Notes (Signed)
Subjective:    Patient ID: [name], female    DOB: 08-Mar-1946, 72 y.o.   MRN: [id number]  HPI  Encounter for chronic pain management/chronic neck and back pain-currently on hydrocodone 5/325.  she takes the medication usually once or twice a day.  She feels like she is worse when she is very active.  She feels like her pain is at baseline.    Hyperlipidemia- tolerating statin well. WE changed to atorvastatin since lipids were high.    Review of Systems  BP (!) 131/54   Pulse (!) 105   Ht 5' 6.93" (1.7 m)   Wt 135 lb (61.2 kg)   SpO2 98%   BMI 21.19 kg/m     Allergies  Allergen Reactions  . Simvastatin Other (See Comments)    Aches and memory loss  . Doxycycline Itching  . Other Rash  . Trazodone And Nefazodone Other (See Comments)    Memory impairment and palpitations.   . Ampicillin Rash  . Cefadroxil Rash  . Dicloxacillin Rash    Past Medical History:  Diagnosis Date  . Breast cancer ([place]) 1999   T1N0 LEFT BREAST  . 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 Degenerative disc disease   . History of bladder infections    RECURRENT  . Hyperlipidemia   . Osteoporosis 03-04-2011    LAST BONE DENSITY 03-04-2011  . Recurrent sinusitis   . Thyroid disease   . Trigeminal neuralgia     Past Surgical History:  Procedure Laterality Date  . ABDOMINAL HYSTERECTOMY  2005   Complete  . BLADDER SUSPENSION  2005  . BREAST LUMPECTOMY Left 1999   malignant  . BREAST SURGERY  1999   Lumpectomy L Br  . BREAST SURGERY  2007   Lymph Node Removal L Chest Wall  . cancerous LN removed  2008  . left breast lumpectomy  1999    Social History   Socioeconomic History  . Marital status: Married    Spouse name: [name]   . Number of children: 3  . Years of education: Therapist, sports  . Highest education level: Not on file  Occupational History    Comment: Retired- Therapist, sports  Social Needs  . Financial resource strain: Not on file  . Food  insecurity:    Worry: Not on file    Inability: Not on file  . Transportation needs:    Medical: Not on file    Non-medical: Not on file  Tobacco Use  . Smoking status: Never Smoker  . Smokeless tobacco: Never Used  Substance and Sexual Activity  . Alcohol use: No    Alcohol/week: 0.0 oz  . Drug use: No  . Sexual activity: Not on file    Comment: housewife, married, 3 kids.  Lifestyle  . Physical activity:    Days per week: Not on file    Minutes per session: Not on file  . Stress: Not on file  Relationships  . Social connections:    Talks on phone: Not on file    Gets together: Not on file    Attends religious service: Not on file    Active member of club or organization: Not on file    Attends meetings of clubs or organizations: Not on file    Relationship status: Not on file  . Intimate partner violence:    Fear of current or ex partner: Not on file    Emotionally abused: Not  on file    Physically abused: Not on file    Forced sexual activity: Not on file  Other Topics Concern  . Not on file  Social History Narrative   Patient lives at home with her husband [name])   Retired - RN   Right handed.   Caffeine two cups coffee daily and one sweet tea.    Family History  Problem Relation Age of Onset  . Cancer Mother        breast  . Stroke Mother   . Hyperlipidemia Mother   . Cancer Father        bladder  . Colon cancer Father   . Cancer Paternal Grandmother        Mouth  . Cancer Son        Breast  . Cancer Maternal Aunt        Breast  . Stomach cancer Paternal Uncle   . Stomach cancer Paternal Uncle     Outpatient Encounter Medications as of 01/29/2018  Medication Sig  . atorvastatin (LIPITOR) 40 MG tablet Take 1 tablet (40 mg total) by mouth at bedtime.  . Calcium-Magnesium-Vitamin D (CALCIUM 1200+D3 PO) Take by mouth daily.   . carbamazepine (TEGRETOL XR) 100 MG 12 hr tablet Take 1 tablet (100 mg total) by mouth 2 (two) times daily.  Marland Kitchen denosumab  (PROLIA) 60 MG/ML SOLN injection Inject 60 mg into the skin every 6 (six) months. Administer in upper arm, thigh, or abdomen  . EPINEPHrine (EPIPEN 2-PAK) 0.3 mg/0.3 mL IJ SOAJ injection Inject 0.3 mls (0.3 mg total) into the muscles AS NEEDED (allergic reaction)  . fish oil-omega-3 fatty acids 1000 MG capsule Take 2 g by mouth daily.    . fluticasone (FLONASE) 50 MCG/ACT nasal spray Place 1 spray into both nostrils daily.  Marland Kitchen HYDROcodone-acetaminophen (NORCO/VICODIN) 5-325 MG tablet TAKE 1 TABLET EVERY TWELVE HOURS AS NEEDED  . latanoprost (XALATAN) 0.005 % ophthalmic solution   . levocetirizine (XYZAL) 5 MG tablet Take 5 mg by mouth daily.    Marland Kitchen levothyroxine (SYNTHROID, LEVOTHROID) 50 MCG tablet Take 1 tablet (50 mcg total) by mouth daily.  . montelukast (SINGULAIR) 10 MG tablet   . omeprazole (PRILOSEC) 40 MG capsule TAKE 1 CAPSULE DAILY  . pravastatin (PRAVACHOL) 40 MG tablet Take 1 tablet (40 mg total) by mouth daily.  Marland Kitchen senna-docusate (SENOKOT-S) 8.6-50 MG tablet Take 2 tablets by mouth at bedtime.  . sucralfate (CARAFATE) 1 G tablet TAKE 1 TABLET FOUR TIMES DAILY  . tamoxifen (NOLVADEX) 20 MG tablet Take 1 tablet (20 mg total) by mouth daily.  Marland Kitchen triamterene-hydrochlorothiazide (MAXZIDE-25) 37.5-25 MG tablet TAKE 1 TABLET DAILY  . trimethoprim (TRIMPEX) 100 MG tablet Take 100 mg by mouth daily.  . [DISCONTINUED] HYDROcodone-acetaminophen (NORCO/VICODIN) 5-325 MG tablet TAKE 1 TABLET EVERY TWELVE HOURS AS NEEDED   No facility-administered encounter medications on file as of 01/29/2018.          Objective:   Physical Exam  Constitutional: She is oriented to person, place, and time. She appears well-developed and well-nourished.  HENT:  Head: Normocephalic and atraumatic.  Cardiovascular: Normal rate, regular rhythm and normal heart sounds.  Pulmonary/Chest: Effort normal and breath sounds normal.  Neurological: She is alert and oriented to person, place, and time.  Skin: Skin is warm  and dry.  Psychiatric: She has a normal mood and affect. Her behavior is normal.       Assessment & Plan:  Encounter for chronic pain management/chronic neck and back  pain-currently on hydrocodone 5/325.  This typically lasts for just a little more than 4 weeks.   Indication for chronic opioid: Chronic neck and back pain  medication and dose: Hydrocodone 5/325. # pills per month: 60 Last UDS date: done today.  Opioid Treatment Agreement signed (Y/N): Y Opioid Treatment Agreement last reviewed with patient: 11/29/2017  9:39 AM [facility] reviewed this encounter (include red flags):    Hyperlipidmia - due to recheck levels on new statin.

## 2018-01-31 ENCOUNTER — Telehealth: Payer: Self-pay | Admitting: Hematology and Oncology

## 2018-01-31 ENCOUNTER — Inpatient Hospital Stay: Payer: Medicare HMO | Attending: Hematology and Oncology | Admitting: Hematology and Oncology

## 2018-01-31 DIAGNOSIS — Z79899 Other long term (current) drug therapy: Secondary | ICD-10-CM | POA: Insufficient documentation

## 2018-01-31 DIAGNOSIS — Z79810 Long term (current) use of selective estrogen receptor modulators (SERMs): Secondary | ICD-10-CM | POA: Diagnosis not present

## 2018-01-31 DIAGNOSIS — M810 Age-related osteoporosis without current pathological fracture: Secondary | ICD-10-CM | POA: Insufficient documentation

## 2018-01-31 DIAGNOSIS — C50912 Malignant neoplasm of unspecified site of left female breast: Secondary | ICD-10-CM | POA: Insufficient documentation

## 2018-01-31 DIAGNOSIS — Z170 Estrogen receptor positive status [ER+]: Secondary | ICD-10-CM | POA: Insufficient documentation

## 2018-01-31 DIAGNOSIS — C50919 Malignant neoplasm of unspecified site of unspecified female breast: Secondary | ICD-10-CM

## 2018-01-31 MED ORDER — TAMOXIFEN CITRATE 20 MG PO TABS: 20.0000 mg | ORAL_TABLET | Freq: Every day | ORAL | 3 refills | Status: AC

## 2018-01-31 NOTE — Telephone Encounter (Signed)
Gave patient AVs and calendar of upcoming June 2020 appointments.

## 2018-01-31 NOTE — Progress Notes (Signed)
Patient Care Team: Hali Marry, MD as PCP - General Gordy Levan, MD as Consulting Physician (Hematology and Oncology) Leta Baptist, MD as Consulting Physician (Otolaryngology) Bjorn Loser, MD as Consulting Physician (Urology)  DIAGNOSIS:  Encounter Diagnoses  Name Primary?  . 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 Malignant neoplasm of female breast, unspecified estrogen receptor status, unspecified laterality, unspecified site of breast ([place])     SUMMARY OF ONCOLOGIC HISTORY:   Breast carcinoma, left.  Original resection 1999.   1999 Initial Diagnosis    Breast carcinoma, left.  Original resection 1999.      2007 Relapse/Recurrence    Recurrence of internal mammary lymph node      2007 -  Anti-estrogen oral therapy    Initially treated with anastrozole and later changed to Tamoxifen 20 mg daily because of osteoporosis, currently on Prolia       CHIEF COMPLIANT: Follow-up on tamoxifen  INTERVAL HISTORY: [name] is a 72 year old with above-mentioned history of left breast cancer currently on antiestrogen therapy with tamoxifen.  She has been on it for the past 12 years.  She reports no side effects or concerns.  Denies any pain lumps or nodules in the breast.  REVIEW OF SYSTEMS:   Constitutional: Denies fevers, chills or abnormal weight loss Eyes: Denies blurriness of vision Ears, nose, mouth, throat, and face: Denies mucositis or sore throat Respiratory: Denies cough, dyspnea or wheezes Cardiovascular: Denies palpitation, chest discomfort Gastrointestinal:  Denies nausea, heartburn or change in bowel habits Skin: Denies abnormal skin rashes Lymphatics: Denies new lymphadenopathy or easy bruising Neurological:Denies numbness, tingling or new weaknesses Behavioral/Psych: Mood is stable, no new changes  Extremities: No lower extremity edema Breast:  denies any pain or lumps or  nodules in either breasts All other systems were reviewed with the patient and are negative.  I have reviewed the past medical history, past surgical history, social history and family history with the patient and they are unchanged from previous note.  ALLERGIES:  is allergic to simvastatin; doxycycline; other; trazodone and nefazodone; ampicillin; cefadroxil; and dicloxacillin.  MEDICATIONS:  Current Outpatient Medications  Medication Sig Dispense Refill  . atorvastatin (LIPITOR) 40 MG tablet Take 1 tablet (40 mg total) by mouth at bedtime. 90 tablet 3  . Calcium-Magnesium-Vitamin D (CALCIUM 1200+D3 PO) Take by mouth daily.     . carbamazepine (TEGRETOL XR) 100 MG 12 hr tablet Take 1 tablet (100 mg total) by mouth 2 (two) times daily. 180 tablet 3  . denosumab (PROLIA) 60 MG/ML SOLN injection Inject 60 mg into the skin every 6 (six) months. Administer in upper arm, thigh, or abdomen    . EPINEPHrine (EPIPEN 2-PAK) 0.3 mg/0.3 mL IJ SOAJ injection Inject 0.3 mls (0.3 mg total) into the muscles AS NEEDED (allergic reaction) 2 Device prn  . fish oil-omega-3 fatty acids 1000 MG capsule Take 2 g by mouth daily.      . fluticasone (FLONASE) 50 MCG/ACT nasal spray Place 1 spray into both nostrils daily.  1  . HYDROcodone-acetaminophen (NORCO/VICODIN) 5-325 MG tablet TAKE 1 TABLET EVERY TWELVE HOURS AS NEEDED 60 tablet 0  . latanoprost (XALATAN) 0.005 % ophthalmic solution     . levocetirizine (XYZAL) 5 MG tablet Take 5 mg by mouth daily.      Marland Kitchen levothyroxine (SYNTHROID, LEVOTHROID) 50 MCG tablet TAKE 1 TABLET DAILY 90 tablet 0  . montelukast (SINGULAIR) 10 MG tablet     .  omeprazole (PRILOSEC) 40 MG capsule TAKE 1 CAPSULE DAILY 90 capsule 3  . pravastatin (PRAVACHOL) 40 MG tablet Take 1 tablet (40 mg total) by mouth daily. 90 tablet 3  . senna-docusate (SENOKOT-S) 8.6-50 MG tablet Take 2 tablets by mouth at bedtime.    . sucralfate (CARAFATE) 1 G tablet TAKE 1 TABLET FOUR TIMES DAILY 120 tablet 0    . tamoxifen (NOLVADEX) 20 MG tablet Take 1 tablet (20 mg total) by mouth daily. 90 tablet 3  . triamterene-hydrochlorothiazide (MAXZIDE-25) 37.5-25 MG tablet TAKE 1 TABLET DAILY 90 tablet 3  . trimethoprim (TRIMPEX) 100 MG tablet Take 100 mg by mouth daily.     No current facility-administered medications for this visit.     PHYSICAL EXAMINATION: ECOG PERFORMANCE STATUS: 1 - Symptomatic but completely ambulatory  Vitals:   01/31/18 1049  BP: (!) 142/71  Pulse: 87  Resp: 18  Temp: 98.7 F (37.1 C)  SpO2: 97%   Filed Weights   01/31/18 1049  Weight: 135 lb 11.2 oz (61.6 kg)    GENERAL:alert, no distress and comfortable SKIN: skin color, texture, turgor are normal, no rashes or significant lesions EYES: normal, Conjunctiva are pink and non-injected, sclera clear OROPHARYNX:no exudate, no erythema and lips, buccal mucosa, and tongue normal  NECK: supple, thyroid normal size, non-tender, without nodularity LYMPH:  no palpable lymphadenopathy in the cervical, axillary or inguinal LUNGS: clear to auscultation and percussion with normal breathing effort HEART: regular rate & rhythm and no murmurs and no lower extremity edema ABDOMEN:abdomen soft, non-tender and normal bowel sounds MUSCULOSKELETAL:no cyanosis of digits and no clubbing  NEURO: alert & oriented x 3 with fluent speech, no focal motor/sensory deficits EXTREMITIES: No lower extremity edema BREAST scar tissue from prior surgery, no palpable lumps or nodules (exam performed in the presence of a chaperone)  LABORATORY DATA:  I have reviewed the data as listed CMP Latest Ref Rng & Units 01/29/2018 11/29/2017 04/04/2017  Glucose 65 - 99 mg/dL 92 96 92  BUN 7 - 25 mg/dL 20 25 22   Creatinine 0.60 - 0.93 mg/dL 0.92 1.11(H) 1.08(H)  Sodium 135 - 146 mmol/L 140 142 141  Potassium 3.5 - 5.3 mmol/L 4.2 4.4 4.0  Chloride 98 - 110 mmol/L 101 102 100  CO2 20 - 32 mmol/L 31 31 30   Calcium 8.6 - 10.4 mg/dL 10.0 10.4 10.3  Total Protein  6.1 - 8.1 g/dL 6.2 7.0 6.6  Total Bilirubin 0.2 - 1.2 mg/dL 0.4 0.4 0.3  Alkaline Phos 33 - 130 U/L - - 29(L)  AST 10 - 35 U/L 21 25 22   ALT 6 - 29 U/L 12 13 12     Lab Results  Component Value Date   WBC 3.9 04/04/2017   HGB 12.3 04/04/2017   HCT 37.2 04/04/2017   MCV 93.9 04/04/2017   PLT 207 04/04/2017   NEUTROABS 2,223 04/04/2017    ASSESSMENT & PLAN:  Breast carcinoma, left.  Original resection 1999. Left breast carcinoma stage 1 1999 then recurrence in internal mammary node in 2007: treated then with chemo and resection of the involved node, then hormone blockers ongoing.Aromatase inhibitor changed to tamoxifen with osteoporosis and arhtralgias.   Current treatment: Tamoxifen 20 mg daily Tamoxifen toxicities: Osteoporosis: Currently on Prolia along with calcium and vitamin D and weightbearing exercises  Duration of antiestrogen therapy: Patient wishes to remain on tamoxifen as long as possible. With her previous history of internal mammary lymph node, we are nervous to stop antiestrogen therapy.  However if  she has any adverse effects we would need to stop tamoxifen at that time.  Surveillance: 1. Breast exam 01/31/2018: Benign 2. mammogram  May 2019: No mammographic evidence of malignancy   Return to clinic in 1 year for follow-up    No orders of the defined types were placed in this encounter.  The patient has a good understanding of the overall plan. she agrees with it. she will call with any problems that may develop before the next visit here.   Harriette Ohara, MD 01/31/18

## 2018-01-31 NOTE — Assessment & Plan Note (Signed)
Left breast carcinoma stage 1 1999 then recurrence in internal mammary node in 2007: treated then with chemo and resection of the involved node, then hormone blockers ongoing.Aromatase inhibitor changed to tamoxifen with osteoporosis and arhtralgias.   Current treatment: Tamoxifen 20 mg daily Tamoxifen toxicities: Osteoporosis: Currently on Prolia along with calcium and vitamin D and weightbearing exercises  Duration of antiestrogen therapy: Patient wishes to remain on tamoxifen as long as possible.  Surveillance: 1. Breast exam 01/31/2018: Benign 2. mammogram May 2019: No mammographic evidence of malignancy   Return to clinic in 1 year for follow-up

## 2018-02-05 DIAGNOSIS — J3089 Other allergic rhinitis: Secondary | ICD-10-CM | POA: Diagnosis not present

## 2018-02-05 DIAGNOSIS — J329 Chronic sinusitis, unspecified: Secondary | ICD-10-CM | POA: Diagnosis not present

## 2018-02-22 DIAGNOSIS — D485 Neoplasm of uncertain behavior of skin: Secondary | ICD-10-CM | POA: Diagnosis not present

## 2018-02-22 DIAGNOSIS — R350 Frequency of micturition: Secondary | ICD-10-CM | POA: Diagnosis not present

## 2018-02-22 DIAGNOSIS — L570 Actinic keratosis: Secondary | ICD-10-CM | POA: Diagnosis not present

## 2018-02-22 DIAGNOSIS — C44722 Squamous cell carcinoma of skin of right lower limb, including hip: Secondary | ICD-10-CM | POA: Diagnosis not present

## 2018-02-22 MED FILL — NITROFURANTOIN MONO-MCR 100: 100 | 7 days supply | Qty: 14 | Fill #0

## 2018-02-27 DIAGNOSIS — J3089 Other allergic rhinitis: Secondary | ICD-10-CM | POA: Diagnosis not present

## 2018-03-06 ENCOUNTER — Other Ambulatory Visit: Payer: Self-pay

## 2018-03-06 DIAGNOSIS — M810 Age-related osteoporosis without current pathological fracture: Secondary | ICD-10-CM

## 2018-03-06 DIAGNOSIS — M542 Cervicalgia: Secondary | ICD-10-CM

## 2018-03-06 DIAGNOSIS — G500 Trigeminal neuralgia: Secondary | ICD-10-CM

## 2018-03-06 DIAGNOSIS — M5136 Other intervertebral disc degeneration, lumbar region: Secondary | ICD-10-CM

## 2018-03-06 MED ORDER — HYDROCODONE-ACETAMINOPHEN 5-325 MG PO TABS: ORAL_TABLET | ORAL | 0 refills | Status: DC

## 2018-03-06 NOTE — Telephone Encounter (Signed)
[name] needs a refill on Hydrocodone. [name] can't e-prescribe at the moment.

## 2018-03-19 DIAGNOSIS — J3089 Other allergic rhinitis: Secondary | ICD-10-CM | POA: Diagnosis not present

## 2018-03-27 DIAGNOSIS — J3089 Other allergic rhinitis: Secondary | ICD-10-CM | POA: Diagnosis not present

## 2018-04-02 ENCOUNTER — Encounter: Payer: Self-pay | Admitting: Family Medicine

## 2018-04-02 ENCOUNTER — Ambulatory Visit (INDEPENDENT_AMBULATORY_CARE_PROVIDER_SITE_OTHER): Payer: Medicare HMO | Admitting: Family Medicine

## 2018-04-02 VITALS — BP 139/69 | HR 90 | Ht 67.0 in | Wt 138.0 lb

## 2018-04-02 DIAGNOSIS — E782 Mixed hyperlipidemia: Secondary | ICD-10-CM

## 2018-04-02 DIAGNOSIS — M542 Cervicalgia: Secondary | ICD-10-CM

## 2018-04-02 DIAGNOSIS — G500 Trigeminal neuralgia: Secondary | ICD-10-CM

## 2018-04-02 DIAGNOSIS — M810 Age-related osteoporosis without current pathological fracture: Secondary | ICD-10-CM | POA: Diagnosis not present

## 2018-04-02 DIAGNOSIS — M51369 Other intervertebral disc degeneration, lumbar region without mention of lumbar back pain or lower extremity pain: Secondary | ICD-10-CM

## 2018-04-02 DIAGNOSIS — M5136 Other intervertebral disc degeneration, lumbar region: Secondary | ICD-10-CM | POA: Diagnosis not present

## 2018-04-02 MED ORDER — HYDROCODONE-ACETAMINOPHEN 5-325 MG PO TABS: ORAL_TABLET | ORAL | 0 refills | Status: DC

## 2018-04-02 NOTE — Progress Notes (Signed)
   Subjective:    Patient ID: [name], female    DOB: 12-Nov-1945, 72 y.o.   MRN: [id number]  HPI 72 year old female is here today for chronic pain management.  Stable on current regimen.  She has been trying to walk 3-4 times per week which is fantastic and denies any symptoms such as constipation.  She also occasionally uses icy hot.  No recent flares or exacerbations.  Hyperlipidemia-currently on pravastatin.  She is not actually taking the atorvastatin.  She is tolerating it well and wants to stick with this regimen since she has had some intolerances to other statins.   Review of Systems     Objective:   Physical Exam  Constitutional: She is oriented to person, place, and time. She appears well-developed and well-nourished.  HENT:  Head: Normocephalic and atraumatic.  Cardiovascular: Normal rate, regular rhythm and normal heart sounds.  Pulmonary/Chest: Effort normal and breath sounds normal.  Musculoskeletal:  Normal ROM of the neck and the lumbar spine.  Flexion, extension and rotation bilaterally.   Neurological: She is alert and oriented to person, place, and time.  Skin: Skin is warm and dry.  Psychiatric: She has a normal mood and affect. Her behavior is normal.           Assessment & Plan:   Encounter for chronic pain management/chronic neck and back pain- Can try Turmeric for you arthritis pain and inflammation.  Stable on current regimen.  She has been trying to walk 3-4 times per week which is fantastic and denies any symptoms such as constipation.  She also occasionally uses icy hot.  Indication for chronic opioid: Chronic neck and back pain  medication and dose: Hydrocodone 5/325. # pills per month: 60 Last UDS date: 01/29/18.  Opioid Treatment Agreement signed (Y/N): Y Opioid Treatment Agreement last reviewed with patient: 11/29/2017  9:39 AM [facility] reviewed this encounter (include red flags):  Yes   Hyperlipidemia-continue current regimen due to  intolerance to other statins.   Lipid Panel     Component Value Date/Time   CHOL 230 (H) 01/29/2018 1021   TRIG 168 (H) 01/29/2018 1021   HDL 82 01/29/2018 1021   CHOLHDL 2.8 01/29/2018 1021   VLDL 41 (H) 11/23/2015 1007   LDLCALC 120 (H) 01/29/2018 1021

## 2018-04-02 NOTE — Patient Instructions (Addendum)
Can try Turmeric for you arthritis pain and inflammation.

## 2018-04-04 DIAGNOSIS — J3089 Other allergic rhinitis: Secondary | ICD-10-CM | POA: Diagnosis not present

## 2018-04-04 LAB — PAIN MGMT, OPIATES EXP. QN, UR
CODEINE: NEGATIVE ng/mL (ref <50)
Hydrocodone: NEGATIVE ng/mL (ref <50)
Hydromorphone: NEGATIVE ng/mL (ref <50)
MORPHINE: NEGATIVE ng/mL (ref <50)
NORHYDROCODONE: 96 ng/mL — AB (ref <50)
Noroxycodone: NEGATIVE ng/mL (ref <50)
Oxycodone: NEGATIVE ng/mL (ref <50)
Oxymorphone: NEGATIVE ng/mL (ref <50)

## 2018-04-16 DIAGNOSIS — J3089 Other allergic rhinitis: Secondary | ICD-10-CM | POA: Diagnosis not present

## 2018-04-18 ENCOUNTER — Ambulatory Visit: Payer: Medicare HMO | Admitting: Physician Assistant

## 2018-04-25 DIAGNOSIS — J3089 Other allergic rhinitis: Secondary | ICD-10-CM | POA: Diagnosis not present

## 2018-05-01 DIAGNOSIS — J3089 Other allergic rhinitis: Secondary | ICD-10-CM | POA: Diagnosis not present

## 2018-05-03 DIAGNOSIS — L814 Other melanin hyperpigmentation: Secondary | ICD-10-CM | POA: Diagnosis not present

## 2018-05-03 DIAGNOSIS — D1801 Hemangioma of skin and subcutaneous tissue: Secondary | ICD-10-CM | POA: Diagnosis not present

## 2018-05-03 DIAGNOSIS — L821 Other seborrheic keratosis: Secondary | ICD-10-CM | POA: Diagnosis not present

## 2018-05-03 DIAGNOSIS — D229 Melanocytic nevi, unspecified: Secondary | ICD-10-CM | POA: Diagnosis not present

## 2018-05-03 DIAGNOSIS — D225 Melanocytic nevi of trunk: Secondary | ICD-10-CM | POA: Diagnosis not present

## 2018-05-07 DIAGNOSIS — J3089 Other allergic rhinitis: Secondary | ICD-10-CM | POA: Diagnosis not present

## 2018-05-15 ENCOUNTER — Other Ambulatory Visit: Payer: Self-pay

## 2018-05-15 DIAGNOSIS — G500 Trigeminal neuralgia: Secondary | ICD-10-CM

## 2018-05-15 DIAGNOSIS — M810 Age-related osteoporosis without current pathological fracture: Secondary | ICD-10-CM

## 2018-05-15 DIAGNOSIS — M5136 Other intervertebral disc degeneration, lumbar region: Secondary | ICD-10-CM

## 2018-05-15 DIAGNOSIS — M542 Cervicalgia: Secondary | ICD-10-CM

## 2018-05-15 MED ORDER — HYDROCODONE-ACETAMINOPHEN 5-325 MG PO TABS: ORAL_TABLET | ORAL | 0 refills | Status: DC

## 2018-05-15 NOTE — Telephone Encounter (Signed)
Requesting RF on Norco be sent to [facility] [address] [address]   Last RX and last OV 04-02-18   RX pended, please review and send if appropriate

## 2018-05-16 ENCOUNTER — Other Ambulatory Visit: Payer: Self-pay | Admitting: Family Medicine

## 2018-05-17 DIAGNOSIS — J3089 Other allergic rhinitis: Secondary | ICD-10-CM | POA: Diagnosis not present

## 2018-05-23 DIAGNOSIS — J3089 Other allergic rhinitis: Secondary | ICD-10-CM | POA: Diagnosis not present

## 2018-05-29 DIAGNOSIS — J3089 Other allergic rhinitis: Secondary | ICD-10-CM | POA: Diagnosis not present

## 2018-05-31 ENCOUNTER — Other Ambulatory Visit: Payer: Self-pay

## 2018-05-31 DIAGNOSIS — E782 Mixed hyperlipidemia: Secondary | ICD-10-CM

## 2018-05-31 MED ORDER — PRAVASTATIN SODIUM 40 MG PO TABS: 40.0000 mg | ORAL_TABLET | Freq: Every day | ORAL | 3 refills | Status: DC

## 2018-06-05 DIAGNOSIS — J3089 Other allergic rhinitis: Secondary | ICD-10-CM | POA: Diagnosis not present

## 2018-06-07 DIAGNOSIS — R69 Illness, unspecified: Secondary | ICD-10-CM | POA: Diagnosis not present

## 2018-06-11 ENCOUNTER — Encounter: Payer: Self-pay | Admitting: Family Medicine

## 2018-06-11 ENCOUNTER — Ambulatory Visit (INDEPENDENT_AMBULATORY_CARE_PROVIDER_SITE_OTHER): Payer: Medicare HMO | Admitting: Family Medicine

## 2018-06-11 VITALS — BP 151/92 | HR 100 | Temp 98.2°F | Ht 67.0 in | Wt 135.0 lb

## 2018-06-11 DIAGNOSIS — M5136 Other intervertebral disc degeneration, lumbar region: Secondary | ICD-10-CM

## 2018-06-11 DIAGNOSIS — J0111 Acute recurrent frontal sinusitis: Secondary | ICD-10-CM | POA: Diagnosis not present

## 2018-06-11 DIAGNOSIS — M542 Cervicalgia: Secondary | ICD-10-CM | POA: Diagnosis not present

## 2018-06-11 DIAGNOSIS — G500 Trigeminal neuralgia: Secondary | ICD-10-CM

## 2018-06-11 DIAGNOSIS — M810 Age-related osteoporosis without current pathological fracture: Secondary | ICD-10-CM

## 2018-06-11 MED ORDER — PREDNISONE 10 MG PO TABS: 30.0000 mg | ORAL_TABLET | Freq: Every day | ORAL | 0 refills | Status: DC

## 2018-06-11 MED ORDER — HYDROCODONE-ACETAMINOPHEN 5-325 MG PO TABS: ORAL_TABLET | ORAL | 0 refills | Status: DC

## 2018-06-11 MED ORDER — AZITHROMYCIN 250 MG PO TABS: 250.0000 mg | ORAL_TABLET | Freq: Every day | ORAL | 0 refills | Status: DC

## 2018-06-11 NOTE — Progress Notes (Signed)
[name] is a 72 y.o. female who presents to [facility]: [place] today for sinus congestion headache cough and fatigue.  Symptoms present for about a week.  Symptoms are consistent with early symptoms that typically will develop into a sinus infection.  She is tried some Tylenol which helps a bit.  She is worried because she is going out of town for about a week starting in 2 days.  No vomiting diarrhea chest pain palpitations.  Patient also requests refill of her chronic hydrocodone.  She takes this for her chronic neck and back pain.  She notes medication works and allows her to function.  She tolerates it well.  ROS as above:  Exam:  BP (!) 151/92   Pulse 100   Temp 98.2 F (36.8 C) (Oral)   Ht 5\' 7"  (1.702 m)   Wt 135 lb (61.2 kg)   SpO2 95%   BMI 21.14 kg/m  Wt Readings from Last 5 Encounters:  06/11/18 135 lb (61.2 kg)  04/02/18 138 lb (62.6 kg)  01/31/18 135 lb 11.2 oz (61.6 kg)  01/29/18 135 lb (61.2 kg)  12/13/17 135 lb (61.2 kg)    Gen: Well NAD HEENT: EOMI,  MMM clear nasal discharge with inflamed nasal turbinates bilaterally.  Normal tympanic membranes bilaterally.  No significant cervical lymphadenopathy.  Tender to palpation bilateral frontal sinuses. Lungs: Normal work of breathing. CTABL Heart: RRR no MRG Abd: NABS, Soft. Nondistended, Nontender Exts: Brisk capillary refill, warm and well perfused.   Lab and Radiology Results No results found for this or any previous visit (from the past 72 hour(s)). No results found.    Assessment and Plan: 72 y.o. female with sinusitis likely viral at this point.  Plan for treatment with continued over-the-counter medications as well as short course of prednisone.  Back-up plan for azithromycin if worsening.  Chronic hydrocodone refilled for her chronic neck and back pain.  Next refill should come from  PCP.  Patient researched [facility] Controlled Substance Reporting System.  No orders of the defined types were placed in this encounter.  Meds ordered this encounter  Medications  . predniSONE (DELTASONE) 10 MG tablet    Sig: Take 3 tablets (30 mg total) by mouth daily with breakfast.    Dispense:  15 tablet    Refill:  0  . azithromycin (ZITHROMAX) 250 MG tablet    Sig: Take 1 tablet (250 mg total) by mouth daily. Take first 2 tablets together, then 1 every day until finished.    Dispense:  6 tablet    Refill:  0  . HYDROcodone-acetaminophen (NORCO/VICODIN) 5-325 MG tablet    Sig: TAKE 1 TABLET EVERY TWELVE HOURS AS NEEDED    Dispense:  60 tablet    Refill:  0     Historical information moved to improve visibility of documentation.  Past Medical History:  Diagnosis Date  . Breast cancer ([place]) 1999   T1N0 LEFT BREAST  . Cancer ([place]) 03-2006   METASTATIC BREAST TO APPARENT ISOLATED LEFT INTERNAL MAMMARY NODE  . Degenerative disc disease   . History of bladder infections    RECURRENT  . Hyperlipidemia   . Osteoporosis 03-04-2011    LAST BONE DENSITY 03-04-2011  . Recurrent sinusitis   . Thyroid disease   . Trigeminal neuralgia    Past Surgical History:  Procedure Laterality Date  . ABDOMINAL HYSTERECTOMY  2005   Complete  . BLADDER SUSPENSION  2005  . BREAST LUMPECTOMY Left 1999   malignant  . BREAST SURGERY  1999   Lumpectomy L Br  . BREAST SURGERY  2007   Lymph Node Removal L Chest Wall  . cancerous LN removed  2008  . left breast lumpectomy  1999   Social History   Tobacco Use  . Smoking status: Never Smoker  . Smokeless tobacco: Never Used  Substance Use Topics  . Alcohol use: No    Alcohol/week: 0.0 standard drinks   family history includes Cancer in her father, maternal aunt, mother, paternal grandmother, and son; Colon cancer in her father; Hyperlipidemia in her mother; Stomach cancer in her paternal uncle and paternal uncle; Stroke in her  mother.  Medications: Current Outpatient Medications  Medication Sig Dispense Refill  . Calcium-Magnesium-Vitamin D (CALCIUM 1200+D3 PO) Take by mouth daily.     . carbamazepine (TEGRETOL XR) 100 MG 12 hr tablet Take 1 tablet (100 mg total) by mouth 2 (two) times daily. 180 tablet 3  . denosumab (PROLIA) 60 MG/ML SOLN injection Inject 60 mg into the skin every 6 (six) months. Administer in upper arm, thigh, or abdomen    . EPINEPHrine (EPIPEN 2-PAK) 0.3 mg/0.3 mL IJ SOAJ injection Inject 0.3 mls (0.3 mg total) into the muscles AS NEEDED (allergic reaction) 2 Device prn  . fish oil-omega-3 fatty acids 1000 MG capsule Take 2 g by mouth daily.      . fluticasone (FLONASE) 50 MCG/ACT nasal spray Place 1 spray into both nostrils daily.  1  . HYDROcodone-acetaminophen (NORCO/VICODIN) 5-325 MG tablet TAKE 1 TABLET EVERY TWELVE HOURS AS NEEDED 60 tablet 0  . latanoprost (XALATAN) 0.005 % ophthalmic solution     . levocetirizine (XYZAL) 5 MG tablet Take 5 mg by mouth daily.      Marland Kitchen levothyroxine (SYNTHROID, LEVOTHROID) 50 MCG tablet TAKE 1 TABLET DAILY 90 tablet 0  . montelukast (SINGULAIR) 10 MG tablet     . omeprazole (PRILOSEC) 40 MG capsule TAKE 1 CAPSULE DAILY 90 capsule 3  . pravastatin (PRAVACHOL) 40 MG tablet Take 1 tablet (40 mg total) by mouth daily. 90 tablet 3  . senna-docusate (SENOKOT-S) 8.6-50 MG tablet Take 2 tablets by mouth at bedtime.    . sucralfate (CARAFATE) 1 G tablet TAKE 1 TABLET FOUR TIMES DAILY 120 tablet 0  . tamoxifen (NOLVADEX) 20 MG tablet Take 1 tablet (20 mg total) by mouth daily. 90 tablet 3  . triamterene-hydrochlorothiazide (MAXZIDE-25) 37.5-25 MG tablet TAKE 1 TABLET DAILY 90 tablet 3  . trimethoprim (TRIMPEX) 100 MG tablet Take 100 mg by mouth daily.    Marland Kitchen azithromycin (ZITHROMAX) 250 MG tablet Take 1 tablet (250 mg total) by mouth daily. Take first 2 tablets together, then 1 every day until finished. 6 tablet 0  . predniSONE (DELTASONE) 10 MG tablet Take 3 tablets  (30 mg total) by mouth daily with breakfast. 15 tablet 0   No current facility-administered medications for this visit.    Allergies  Allergen Reactions  . Simvastatin Other (See Comments)    Aches and memory loss  . Doxycycline Itching  . Other Rash  . Trazodone And Nefazodone Other (See Comments)    Memory impairment and palpitations.   . Ampicillin Rash  . Cefadroxil Rash  . Dicloxacillin Rash     Discussed warning signs or symptoms. Please see discharge instructions. Patient expresses understanding.

## 2018-06-11 NOTE — Patient Instructions (Signed)
Thank you for coming in today. Continue over the counter medicine.  Take prednisone for 5 days.  If worsening fill and take the azithromycin antibiotic.  Call or go to the emergency room if you get worse, have trouble breathing, have chest pains, or palpitations.    Sinusitis, Adult Sinusitis is soreness and inflammation of your sinuses. Sinuses are hollow spaces in the bones around your face. They are located:  Around your eyes.  In the middle of your forehead.  Behind your nose.  In your cheekbones.  Your sinuses and nasal passages are lined with a stringy fluid (mucus). Mucus normally drains out of your sinuses. When your nasal tissues get inflamed or swollen, the mucus can get trapped or blocked so air cannot flow through your sinuses. This lets bacteria, viruses, and funguses grow, and that leads to infection. Follow these instructions at home: Medicines  Take, use, or apply over-the-counter and prescription medicines only as told by your doctor. These may include nasal sprays.  If you were prescribed an antibiotic medicine, take it as told by your doctor. Do not stop taking the antibiotic even if you start to feel better. Hydrate and Humidify  Drink enough water to keep your pee (urine) clear or pale yellow.  Use a cool mist humidifier to keep the humidity level in your home above 50%.  Breathe in steam for 10-15 minutes, 3-4 times a day or as told by your doctor. You can do this in the bathroom while a hot shower is running.  Try not to spend time in cool or dry air. Rest  Rest as much as possible.  Sleep with your head raised (elevated).  Make sure to get enough sleep each night. General instructions  Put a warm, moist washcloth on your face 3-4 times a day or as told by your doctor. This will help with discomfort.  Wash your hands often with soap and water. If there is no soap and water, use hand sanitizer.  Do not smoke. Avoid being around people who are  smoking (secondhand smoke).  Keep all follow-up visits as told by your doctor. This is important. Contact a doctor if:  You have a fever.  Your symptoms get worse.  Your symptoms do not get better within 10 days. Get help right away if:  You have a very bad headache.  You cannot stop throwing up (vomiting).  You have pain or swelling around your face or eyes.  You have trouble seeing.  You feel confused.  Your neck is stiff.  You have trouble breathing. This information is not intended to replace advice given to you by your health care provider. Make sure you discuss any questions you have with your health care provider. Document Released: 02/01/2008 Document Revised: 04/10/2016 Document Reviewed: 06/10/2015 Elsevier Interactive Patient Education  [employer].

## 2018-06-12 ENCOUNTER — Telehealth: Payer: Self-pay

## 2018-06-12 DIAGNOSIS — J3089 Other allergic rhinitis: Secondary | ICD-10-CM | POA: Diagnosis not present

## 2018-06-12 MED ORDER — LEVOFLOXACIN 500 MG PO TABS: 500.0000 mg | ORAL_TABLET | Freq: Every day | ORAL | 0 refills | Status: DC

## 2018-06-12 NOTE — Telephone Encounter (Signed)
[name] broke out in a rash on her face and chest after taking the azithromycin. Denies shortness of breath or mouth swelling. She did take benadryl and it helped reduce the rash. I did put it on her allergy list. She would like a different antibiotic. Please advise.

## 2018-06-12 NOTE — Telephone Encounter (Signed)
Patient's husband advised 

## 2018-06-12 NOTE — Telephone Encounter (Signed)
Switch to levaquin. 500mg  daily.

## 2018-07-03 ENCOUNTER — Encounter: Payer: Self-pay | Admitting: Family Medicine

## 2018-07-03 ENCOUNTER — Ambulatory Visit (INDEPENDENT_AMBULATORY_CARE_PROVIDER_SITE_OTHER): Payer: Medicare HMO | Admitting: Family Medicine

## 2018-07-03 ENCOUNTER — Telehealth: Payer: Self-pay | Admitting: Family Medicine

## 2018-07-03 VITALS — BP 135/70 | HR 115 | Ht 65.0 in | Wt 133.0 lb

## 2018-07-03 DIAGNOSIS — G500 Trigeminal neuralgia: Secondary | ICD-10-CM

## 2018-07-03 DIAGNOSIS — M542 Cervicalgia: Secondary | ICD-10-CM | POA: Diagnosis not present

## 2018-07-03 DIAGNOSIS — C50912 Malignant neoplasm of unspecified site of left female breast: Secondary | ICD-10-CM | POA: Diagnosis not present

## 2018-07-03 DIAGNOSIS — M5136 Other intervertebral disc degeneration, lumbar region: Secondary | ICD-10-CM

## 2018-07-03 DIAGNOSIS — E039 Hypothyroidism, unspecified: Secondary | ICD-10-CM | POA: Diagnosis not present

## 2018-07-03 DIAGNOSIS — M810 Age-related osteoporosis without current pathological fracture: Secondary | ICD-10-CM | POA: Diagnosis not present

## 2018-07-03 DIAGNOSIS — L989 Disorder of the skin and subcutaneous tissue, unspecified: Secondary | ICD-10-CM | POA: Diagnosis not present

## 2018-07-03 DIAGNOSIS — M51369 Other intervertebral disc degeneration, lumbar region without mention of lumbar back pain or lower extremity pain: Secondary | ICD-10-CM

## 2018-07-03 DIAGNOSIS — G8929 Other chronic pain: Secondary | ICD-10-CM

## 2018-07-03 MED ORDER — HYDROCODONE-ACETAMINOPHEN 5-325 MG PO TABS: ORAL_TABLET | ORAL | 0 refills | Status: DC

## 2018-07-03 NOTE — Telephone Encounter (Signed)
Per PCP, Pt to start Prolia injections. Labs pending completion. Prolia ordered.  Routing to see is PA is required. Then Pt can be scheduled.

## 2018-07-03 NOTE — Progress Notes (Signed)
Subjective:    CC:   HPI: Today for chronic pain management for her degenerative lumbar disc disease and neck pain.  She has a red lesion on her right mid shin that is bothering her. No itchy or sore.  Has been present for several weeks.   Hypothyroidism - Taking medication regularly in the AM away from food and vitamins, etc. No recent change to skin, hair, or energy levels.  Osteoporosis-she is due for her next Prolia injection.  Last one was Jan 25, 2018.  Past medical history, Surgical history, Family history not pertinant except as noted below, Social history, Allergies, and medications have been entered into the medical record, reviewed, and corrections made.   Review of Systems: No fevers, chills, night sweats, weight loss, chest pain, or shortness of breath.   Objective:    General: Well Developed, well nourished, and in no acute distress.  Neuro: Alert and oriented x3, extra-ocular muscles intact, sensation grossly intact.  HEENT: Normocephalic, atraumatic  Skin: Warm and dry, no rashes. Cardiac: Regular rate and rhythm, no murmurs rubs or gallops, no lower extremity edema.  Respiratory: Clear to auscultation bilaterally. Not using accessory muscles, speaking in full sentences.   Impression and Recommendations:   Chronic pain management/chronic neck and back pain /DDD disc -due current regimen.  Medications refilled.  Doing well overall.  Urine drug screen up-to-date as well as contract.   Indication for chronic opioid:Chronic neck and back pain  medication and dose:Hydrocodone 5/325. # pills per month:60 Last UDS date:01/29/18. Opioid Treatment Agreement signed (Y/N):Y Opioid Treatment Agreement last reviewed with patient:11/29/2017 9:39 AM N[facility] reviewed this encounter (include red flags):Yes  Hypothyroidism - due to recheck TSH.   Osteporosis-we will call and get the Prolia ordered.  She is due for her next injection will check creatinine and calcium  level.  Will call once we get the shipment in.  Breast cancer currently on tamoxifen.  She would like referral to Dr. Verdell Carmine for continuing care.  Dr. Marko Plume retired.

## 2018-07-04 DIAGNOSIS — J3089 Other allergic rhinitis: Secondary | ICD-10-CM | POA: Diagnosis not present

## 2018-07-04 LAB — COMPLETE METABOLIC PANEL WITH GFR
AG RATIO: 2 (calc) (ref 1.0–2.5)
ALT: 12 U/L (ref 6–29)
AST: 24 U/L (ref 10–35)
Albumin: 4.7 g/dL (ref 3.6–5.1)
Alkaline phosphatase (APISO): 47 U/L (ref 33–130)
BUN: 18 mg/dL (ref 7–25)
CALCIUM: 10.5 mg/dL — AB (ref 8.6–10.4)
CO2: 31 mmol/L (ref 20–32)
Chloride: 101 mmol/L (ref 98–110)
Creat: 0.9 mg/dL (ref 0.60–0.93)
GFR, EST AFRICAN AMERICAN: 75 mL/min/{1.73_m2} (ref >=60)
GFR, EST NON AFRICAN AMERICAN: 64 mL/min/{1.73_m2} (ref >=60)
Globulin: 2.3 g/dL (calc) (ref 1.9–3.7)
Glucose, Bld: 99 mg/dL (ref 65–99)
POTASSIUM: 4.2 mmol/L (ref 3.5–5.3)
SODIUM: 140 mmol/L (ref 135–146)
TOTAL PROTEIN: 7 g/dL (ref 6.1–8.1)
Total Bilirubin: 0.4 mg/dL (ref 0.2–1.2)

## 2018-07-04 LAB — TSH: TSH: 2.46 m[IU]/L (ref 0.40–4.50)

## 2018-07-04 LAB — VITAMIN D 25 HYDROXY (VIT D DEFICIENCY, FRACTURES): Vit D, 25-Hydroxy: 29 ng/mL — ABNORMAL LOW (ref 30–100)

## 2018-07-04 NOTE — Telephone Encounter (Signed)
Information has been sent to the insurance and waiting on response.

## 2018-07-05 NOTE — Telephone Encounter (Signed)
Test claim pays and does not indicate a prior authorization is needed for the requested medication/quantity under the patient's Part D prescription benefit. If you have questions or need further assistance, please call the number on the back of the patient's insurance card.

## 2018-07-05 NOTE — Telephone Encounter (Signed)
Patient is agreeable to estimate out of pocket for Prolia. She has follow up. -hsm.

## 2018-07-09 ENCOUNTER — Ambulatory Visit (INDEPENDENT_AMBULATORY_CARE_PROVIDER_SITE_OTHER): Payer: Medicare HMO | Admitting: Family Medicine

## 2018-07-09 VITALS — BP 136/72 | HR 98 | Temp 97.2°F | Wt 132.0 lb

## 2018-07-09 DIAGNOSIS — M810 Age-related osteoporosis without current pathological fracture: Secondary | ICD-10-CM | POA: Diagnosis not present

## 2018-07-09 MED ORDER — DENOSUMAB 60 MG/ML ~~LOC~~ SOSY
60.0000 mg | PREFILLED_SYRINGE | Freq: Once | SUBCUTANEOUS | Status: AC
  Administered 2018-07-09: 60 mg via SUBCUTANEOUS

## 2018-07-09 NOTE — Progress Notes (Signed)
Pt in today for Prolia injection. Labs verified. 60mg  prolia was given sq in right arm. Pt tolerated well, without complications.Pt advised to return in 6 months.

## 2018-07-10 NOTE — Progress Notes (Signed)
Agree with documentation as above.   Corrissa Martello, MD  

## 2018-07-11 ENCOUNTER — Encounter: Payer: Self-pay | Admitting: Family Medicine

## 2018-07-11 ENCOUNTER — Ambulatory Visit (INDEPENDENT_AMBULATORY_CARE_PROVIDER_SITE_OTHER): Payer: Medicare HMO | Admitting: Family Medicine

## 2018-07-11 VITALS — BP 142/85 | HR 106 | Ht 65.0 in | Wt 132.0 lb

## 2018-07-11 DIAGNOSIS — R519 Headache, unspecified: Secondary | ICD-10-CM

## 2018-07-11 DIAGNOSIS — R51 Headache: Secondary | ICD-10-CM | POA: Diagnosis not present

## 2018-07-11 DIAGNOSIS — R000 Tachycardia, unspecified: Secondary | ICD-10-CM

## 2018-07-11 MED ORDER — KETOROLAC TROMETHAMINE 60 MG/2ML IM SOLN
60.0000 mg | Freq: Once | INTRAMUSCULAR | Status: AC
  Administered 2018-07-11: 60 mg via INTRAMUSCULAR

## 2018-07-11 NOTE — Patient Instructions (Signed)
Call back tomorrow if the shot today does not break your headache.

## 2018-07-11 NOTE — Progress Notes (Signed)
Subjective:    Patient ID: [name], female    DOB: 07/14/1946, 72 y.o.   MRN: [id number]  HPI  HA x 2 days.  taking tylenol ES she stated that this helps denies any visual changes,pain is across her forehead she said that she is tired and not been able to sleep well. she thinks that it may be a sinus infection but doesn't report any sinus issues. no f/s/c.  She denies any nausea or vomiting with it.  No significant nasal congestion she has had a little bit of a sore throat on the left side.  She rates her headache a 3 out of 10 this morning.  The Tylenol does help for a few hours but it does not make the headache go completely away she denies any vision changes.  She thinks part of it is just that she is worn out.  She and her husband worked at the state [place] 10 days and work 12 hours a day each day.  Her last thyroid level a couple of days ago look fantastic.  Review of Systems  BP (!) 142/85   Pulse (!) 106   Ht 5\' 5"  (1.651 m)   Wt 132 lb (59.9 kg)   SpO2 98%   BMI 21.97 kg/m     Allergies  Allergen Reactions  . Simvastatin Other (See Comments)    Aches and memory loss  . Azithromycin Hives  . Doxycycline Itching  . Other Rash  . Trazodone And Nefazodone Other (See Comments)    Memory impairment and palpitations.   . Ampicillin Rash  . Cefadroxil Rash  . Dicloxacillin Rash    Past Medical History:  Diagnosis Date  . Breast cancer ([place]) 1999   T1N0 LEFT BREAST  . Cancer ([place]) 03-2006   METASTATIC BREAST TO APPARENT ISOLATED LEFT INTERNAL MAMMARY NODE  . Degenerative disc disease   . History of bladder infections    RECURRENT  . Hyperlipidemia   . Osteoporosis 03-04-2011    LAST BONE DENSITY 03-04-2011  . Recurrent sinusitis   . Thyroid disease   . Trigeminal neuralgia     Past Surgical History:  Procedure Laterality Date  . ABDOMINAL HYSTERECTOMY  2005   Complete  . BLADDER SUSPENSION  2005  . BREAST LUMPECTOMY Left 1999   malignant  . BREAST SURGERY   1999   Lumpectomy L Br  . BREAST SURGERY  2007   Lymph Node Removal L Chest Wall  . cancerous LN removed  2008  . left breast lumpectomy  1999    Social History   Socioeconomic History  . Marital status: Married    Spouse name: [name]   . Number of children: 3  . Years of education: Therapist, sports  . Highest education level: Not on file  Occupational History    Comment: Retired- Therapist, sports  Social Needs  . Financial resource strain: Not on file  . Food insecurity:    Worry: Not on file    Inability: Not on file  . Transportation needs:    Medical: Not on file    Non-medical: Not on file  Tobacco Use  . Smoking status: Never Smoker  . Smokeless tobacco: Never Used  Substance and Sexual Activity  . Alcohol use: No    Alcohol/week: 0.0 standard drinks  . Drug use: No  . Sexual activity: Not on file    Comment: housewife, married, 3 kids.  Lifestyle  . Physical activity:    Days per week:  Not on file    Minutes per session: Not on file  . Stress: Not on file  Relationships  . Social connections:    Talks on phone: Not on file    Gets together: Not on file    Attends religious service: Not on file    Active member of club or organization: Not on file    Attends meetings of clubs or organizations: Not on file    Relationship status: Not on file  . Intimate partner violence:    Fear of current or ex partner: Not on file    Emotionally abused: Not on file    Physically abused: Not on file    Forced sexual activity: Not on file  Other Topics Concern  . Not on file  Social History Narrative   Patient lives at home with her husband [name])   Retired - RN   Right handed.   Caffeine two cups coffee daily and one sweet tea.    Family History  Problem Relation Age of Onset  . Cancer Mother        breast  . Stroke Mother   . Hyperlipidemia Mother   . Cancer Father        bladder  . Colon cancer Father   . Cancer Paternal Grandmother        Mouth  . Cancer Son        Breast  .  Cancer Maternal Aunt        Breast  . Stomach cancer Paternal Uncle   . Stomach cancer Paternal Uncle     Outpatient Encounter Medications as of 07/11/2018  Medication Sig  . Calcium-Magnesium-Vitamin D (CALCIUM 1200+D3 PO) Take by mouth daily.   . carbamazepine (TEGRETOL XR) 100 MG 12 hr tablet Take 1 tablet (100 mg total) by mouth 2 (two) times daily.  Marland Kitchen denosumab (PROLIA) 60 MG/ML SOLN injection Inject 60 mg into the skin every 6 (six) months. Administer in upper arm, thigh, or abdomen  . EPINEPHrine (EPIPEN 2-PAK) 0.3 mg/0.3 mL IJ SOAJ injection Inject 0.3 mls (0.3 mg total) into the muscles AS NEEDED (allergic reaction)  . fish oil-omega-3 fatty acids 1000 MG capsule Take 2 g by mouth daily.    . fluticasone (FLONASE) 50 MCG/ACT nasal spray Place 1 spray into both nostrils daily.  Marland Kitchen HYDROcodone-acetaminophen (NORCO/VICODIN) 5-325 MG tablet TAKE 1 TABLET EVERY TWELVE HOURS AS NEEDED  . latanoprost (XALATAN) 0.005 % ophthalmic solution   . levocetirizine (XYZAL) 5 MG tablet Take 5 mg by mouth daily.    Marland Kitchen levothyroxine (SYNTHROID, LEVOTHROID) 50 MCG tablet TAKE 1 TABLET DAILY  . montelukast (SINGULAIR) 10 MG tablet   . omeprazole (PRILOSEC) 40 MG capsule TAKE 1 CAPSULE DAILY  . pravastatin (PRAVACHOL) 40 MG tablet Take 1 tablet (40 mg total) by mouth daily.  Marland Kitchen senna-docusate (SENOKOT-S) 8.6-50 MG tablet Take 2 tablets by mouth at bedtime.  . sucralfate (CARAFATE) 1 G tablet TAKE 1 TABLET FOUR TIMES DAILY  . tamoxifen (NOLVADEX) 20 MG tablet Take 1 tablet (20 mg total) by mouth daily.  Marland Kitchen triamterene-hydrochlorothiazide (MAXZIDE-25) 37.5-25 MG tablet TAKE 1 TABLET DAILY  . trimethoprim (TRIMPEX) 100 MG tablet Take 100 mg by mouth daily.  . [EXPIRED] ketorolac (TORADOL) injection 60 mg    No facility-administered encounter medications on file as of 07/11/2018.          Objective:   Physical Exam  Constitutional: She is oriented to person, place, and time. She appears well-developed  and  well-nourished.  HENT:  Head: Normocephalic and atraumatic.  Right Ear: External ear normal.  Left Ear: External ear normal.  Nose: Nose normal.  Mouth/Throat: Oropharynx is clear and moist.  TMs and canals are clear.   Eyes: Pupils are equal, round, and reactive to light. Conjunctivae and EOM are normal.  Neck: Neck supple. No thyromegaly present.  Cardiovascular: Regular rhythm and normal heart sounds.  + tachy  Pulmonary/Chest: Effort normal and breath sounds normal. She has no wheezes.  Lymphadenopathy:    She has no cervical adenopathy.  Neurological: She is alert and oriented to person, place, and time.  Skin: Skin is warm and dry.  Psychiatric: She has a normal mood and affect.        Assessment & Plan:  Frontal headache, persistent-recommend trial of Toradol injection today.  If that does not break the headache and encouraged her to call us back tomorrow and we can always do a steroid taper if needed.  If she develops any new symptoms such as significant congestion or fevers or chills to please let us know immediately.  Assured her that she really does not have any additional sinus symptoms I do not think this is a sinus infection.  Tachycardic-her pulse tends to run between 90 and about 110.  I did recheck it at the end of the visit and her pulse was 106.  She denies taking any decongestants and just encouraged her to really increase her fluid intake today.

## 2018-07-12 DIAGNOSIS — J3089 Other allergic rhinitis: Secondary | ICD-10-CM | POA: Diagnosis not present

## 2018-07-16 DIAGNOSIS — G8929 Other chronic pain: Secondary | ICD-10-CM | POA: Diagnosis not present

## 2018-07-16 DIAGNOSIS — C50912 Malignant neoplasm of unspecified site of left female breast: Secondary | ICD-10-CM | POA: Diagnosis not present

## 2018-07-16 DIAGNOSIS — E039 Hypothyroidism, unspecified: Secondary | ICD-10-CM | POA: Diagnosis not present

## 2018-07-16 DIAGNOSIS — H269 Unspecified cataract: Secondary | ICD-10-CM | POA: Diagnosis not present

## 2018-07-16 DIAGNOSIS — G3184 Mild cognitive impairment, so stated: Secondary | ICD-10-CM | POA: Diagnosis not present

## 2018-07-16 DIAGNOSIS — K219 Gastro-esophageal reflux disease without esophagitis: Secondary | ICD-10-CM | POA: Diagnosis not present

## 2018-07-16 DIAGNOSIS — K5900 Constipation, unspecified: Secondary | ICD-10-CM | POA: Diagnosis not present

## 2018-07-16 DIAGNOSIS — J309 Allergic rhinitis, unspecified: Secondary | ICD-10-CM | POA: Diagnosis not present

## 2018-07-16 DIAGNOSIS — E785 Hyperlipidemia, unspecified: Secondary | ICD-10-CM | POA: Diagnosis not present

## 2018-07-16 DIAGNOSIS — G500 Trigeminal neuralgia: Secondary | ICD-10-CM | POA: Diagnosis not present

## 2018-07-20 DIAGNOSIS — Z170 Estrogen receptor positive status [ER+]: Secondary | ICD-10-CM | POA: Diagnosis not present

## 2018-07-20 DIAGNOSIS — N952 Postmenopausal atrophic vaginitis: Secondary | ICD-10-CM | POA: Diagnosis not present

## 2018-07-20 DIAGNOSIS — T386X5A Adverse effect of antigonadotrophins, antiestrogens, antiandrogens, not elsewhere classified, initial encounter: Secondary | ICD-10-CM | POA: Diagnosis not present

## 2018-07-20 DIAGNOSIS — Z5181 Encounter for therapeutic drug level monitoring: Secondary | ICD-10-CM | POA: Diagnosis not present

## 2018-07-20 DIAGNOSIS — M818 Other osteoporosis without current pathological fracture: Secondary | ICD-10-CM | POA: Diagnosis not present

## 2018-07-20 DIAGNOSIS — C50512 Malignant neoplasm of lower-outer quadrant of left female breast: Secondary | ICD-10-CM | POA: Diagnosis not present

## 2018-07-20 DIAGNOSIS — Z842 Family history of other diseases of the genitourinary system: Secondary | ICD-10-CM | POA: Diagnosis not present

## 2018-07-20 DIAGNOSIS — C771 Secondary and unspecified malignant neoplasm of intrathoracic lymph nodes: Secondary | ICD-10-CM | POA: Diagnosis not present

## 2018-07-31 DIAGNOSIS — J3089 Other allergic rhinitis: Secondary | ICD-10-CM | POA: Diagnosis not present

## 2018-08-06 DIAGNOSIS — J3089 Other allergic rhinitis: Secondary | ICD-10-CM | POA: Diagnosis not present

## 2018-08-06 DIAGNOSIS — R413 Other amnesia: Secondary | ICD-10-CM | POA: Diagnosis not present

## 2018-08-06 DIAGNOSIS — R21 Rash and other nonspecific skin eruption: Secondary | ICD-10-CM | POA: Diagnosis not present

## 2018-08-10 DIAGNOSIS — L570 Actinic keratosis: Secondary | ICD-10-CM | POA: Diagnosis not present

## 2018-08-10 DIAGNOSIS — C44729 Squamous cell carcinoma of skin of left lower limb, including hip: Secondary | ICD-10-CM | POA: Diagnosis not present

## 2018-08-10 DIAGNOSIS — D485 Neoplasm of uncertain behavior of skin: Secondary | ICD-10-CM | POA: Diagnosis not present

## 2018-08-10 DIAGNOSIS — L814 Other melanin hyperpigmentation: Secondary | ICD-10-CM | POA: Diagnosis not present

## 2018-08-31 ENCOUNTER — Other Ambulatory Visit: Payer: Self-pay | Admitting: Family Medicine

## 2018-08-31 DIAGNOSIS — G500 Trigeminal neuralgia: Secondary | ICD-10-CM

## 2018-08-31 DIAGNOSIS — M810 Age-related osteoporosis without current pathological fracture: Secondary | ICD-10-CM

## 2018-08-31 DIAGNOSIS — M542 Cervicalgia: Secondary | ICD-10-CM

## 2018-08-31 DIAGNOSIS — M5136 Other intervertebral disc degeneration, lumbar region: Secondary | ICD-10-CM

## 2018-08-31 MED ORDER — HYDROCODONE-ACETAMINOPHEN 5-325 MG PO TABS: ORAL_TABLET | ORAL | 0 refills | Status: DC

## 2018-08-31 NOTE — Telephone Encounter (Signed)
Called patient and she voices understanding. No further questions during the call.

## 2018-08-31 NOTE — Telephone Encounter (Signed)
Patient called and is requesting a refill of her Hydrocodone. Last fill was 07/03/2018. Patient has a follow up next month with you. Please advise if ok to fill.

## 2018-09-10 DIAGNOSIS — C44729 Squamous cell carcinoma of skin of left lower limb, including hip: Secondary | ICD-10-CM | POA: Diagnosis not present

## 2018-09-27 DIAGNOSIS — J3089 Other allergic rhinitis: Secondary | ICD-10-CM | POA: Diagnosis not present

## 2018-10-02 DIAGNOSIS — H8101 Meniere's disease, right ear: Secondary | ICD-10-CM | POA: Diagnosis not present

## 2018-10-02 DIAGNOSIS — H9041 Sensorineural hearing loss, unilateral, right ear, with unrestricted hearing on the contralateral side: Secondary | ICD-10-CM | POA: Diagnosis not present

## 2018-10-03 ENCOUNTER — Ambulatory Visit (INDEPENDENT_AMBULATORY_CARE_PROVIDER_SITE_OTHER): Payer: Medicare HMO | Admitting: Family Medicine

## 2018-10-03 ENCOUNTER — Encounter: Payer: Self-pay | Admitting: Family Medicine

## 2018-10-03 VITALS — BP 125/74 | HR 101 | Ht 65.0 in | Wt 135.0 lb

## 2018-10-03 DIAGNOSIS — C50912 Malignant neoplasm of unspecified site of left female breast: Secondary | ICD-10-CM

## 2018-10-03 DIAGNOSIS — M542 Cervicalgia: Secondary | ICD-10-CM

## 2018-10-03 DIAGNOSIS — G8929 Other chronic pain: Secondary | ICD-10-CM | POA: Diagnosis not present

## 2018-10-03 DIAGNOSIS — G500 Trigeminal neuralgia: Secondary | ICD-10-CM

## 2018-10-03 DIAGNOSIS — M5136 Other intervertebral disc degeneration, lumbar region: Secondary | ICD-10-CM

## 2018-10-03 DIAGNOSIS — M810 Age-related osteoporosis without current pathological fracture: Secondary | ICD-10-CM | POA: Diagnosis not present

## 2018-10-03 MED ORDER — HYDROCODONE-ACETAMINOPHEN 5-325 MG PO TABS: ORAL_TABLET | ORAL | 0 refills | Status: DC

## 2018-10-03 NOTE — Progress Notes (Addendum)
Subjective:    CC: Chronic Pain Management.    HPI:  73 year old female comes in today for chronic pain management for degenerative lumbar disc disease and chronic neck pain-she is currently on hydrocodone.  Had any recent flares with her back.  She is doing well with her pain medication and feels like it is effective.  She has a good bowel regimen in place.  She is due for refills.  Osteoporosis-she is taking her calcium and vitamin D daily.  She gets Prolia injections every 6 months last one was November 11.  She tolerates those well.  She has a history of breast cancer and recently established with a new oncologist with Dr. Verdell Carmine here locally.  Her previous oncologist retired and she wanted to find somebody more local to the area so she did not have to drive to [place].  Past medical history, Surgical history, Family history not pertinant except as noted below, Social history, Allergies, and medications have been entered into the medical record, reviewed, and corrections made.   Review of Systems: No fevers, chills, night sweats, weight loss, chest pain, or shortness of breath.   Objective:    General: Well Developed, well nourished, and in no acute distress.  Neuro: Alert and oriented x3, extra-ocular muscles intact, sensation grossly intact.  HEENT: Normocephalic, atraumatic  Skin: Warm and dry, no rashes. Cardiac: Regular rate and rhythm, no murmurs rubs or gallops, no lower extremity edema.  Respiratory: Clear to auscultation bilaterally. Not using accessory muscles, speaking in full sentences. MSK: Normal flexion, extension, rotation and side bending of the cervical spine.  Normal flexion and extension.  Decreased rotation to the left compared to the right.  Normal side bending.  She did have pain with coming back up from flexion through extension with her lumbar spine.   Impression and Recommendations:    Chronic pain management for degenerative disc disease of the  lumbar spine as well as chronic neck pain and trigeminal neuralgia.-pain contract updated today.  Low up in 3 months.  Medications refilled today.  Indication for chronic opioid:Chronic neck and back pain  medication and dose:Hydrocodone 5/325. # pills per month:60 Last UDS date:10/03/18. Opioid Treatment Agreement signed (Y/N):Y Opioid Treatment Agreement last reviewed with patient:today [facility] reviewed this encounter (include red flags):Yes  PDMP reviewed during this encounter.  Osteoporosis - she is on Prolia every 6 months. Due for middle of May.   Left breast cancer-now following with Dr. Verdell Carmine.  She is still on tamoxifen.

## 2018-10-07 LAB — PAIN MGMT, PROFILE 6 CONF W/O MM, U
6 ACETYLMORPHINE: NEGATIVE ng/mL (ref <10)
AMPHETAMINES: NEGATIVE ng/mL (ref <500)
Alcohol Metabolites: NEGATIVE ng/mL (ref <500)
BENZODIAZEPINES: NEGATIVE ng/mL (ref <100)
Barbiturates: NEGATIVE ng/mL (ref <300)
CODEINE: NEGATIVE ng/mL (ref <50)
Cocaine Metabolite: NEGATIVE ng/mL (ref <150)
Creatinine: 138.2 mg/dL
Hydrocodone: 168 ng/mL — ABNORMAL HIGH (ref <50)
Hydromorphone: 94 ng/mL — ABNORMAL HIGH (ref <50)
MARIJUANA METABOLITE: NEGATIVE ng/mL (ref <20)
MORPHINE: NEGATIVE ng/mL (ref <50)
Methadone Metabolite: NEGATIVE ng/mL (ref <100)
NORHYDROCODONE: 1009 ng/mL — AB (ref <50)
OXYCODONE: NEGATIVE ng/mL (ref <100)
Opiates: POSITIVE ng/mL — AB (ref <100)
Oxidant: NEGATIVE ug/mL (ref <200)
Phencyclidine: NEGATIVE ng/mL (ref <25)
pH: 6.73 (ref 4.5–9.0)

## 2018-10-31 ENCOUNTER — Other Ambulatory Visit: Payer: Self-pay

## 2018-10-31 MED ORDER — LEVOTHYROXINE SODIUM 50 MCG PO TABS: 50.0000 ug | ORAL_TABLET | Freq: Every day | ORAL | 0 refills | Status: DC

## 2018-11-13 DIAGNOSIS — H10013 Acute follicular conjunctivitis, bilateral: Secondary | ICD-10-CM | POA: Diagnosis not present

## 2018-11-16 ENCOUNTER — Other Ambulatory Visit: Payer: Self-pay

## 2018-11-16 DIAGNOSIS — M5136 Other intervertebral disc degeneration, lumbar region: Secondary | ICD-10-CM

## 2018-11-16 DIAGNOSIS — M542 Cervicalgia: Secondary | ICD-10-CM

## 2018-11-16 DIAGNOSIS — G500 Trigeminal neuralgia: Secondary | ICD-10-CM

## 2018-11-16 MED ORDER — HYDROCODONE-ACETAMINOPHEN 5-325 MG PO TABS: ORAL_TABLET | ORAL | 0 refills | Status: DC

## 2018-11-16 NOTE — Telephone Encounter (Signed)
[name] needs a refill on Hydrocodone.

## 2018-11-23 DIAGNOSIS — B078 Other viral warts: Secondary | ICD-10-CM | POA: Diagnosis not present

## 2018-11-23 DIAGNOSIS — L570 Actinic keratosis: Secondary | ICD-10-CM | POA: Diagnosis not present

## 2018-11-23 DIAGNOSIS — L720 Epidermal cyst: Secondary | ICD-10-CM | POA: Diagnosis not present

## 2018-11-23 DIAGNOSIS — Z85828 Personal history of other malignant neoplasm of skin: Secondary | ICD-10-CM | POA: Diagnosis not present

## 2018-11-28 ENCOUNTER — Other Ambulatory Visit: Payer: Self-pay | Admitting: Family Medicine

## 2018-11-28 DIAGNOSIS — Z1231 Encounter for screening mammogram for malignant neoplasm of breast: Secondary | ICD-10-CM

## 2018-12-13 IMAGING — CT CT PARANASAL SINUSES LIMITED
1 series · 13 of 15 positions shown, 17 images · non-contrast
Comparison: 11/23/2011

CLINICAL DATA: Chronic frontal sinusitis

EXAM:
CT PARANASAL SINUS LIMITED WITHOUT CONTRAST
TECHNIQUE: Non-contiguous multidetector CT images of the paranasal sinuses were
obtained in a single plane without contrast.

[Series 3: limited sinus st · axial · 0.33mm/px · z∈[+1256,+1376]mm · 13 of 15 slices shown, 17 images]
[im 2/15  brain]
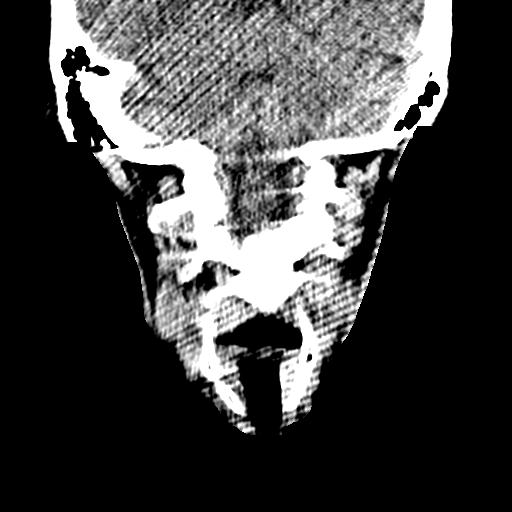
[im 2/15  bone]
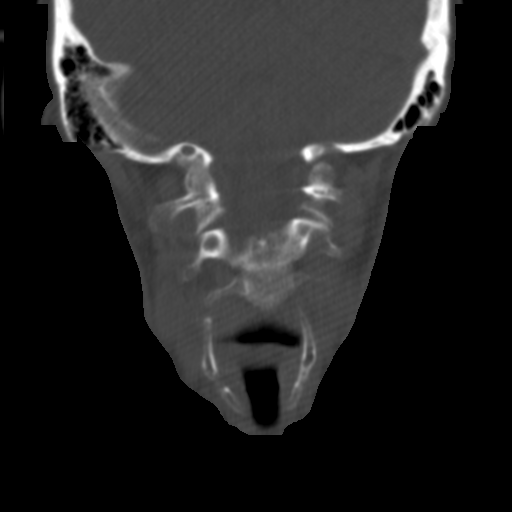
[im 3/15  bone]
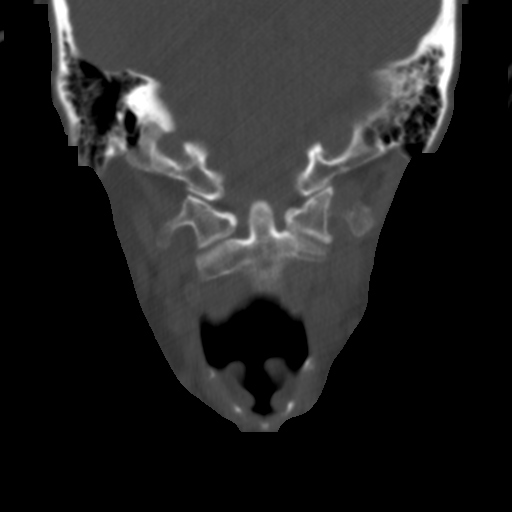
[im 4/15  bone]
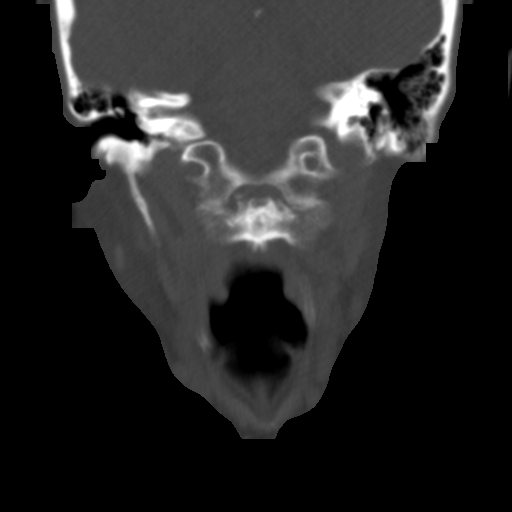
[im 5/15  bone]
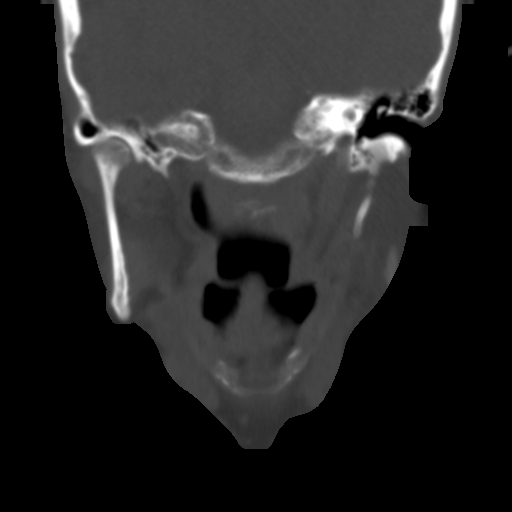
[im 6/15  brain]
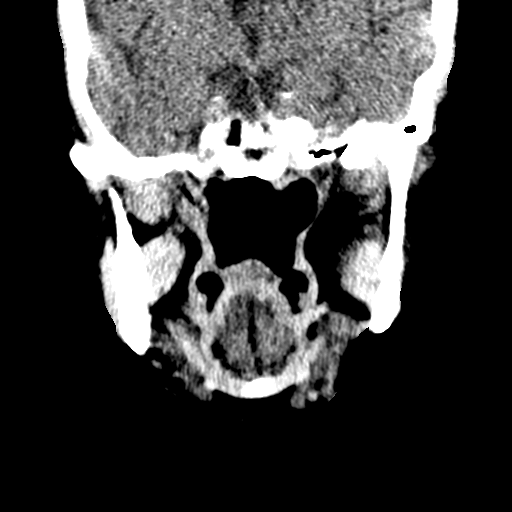
[im 6/15  bone]
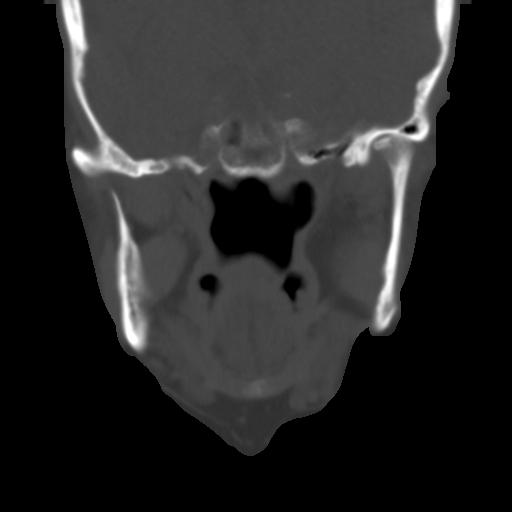
[im 7/15  bone]
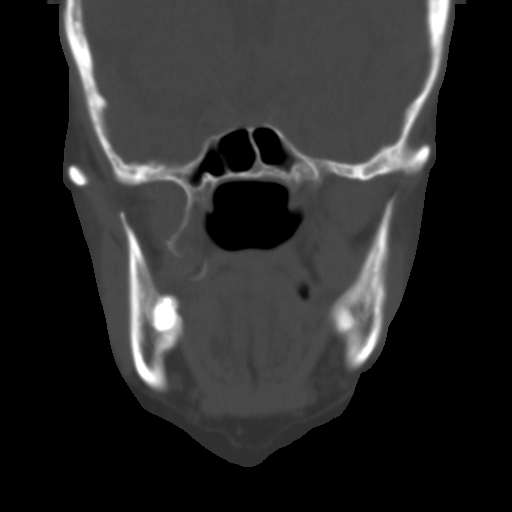
[im 8/15  bone]
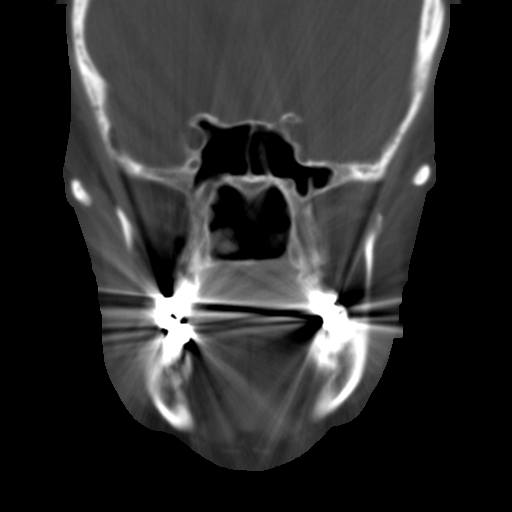
[im 9/15  bone]
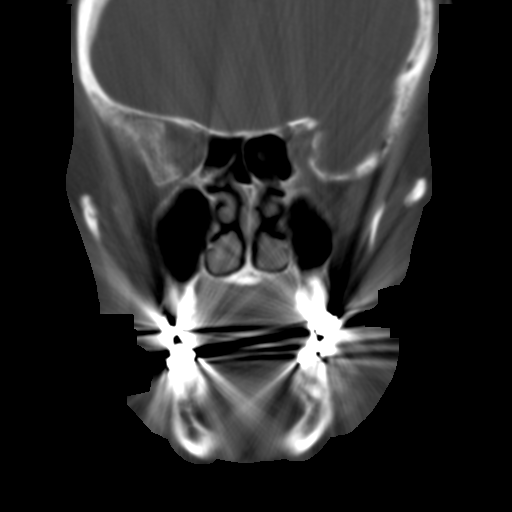
[im 10/15  brain]
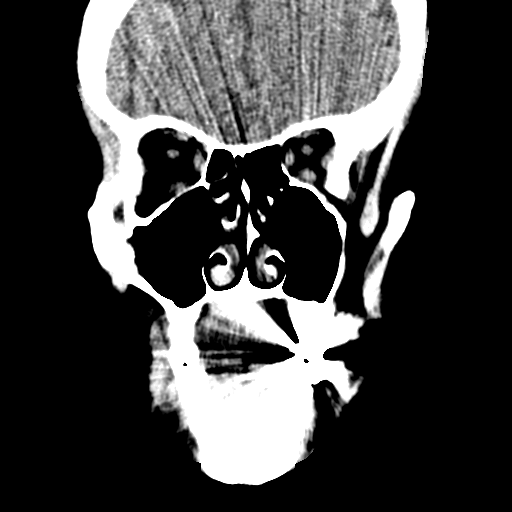
[im 10/15  bone]
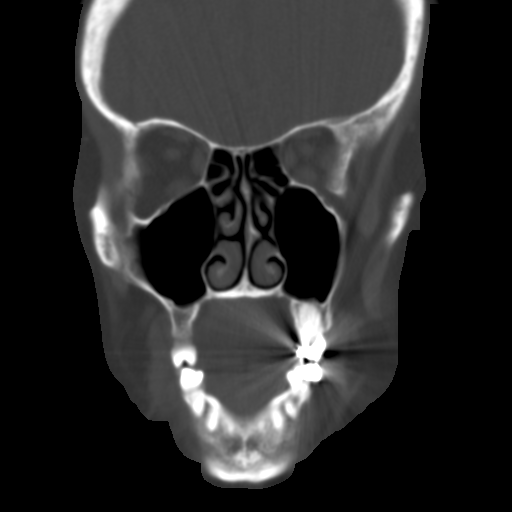
[im 11/15  bone]
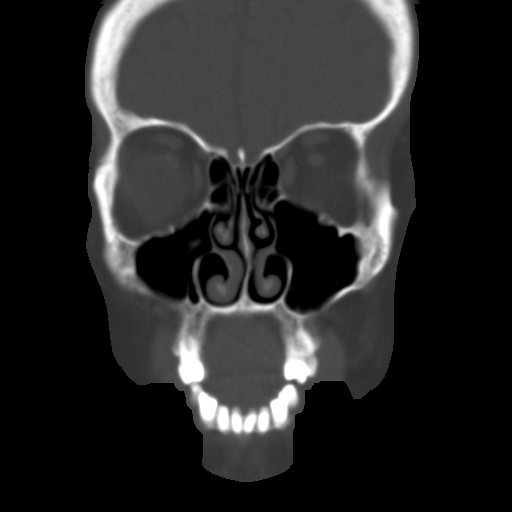
[im 12/15  bone]
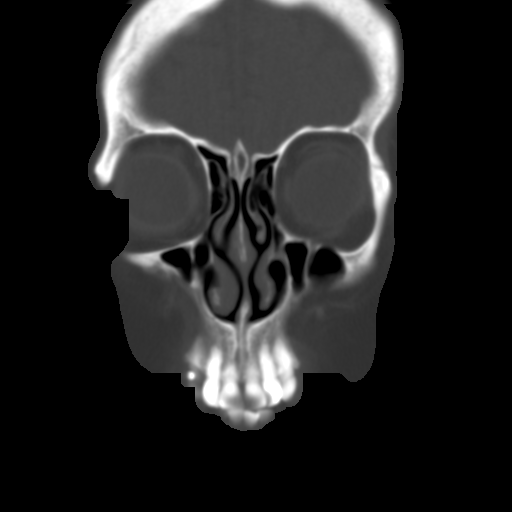
[im 13/15  bone]
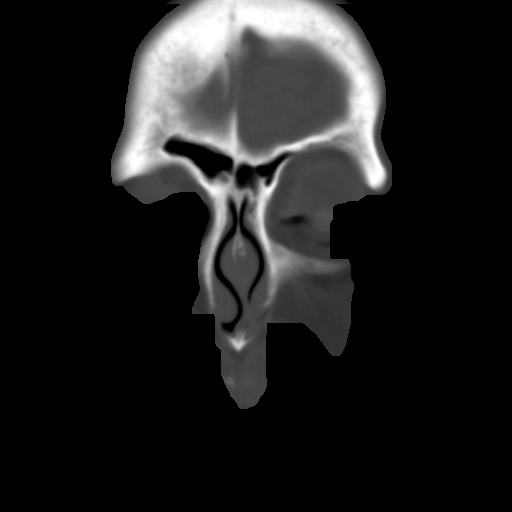
[im 14/15  brain]
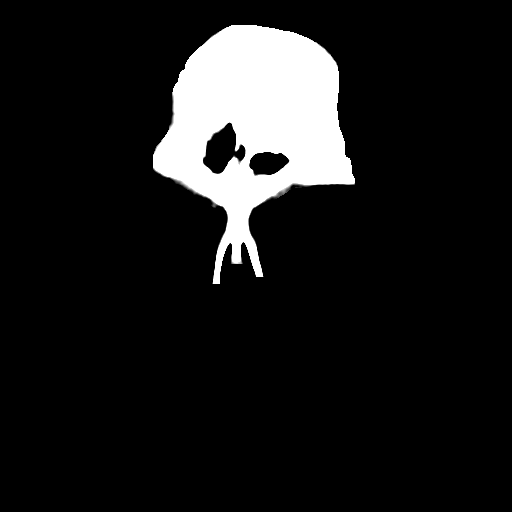
[im 14/15  bone]
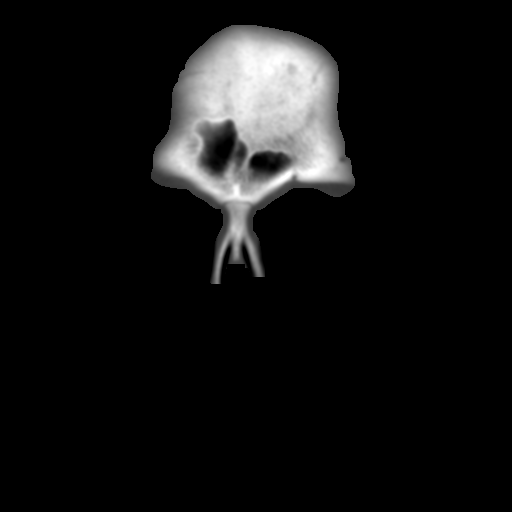

[13 of 15 positions shown; findings below may reference images not displayed]

FINDINGS: Coronal images of paranasal sinuses submitted. Metallic dental
artifact are noted. No paranasal sinuses mucosal thickening or
air-fluid levels. Mild deviation of inferior aspect of nasal bony
septum again noted. Nasal turbinates are unremarkable. Bilateral
semilunar canal is patent. Frontal sinuses are unremarkable
bilaterally.
IMPRESSION: No paranasal sinuses mucosal thickening or air-fluid levels. Mild
right deviation of nasal bony septum. Bilateral semilunar canal is
patent.

## 2018-12-19 ENCOUNTER — Other Ambulatory Visit: Payer: Self-pay | Admitting: Family Medicine

## 2018-12-19 DIAGNOSIS — M542 Cervicalgia: Secondary | ICD-10-CM

## 2018-12-19 DIAGNOSIS — G500 Trigeminal neuralgia: Secondary | ICD-10-CM

## 2018-12-19 DIAGNOSIS — M5136 Other intervertebral disc degeneration, lumbar region: Secondary | ICD-10-CM

## 2018-12-19 MED ORDER — HYDROCODONE-ACETAMINOPHEN 5-325 MG PO TABS: ORAL_TABLET | ORAL | 0 refills | Status: DC

## 2018-12-19 NOTE — Telephone Encounter (Signed)
Pt's husband advised.  

## 2019-01-01 ENCOUNTER — Encounter: Payer: Self-pay | Admitting: Family Medicine

## 2019-01-01 ENCOUNTER — Ambulatory Visit (INDEPENDENT_AMBULATORY_CARE_PROVIDER_SITE_OTHER): Payer: Medicare HMO | Admitting: Family Medicine

## 2019-01-01 VITALS — BP 130/78 | HR 80 | Temp 98.1°F | Resp 18 | Wt 135.0 lb

## 2019-01-01 DIAGNOSIS — E039 Hypothyroidism, unspecified: Secondary | ICD-10-CM | POA: Diagnosis not present

## 2019-01-01 DIAGNOSIS — G8929 Other chronic pain: Secondary | ICD-10-CM

## 2019-01-01 DIAGNOSIS — E785 Hyperlipidemia, unspecified: Secondary | ICD-10-CM

## 2019-01-01 DIAGNOSIS — G500 Trigeminal neuralgia: Secondary | ICD-10-CM | POA: Diagnosis not present

## 2019-01-01 DIAGNOSIS — M542 Cervicalgia: Secondary | ICD-10-CM | POA: Diagnosis not present

## 2019-01-01 DIAGNOSIS — M5136 Other intervertebral disc degeneration, lumbar region: Secondary | ICD-10-CM | POA: Diagnosis not present

## 2019-01-01 MED ORDER — HYDROCODONE-ACETAMINOPHEN 5-325 MG PO TABS: ORAL_TABLET | ORAL | 0 refills | Status: DC

## 2019-01-01 MED ORDER — TRIAMTERENE-HCTZ 37.5-25 MG PO TABS: 1.0000 | ORAL_TABLET | Freq: Every day | ORAL | 3 refills | Status: DC

## 2019-01-01 MED ORDER — OMEPRAZOLE 40 MG PO CPDR: 40.0000 mg | DELAYED_RELEASE_CAPSULE | Freq: Every day | ORAL | 3 refills | Status: DC

## 2019-01-01 NOTE — Progress Notes (Signed)
Telephone via Video Note  I connected with [name] on 01/01/19 at  1:20 PM EDT by a video enabled telemedicine application and verified that I am speaking with the correct person using two identifiers.   I discussed the limitations of evaluation and management by telemedicine and the availability of in person appointments. The patient expressed understanding and agreed to proceed.  Pt was at home and I was in my office for the virtual visit.      Subjective:    CC: 3 mo f/u chronic pain mgt.  HPI: 73 year old female comes in today for chronic pain management for degenerative lumbar disc disease and chronic neck pain-she is currently on hydrocodone for pain mgt. She is doing well on regimen. No flares in her pain.   Hypothyroidism - Taking medication regularly in the AM away from food and vitamins, etc. No recent change to skin, hair, or energy levels.  Hyperlipidemia - not a stating.    Past medical history, Surgical history, Family history not pertinant except as noted below, Social history, Allergies, and medications have been entered into the medical record, reviewed, and corrections made.   Review of Systems: No fevers, chills, night sweats, weight loss, chest pain, or shortness of breath.   Objective:    General: Speaking clearly in complete sentences without any shortness of breath.  Alert and oriented x3.  Normal judgment. No apparent acute distress.    Impression and Recommendations:   Chronic pain management for degenerative disc disease of the lumbar spine as well as chronic neck pain and trigeminal neuralgia. Will continue current regimen.   Indication for chronic opioid:Chronic neck and back pain  medication and dose:Hydrocodone 5/325. # pills per month:60 Last UDS date:10/03/18. Opioid Treatment Agreement signed (Y/N):Y Opioid Treatment Agreement last reviewed with patient:10/03/2018 NCCSRS reviewed this encounter (include red flags):Yes    Hyperlipidemia - due to recheck.    I discussed the assessment and treatment plan with the patient. The patient was provided an opportunity to ask questions and all were answered. The patient agreed with the plan and demonstrated an understanding of the instructions.   The patient was advised to call back or seek an in-person evaluation if the symptoms worsen or if the condition fails to improve as anticipated.  Time for encouter: 22 minutes.    Beatrice Lecher, MD

## 2019-01-10 ENCOUNTER — Telehealth: Payer: Self-pay | Admitting: Family Medicine

## 2019-01-10 DIAGNOSIS — G500 Trigeminal neuralgia: Secondary | ICD-10-CM | POA: Diagnosis not present

## 2019-01-10 DIAGNOSIS — G501 Atypical facial pain: Secondary | ICD-10-CM | POA: Diagnosis not present

## 2019-01-10 DIAGNOSIS — M810 Age-related osteoporosis without current pathological fracture: Secondary | ICD-10-CM

## 2019-01-10 NOTE — Telephone Encounter (Signed)
Test claim pays and does not indicate a prior authorization is needed for the requested medication/quantity under the patient's Part D prescription benefit. If you have questions or need further assistance, please call the number on the back of the patient's insurance card. (Prolia)  Received a fax from [facility] that patient will have an estimate of 20% and patient is agreeable to the estimate out of pocket. Labs ordered. Patient is aware we will call her once labs are completed to schedule a nurse visit.

## 2019-01-22 ENCOUNTER — Other Ambulatory Visit: Payer: Self-pay | Admitting: Family Medicine

## 2019-01-22 DIAGNOSIS — M810 Age-related osteoporosis without current pathological fracture: Secondary | ICD-10-CM | POA: Diagnosis not present

## 2019-01-23 ENCOUNTER — Other Ambulatory Visit: Payer: Self-pay | Admitting: Family Medicine

## 2019-01-23 LAB — COMPLETE METABOLIC PANEL WITH GFR
AG Ratio: 2.3 (calc) (ref 1.0–2.5)
ALT: 12 U/L (ref 6–29)
AST: 24 U/L (ref 10–35)
Albumin: 4.8 g/dL (ref 3.6–5.1)
Alkaline phosphatase (APISO): 30 U/L — ABNORMAL LOW (ref 37–153)
BUN: 19 mg/dL (ref 7–25)
CO2: 30 mmol/L (ref 20–32)
Calcium: 10.1 mg/dL (ref 8.6–10.4)
Chloride: 99 mmol/L (ref 98–110)
Creat: 0.89 mg/dL (ref 0.60–0.93)
GFR, Est African American: 75 mL/min/{1.73_m2} (ref >=60)
GFR, Est Non African American: 65 mL/min/{1.73_m2} (ref >=60)
Globulin: 2.1 g/dL (calc) (ref 1.9–3.7)
Glucose, Bld: 94 mg/dL (ref 65–139)
Potassium: 4.2 mmol/L (ref 3.5–5.3)
Sodium: 139 mmol/L (ref 135–146)
Total Bilirubin: 0.5 mg/dL (ref 0.2–1.2)
Total Protein: 6.9 g/dL (ref 6.1–8.1)

## 2019-01-23 NOTE — Progress Notes (Signed)
All labs are normal. 

## 2019-02-01 ENCOUNTER — Ambulatory Visit: Payer: Medicare HMO | Admitting: Hematology and Oncology

## 2019-02-05 DIAGNOSIS — J329 Chronic sinusitis, unspecified: Secondary | ICD-10-CM | POA: Diagnosis not present

## 2019-02-05 DIAGNOSIS — J3089 Other allergic rhinitis: Secondary | ICD-10-CM | POA: Diagnosis not present

## 2019-02-08 DIAGNOSIS — G500 Trigeminal neuralgia: Secondary | ICD-10-CM | POA: Diagnosis not present

## 2019-02-08 DIAGNOSIS — G3184 Mild cognitive impairment, so stated: Secondary | ICD-10-CM | POA: Diagnosis not present

## 2019-02-08 DIAGNOSIS — G4700 Insomnia, unspecified: Secondary | ICD-10-CM | POA: Diagnosis not present

## 2019-02-08 DIAGNOSIS — J309 Allergic rhinitis, unspecified: Secondary | ICD-10-CM | POA: Diagnosis not present

## 2019-02-08 DIAGNOSIS — E039 Hypothyroidism, unspecified: Secondary | ICD-10-CM | POA: Diagnosis not present

## 2019-02-08 DIAGNOSIS — E785 Hyperlipidemia, unspecified: Secondary | ICD-10-CM | POA: Diagnosis not present

## 2019-02-08 DIAGNOSIS — K219 Gastro-esophageal reflux disease without esophagitis: Secondary | ICD-10-CM | POA: Diagnosis not present

## 2019-02-08 DIAGNOSIS — H409 Unspecified glaucoma: Secondary | ICD-10-CM | POA: Diagnosis not present

## 2019-02-08 DIAGNOSIS — C50919 Malignant neoplasm of unspecified site of unspecified female breast: Secondary | ICD-10-CM | POA: Diagnosis not present

## 2019-02-08 DIAGNOSIS — G8929 Other chronic pain: Secondary | ICD-10-CM | POA: Diagnosis not present

## 2019-02-14 DIAGNOSIS — H401131 Primary open-angle glaucoma, bilateral, mild stage: Secondary | ICD-10-CM | POA: Diagnosis not present

## 2019-02-14 DIAGNOSIS — H524 Presbyopia: Secondary | ICD-10-CM | POA: Diagnosis not present

## 2019-02-15 DIAGNOSIS — Z0100 Encounter for examination of eyes and vision without abnormal findings: Secondary | ICD-10-CM | POA: Diagnosis not present

## 2019-02-22 ENCOUNTER — Other Ambulatory Visit: Payer: Self-pay

## 2019-02-22 DIAGNOSIS — M542 Cervicalgia: Secondary | ICD-10-CM

## 2019-02-22 DIAGNOSIS — G500 Trigeminal neuralgia: Secondary | ICD-10-CM

## 2019-02-22 DIAGNOSIS — M5136 Other intervertebral disc degeneration, lumbar region: Secondary | ICD-10-CM

## 2019-02-22 MED ORDER — HYDROCODONE-ACETAMINOPHEN 5-325 MG PO TABS: ORAL_TABLET | ORAL | 0 refills | Status: DC

## 2019-03-07 ENCOUNTER — Ambulatory Visit
Admission: RE | Admit: 2019-03-07 | Discharge: 2019-03-07 | Disposition: A | Discharge status: Home / Self Care | Payer: Medicare HMO | Source: Ambulatory Visit | Attending: Family Medicine | Admitting: Family Medicine

## 2019-03-07 ENCOUNTER — Other Ambulatory Visit: Payer: Self-pay

## 2019-03-07 DIAGNOSIS — Z1231 Encounter for screening mammogram for malignant neoplasm of breast: Secondary | ICD-10-CM

## 2019-03-08 DIAGNOSIS — Z79810 Long term (current) use of selective estrogen receptor modulators (SERMs): Secondary | ICD-10-CM | POA: Diagnosis not present

## 2019-03-08 DIAGNOSIS — C771 Secondary and unspecified malignant neoplasm of intrathoracic lymph nodes: Secondary | ICD-10-CM | POA: Diagnosis not present

## 2019-03-08 DIAGNOSIS — Z170 Estrogen receptor positive status [ER+]: Secondary | ICD-10-CM | POA: Diagnosis not present

## 2019-03-08 DIAGNOSIS — Z5181 Encounter for therapeutic drug level monitoring: Secondary | ICD-10-CM | POA: Diagnosis not present

## 2019-03-08 DIAGNOSIS — Z853 Personal history of malignant neoplasm of breast: Secondary | ICD-10-CM | POA: Diagnosis not present

## 2019-03-08 DIAGNOSIS — T451X5A Adverse effect of antineoplastic and immunosuppressive drugs, initial encounter: Secondary | ICD-10-CM | POA: Diagnosis not present

## 2019-03-08 DIAGNOSIS — M818 Other osteoporosis without current pathological fracture: Secondary | ICD-10-CM | POA: Diagnosis not present

## 2019-03-08 DIAGNOSIS — Z90710 Acquired absence of both cervix and uterus: Secondary | ICD-10-CM | POA: Diagnosis not present

## 2019-03-08 DIAGNOSIS — Z08 Encounter for follow-up examination after completed treatment for malignant neoplasm: Secondary | ICD-10-CM | POA: Diagnosis not present

## 2019-03-08 DIAGNOSIS — C50512 Malignant neoplasm of lower-outer quadrant of left female breast: Secondary | ICD-10-CM | POA: Diagnosis not present

## 2019-03-08 DIAGNOSIS — Z842 Family history of other diseases of the genitourinary system: Secondary | ICD-10-CM | POA: Diagnosis not present

## 2019-03-08 DIAGNOSIS — T386X5A Adverse effect of antigonadotrophins, antiestrogens, antiandrogens, not elsewhere classified, initial encounter: Secondary | ICD-10-CM | POA: Diagnosis not present

## 2019-03-08 DIAGNOSIS — N952 Postmenopausal atrophic vaginitis: Secondary | ICD-10-CM | POA: Diagnosis not present

## 2019-03-12 DIAGNOSIS — N3020 Other chronic cystitis without hematuria: Secondary | ICD-10-CM | POA: Diagnosis not present

## 2019-03-12 DIAGNOSIS — R300 Dysuria: Secondary | ICD-10-CM | POA: Diagnosis not present

## 2019-03-27 DIAGNOSIS — R69 Illness, unspecified: Secondary | ICD-10-CM | POA: Diagnosis not present

## 2019-03-28 ENCOUNTER — Other Ambulatory Visit: Payer: Self-pay

## 2019-03-28 DIAGNOSIS — G500 Trigeminal neuralgia: Secondary | ICD-10-CM

## 2019-03-28 DIAGNOSIS — M5136 Other intervertebral disc degeneration, lumbar region: Secondary | ICD-10-CM

## 2019-03-28 DIAGNOSIS — M542 Cervicalgia: Secondary | ICD-10-CM

## 2019-03-28 MED ORDER — HYDROCODONE-ACETAMINOPHEN 5-325 MG PO TABS: ORAL_TABLET | ORAL | 0 refills | Status: DC

## 2019-03-28 NOTE — Telephone Encounter (Signed)
Requesting RF on Norco   Last RX written 02/22/19  Last OV 01/01/19

## 2019-04-03 ENCOUNTER — Ambulatory Visit: Payer: Medicare HMO | Admitting: Family Medicine

## 2019-04-09 ENCOUNTER — Encounter: Payer: Self-pay | Admitting: Family Medicine

## 2019-04-09 ENCOUNTER — Ambulatory Visit (INDEPENDENT_AMBULATORY_CARE_PROVIDER_SITE_OTHER): Payer: Medicare HMO | Admitting: Family Medicine

## 2019-04-09 ENCOUNTER — Other Ambulatory Visit: Payer: Self-pay

## 2019-04-09 VITALS — BP 132/71 | HR 90 | Ht 65.0 in | Wt 135.0 lb

## 2019-04-09 DIAGNOSIS — M810 Age-related osteoporosis without current pathological fracture: Secondary | ICD-10-CM | POA: Diagnosis not present

## 2019-04-09 DIAGNOSIS — F0390 Unspecified dementia without behavioral disturbance: Secondary | ICD-10-CM | POA: Insufficient documentation

## 2019-04-09 DIAGNOSIS — E079 Disorder of thyroid, unspecified: Secondary | ICD-10-CM | POA: Diagnosis not present

## 2019-04-09 DIAGNOSIS — T386X5A Adverse effect of antigonadotrophins, antiestrogens, antiandrogens, not elsewhere classified, initial encounter: Secondary | ICD-10-CM

## 2019-04-09 DIAGNOSIS — G8929 Other chronic pain: Secondary | ICD-10-CM | POA: Diagnosis not present

## 2019-04-09 DIAGNOSIS — E039 Hypothyroidism, unspecified: Secondary | ICD-10-CM | POA: Diagnosis not present

## 2019-04-09 DIAGNOSIS — E785 Hyperlipidemia, unspecified: Secondary | ICD-10-CM

## 2019-04-09 DIAGNOSIS — M5030 Other cervical disc degeneration, unspecified cervical region: Secondary | ICD-10-CM | POA: Diagnosis not present

## 2019-04-09 DIAGNOSIS — R413 Other amnesia: Secondary | ICD-10-CM

## 2019-04-09 DIAGNOSIS — M818 Other osteoporosis without current pathological fracture: Secondary | ICD-10-CM

## 2019-04-09 DIAGNOSIS — F0280 Dementia in other diseases classified elsewhere without behavioral disturbance: Secondary | ICD-10-CM | POA: Insufficient documentation

## 2019-04-09 DIAGNOSIS — E782 Mixed hyperlipidemia: Secondary | ICD-10-CM

## 2019-04-09 DIAGNOSIS — G3184 Mild cognitive impairment, so stated: Secondary | ICD-10-CM | POA: Insufficient documentation

## 2019-04-09 NOTE — Assessment & Plan Note (Signed)
So far doing OK on pravastatin. Due to repeat lipids.

## 2019-04-09 NOTE — Assessment & Plan Note (Signed)
Currently on Prolia.

## 2019-04-09 NOTE — Progress Notes (Signed)
Established Patient Office Visit  Subjective:  Patient ID: [name], female    DOB: 15-Feb-1946  Age: 73 y.o. MRN: [id number]  CC:  Chief Complaint  Patient presents with  . pain management    HPI [name] presents for chronic pain management for degenerative lumbar disc disease and chronic neck pain-she is currently on hydrocodone for pain mgt. She is doing well on regimen. No flares in her pain.  she is doing well overall and takes her medication daily.    She did want to know when flu shots would be coming out as she does usually get that yearly.  She says she is also concerned about her memory she had mentioned it briefly the last time she was here but says that she is noticed that she is having more difficulty with her short-term memory.  She says overall functioning at home and with family she has not noticed any significant problems.  I tried to get her to give me very specific examples of things that she is may be giving difficulty with such as remembering names her appointments or conversations etc.  She says she really was not sure she just could not describe it.  But she did feel like it was a little bit more short-term memory.  She denies any other neurologic symptoms headaches etc.  She says that her mother did have dementia later in life.  Hypothyroidism - Taking medication regularly in the AM away from food and vitamins, etc. No recent change to skin, hair, or energy levels.   Past Medical History:  Diagnosis Date  . Breast cancer ([place]) 1999   T1N0 LEFT BREAST  . 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 Degenerative disc disease   . History of bladder infections    RECURRENT  . Hyperlipidemia   . Osteoporosis 03-04-2011    LAST BONE DENSITY 03-04-2011  . Recurrent sinusitis   . Thyroid disease   . Trigeminal neuralgia     Past Surgical History:  Procedure Laterality Date  . ABDOMINAL HYSTERECTOMY  2005    Complete  . BLADDER SUSPENSION  2005  . BREAST LUMPECTOMY Left 1999   malignant  . BREAST SURGERY  1999   Lumpectomy L Br  . BREAST SURGERY  2007   Lymph Node Removal L Chest Wall  . cancerous LN removed  2008  . left breast lumpectomy  1999    Family History  Problem Relation Age of Onset  . Cancer Mother        breast  . Stroke Mother   . Hyperlipidemia Mother   . Cancer Father        bladder  . Colon cancer Father   . Cancer Paternal Grandmother        Mouth  . Cancer Son        Breast  . Cancer Maternal Aunt        Breast  . Stomach cancer Paternal Uncle   . Stomach cancer Paternal Uncle     Social History   Socioeconomic History  . Marital status: Married    Spouse name: [name]   . Number of children: 3  . Years of education: Therapist, sports  . Highest education level: Not on file  Occupational History    Comment: Retired- Therapist, sports  Social Needs  . Financial resource strain: Not on file  . Food insecurity    Worry: Not on file  Inability: Not on file  . Transportation needs    Medical: Not on file    Non-medical: Not on file  Tobacco Use  . Smoking status: Never Smoker  . Smokeless tobacco: Never Used  Substance and Sexual Activity  . Alcohol use: No    Alcohol/week: 0.0 standard drinks  . Drug use: No  . Sexual activity: Not on file    Comment: housewife, married, 3 kids.  Lifestyle  . Physical activity    Days per week: Not on file    Minutes per session: Not on file  . Stress: Not on file  Relationships  . Social Herbalist on phone: Not on file    Gets together: Not on file    Attends religious service: Not on file    Active member of club or organization: Not on file    Attends meetings of clubs or organizations: Not on file    Relationship status: Not on file  . Intimate partner violence    Fear of current or ex partner: Not on file    Emotionally abused: Not on file    Physically abused: Not on file    Forced sexual activity: Not on file   Other Topics Concern  . Not on file  Social History Narrative   Patient lives at home with her husband [name])   Retired - RN   Right handed.   Caffeine two cups coffee daily and one sweet tea.    Outpatient Medications Prior to Visit  Medication Sig Dispense Refill  . azelastine (ASTELIN) 0.1 % nasal spray Place 2 sprays into both nostrils daily.    . Calcium-Magnesium-Vitamin D (CALCIUM 1200+D3 PO) Take by mouth daily.     Marland Kitchen denosumab (PROLIA) 60 MG/ML SOLN injection Inject 60 mg into the skin every 6 (six) months. Administer in upper arm, thigh, or abdomen    . EPINEPHrine (EPIPEN 2-PAK) 0.3 mg/0.3 mL IJ SOAJ injection Inject 0.3 mls (0.3 mg total) into the muscles AS NEEDED (allergic reaction) 2 Device prn  . fish oil-omega-3 fatty acids 1000 MG capsule Take 2 g by mouth daily.      . fluticasone (FLONASE) 50 MCG/ACT nasal spray Place 1 spray into both nostrils daily.  1  . HYDROcodone-acetaminophen (NORCO/VICODIN) 5-325 MG tablet TAKE 1 TABLET EVERY TWELVE HOURS AS NEEDED 60 tablet 0  . latanoprost (XALATAN) 0.005 % ophthalmic solution     . levocetirizine (XYZAL) 5 MG tablet Take 5 mg by mouth daily.      Marland Kitchen levothyroxine (SYNTHROID) 50 MCG tablet TAKE 1 TABLET (50 MCG TOTAL) BY MOUTH DAILY. LABS NEEDED BEFORE NEXT REFILL. 90 tablet 0  . montelukast (SINGULAIR) 10 MG tablet     . omeprazole (PRILOSEC) 40 MG capsule Take 1 capsule (40 mg total) by mouth daily. 90 capsule 3  . pravastatin (PRAVACHOL) 40 MG tablet Take 1 tablet (40 mg total) by mouth daily. 90 tablet 3  . senna-docusate (SENOKOT-S) 8.6-50 MG tablet Take 2 tablets by mouth at bedtime.    . tamoxifen (NOLVADEX) 20 MG tablet Take 1 tablet (20 mg total) by mouth daily. 90 tablet 3  . triamterene-hydrochlorothiazide (MAXZIDE-25) 37.5-25 MG tablet Take 1 tablet by mouth daily. 90 tablet 3  . trimethoprim (TRIMPEX) 100 MG tablet Take 100 mg by mouth daily.    . carbamazepine (TEGRETOL XR) 100 MG 12 hr tablet Take 1 tablet  (100 mg total) by mouth 2 (two) times daily. 180 tablet 3  No facility-administered medications prior to visit.     Allergies  Allergen Reactions  . Simvastatin Other (See Comments)    Aches and memory loss  . Azithromycin Hives  . Doxycycline Itching  . Other Rash  . Trazodone And Nefazodone Other (See Comments)    Memory impairment and palpitations.   . Ampicillin Rash  . Cefadroxil Rash  . Dicloxacillin Rash    ROS Review of Systems    Objective:    Physical Exam  BP 132/71   Pulse 90   Ht '5\' 5"'  (1.651 m)   Wt 135 lb (61.2 kg)   SpO2 99%   BMI 22.47 kg/m  Wt Readings from Last 3 Encounters:  04/09/19 135 lb (61.2 kg)  01/01/19 135 lb (61.2 kg)  10/03/18 135 lb (61.2 kg)     Health Maintenance Due  Topic Date Due  . Hepatitis C Screening  11-Oct-1945    There are no preventive care reminders to display for this patient.  Lab Results  Component Value Date   TSH 2.46 07/03/2018   Lab Results  Component Value Date   WBC 3.9 04/04/2017   HGB 12.3 04/04/2017   HCT 37.2 04/04/2017   MCV 93.9 04/04/2017   PLT 207 04/04/2017   Lab Results  Component Value Date   NA 139 01/22/2019   K 4.2 01/22/2019   CHLORIDE 103 01/31/2017   CO2 30 01/22/2019   GLUCOSE 94 01/22/2019   BUN 19 01/22/2019   CREATININE 0.89 01/22/2019   BILITOT 0.5 01/22/2019   ALKPHOS 29 (L) 04/04/2017   AST 24 01/22/2019   ALT 12 01/22/2019   PROT 6.9 01/22/2019   ALBUMIN 4.8 04/04/2017   CALCIUM 10.1 01/22/2019   ANIONGAP 9 01/31/2017   EGFR 54 (L) 01/31/2017   Lab Results  Component Value Date   CHOL 230 (H) 01/29/2018   Lab Results  Component Value Date   HDL 82 01/29/2018   Lab Results  Component Value Date   LDLCALC 120 (H) 01/29/2018   Lab Results  Component Value Date   TRIG 168 (H) 01/29/2018   Lab Results  Component Value Date   CHOLHDL 2.8 01/29/2018   No results found for: HGBA1C    Assessment & Plan:   Problem List Items Addressed This Visit       Endocrine   Thyroid disease    Currently asymptomatic.  Due to recheck TSH today.      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 She really had difficulty giving me specific examples.  Will monitor and check again in 6 mo.       HYPERLIPIDEMIA, MIXED    So far doing OK on pravastatin. Due to repeat lipids.      Encounter for chronic pain management - Primary    Indication for chronic opioid:Chronic neck and back pain  medication and dose:Hydrocodone 5/325. # pills per month:60 Last UDS date:10/03/18. Opioid Treatment Agreement signed (Y/N):Y Opioid Treatment Agreement last reviewed with patient:10/03/2018 F/U in 3 mo.  NCCSRS reviewed this encounter (include red flags):Yes  Relevant Orders   Pain Mgmt, Profile 5 w/o medMatch U    Other Visit Diagnoses    Hyperlipidemia, unspecified hyperlipidemia type       Relevant Orders   Lipid panel      No orders of the defined types were placed in this encounter.   Follow-up: Return in about 3 months (around 07/10/2019) for chronic pain management. Beatrice Lecher, MD

## 2019-04-09 NOTE — Assessment & Plan Note (Signed)
Currently asymptomatic.  Due to recheck TSH today.

## 2019-04-09 NOTE — Assessment & Plan Note (Signed)
Did have her complete a Mini-Mental status exam today. She really had difficulty giving me specific examples.  Will monitor and check again in 6 mo.

## 2019-04-09 NOTE — Patient Instructions (Signed)
We should get our flu shot in sometime in the next 2 weeks. Please call and schedule a nurse visit for your flu shot.

## 2019-04-09 NOTE — Assessment & Plan Note (Signed)
Indication for chronic opioid:Chronic neck and back pain  medication and dose:Hydrocodone 5/325. # pills per month:60 Last UDS date:10/03/18. Opioid Treatment Agreement signed (Y/N):Y Opioid Treatment Agreement last reviewed with patient:10/03/2018 F/U in 3 mo.  NCCSRS reviewed this encounter (include red flags):Yes

## 2019-04-10 ENCOUNTER — Other Ambulatory Visit: Payer: Self-pay | Admitting: Family Medicine

## 2019-04-10 DIAGNOSIS — E039 Hypothyroidism, unspecified: Secondary | ICD-10-CM

## 2019-04-10 LAB — PAIN MGMT, PROFILE 5 W/O MEDMATCH U
Amphetamines: NEGATIVE ng/mL
Barbiturates: NEGATIVE ng/mL
Benzodiazepines: NEGATIVE ng/mL
Cocaine Metabolite: NEGATIVE ng/mL
Creatinine: 86.3 mg/dL
Marijuana Metabolite: NEGATIVE ng/mL
Methadone Metabolite: NEGATIVE ng/mL
Opiates: NEGATIVE ng/mL
Oxidant: NEGATIVE ug/mL
Oxycodone: NEGATIVE ng/mL
pH: 7.6 (ref 4.5–9.0)

## 2019-04-10 LAB — TSH: TSH: 3.2 mIU/L (ref 0.40–4.50)

## 2019-04-10 LAB — LIPID PANEL
Cholesterol: 289 mg/dL — ABNORMAL HIGH (ref <200)
HDL: 81 mg/dL (ref >=50)
LDL Cholesterol (Calc): 169 mg/dL (calc) — ABNORMAL HIGH
Non-HDL Cholesterol (Calc): 208 mg/dL (calc) — ABNORMAL HIGH (ref <130)
Total CHOL/HDL Ratio: 3.6 (calc) (ref <5.0)
Triglycerides: 216 mg/dL — ABNORMAL HIGH (ref <150)

## 2019-04-10 NOTE — Progress Notes (Signed)
Patient spouse verbalized and will have the patient take the medications as directed by PCP.  She will come back to the lab in 6 weeks to get TSH checked.

## 2019-04-10 NOTE — Progress Notes (Signed)
I called and spoke with patient spouse and he voices understanding. He repeated back the message and it was clear. No further questions during the call. -HSM

## 2019-04-16 ENCOUNTER — Other Ambulatory Visit: Payer: Self-pay | Admitting: Family Medicine

## 2019-04-23 DIAGNOSIS — R69 Illness, unspecified: Secondary | ICD-10-CM | POA: Diagnosis not present

## 2019-04-29 DIAGNOSIS — R69 Illness, unspecified: Secondary | ICD-10-CM | POA: Diagnosis not present

## 2019-05-02 ENCOUNTER — Other Ambulatory Visit: Payer: Self-pay

## 2019-05-02 DIAGNOSIS — M542 Cervicalgia: Secondary | ICD-10-CM

## 2019-05-02 DIAGNOSIS — M5136 Other intervertebral disc degeneration, lumbar region: Secondary | ICD-10-CM

## 2019-05-02 DIAGNOSIS — G500 Trigeminal neuralgia: Secondary | ICD-10-CM

## 2019-05-02 MED ORDER — HYDROCODONE-ACETAMINOPHEN 5-325 MG PO TABS: ORAL_TABLET | ORAL | 0 refills | Status: DC

## 2019-05-02 NOTE — Telephone Encounter (Signed)
Requesting RF on Norco  Last written 03/28/19  Last OV 04/09/19  RX pended

## 2019-05-03 DIAGNOSIS — D229 Melanocytic nevi, unspecified: Secondary | ICD-10-CM | POA: Diagnosis not present

## 2019-05-03 DIAGNOSIS — L821 Other seborrheic keratosis: Secondary | ICD-10-CM | POA: Diagnosis not present

## 2019-05-03 DIAGNOSIS — Z85828 Personal history of other malignant neoplasm of skin: Secondary | ICD-10-CM | POA: Diagnosis not present

## 2019-05-03 DIAGNOSIS — L814 Other melanin hyperpigmentation: Secondary | ICD-10-CM | POA: Diagnosis not present

## 2019-06-11 ENCOUNTER — Other Ambulatory Visit: Payer: Self-pay

## 2019-06-11 DIAGNOSIS — M542 Cervicalgia: Secondary | ICD-10-CM

## 2019-06-11 DIAGNOSIS — G500 Trigeminal neuralgia: Secondary | ICD-10-CM

## 2019-06-11 DIAGNOSIS — M5136 Other intervertebral disc degeneration, lumbar region: Secondary | ICD-10-CM

## 2019-06-11 MED ORDER — HYDROCODONE-ACETAMINOPHEN 5-325 MG PO TABS: ORAL_TABLET | ORAL | 0 refills | Status: DC

## 2019-06-14 DIAGNOSIS — N3020 Other chronic cystitis without hematuria: Secondary | ICD-10-CM | POA: Diagnosis not present

## 2019-07-10 ENCOUNTER — Encounter: Payer: Self-pay | Admitting: Family Medicine

## 2019-07-10 ENCOUNTER — Other Ambulatory Visit: Payer: Self-pay

## 2019-07-10 ENCOUNTER — Ambulatory Visit (INDEPENDENT_AMBULATORY_CARE_PROVIDER_SITE_OTHER): Payer: Medicare HMO | Admitting: Family Medicine

## 2019-07-10 VITALS — BP 127/71 | HR 84 | Ht 67.0 in | Wt 134.0 lb

## 2019-07-10 DIAGNOSIS — M542 Cervicalgia: Secondary | ICD-10-CM

## 2019-07-10 DIAGNOSIS — G3184 Mild cognitive impairment, so stated: Secondary | ICD-10-CM

## 2019-07-10 DIAGNOSIS — G500 Trigeminal neuralgia: Secondary | ICD-10-CM

## 2019-07-10 DIAGNOSIS — G8929 Other chronic pain: Secondary | ICD-10-CM | POA: Diagnosis not present

## 2019-07-10 DIAGNOSIS — E039 Hypothyroidism, unspecified: Secondary | ICD-10-CM

## 2019-07-10 DIAGNOSIS — M810 Age-related osteoporosis without current pathological fracture: Secondary | ICD-10-CM | POA: Diagnosis not present

## 2019-07-10 DIAGNOSIS — M5136 Other intervertebral disc degeneration, lumbar region: Secondary | ICD-10-CM

## 2019-07-10 MED ORDER — LEVOTHYROXINE SODIUM 50 MCG PO TABS: 50.0000 ug | ORAL_TABLET | Freq: Every day | ORAL | 0 refills | Status: DC

## 2019-07-10 MED ORDER — HYDROCODONE-ACETAMINOPHEN 5-325 MG PO TABS: ORAL_TABLET | ORAL | 0 refills | Status: DC

## 2019-07-10 NOTE — Assessment & Plan Note (Signed)
Indication for chronic opioid:Chronic neck and back pain  medication and dose:Hydrocodone 5/325. # pills per month:60 Last UDS date:8/20. Opioid Treatment Agreement signed (Y/N):Y Opioid Treatment Agreement last reviewed with patient:10/03/2018 F/U in 3 mo.  NCCSRS reviewed this encounter (include red flags):Yes

## 2019-07-10 NOTE — Assessment & Plan Note (Signed)
Very concerned during our office visit today because she actually repeated herself multiple times which was a little bit unlike her.  In fact we have just done a Mini-Mental status exam on her in August and her score was a 27.  We repeated the Mini-Mental status exam and the score was 22 out of 30.  I am concerned about a potential rapid progression in memory.  I would really like her to follow-up in a couple weeks so that we can go over this in more detail.  When I asked her if she felt like she was having memory problems she said no I really like for her husband to come so that we can discuss this and discuss additional work-up including possible imaging and blood work.

## 2019-07-10 NOTE — Assessment & Plan Note (Signed)
Well on Prolia every 6 months.

## 2019-07-10 NOTE — Progress Notes (Signed)
Established Patient Office Visit  Subjective:  Patient ID: [name], female    DOB: October 19, 1945  Age: 73 y.o. MRN: [id number]  CC:  Chief Complaint  Patient presents with  . chronic pain management    HPI [name] presents for chronic pain management for degenerative disc of the cervical and lumbar spine.  She says she has not been doing her stretches regularly does try to walk some.  She says the hydrocodone seems to be working well and has not had any problems.  Feels like her bowels are moving well denies constipation.  Hypothyroidism - Taking medication regularly in the AM away from food and vitamins, etc. No recent change to skin, hair, or energy levels. We increased her dose to take an extra half a tab a week and due to recheck her thyroid.  She says she never increased her dose so she still only taking 1 tab daily.  Did let me know that she has [facility].  Unfortunately she has been getting recurrent UTIs and feels like she just cannot quite get rid of them.  She says she is currently on antibiotics.  Past Medical History:  Diagnosis Date  . Breast cancer ([place]) 1999   T1N0 LEFT BREAST  . 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 Degenerative disc disease   . History of bladder infections    RECURRENT  . Hyperlipidemia   . Osteoporosis 03-04-2011    LAST BONE DENSITY 03-04-2011  . Recurrent sinusitis   . Thyroid disease   . Trigeminal neuralgia     Past Surgical History:  Procedure Laterality Date  . ABDOMINAL HYSTERECTOMY  2005   Complete  . BLADDER SUSPENSION  2005  . BREAST LUMPECTOMY Left 1999   malignant  . BREAST SURGERY  1999   Lumpectomy L Br  . BREAST SURGERY  2007   Lymph Node Removal L Chest Wall  . cancerous LN removed  2008  . left breast lumpectomy  1999    Family History  Problem Relation Age of Onset  . Cancer Mother        breast  . Stroke Mother   .  Hyperlipidemia Mother   . Cancer Father        bladder  . Colon cancer Father   . Cancer Paternal Grandmother        Mouth  . Cancer Son        Breast  . Cancer Maternal Aunt        Breast  . Stomach cancer Paternal Uncle   . Stomach cancer Paternal Uncle     Social History   Socioeconomic History  . Marital status: Married    Spouse name: [name]   . Number of children: 3  . Years of education: Therapist, sports  . Highest education level: Not on file  Occupational History    Comment: Retired- Therapist, sports  Social Needs  . Financial resource strain: Not on file  . Food insecurity    Worry: Not on file    Inability: Not on file  . Transportation needs    Medical: Not on file    Non-medical: Not on file  Tobacco Use  . Smoking status: Never Smoker  . Smokeless tobacco: Never Used  Substance and Sexual Activity  . Alcohol use: No    Alcohol/week: 0.0 standard drinks  . Drug use: No  . Sexual activity: Not on file  Comment: housewife, married, 3 kids.  Lifestyle  . Physical activity    Days per week: Not on file    Minutes per session: Not on file  . Stress: Not on file  Relationships  . Social Herbalist on phone: Not on file    Gets together: Not on file    Attends religious service: Not on file    Active member of club or organization: Not on file    Attends meetings of clubs or organizations: Not on file    Relationship status: Not on file  . Intimate partner violence    Fear of current or ex partner: Not on file    Emotionally abused: Not on file    Physically abused: Not on file    Forced sexual activity: Not on file  Other Topics Concern  . Not on file  Social History Narrative   Patient lives at home with her husband [name])   Retired - RN   Right handed.   Caffeine two cups coffee daily and one sweet tea.    Outpatient Medications Prior to Visit  Medication Sig Dispense Refill  . azelastine (ASTELIN) 0.1 % nasal spray Place 2 sprays into both nostrils  daily.    . Calcium-Magnesium-Vitamin D (CALCIUM 1200+D3 PO) Take by mouth daily.     . carbamazepine (TEGRETOL XR) 100 MG 12 hr tablet Take 100 mg by mouth 2 (two) times daily.    Marland Kitchen denosumab (PROLIA) 60 MG/ML SOLN injection Inject 60 mg into the skin every 6 (six) months. Administer in upper arm, thigh, or abdomen    . EPINEPHrine (EPIPEN 2-PAK) 0.3 mg/0.3 mL IJ SOAJ injection Inject 0.3 mls (0.3 mg total) into the muscles AS NEEDED (allergic reaction) 2 Device prn  . fish oil-omega-3 fatty acids 1000 MG capsule Take 2 g by mouth daily.      . fluticasone (FLONASE) 50 MCG/ACT nasal spray Place 1 spray into both nostrils daily.  1  . latanoprost (XALATAN) 0.005 % ophthalmic solution     . levocetirizine (XYZAL) 5 MG tablet Take 5 mg by mouth daily.      . montelukast (SINGULAIR) 10 MG tablet     . omeprazole (PRILOSEC) 40 MG capsule Take 1 capsule (40 mg total) by mouth daily. 90 capsule 3  . pravastatin (PRAVACHOL) 40 MG tablet Take 1 tablet (40 mg total) by mouth daily. 90 tablet 3  . senna-docusate (SENOKOT-S) 8.6-50 MG tablet Take 2 tablets by mouth at bedtime.    . tamoxifen (NOLVADEX) 20 MG tablet Take 1 tablet (20 mg total) by mouth daily. 90 tablet 3  . triamterene-hydrochlorothiazide (MAXZIDE-25) 37.5-25 MG tablet Take 1 tablet by mouth daily. 90 tablet 3  . trimethoprim (TRIMPEX) 100 MG tablet Take 100 mg by mouth daily.    Marland Kitchen HYDROcodone-acetaminophen (NORCO/VICODIN) 5-325 MG tablet TAKE 1 TABLET EVERY TWELVE HOURS AS NEEDED 60 tablet 0  . levothyroxine (SYNTHROID) 50 MCG tablet TAKE 1 TABLET (50 MCG TOTAL) BY MOUTH DAILY. LABS NEEDED BEFORE NEXT REFILL. 90 tablet 0   No facility-administered medications prior to visit.     Allergies  Allergen Reactions  . Simvastatin Other (See Comments)    Aches and memory loss  . Azithromycin Hives  . Doxycycline Itching  . Other Rash  . Trazodone And Nefazodone Other (See Comments)    Memory impairment and palpitations.   . Ampicillin  Rash  . Cefadroxil Rash  . Dicloxacillin Rash    ROS Review of  Systems    Objective:    Physical Exam  Constitutional: She is oriented to person, place, and time. She appears well-developed and well-nourished.  HENT:  Head: Normocephalic and atraumatic.  Cardiovascular: Normal rate, regular rhythm and normal heart sounds.  Pulmonary/Chest: Effort normal and breath sounds normal.  Neurological: She is alert and oriented to person, place, and time.  Skin: Skin is warm and dry.  Psychiatric: She has a normal mood and affect. Her behavior is normal.    BP 127/71   Pulse 84   Ht '5\' 7"'  (1.702 m)   Wt 134 lb (60.8 kg)   SpO2 96%   BMI 20.99 kg/m  Wt Readings from Last 3 Encounters:  07/10/19 134 lb (60.8 kg)  04/09/19 135 lb (61.2 kg)  01/01/19 135 lb (61.2 kg)     Health Maintenance Due  Topic Date Due  . Hepatitis C Screening  1946-04-21  . DEXA SCAN  04/27/2019    There are no preventive care reminders to display for this patient.  Lab Results  Component Value Date   TSH 3.20 04/09/2019   Lab Results  Component Value Date   WBC 3.9 04/04/2017   HGB 12.3 04/04/2017   HCT 37.2 04/04/2017   MCV 93.9 04/04/2017   PLT 207 04/04/2017   Lab Results  Component Value Date   NA 139 01/22/2019   K 4.2 01/22/2019   CHLORIDE 103 01/31/2017   CO2 30 01/22/2019   GLUCOSE 94 01/22/2019   BUN 19 01/22/2019   CREATININE 0.89 01/22/2019   BILITOT 0.5 01/22/2019   ALKPHOS 29 (L) 04/04/2017   AST 24 01/22/2019   ALT 12 01/22/2019   PROT 6.9 01/22/2019   ALBUMIN 4.8 04/04/2017   CALCIUM 10.1 01/22/2019   ANIONGAP 9 01/31/2017   EGFR 54 (L) 01/31/2017   Lab Results  Component Value Date   CHOL 289 (H) 04/09/2019   Lab Results  Component Value Date   HDL 81 04/09/2019   Lab Results  Component Value Date   LDLCALC 169 (H) 04/09/2019   Lab Results  Component Value Date   TRIG 216 (H) 04/09/2019   Lab Results  Component Value Date   CHOLHDL 3.6 04/09/2019    No results found for: HGBA1C    Assessment & Plan:   Problem List Items Addressed This Visit      Endocrine   Hypothyroidism    We will just taking 50 mcg daily.  We will plan to recheck TSH.  If it still elevated in the 3 range and we will try to add a half a tab 1 day a week.      Relevant Medications   levothyroxine (SYNTHROID) 50 MCG tablet   Other Relevant Orders   TSH     Nervous and Auditory   NEURALGIA, TRIGEMINAL   Relevant Medications   HYDROcodone-acetaminophen (NORCO/VICODIN) 5-325 MG tablet   carbamazepine (TEGRETOL XR) 100 MG 12 hr tablet     Musculoskeletal and Integument   Osteoporosis    Well on Prolia every 6 months.      Degenerative disc disease, lumbar   Relevant Medications   HYDROcodone-acetaminophen (NORCO/VICODIN) 5-325 MG tablet     Other   NECK PAIN   Relevant Medications   HYDROcodone-acetaminophen (NORCO/VICODIN) 5-325 MG tablet   Mild cognitive impairment    Very concerned during our office visit today because she actually repeated herself multiple times which was a little bit unlike her.  In fact we have just done  a Mini-Mental status exam on her in August and her score was a 27.  We repeated the Mini-Mental status exam and the score was 22 out of 30.  I am concerned about a potential rapid progression in memory.  I would really like her to follow-up in a couple weeks so that we can go over this in more detail.  When I asked her if she felt like she was having memory problems she said no I really like for her husband to come so that we can discuss this and discuss additional work-up including possible imaging and blood work.      Encounter for chronic pain management - Primary    Indication for chronic opioid:Chronic neck and back pain  medication and dose:Hydrocodone 5/325. # pills per month:60 Last UDS date:8/20. Opioid Treatment Agreement signed (Y/N):Y Opioid Treatment Agreement last reviewed with patient:10/03/2018 F/U in 3  mo.  NCCSRS reviewed this encounter (include red flags):Yes      Relevant Medications   HYDROcodone-acetaminophen (NORCO/VICODIN) 5-325 MG tablet      Meds ordered this encounter  Medications  . HYDROcodone-acetaminophen (NORCO/VICODIN) 5-325 MG tablet    Sig: TAKE 1 TABLET EVERY TWELVE HOURS AS NEEDED    Dispense:  60 tablet    Refill:  0  . levothyroxine (SYNTHROID) 50 MCG tablet    Sig: Take 1 tablet (50 mcg total) by mouth daily. Take extra half a tab a week.    Dispense:  96 tablet    Refill:  0    Follow-up: Return in about 2 weeks (around 07/24/2019) for memory.    Beatrice Lecher, MD

## 2019-07-10 NOTE — Assessment & Plan Note (Signed)
We will just taking 50 mcg daily.  We will plan to recheck TSH.  If it still elevated in the 3 range and we will try to add a half a tab 1 day a week.

## 2019-07-11 LAB — TSH: TSH: 2.04 mIU/L (ref 0.40–4.50)

## 2019-07-12 ENCOUNTER — Emergency Department
Admission: EM | Admit: 2019-07-12 | Discharge: 2019-07-12 | Disposition: A | Discharge status: Home / Self Care | Payer: Medicare HMO | Source: Home / Self Care

## 2019-07-12 ENCOUNTER — Other Ambulatory Visit: Payer: Self-pay

## 2019-07-12 DIAGNOSIS — R030 Elevated blood-pressure reading, without diagnosis of hypertension: Secondary | ICD-10-CM

## 2019-07-12 DIAGNOSIS — R3989 Other symptoms and signs involving the genitourinary system: Secondary | ICD-10-CM

## 2019-07-12 DIAGNOSIS — R300 Dysuria: Secondary | ICD-10-CM | POA: Diagnosis not present

## 2019-07-12 LAB — POCT URINALYSIS DIP (MANUAL ENTRY)
Bilirubin, UA: NEGATIVE
Blood, UA: NEGATIVE
Glucose, UA: NEGATIVE mg/dL
Ketones, POC UA: NEGATIVE mg/dL
Leukocytes, UA: NEGATIVE
Nitrite, UA: NEGATIVE
Protein Ur, POC: NEGATIVE mg/dL
Spec Grav, UA: 1.02 (ref 1.010–1.025)
Urobilinogen, UA: 0.2 E.U./dL
pH, UA: 7 (ref 5.0–8.0)

## 2019-07-12 NOTE — Discharge Instructions (Signed)
°  It is encouraged that you stay well hydrated and schedule a follow up appointment with [facility] to see if they want to change your medication, add a new medication or do any additional testing since you have continued to have urinary symptoms for 1 month.  Your blood pressure was high today. It is recommended you follow up with your family doctor next week to have it rechecked.

## 2019-07-12 NOTE — ED Triage Notes (Signed)
Pt c/o urinary frequency and uncomfortable feeling when urinating. Saw ruologist on 10/16. Was started on Trimethoprim on 10/27 to prevent bladder infections.

## 2019-07-12 NOTE — ED Provider Notes (Signed)
[facility]    CSN: [id number] Arrival date & time: 07/12/19  1746      History   Chief Complaint Chief Complaint  Patient presents with  . Urinary Frequency    HPI [name] is a 73 y.o. female.   HPI  [name] is a 73 y.o. female presenting to UC with c/o urinary frequency and discomfort for about 1 month. She was seen by [facility] on 10/16, dx with a UTI and started on daily trimethoprim due to recurrent bladder infections.  She has been taking daily as prescribed along with Azo as needed but still having symptoms. She was schedule for a follow up appointment yesterday, 07/11/2019 but missed the appointment. She is concerned she still has an infection. She did not get the appointment with urology rescheduled yet.  Denies fever, chills, n/v/d. BP and HR elevated in triage. Pt denies any other symptoms and notes she typically has good BP but thinks her pulse and BP are elevated from rushing into UC this evening. Denies HA, dizziness, chest pain, SOB, or palpitations.    Past Medical History:  Diagnosis Date  . Breast cancer ([place]) 1999   T1N0 LEFT BREAST  . 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 Degenerative disc disease   . History of bladder infections    RECURRENT  . Hyperlipidemia   . Osteoporosis 03-04-2011    LAST BONE DENSITY 03-04-2011  . Recurrent sinusitis   . Thyroid disease   . Trigeminal neuralgia     Patient Active Problem List   Diagnosis Date Noted  . Mild cognitive impairment 04/09/2019  . Malignant neoplasm of lower-outer quadrant of left breast of female, estrogen receptor positive ([place]) 07/20/2018  . Primary osteoarthritis of first carpometacarpal joint of right hand 10/19/2017  . Encounter for chronic pain management 11/22/2016  . Degeneration of cervical intervertebral disc 05/25/2016  . Allergy to environmental factors 05/26/2015  . High risk medication use 05/26/2015   . 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 Recurrent sinusitis   . Breast cancer ([place])   . History of bladder infections   . Thyroid disease   . Hypothyroidism 11/13/2013  . FH: TAH-BSO (total abdominal hysterectomy and bilateral salpingo-oophorectomy) 03/05/2013  . Dyspareunia 03/05/2013  . Chronic pelvic pain in female 03/05/2013  . Atrophic vaginitis 03/05/2013  . Breast carcinoma, left.  Original resection 1999. 01/28/2011  . ALLERGIC CONJUNCTIVITIS 11/27/2009  . GERD 10/07/2009  . VERTIGO 07/24/2009  . NECK PAIN 05/21/2009  . ALLERGIC RHINITIS 09/26/2008  . HYPERLIPIDEMIA, MIXED 12/20/2007  . NEURALGIA, TRIGEMINAL 12/20/2007  . UNSPECIFIED GLAUCOMA 12/20/2007  . Degenerative disc disease, lumbar 12/20/2007  . Osteoporosis 12/20/2007  . Cancer ([place]) 03/29/2006    Past Surgical History:  Procedure Laterality Date  . ABDOMINAL HYSTERECTOMY  2005   Complete  . BLADDER SUSPENSION  2005  . BREAST LUMPECTOMY Left 1999   malignant  . BREAST SURGERY  1999   Lumpectomy L Br  . BREAST SURGERY  2007   Lymph Node Removal L Chest Wall  . cancerous LN removed  2008  . left breast lumpectomy  1999    OB History   No obstetric history on file.      Home Medications    Prior to Admission medications   Medication Sig Start Date End Date Taking? Authorizing Provider  azelastine (ASTELIN) 0.1 %  nasal spray Place 2 sprays into both nostrils daily. 02/20/19   [provider]  Calcium-Magnesium-Vitamin D (CALCIUM 1200+D3 PO) Take by mouth daily.     [provider]  carbamazepine (TEGRETOL XR) 100 MG 12 hr tablet Take 100 mg by mouth 2 (two) times daily. 06/19/19   [provider]  denosumab (PROLIA) 60 MG/ML SOLN injection Inject 60 mg into the skin every 6 (six) months. Administer in upper arm, thigh, or abdomen    [provider]  EPINEPHrine (EPIPEN 2-PAK) 0.3  mg/0.3 mL IJ SOAJ injection Inject 0.3 mls (0.3 mg total) into the muscles AS NEEDED (allergic reaction) 12/14/17   Hali Marry, MD  fish oil-omega-3 fatty acids 1000 MG capsule Take 2 g by mouth daily.      [provider]  fluticasone (FLONASE) 50 MCG/ACT nasal spray Place 1 spray into both nostrils daily. 08/01/15   [provider]  HYDROcodone-acetaminophen (NORCO/VICODIN) 5-325 MG tablet TAKE 1 TABLET EVERY TWELVE HOURS AS NEEDED 07/10/19   Hali Marry, MD  latanoprost (XALATAN) 0.005 % ophthalmic solution  11/26/17   [provider]  levocetirizine (XYZAL) 5 MG tablet Take 5 mg by mouth daily.      [provider]  levothyroxine (SYNTHROID) 50 MCG tablet Take 1 tablet (50 mcg total) by mouth daily. Take extra half a tab a week. 07/10/19   Hali Marry, MD  montelukast (SINGULAIR) 10 MG tablet  09/07/17   [provider]  omeprazole (PRILOSEC) 40 MG capsule Take 1 capsule (40 mg total) by mouth daily. 01/01/19   Hali Marry, MD  pravastatin (PRAVACHOL) 40 MG tablet Take 1 tablet (40 mg total) by mouth daily. 05/31/18   Hali Marry, MD  senna-docusate (SENOKOT-S) 8.6-50 MG tablet Take 2 tablets by mouth at bedtime.    [provider]  tamoxifen (NOLVADEX) 20 MG tablet Take 1 tablet (20 mg total) by mouth daily. 01/31/18   Nicholas Lose, MD  triamterene-hydrochlorothiazide (MAXZIDE-25) 37.5-25 MG tablet Take 1 tablet by mouth daily. 01/01/19   Hali Marry, MD  trimethoprim (TRIMPEX) 100 MG tablet Take 100 mg by mouth daily. 04/07/17   [provider]    Family History Family History  Problem Relation Age of Onset  . Cancer Mother        breast  . Stroke Mother   . Hyperlipidemia Mother   . Cancer Father        bladder  . Colon cancer Father   . Cancer Paternal Grandmother        Mouth  . Cancer Son        Breast  . Cancer Maternal Aunt        Breast  . Stomach cancer  Paternal Uncle   . Stomach cancer Paternal Uncle     Social History Social History   Tobacco Use  . Smoking status: Never Smoker  . Smokeless tobacco: Never Used  Substance Use Topics  . Alcohol use: No    Alcohol/week: 0.0 standard drinks  . Drug use: No     Allergies   Simvastatin, Azithromycin, Doxycycline, Other, Trazodone and nefazodone, Ampicillin, Cefadroxil, and Dicloxacillin   Review of Systems Review of Systems  Constitutional: Negative for chills and fever.  HENT: Negative for congestion, ear pain, sore throat, trouble swallowing and voice change.   Respiratory: Negative for cough and shortness of breath.   Cardiovascular: Negative for chest pain and palpitations.  Gastrointestinal: Negative for abdominal pain, diarrhea,  nausea and vomiting.  Genitourinary: Positive for dysuria, frequency and urgency. Negative for decreased urine volume, flank pain and pelvic pain.  Musculoskeletal: Negative for arthralgias, back pain and myalgias.  Skin: Negative for rash.  Neurological: Negative for dizziness, light-headedness and headaches.     Physical Exam Triage Vital Signs ED Triage Vitals [07/12/19 1808]  Enc Vitals Group     BP (!) 160/96     Pulse Rate (!) 116     Resp 18     Temp 98.4 F (36.9 C)     Temp Source Oral     SpO2 96 %     Weight 136 lb (61.7 kg)     Height 5\' 7"  (1.702 m)     Head Circumference      Peak Flow      Pain Score 0     Pain Loc      Pain Edu?      Excl. in [place]?    No data found.  Updated Vital Signs BP (!) 160/96 (BP Location: Right Arm)   Pulse (!) 116   Temp 98.4 F (36.9 C) (Oral)   Resp 18   Ht 5\' 7"  (1.702 m)   Wt 136 lb (61.7 kg)   SpO2 96%   BMI 21.30 kg/m   Visual Acuity Right Eye Distance:   Left Eye Distance:   Bilateral Distance:    Right Eye Near:   Left Eye Near:    Bilateral Near:     Physical Exam Vitals signs and nursing note reviewed.  Constitutional:      General: She is not in acute  distress.    Appearance: Normal appearance. She is well-developed. She is not ill-appearing, toxic-appearing or diaphoretic.  HENT:     Head: Normocephalic and atraumatic.     Nose: Nose normal.     Mouth/Throat:     Mouth: Mucous membranes are moist.  Neck:     Musculoskeletal: Normal range of motion.  Cardiovascular:     Rate and Rhythm: Regular rhythm. Tachycardia present.  Pulmonary:     Effort: Pulmonary effort is normal. No respiratory distress.     Breath sounds: Normal breath sounds.  Abdominal:     General: There is no distension.     Palpations: Abdomen is soft.     Tenderness: There is no abdominal tenderness. There is no right CVA tenderness or left CVA tenderness.  Musculoskeletal: Normal range of motion.  Skin:    General: Skin is warm and dry.  Neurological:     Mental Status: She is alert and oriented to person, place, and time.  Psychiatric:        Behavior: Behavior normal.      UC Treatments / Results  Labs (all labs ordered are listed, but only abnormal results are displayed) Labs Reviewed  POCT URINALYSIS DIP (MANUAL ENTRY)    EKG   Radiology No results found.  Procedures Procedures (including critical care time)  Medications Ordered in UC Medications - No data to display  Initial Impression / Assessment and Plan / UC Course  I have reviewed the triage vital signs and the nursing notes.  Pertinent labs & imaging results that were available during my care of the patient were reviewed by me and considered in my medical decision making (see chart for details).     UA: WNL Reassured pt of normal UA Encouraged to reschedule with urology as symptoms have persisted for 1 month despite starting the new medication Also  encouraged f/u with PCP next week for BP recheck AVS provided  Final Clinical Impressions(s) / UC Diagnoses   Final diagnoses:  Dysuria  Sensation of pressure in bladder area  Elevated blood pressure reading      Discharge Instructions      It is encouraged that you stay well hydrated and schedule a follow up appointment with [facility] to see if they want to change your medication, add a new medication or do any additional testing since you have continued to have urinary symptoms for 1 month.  Your blood pressure was high today. It is recommended you follow up with your family doctor next week to have it rechecked.    ED Prescriptions    None     PDMP not reviewed this encounter.   Noe Gens, [place] 07/12/19 1954

## 2019-07-16 DIAGNOSIS — H401131 Primary open-angle glaucoma, bilateral, mild stage: Secondary | ICD-10-CM | POA: Diagnosis not present

## 2019-07-19 DIAGNOSIS — I10 Essential (primary) hypertension: Secondary | ICD-10-CM | POA: Diagnosis not present

## 2019-07-19 DIAGNOSIS — N3000 Acute cystitis without hematuria: Secondary | ICD-10-CM | POA: Diagnosis not present

## 2019-07-19 DIAGNOSIS — Z881 Allergy status to other antibiotic agents status: Secondary | ICD-10-CM | POA: Diagnosis not present

## 2019-07-19 DIAGNOSIS — Z79899 Other long term (current) drug therapy: Secondary | ICD-10-CM | POA: Diagnosis not present

## 2019-07-19 DIAGNOSIS — R300 Dysuria: Secondary | ICD-10-CM | POA: Diagnosis not present

## 2019-07-19 DIAGNOSIS — Z888 Allergy status to other drugs, medicaments and biological substances status: Secondary | ICD-10-CM | POA: Diagnosis not present

## 2019-07-19 DIAGNOSIS — Z880 Allergy status to penicillin: Secondary | ICD-10-CM | POA: Diagnosis not present

## 2019-07-19 DIAGNOSIS — E785 Hyperlipidemia, unspecified: Secondary | ICD-10-CM | POA: Diagnosis not present

## 2019-07-24 ENCOUNTER — Ambulatory Visit (INDEPENDENT_AMBULATORY_CARE_PROVIDER_SITE_OTHER): Payer: Medicare HMO | Admitting: Family Medicine

## 2019-07-24 ENCOUNTER — Encounter: Payer: Self-pay | Admitting: Family Medicine

## 2019-07-24 VITALS — BP 125/77 | HR 80 | Temp 98.0°F | Ht 67.0 in | Wt 134.0 lb

## 2019-07-24 DIAGNOSIS — G3184 Mild cognitive impairment, so stated: Secondary | ICD-10-CM | POA: Diagnosis not present

## 2019-07-24 DIAGNOSIS — R413 Other amnesia: Secondary | ICD-10-CM | POA: Diagnosis not present

## 2019-07-24 DIAGNOSIS — N342 Other urethritis: Secondary | ICD-10-CM | POA: Diagnosis not present

## 2019-07-24 DIAGNOSIS — N3000 Acute cystitis without hematuria: Secondary | ICD-10-CM

## 2019-07-24 DIAGNOSIS — Z87440 Personal history of urinary (tract) infections: Secondary | ICD-10-CM | POA: Diagnosis not present

## 2019-07-24 MED ORDER — PREMARIN 0.625 MG/GM VA CREA: 1.0000 | TOPICAL_CREAM | Freq: Every day | VAGINAL | 12 refills | Status: DC

## 2019-07-24 NOTE — Assessment & Plan Note (Signed)
Discussed options including further work-up.  For now she feels like her performance on the test was somewhat situational so we discussed repeating the test in about 6 months and going from there.  Certainly if they notice any changes or progression in the interim or welcome to do evaluation sooner rather than later.  Will hold off on imaging.

## 2019-07-24 NOTE — Assessment & Plan Note (Signed)
Did discuss some options including topical estrogen.  She tried it at 1 point years ago and not exactly clear why it was discontinued and she does not remember herself.  I really thinks that she is having mostly postmenopausal infections that this could actually be really helpful for her and it is just topical.  We discussed how to apply the cream just to the urethral area and the outside of the vaginal opening she does not need to do internal estrogen.  Explained that it can take several weeks to start improving symptoms.  She would use nightly for 1 week and then 3 days a week after that.  Did also encourage her to follow-up with Dr. Vikki Ports in the next couple months to see if maybe he wants to make a change on the trimethoprim.  Also encouraged her to make sure that she is hydrating well to keep things going and flushed.  She still feeling a little bit fatigued but I think this will get better.  Encouraged her to be active and get up and get some fresh air get moving a little bit.

## 2019-07-24 NOTE — Progress Notes (Signed)
Virtual Visit via Telephone Note  I connected with [name] on 07/24/19 at 10:30 AM EST by telephone and verified that I am speaking with the correct person using two identifiers.   I discussed the limitations, risks, security and privacy concerns of performing an evaluation and management service by telephone and the availability of in person appointments. I also discussed with the patient that there may be a patient responsible charge related to this service. The patient expressed understanding and agreed to proceed.      Established Patient Office Visit  Subjective:  Patient ID: [name], female    DOB: August 17, 1946  Age: 73 y.o. MRN: [id number]  CC:  Chief Complaint  Patient presents with  . Follow-up    HPI [name] presents for mild cognitive impairment.  Actually saw her on November 11 for her routine follow-up.  After the visit I was quite concerned as she had actually repeated herself multiple times during the conversation which really was not like her.  Mood seemed good overall.  And she had no difficulty with speech or communication.  We did a Mini-Mental exam on her back in August and her score was 27 we repeated it at the visit a week ago and it was down to 22 out of a possible score of 30.  She did not feel like her memory problems were significant or impactful.  She felt like she was particularly stressed that day because she had to drive to her own appointment as her husband had a doctor's appointment and she had not driven in in quite a long time so she felt like that really ramped up her anxiety levels.  Plus within a couple days she actually ended up going to urgent care for UTI.  She is now on an antibiotic for the UTI and has about 5 more days to complete.  She is feeling better overall but still having a lot of fatigue.  This is now her second UTI in the last 2 months.  She does follow with urology, Dr. Vikki Ports.  She was feeling like maybe the trimethoprim  just is not working as well as it used to.   Past Medical History:  Diagnosis Date  . Breast cancer ([place]) 1999   T1N0 LEFT BREAST  . 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 Degenerative disc disease   . History of bladder infections    RECURRENT  . Hyperlipidemia   . Osteoporosis 03-04-2011    LAST BONE DENSITY 03-04-2011  . Recurrent sinusitis   . Thyroid disease   . Trigeminal neuralgia     Past Surgical History:  Procedure Laterality Date  . ABDOMINAL HYSTERECTOMY  2005   Complete  . BLADDER SUSPENSION  2005  . BREAST LUMPECTOMY Left 1999   malignant  . BREAST SURGERY  1999   Lumpectomy L Br  . BREAST SURGERY  2007   Lymph Node Removal L Chest Wall  . cancerous LN removed  2008  . left breast lumpectomy  1999    Family History  Problem Relation Age of Onset  . Cancer Mother        breast  . Stroke Mother   . Hyperlipidemia Mother   . Cancer Father        bladder  . Colon cancer Father   . Cancer Paternal Grandmother        Mouth  . Cancer Son  Breast  . Cancer Maternal Aunt        Breast  . Stomach cancer Paternal Uncle   . Stomach cancer Paternal Uncle     Social History   Socioeconomic History  . Marital status: Married    Spouse name: [name]   . Number of children: 3  . Years of education: Therapist, sports  . Highest education level: Not on file  Occupational History    Comment: Retired- Therapist, sports  Social Needs  . Financial resource strain: Not on file  . Food insecurity    Worry: Not on file    Inability: Not on file  . Transportation needs    Medical: Not on file    Non-medical: Not on file  Tobacco Use  . Smoking status: Never Smoker  . Smokeless tobacco: Never Used  Substance and Sexual Activity  . Alcohol use: No    Alcohol/week: 0.0 standard drinks  . Drug use: No  . Sexual activity: Not on file    Comment: housewife, married, 3 kids.  Lifestyle  . Physical activity    Days per week: Not on  file    Minutes per session: Not on file  . Stress: Not on file  Relationships  . Social Herbalist on phone: Not on file    Gets together: Not on file    Attends religious service: Not on file    Active member of club or organization: Not on file    Attends meetings of clubs or organizations: Not on file    Relationship status: Not on file  . Intimate partner violence    Fear of current or ex partner: Not on file    Emotionally abused: Not on file    Physically abused: Not on file    Forced sexual activity: Not on file  Other Topics Concern  . Not on file  Social History Narrative   Patient lives at home with her husband [name])   Retired - RN   Right handed.   Caffeine two cups coffee daily and one sweet tea.    Outpatient Medications Prior to Visit  Medication Sig Dispense Refill  . azelastine (ASTELIN) 0.1 % nasal spray Place 2 sprays into both nostrils daily.    . Calcium-Magnesium-Vitamin D (CALCIUM 1200+D3 PO) Take by mouth daily.     . carbamazepine (TEGRETOL XR) 100 MG 12 hr tablet Take 100 mg by mouth 2 (two) times daily.    . ceditoren (SPECTRACEF) 200 MG tablet Take 200 mg by mouth 2 (two) times daily.    Marland Kitchen denosumab (PROLIA) 60 MG/ML SOLN injection Inject 60 mg into the skin every 6 (six) months. Administer in upper arm, thigh, or abdomen    . EPINEPHrine (EPIPEN 2-PAK) 0.3 mg/0.3 mL IJ SOAJ injection Inject 0.3 mls (0.3 mg total) into the muscles AS NEEDED (allergic reaction) 2 Device prn  . fish oil-omega-3 fatty acids 1000 MG capsule Take 2 g by mouth daily.      . fluticasone (FLONASE) 50 MCG/ACT nasal spray Place 1 spray into both nostrils daily.  1  . HYDROcodone-acetaminophen (NORCO/VICODIN) 5-325 MG tablet TAKE 1 TABLET EVERY TWELVE HOURS AS NEEDED 60 tablet 0  . latanoprost (XALATAN) 0.005 % ophthalmic solution     . levocetirizine (XYZAL) 5 MG tablet Take 5 mg by mouth daily.      Marland Kitchen levothyroxine (SYNTHROID) 50 MCG tablet Take 1 tablet (50 mcg  total) by mouth daily. Take extra half a tab a week.  96 tablet 0  . omeprazole (PRILOSEC) 40 MG capsule Take 1 capsule (40 mg total) by mouth daily. 90 capsule 3  . pravastatin (PRAVACHOL) 40 MG tablet Take 1 tablet (40 mg total) by mouth daily. 90 tablet 3  . senna-docusate (SENOKOT-S) 8.6-50 MG tablet Take 2 tablets by mouth at bedtime.    . tamoxifen (NOLVADEX) 20 MG tablet Take 1 tablet (20 mg total) by mouth daily. 90 tablet 3  . trimethoprim (TRIMPEX) 100 MG tablet Take 100 mg by mouth daily.    Marland Kitchen triamterene-hydrochlorothiazide (MAXZIDE-25) 37.5-25 MG tablet Take 1 tablet by mouth daily. (Patient not taking: Reported on 07/24/2019) 90 tablet 3  . montelukast (SINGULAIR) 10 MG tablet      No facility-administered medications prior to visit.     Allergies  Allergen Reactions  . Simvastatin Other (See Comments)    Aches and memory loss  . Azithromycin Hives  . Doxycycline Itching  . Other Rash  . Trazodone And Nefazodone Other (See Comments)    Memory impairment and palpitations.   . Ampicillin Rash  . Cefadroxil Rash  . Dicloxacillin Rash    ROS Review of Systems    Objective:    Physical Exam  BP 125/77   Pulse 80   Temp 98 F (36.7 C) (Oral)   Ht '5\' 7"'  (1.702 m)   Wt 134 lb (60.8 kg)   BMI 20.99 kg/m  Wt Readings from Last 3 Encounters:  07/24/19 134 lb (60.8 kg)  07/12/19 136 lb (61.7 kg)  07/10/19 134 lb (60.8 kg)     Health Maintenance Due  Topic Date Due  . Hepatitis C Screening  18-Apr-1946  . DEXA SCAN  04/27/2019    There are no preventive care reminders to display for this patient.  Lab Results  Component Value Date   TSH 2.04 07/10/2019   Lab Results  Component Value Date   WBC 3.9 04/04/2017   HGB 12.3 04/04/2017   HCT 37.2 04/04/2017   MCV 93.9 04/04/2017   PLT 207 04/04/2017   Lab Results  Component Value Date   NA 139 01/22/2019   K 4.2 01/22/2019   CHLORIDE 103 01/31/2017   CO2 30 01/22/2019   GLUCOSE 94 01/22/2019   BUN  19 01/22/2019   CREATININE 0.89 01/22/2019   BILITOT 0.5 01/22/2019   ALKPHOS 29 (L) 04/04/2017   AST 24 01/22/2019   ALT 12 01/22/2019   PROT 6.9 01/22/2019   ALBUMIN 4.8 04/04/2017   CALCIUM 10.1 01/22/2019   ANIONGAP 9 01/31/2017   EGFR 54 (L) 01/31/2017   Lab Results  Component Value Date   CHOL 289 (H) 04/09/2019   Lab Results  Component Value Date   HDL 81 04/09/2019   Lab Results  Component Value Date   LDLCALC 169 (H) 04/09/2019   Lab Results  Component Value Date   TRIG 216 (H) 04/09/2019   Lab Results  Component Value Date   CHOLHDL 3.6 04/09/2019   No results found for: HGBA1C    Assessment & Plan:   Problem List Items Addressed This Visit      Genitourinary   Acute cystitis without hematuria    Currently on ceditoren 244m for UTI.  Has 5 more days of ABX.  Plan to retest next Friday to make sure UTI resolved.         Other   Mild cognitive impairment    Discussed options including further work-up.  For now she feels like her performance on  the test was somewhat situational so we discussed repeating the test in about 6 months and going from there.  Certainly if they notice any changes or progression in the interim or welcome to do evaluation sooner rather than later.  Will hold off on imaging.      History of bladder infections    Did discuss some options including topical estrogen.  She tried it at 1 point years ago and not exactly clear why it was discontinued and she does not remember herself.  I really thinks that she is having mostly postmenopausal infections that this could actually be really helpful for her and it is just topical.  We discussed how to apply the cream just to the urethral area and the outside of the vaginal opening she does not need to do internal estrogen.  Explained that it can take several weeks to start improving symptoms.  She would use nightly for 1 week and then 3 days a week after that.  Did also encourage her to follow-up  with Dr. Vikki Ports in the next couple months to see if maybe he wants to make a change on the trimethoprim.  Also encouraged her to make sure that she is hydrating well to keep things going and flushed.  She still feeling a little bit fatigued but I think this will get better.  Encouraged her to be active and get up and get some fresh air get moving a little bit.      Relevant Medications   conjugated estrogens (PREMARIN) vaginal cream    Other Visit Diagnoses    MCI (mild cognitive impairment)    -  Primary   Memory loss       Atrophic urethritis       Relevant Medications   conjugated estrogens (PREMARIN) vaginal cream      Meds ordered this encounter  Medications  . conjugated estrogens (PREMARIN) vaginal cream    Sig: Place 1 Applicatorful vaginally daily. Apply externally only around urethra and vagainal opening nightly x 1 week then 3 nights per week after that.    Dispense:  60 g    Refill:  12    Follow-up: Return for 1 week after MRI results.  .   I discussed the assessment and treatment plan with the patient. The patient was provided an opportunity to ask questions and all were answered. The patient agreed with the plan and demonstrated an understanding of the instructions.   The patient was advised to call back or seek an in-person evaluation if the symptoms worsen or if the condition fails to improve as anticipated.  I provided 25 minutes of non-face-to-face time during this encounter.   Beatrice Lecher, MD  Beatrice Lecher, MD

## 2019-07-24 NOTE — Assessment & Plan Note (Signed)
Currently on ceditoren 200mg  for UTI.  Has 5 more days of ABX.  Plan to retest next Friday to make sure UTI resolved.

## 2019-08-05 DIAGNOSIS — N3020 Other chronic cystitis without hematuria: Secondary | ICD-10-CM | POA: Diagnosis not present

## 2019-08-09 ENCOUNTER — Other Ambulatory Visit: Payer: Self-pay

## 2019-08-09 DIAGNOSIS — G8929 Other chronic pain: Secondary | ICD-10-CM

## 2019-08-09 DIAGNOSIS — G500 Trigeminal neuralgia: Secondary | ICD-10-CM

## 2019-08-09 DIAGNOSIS — M542 Cervicalgia: Secondary | ICD-10-CM

## 2019-08-09 DIAGNOSIS — M5136 Other intervertebral disc degeneration, lumbar region: Secondary | ICD-10-CM

## 2019-08-09 MED ORDER — HYDROCODONE-ACETAMINOPHEN 5-325 MG PO TABS: ORAL_TABLET | ORAL | 0 refills | Status: DC

## 2019-08-12 ENCOUNTER — Ambulatory Visit (INDEPENDENT_AMBULATORY_CARE_PROVIDER_SITE_OTHER): Payer: Medicare HMO | Admitting: Physician Assistant

## 2019-08-12 ENCOUNTER — Encounter: Payer: Self-pay | Admitting: Physician Assistant

## 2019-08-12 ENCOUNTER — Other Ambulatory Visit: Payer: Self-pay

## 2019-08-12 VITALS — BP 154/83 | HR 104 | Ht 67.0 in | Wt 135.0 lb

## 2019-08-12 DIAGNOSIS — Z853 Personal history of malignant neoplasm of breast: Secondary | ICD-10-CM | POA: Diagnosis not present

## 2019-08-12 DIAGNOSIS — M545 Low back pain, unspecified: Secondary | ICD-10-CM

## 2019-08-12 MED ORDER — MELOXICAM 15 MG PO TABS: 15.0000 mg | ORAL_TABLET | Freq: Every day | ORAL | 0 refills | Status: DC

## 2019-08-12 MED ORDER — CYCLOBENZAPRINE HCL 5 MG PO TABS: 5.0000 mg | ORAL_TABLET | Freq: Three times a day (TID) | ORAL | 0 refills | Status: DC | PRN

## 2019-08-12 NOTE — Progress Notes (Signed)
Subjective:    Patient ID: [name], female    DOB: [birth year], 73 y.o.   MRN: [id number]  HPI  Patient is a 73 year old female with history of lumbar degenerative disc disease, osteoporosis, breast cancer that has metastasized to lymph node who presents to the clinic with low mid back pain for the last 4 days.  She denies any pain or radiation into her lower extremities.  She denies any bowel or bladder dysfunction.  She denies any leg weakness or saddle anesthesia.  She was not aware of any specific injury that occurred.  She was traveling with her husband and he has a larger truck that she has to step up on every time she gets them.  She wonders if that repetitive motion caused her back to start hurting.  She would rate the pain 3 out of 10.  She has not done anything to make better other than heating pad and taking her hydrocodone that she has.  Movement seems to make pain worse and laying down seems to make pain better.   .. Active Ambulatory Problems    Diagnosis Date Noted  . HYPERLIPIDEMIA, MIXED 12/20/2007  . NEURALGIA, TRIGEMINAL 12/20/2007  . UNSPECIFIED GLAUCOMA 12/20/2007  . ALLERGIC CONJUNCTIVITIS 11/27/2009  . ALLERGIC RHINITIS 09/26/2008  . GERD 10/07/2009  . Degenerative disc disease, lumbar 12/20/2007  . NECK PAIN 05/21/2009  . Osteoporosis 12/20/2007  . VERTIGO 07/24/2009  . Breast carcinoma, left.  Original resection 1999. 01/28/2011  . Hypothyroidism 11/13/2013  . Recurrent sinusitis   . Breast cancer ([place])   . Cancer ([place]) 03/29/2006  . History of bladder infections   . Thyroid disease   . 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 Allergy to environmental factors 05/26/2015  . High risk medication use 05/26/2015  . Degeneration of cervical intervertebral disc 05/25/2016  . Encounter for chronic pain management 11/22/2016  . Primary osteoarthritis of first carpometacarpal joint  of right hand 10/19/2017  . FH: TAH-BSO (total abdominal hysterectomy and bilateral salpingo-oophorectomy) 03/05/2013  . Dyspareunia 03/05/2013  . Chronic pelvic pain in female 03/05/2013  . Atrophic vaginitis 03/05/2013  . Malignant neoplasm of lower-outer quadrant of left breast of female, estrogen receptor positive ([place]) 07/20/2018  . Mild cognitive impairment 04/09/2019  . Acute cystitis without hematuria 07/24/2019  . Acute midline low back pain without sciatica 08/13/2019   Resolved Ambulatory Problems    Diagnosis Date Noted  . HYPERLIPIDEMIA 03/01/2010  . BREAST PAIN 07/12/2010  . SINUSITIS - ACUTE-NOS 09/23/2010  . MASTITIS 11/05/2010  . Trigeminal neuralgia pain 02/08/2011  . Nipple soreness 11/16/2014  . Lumbosacral strain 03/09/2015  . Tachycardia 03/09/2015  . Need for prophylactic vaccination and inoculation against influenza 05/26/2015  . Gastritis and gastroduodenitis 06/24/2015   Past Medical History:  Diagnosis Date  . Degenerative disc disease   . Hyperlipidemia   . Osteoporosis 03-04-2011     Review of Systems See HPI.     Objective:   Physical Exam Vitals reviewed.  Constitutional:      Appearance: Normal appearance.  Cardiovascular:     Rate and Rhythm: Normal rate and regular rhythm.     Pulses: Normal pulses.  Pulmonary:     Effort: Pulmonary effort is normal.     Breath sounds: Normal breath sounds.  Musculoskeletal:     Comments: NROM at waist and side to side. No increase in pain or symptoms.  No tenderness over lumbar  spine.  Tightness over paraspinal muscles of lumbar spine.  Negative straight leg test.  Normal strength bilateral lower legs.  DTR patellar 2+ right, 1+left.   Neurological:     General: No focal deficit present.     Mental Status: She is alert and oriented to person, place, and time.  Psychiatric:        Mood and Affect: Mood normal.           Assessment & Plan:  Marland KitchenMarland KitchenPatsy was seen today for back  pain.  Diagnoses and all orders for this visit:  Acute midline low back pain without sciatica -     DG Lumbar Spine Complete -     meloxicam (MOBIC) 15 MG tablet; Take 1 tablet (15 mg total) by mouth daily. -     cyclobenzaprine (FLEXERIL) 5 MG tablet; Take 1 tablet (5 mg total) by mouth 3 (three) times daily as needed for muscle spasms.  History of breast cancer   No red flags on exam today.  She does have a history of metastatic breast cancer.  Due to this history I would like to get a lumbar . likely her symptoms are more muscular and/or arthritic due to her known lumbar degenerative disc disease.  Encouraged her to take muscle relaxer as needed.  Sedation warning given.  She can start meloxicam for the next 2 weeks.  Told her to take with food.  Then she can just use as needed.  Discussed symptomatic care with low back exercises, icy hot patches, TENS unit.  Follow-up as needed and/or if symptoms worsens.

## 2019-08-12 NOTE — Patient Instructions (Signed)
mobic for the next 5-10 days daily with food.  Flexeril as needed for muscle relaxer.  Tens unit via [place].  Icy hot patches.    Low Back Sprain or Strain Rehab Ask your health care provider which exercises are safe for you. Do exercises exactly as told by your health care provider and adjust them as directed. It is normal to feel mild stretching, pulling, tightness, or discomfort as you do these exercises. Stop right away if you feel sudden pain or your pain gets worse. Do not begin these exercises until told by your health care provider. Stretching and range-of-motion exercises These exercises warm up your muscles and joints and improve the movement and flexibility of your back. These exercises also help to relieve pain, numbness, and tingling. Lumbar rotation  1. Lie on your back on a firm surface and bend your knees. 2. Straighten your arms out to your sides so each arm forms a 90-degree angle (right angle) with a side of your body. 3. Slowly move (rotate) both of your knees to one side of your body until you feel a stretch in your lower back (lumbar). Try not to let your shoulders lift off the floor. 4. Hold this position for __________ seconds. 5. Tense your abdominal muscles and slowly move your knees back to the starting position. 6. Repeat this exercise on the other side of your body. Repeat __________ times. Complete this exercise __________ times a day. Single knee to chest  1. Lie on your back on a firm surface with both legs straight. 2. Bend one of your knees. Use your hands to move your knee up toward your chest until you feel a gentle stretch in your lower back and buttock. ? Hold your leg in this position by holding on to the front of your knee. ? Keep your other leg as straight as possible. 3. Hold this position for __________ seconds. 4. Slowly return to the starting position. 5. Repeat with your other leg. Repeat __________ times. Complete this exercise __________  times a day. Prone extension on elbows  1. Lie on your abdomen on a firm surface (prone position). 2. Prop yourself up on your elbows. 3. Use your arms to help lift your chest up until you feel a gentle stretch in your abdomen and your lower back. ? This will place some of your body weight on your elbows. If this is uncomfortable, try stacking pillows under your chest. ? Your hips should stay down, against the surface that you are lying on. Keep your hip and back muscles relaxed. 4. Hold this position for __________ seconds. 5. Slowly relax your upper body and return to the starting position. Repeat __________ times. Complete this exercise __________ times a day. Strengthening exercises These exercises build strength and endurance in your back. Endurance is the ability to use your muscles for a long time, even after they get tired. Pelvic tilt This exercise strengthens the muscles that lie deep in the abdomen. 1. Lie on your back on a firm surface. Bend your knees and keep your feet flat on the floor. 2. Tense your abdominal muscles. Tip your pelvis up toward the ceiling and flatten your lower back into the floor. ? To help with this exercise, you may place a small towel under your lower back and try to push your back into the towel. 3. Hold this position for __________ seconds. 4. Let your muscles relax completely before you repeat this exercise. Repeat __________ times. Complete this exercise __________  times a day. Alternating arm and leg raises  1. Get on your hands and knees on a firm surface. If you are on a hard floor, you may want to use padding, such as an exercise mat, to cushion your knees. 2. Line up your arms and legs. Your hands should be directly below your shoulders, and your knees should be directly below your hips. 3. Lift your left leg behind you. At the same time, raise your right arm and straighten it in front of you. ? Do not lift your leg higher than your hip. ? Do  not lift your arm higher than your shoulder. ? Keep your abdominal and back muscles tight. ? Keep your hips facing the ground. ? Do not arch your back. ? Keep your balance carefully, and do not hold your breath. 4. Hold this position for __________ seconds. 5. Slowly return to the starting position. 6. Repeat with your right leg and your left arm. Repeat __________ times. Complete this exercise __________ times a day. Abdominal set with straight leg raise  1. Lie on your back on a firm surface. 2. Bend one of your knees and keep your other leg straight. 3. Tense your abdominal muscles and lift your straight leg up, 4-6 inches (10-15 cm) off the ground. 4. Keep your abdominal muscles tight and hold this position for __________ seconds. ? Do not hold your breath. ? Do not arch your back. Keep it flat against the ground. 5. Keep your abdominal muscles tense as you slowly lower your leg back to the starting position. 6. Repeat with your other leg. Repeat __________ times. Complete this exercise __________ times a day. Single leg lower with bent knees 1. Lie on your back on a firm surface. 2. Tense your abdominal muscles and lift your feet off the floor, one foot at a time, so your knees and hips are bent in 90-degree angles (right angles). ? Your knees should be over your hips and your lower legs should be parallel to the floor. 3. Keeping your abdominal muscles tense and your knee bent, slowly lower one of your legs so your toe touches the ground. 4. Lift your leg back up to return to the starting position. ? Do not hold your breath. ? Do not let your back arch. Keep your back flat against the ground. 5. Repeat with your other leg. Repeat __________ times. Complete this exercise __________ times a day. Posture and body mechanics Good posture and healthy body mechanics can help to relieve stress in your body's tissues and joints. Body mechanics refers to the movements and positions of your  body while you do your daily activities. Posture is part of body mechanics. Good posture means:  Your spine is in its natural S-curve position (neutral).  Your shoulders are pulled back slightly.  Your head is not tipped forward. Follow these guidelines to improve your posture and body mechanics in your everyday activities. Standing   When standing, keep your spine neutral and your feet about hip width apart. Keep a slight bend in your knees. Your ears, shoulders, and hips should line up.  When you do a task in which you stand in one place for a long time, place one foot up on a stable object that is 2-4 inches (5-10 cm) high, such as a footstool. This helps keep your spine neutral. Sitting   When sitting, keep your spine neutral and keep your feet flat on the floor. Use a footrest, if necessary, and keep  your thighs parallel to the floor. Avoid rounding your shoulders, and avoid tilting your head forward.  When working at a desk or a computer, keep your desk at a height where your hands are slightly lower than your elbows. Slide your chair under your desk so you are close enough to maintain good posture.  When working at a computer, place your monitor at a height where you are looking straight ahead and you do not have to tilt your head forward or downward to look at the screen. Resting  When lying down and resting, avoid positions that are most painful for you.  If you have pain with activities such as sitting, bending, stooping, or squatting, lie in a position in which your body does not bend very much. For example, avoid curling up on your side with your arms and knees near your chest (fetal position).  If you have pain with activities such as standing for a long time or reaching with your arms, lie with your spine in a neutral position and bend your knees slightly. Try the following positions: ? Lying on your side with a pillow between your knees. ? Lying on your back with a pillow  under your knees. Lifting   When lifting objects, keep your feet at least shoulder width apart and tighten your abdominal muscles.  Bend your knees and hips and keep your spine neutral. It is important to lift using the strength of your legs, not your back. Do not lock your knees straight out.  Always ask for help to lift heavy or awkward objects. This information is not intended to replace advice given to you by your health care provider. Make sure you discuss any questions you have with your health care provider. Document Released: 08/15/2005 Document Revised: 12/07/2018 Document Reviewed: 09/06/2018 Elsevier Patient Education  2020 [employer].

## 2019-08-13 ENCOUNTER — Encounter: Payer: Self-pay | Admitting: Physician Assistant

## 2019-08-13 DIAGNOSIS — M545 Low back pain, unspecified: Secondary | ICD-10-CM | POA: Insufficient documentation

## 2019-09-02 ENCOUNTER — Other Ambulatory Visit: Payer: Self-pay

## 2019-09-02 ENCOUNTER — Encounter: Payer: Self-pay | Admitting: Physician Assistant

## 2019-09-02 ENCOUNTER — Ambulatory Visit (INDEPENDENT_AMBULATORY_CARE_PROVIDER_SITE_OTHER): Payer: Medicare HMO | Admitting: Physician Assistant

## 2019-09-02 VITALS — BP 139/85 | HR 103 | Temp 97.8°F | Wt 135.0 lb

## 2019-09-02 DIAGNOSIS — Z87440 Personal history of urinary (tract) infections: Secondary | ICD-10-CM

## 2019-09-02 DIAGNOSIS — N760 Acute vaginitis: Secondary | ICD-10-CM

## 2019-09-02 DIAGNOSIS — R000 Tachycardia, unspecified: Secondary | ICD-10-CM

## 2019-09-02 DIAGNOSIS — N342 Other urethritis: Secondary | ICD-10-CM

## 2019-09-02 DIAGNOSIS — Z90710 Acquired absence of both cervix and uterus: Secondary | ICD-10-CM

## 2019-09-02 MED ORDER — PREMARIN 0.625 MG/GM VA CREA: 0.5000 g | TOPICAL_CREAM | Freq: Every day | VAGINAL | 0 refills | Status: AC

## 2019-09-02 NOTE — Progress Notes (Signed)
HPI:                                                                [name] is a 74 y.o. female who presents to [facility]: [place] today for vaginal discomfort  Patient with PMH of breast cancer, recurrent UTI on prophylaxis (Trimpex) reports internal and external vaginal dryness and discomfort for the last week. She is not sexually active due to dyspareunia. She is s/p TAH for benign disease and followed by Urology for recurrent UTI. She has been using low-dose Premarin prescribed by her PCP since 06/2019 for UTI prophylaxis. She applies a pea-sized amount to external genitalia nightly. Denies fever, chills, pelvic pain, abdominal pain, flank pain. Denies dysuria, supapubic pressure, changes in urination. Denies vaginal discharge, pruritus or rashes/lesions. She is unsure of most recent ABX use.  Denies history of STI.  Past Medical History:  Diagnosis Date  . Breast cancer ([place]) 1999   T1N0 LEFT BREAST  . Cancer ([place]) 03-2006   METASTATIC BREAST TO APPARENT ISOLATED LEFT INTERNAL MAMMARY NODE  . Degenerative disc disease   . History of bladder infections    RECURRENT  . Hyperlipidemia   . Osteoporosis 03-04-2011    LAST BONE DENSITY 03-04-2011  . Recurrent sinusitis   . Thyroid disease   . Trigeminal neuralgia    Past Surgical History:  Procedure Laterality Date  . ABDOMINAL HYSTERECTOMY  2005   Complete  . BLADDER SUSPENSION  2005  . BREAST LUMPECTOMY Left 1999   malignant  . BREAST SURGERY  1999   Lumpectomy L Br  . BREAST SURGERY  2007   Lymph Node Removal L Chest Wall  . cancerous LN removed  2008  . left breast lumpectomy  1999   Social History   Tobacco Use  . Smoking status: Never Smoker  . Smokeless tobacco: Never Used  Substance Use Topics  . Alcohol use: No    Alcohol/week: 0.0 standard drinks   family history includes Cancer in her father, maternal aunt, mother, paternal grandmother, and son; Colon cancer in  her father; Hyperlipidemia in her mother; Stomach cancer in her paternal uncle and paternal uncle; Stroke in her mother.    ROS: negative except as noted in the HPI  Medications: Current Outpatient Medications  Medication Sig Dispense Refill  . azelastine (ASTELIN) 0.1 % nasal spray Place 2 sprays into both nostrils daily.    . Calcium-Magnesium-Vitamin D (CALCIUM 1200+D3 PO) Take by mouth daily.     . carbamazepine (TEGRETOL XR) 100 MG 12 hr tablet Take 100 mg by mouth 2 (two) times daily.    Marland Kitchen conjugated estrogens (PREMARIN) vaginal cream Place [id number] Applicatorfuls vaginally at bedtime for 14 days. 30 g 0  . cyclobenzaprine (FLEXERIL) 5 MG tablet Take 1 tablet (5 mg total) by mouth 3 (three) times daily as needed for muscle spasms. 30 tablet 0  . denosumab (PROLIA) 60 MG/ML SOLN injection Inject 60 mg into the skin every 6 (six) months. Administer in upper arm, thigh, or abdomen    . EPINEPHrine (EPIPEN 2-PAK) 0.3 mg/0.3 mL IJ SOAJ injection Inject 0.3 mls (0.3 mg total) into the muscles AS NEEDED (allergic reaction) 2 Device prn  . fish oil-omega-3 fatty acids 1000 MG  capsule Take 2 g by mouth daily.      . fluticasone (FLONASE) 50 MCG/ACT nasal spray Place 1 spray into both nostrils daily.  1  . HYDROcodone-acetaminophen (NORCO/VICODIN) 5-325 MG tablet TAKE 1 TABLET EVERY TWELVE HOURS AS NEEDED 60 tablet 0  . latanoprost (XALATAN) 0.005 % ophthalmic solution     . levocetirizine (XYZAL) 5 MG tablet Take 5 mg by mouth daily.      Marland Kitchen levothyroxine (SYNTHROID) 50 MCG tablet Take 1 tablet (50 mcg total) by mouth daily. Take extra half a tab a week. 96 tablet 0  . meloxicam (MOBIC) 15 MG tablet Take 1 tablet (15 mg total) by mouth daily. 30 tablet 0  . omeprazole (PRILOSEC) 40 MG capsule Take 1 capsule (40 mg total) by mouth daily. 90 capsule 3  . pravastatin (PRAVACHOL) 40 MG tablet Take 1 tablet (40 mg total) by mouth daily. 90 tablet 3  . senna-docusate (SENOKOT-S) 8.6-50 MG tablet Take 2  tablets by mouth at bedtime.    . tamoxifen (NOLVADEX) 20 MG tablet Take 1 tablet (20 mg total) by mouth daily. 90 tablet 3  . triamterene-hydrochlorothiazide (MAXZIDE-25) 37.5-25 MG tablet Take 1 tablet by mouth daily. 90 tablet 3  . trimethoprim (TRIMPEX) 100 MG tablet Take 100 mg by mouth daily.     No current facility-administered medications for this visit.   Allergies  Allergen Reactions  . Simvastatin Other (See Comments)    Aches and memory loss  . Azithromycin Hives  . Doxycycline Itching  . Other Rash  . Trazodone And Nefazodone Other (See Comments)    Memory impairment and palpitations.   . Ampicillin Rash  . Cefadroxil Rash  . Dicloxacillin Rash       Objective:  BP 139/85   Pulse (!) 103   Temp 97.8 F (36.6 C) (Oral)   Wt 135 lb (61.2 kg)   BMI 21.14 kg/m   Vitals:   09/02/19 1435 09/02/19 1509  BP: (!) 156/91 139/85  Pulse: (!) 113 (!) 103  Temp: 97.8 F (36.6 C)     Gen:  alert, not ill-appearing, no distress, appropriate for age 46: Normal work of breathing, normal phonation, clear to auscultation bilaterally, no wheezes, rales or rhonchi CV: mildly tachycardic rate, regular rhythm, s1 and s2 distinct, no murmurs, clicks or rubs  GU: vulva without rashes or lesions, normal introitus and urethral meatus, vaginal mucosa without erythema or lesions, scant amount of white discharge, cervix is surgically absent Lymph: no inguinal adenopathy   A chaperone was present for the GU exam, Johnnye Lana, LPN  No results found for this or any previous visit (from the past 72 hour(s)). No results found.    Assessment and Plan: 74 y.o. female with   .Diagnoses and all orders for this visit:  Acute vaginitis -     WET PREP FOR [place], YEAST, CLUE  History of bladder infections -     conjugated estrogens (PREMARIN) vaginal cream; Place [id number] Applicatorfuls vaginally at bedtime for 14 days.  Atrophic urethritis -     conjugated estrogens (PREMARIN)  vaginal cream; Place [id number] Applicatorfuls vaginally at bedtime for 14 days.  Tachycardia with heart rate 100-120 beats per minute  Status post complete hysterectomy    1. Vaginitis DDx includes atrophy, candidal or BV. History is most consistent with atrophic vaginitis given chronic dyspareunia and recurrent UTI No signs/symptoms of UTI today Low clinical suspicion for STI (not sexually active) Wet prep pending Apply Premarin 0.5 g intravaginally nightly  for 2 weeks. Discussed with PCP given hx of breast cancer. Will avoid long-term use and use lowest effective dose for shortest duration  2. Tachycardia Patient reports feeling anxious and hx of white coat syndrome No concerns on auscultation Rate improved on re-check  Patient education and anticipatory guidance given Patient agrees with treatment plan Follow-up with PCP in 2 weeks or sooner as needed if symptoms worsen or fail to improve  Darlyne Russian PA-C

## 2019-09-03 LAB — WET PREP FOR TRICH, YEAST, CLUE
MICRO NUMBER:: 10004390
Specimen Quality: ADEQUATE

## 2019-09-07 ENCOUNTER — Other Ambulatory Visit: Payer: Self-pay | Admitting: Physician Assistant

## 2019-09-07 DIAGNOSIS — M545 Low back pain, unspecified: Secondary | ICD-10-CM

## 2019-09-10 ENCOUNTER — Other Ambulatory Visit: Payer: Self-pay

## 2019-09-10 DIAGNOSIS — G500 Trigeminal neuralgia: Secondary | ICD-10-CM

## 2019-09-10 DIAGNOSIS — M542 Cervicalgia: Secondary | ICD-10-CM

## 2019-09-10 DIAGNOSIS — G8929 Other chronic pain: Secondary | ICD-10-CM

## 2019-09-10 DIAGNOSIS — M5136 Other intervertebral disc degeneration, lumbar region: Secondary | ICD-10-CM

## 2019-09-10 MED ORDER — HYDROCODONE-ACETAMINOPHEN 5-325 MG PO TABS: ORAL_TABLET | ORAL | 0 refills | Status: DC

## 2019-09-13 DIAGNOSIS — C50512 Malignant neoplasm of lower-outer quadrant of left female breast: Secondary | ICD-10-CM | POA: Diagnosis not present

## 2019-09-13 DIAGNOSIS — T386X5A Adverse effect of antigonadotrophins, antiestrogens, antiandrogens, not elsewhere classified, initial encounter: Secondary | ICD-10-CM | POA: Diagnosis not present

## 2019-09-13 DIAGNOSIS — N952 Postmenopausal atrophic vaginitis: Secondary | ICD-10-CM | POA: Diagnosis not present

## 2019-09-13 DIAGNOSIS — M818 Other osteoporosis without current pathological fracture: Secondary | ICD-10-CM | POA: Diagnosis not present

## 2019-09-13 DIAGNOSIS — Z842 Family history of other diseases of the genitourinary system: Secondary | ICD-10-CM | POA: Diagnosis not present

## 2019-09-13 DIAGNOSIS — Z5181 Encounter for therapeutic drug level monitoring: Secondary | ICD-10-CM | POA: Diagnosis not present

## 2019-09-13 DIAGNOSIS — Z170 Estrogen receptor positive status [ER+]: Secondary | ICD-10-CM | POA: Diagnosis not present

## 2019-09-13 DIAGNOSIS — C771 Secondary and unspecified malignant neoplasm of intrathoracic lymph nodes: Secondary | ICD-10-CM | POA: Diagnosis not present

## 2019-09-26 ENCOUNTER — Ambulatory Visit (INDEPENDENT_AMBULATORY_CARE_PROVIDER_SITE_OTHER): Payer: Medicare HMO | Admitting: Family Medicine

## 2019-09-26 ENCOUNTER — Other Ambulatory Visit: Payer: Self-pay

## 2019-09-26 ENCOUNTER — Encounter: Payer: Self-pay | Admitting: Family Medicine

## 2019-09-26 VITALS — BP 132/74 | HR 110 | Ht 67.0 in | Wt 135.0 lb

## 2019-09-26 DIAGNOSIS — N952 Postmenopausal atrophic vaginitis: Secondary | ICD-10-CM

## 2019-09-26 NOTE — Progress Notes (Signed)
Established Patient Office Visit  Subjective:  Patient ID: [name], female    DOB: 08-15-1946  Age: 74 y.o. MRN: [id number]  CC:  Chief Complaint  Patient presents with  . Follow-up     HPI [name] presents for vaginal dryness and discomfort.  She was actually seen by one of my partners earlier this month.  Has a history of breast cancer and recurrent UTIs and is on Trimpex prophylactically for that.  She had recommended using the Premarin cream around the vaginal area as well as just the urethral area which she had been using it for for UTI prophylaxis since November.  She says she became uncomfortable with using it because of her breast cancer history and has been using over-the-counter itch cream instead, which she feels helps.  WEt Prep was negative.     Past Medical History:  Diagnosis Date  . Breast cancer ([place]) 1999   T1N0 LEFT BREAST  . Cancer ([place]) 03-2006   METASTATIC BREAST TO APPARENT ISOLATED LEFT INTERNAL MAMMARY NODE  . Degenerative disc disease   . History of bladder infections    RECURRENT  . Hyperlipidemia   . Osteoporosis 03-04-2011    LAST BONE DENSITY 03-04-2011  . Recurrent sinusitis   . Thyroid disease   . Trigeminal neuralgia     Past Surgical History:  Procedure Laterality Date  . ABDOMINAL HYSTERECTOMY  2005   Complete  . BLADDER SUSPENSION  2005  . BREAST LUMPECTOMY Left 1999   malignant  . BREAST SURGERY  1999   Lumpectomy L Br  . BREAST SURGERY  2007   Lymph Node Removal L Chest Wall  . cancerous LN removed  2008  . left breast lumpectomy  1999    Family History  Problem Relation Age of Onset  . Cancer Mother        breast  . Stroke Mother   . Hyperlipidemia Mother   . Cancer Father        bladder  . Colon cancer Father   . Cancer Paternal Grandmother        Mouth  . Cancer Son        Breast  . Cancer Maternal Aunt        Breast  . Stomach cancer Paternal Uncle   . Stomach cancer Paternal Uncle     Social History    Socioeconomic History  . Marital status: Married    Spouse name: [name]   . Number of children: 3  . Years of education: Therapist, sports  . Highest education level: Not on file  Occupational History    Comment: Retired- Therapist, sports  Tobacco Use  . Smoking status: Never Smoker  . Smokeless tobacco: Never Used  Substance and Sexual Activity  . Alcohol use: No    Alcohol/week: 0.0 standard drinks  . Drug use: No  . Sexual activity: Not on file    Comment: housewife, married, 3 kids.  Other Topics Concern  . Not on file  Social History Narrative   Patient lives at home with her husband [name])   Retired - RN   Right handed.   Caffeine two cups coffee daily and one sweet tea.   Social Determinants of Health   Financial Resource Strain:   . Difficulty of Paying Living Expenses: Not on file  Food Insecurity:   . Worried About Charity fundraiser in the Last Year: Not on file  . Ran Out of Food in the Last Year:  Not on file  Transportation Needs:   . Lack of Transportation (Medical): Not on file  . Lack of Transportation (Non-Medical): Not on file  Physical Activity:   . Days of Exercise per Week: Not on file  . Minutes of Exercise per Session: Not on file  Stress:   . Feeling of Stress : Not on file  Social Connections:   . Frequency of Communication with Friends and Family: Not on file  . Frequency of Social Gatherings with Friends and Family: Not on file  . Attends Religious Services: Not on file  . Active Member of Clubs or Organizations: Not on file  . Attends Archivist Meetings: Not on file  . Marital Status: Not on file  Intimate Partner Violence:   . Fear of Current or Ex-Partner: Not on file  . Emotionally Abused: Not on file  . Physically Abused: Not on file  . Sexually Abused: Not on file    Outpatient Medications Prior to Visit  Medication Sig Dispense Refill  . azelastine (ASTELIN) 0.1 % nasal spray Place 2 sprays into both nostrils daily.    .  Calcium-Magnesium-Vitamin D (CALCIUM 1200+D3 PO) Take by mouth daily.     . carbamazepine (TEGRETOL XR) 100 MG 12 hr tablet Take 100 mg by mouth 2 (two) times daily.    . cyclobenzaprine (FLEXERIL) 5 MG tablet Take 1 tablet (5 mg total) by mouth 3 (three) times daily as needed for muscle spasms. 30 tablet 0  . denosumab (PROLIA) 60 MG/ML SOLN injection Inject 60 mg into the skin every 6 (six) months. Administer in upper arm, thigh, or abdomen    . EPINEPHrine (EPIPEN 2-PAK) 0.3 mg/0.3 mL IJ SOAJ injection Inject 0.3 mls (0.3 mg total) into the muscles AS NEEDED (allergic reaction) 2 Device prn  . fish oil-omega-3 fatty acids 1000 MG capsule Take 2 g by mouth daily.      . fluticasone (FLONASE) 50 MCG/ACT nasal spray Place 1 spray into both nostrils daily.  1  . HYDROcodone-acetaminophen (NORCO/VICODIN) 5-325 MG tablet TAKE 1 TABLET EVERY TWELVE HOURS AS NEEDED 60 tablet 0  . latanoprost (XALATAN) 0.005 % ophthalmic solution     . levocetirizine (XYZAL) 5 MG tablet Take 5 mg by mouth daily.      Marland Kitchen levothyroxine (SYNTHROID) 50 MCG tablet Take 1 tablet (50 mcg total) by mouth daily. Take extra half a tab a week. 96 tablet 0  . meloxicam (MOBIC) 15 MG tablet TAKE 1 TABLET BY MOUTH EVERY DAY 30 tablet 0  . omeprazole (PRILOSEC) 40 MG capsule Take 1 capsule (40 mg total) by mouth daily. 90 capsule 3  . pravastatin (PRAVACHOL) 40 MG tablet Take 1 tablet (40 mg total) by mouth daily. 90 tablet 3  . senna-docusate (SENOKOT-S) 8.6-50 MG tablet Take 2 tablets by mouth at bedtime.    . tamoxifen (NOLVADEX) 20 MG tablet Take 1 tablet (20 mg total) by mouth daily. 90 tablet 3  . triamterene-hydrochlorothiazide (MAXZIDE-25) 37.5-25 MG tablet Take 1 tablet by mouth daily. 90 tablet 3  . trimethoprim (TRIMPEX) 100 MG tablet Take 100 mg by mouth daily.     No facility-administered medications prior to visit.    Allergies  Allergen Reactions  . Simvastatin Other (See Comments)    Aches and memory loss  .  Azithromycin Hives  . Doxycycline Itching  . Other Rash  . Trazodone And Nefazodone Other (See Comments)    Memory impairment and palpitations.   . Ampicillin Rash  .  Cefadroxil Rash  . Dicloxacillin Rash    ROS Review of Systems    Objective:    Physical Exam  Constitutional: She is oriented to person, place, and time. She appears well-developed and well-nourished.  HENT:  Head: Normocephalic and atraumatic.  Eyes: Conjunctivae and EOM are normal.  Cardiovascular: Normal rate.  Pulmonary/Chest: Effort normal.  Neurological: She is alert and oriented to person, place, and time.  Skin: Skin is dry. No pallor.  Psychiatric: She has a normal mood and affect. Her behavior is normal.  Vitals reviewed.   BP 132/74   Pulse (!) 110   Ht '5\' 7"'  (1.702 m)   Wt 135 lb (61.2 kg)   SpO2 98%   BMI 21.14 kg/m  Wt Readings from Last 3 Encounters:  09/26/19 135 lb (61.2 kg)  09/02/19 135 lb (61.2 kg)  08/12/19 135 lb (61.2 kg)     Health Maintenance Due  Topic Date Due  . Hepatitis C Screening  [birth year]  . DEXA SCAN  04/27/2019    There are no preventive care reminders to display for this patient.  Lab Results  Component Value Date   TSH 2.04 07/10/2019   Lab Results  Component Value Date   WBC 3.9 04/04/2017   HGB 12.3 04/04/2017   HCT 37.2 04/04/2017   MCV 93.9 04/04/2017   PLT 207 04/04/2017   Lab Results  Component Value Date   NA 139 01/22/2019   K 4.2 01/22/2019   CHLORIDE 103 01/31/2017   CO2 30 01/22/2019   GLUCOSE 94 01/22/2019   BUN 19 01/22/2019   CREATININE 0.89 01/22/2019   BILITOT 0.5 01/22/2019   ALKPHOS 29 (L) 04/04/2017   AST 24 01/22/2019   ALT 12 01/22/2019   PROT 6.9 01/22/2019   ALBUMIN 4.8 04/04/2017   CALCIUM 10.1 01/22/2019   ANIONGAP 9 01/31/2017   EGFR 54 (L) 01/31/2017   Lab Results  Component Value Date   CHOL 289 (H) 04/09/2019   Lab Results  Component Value Date   HDL 81 04/09/2019   Lab Results  Component Value  Date   LDLCALC 169 (H) 04/09/2019   Lab Results  Component Value Date   TRIG 216 (H) 04/09/2019   Lab Results  Component Value Date   CHOLHDL 3.6 04/09/2019   No results found for: HGBA1C    Assessment & Plan:   Problem List Items Addressed This Visit      Genitourinary   Atrophic vaginitis    Discuss the use of premarin.  Very little hormone goes systemic so OK to use in small amt even with hx of BrCa. We also discuss also using a feminine moisturizer like Replens. I am not sure what the major ingredient in the OTC vaginal anti-itch cream is. Gave her reassurance.  Can use twice a week safely.         Other Visit Diagnoses    Vaginal atrophy    -  Primary      No orders of the defined types were placed in this encounter.   Follow-up: No follow-ups on file.   Time spent 20 mim in encounter.    Beatrice Lecher, MD

## 2019-09-26 NOTE — Assessment & Plan Note (Signed)
Discuss the use of premarin.  Very little hormone goes systemic so OK to use in small amt even with hx of BrCa. We also discuss also using a feminine moisturizer like Replens. I am not sure what the major ingredient in the OTC vaginal anti-itch cream is. Gave her reassurance.  Can use twice a week safely.

## 2019-09-26 NOTE — Patient Instructions (Signed)
Can try replens for vaginal moisture.    Use the premarin twice a week around the vaginal opening

## 2019-09-26 NOTE — Progress Notes (Signed)
She reports that she didn't want to use the Premarin cream due to her Hx of Cancer. She has been using an OTC vaginal itch cream and stated that this works well for her.

## 2019-10-03 ENCOUNTER — Other Ambulatory Visit: Payer: Self-pay | Admitting: Family Medicine

## 2019-10-03 DIAGNOSIS — M545 Low back pain, unspecified: Secondary | ICD-10-CM

## 2019-10-04 ENCOUNTER — Other Ambulatory Visit: Payer: Self-pay | Admitting: Family Medicine

## 2019-10-04 DIAGNOSIS — E039 Hypothyroidism, unspecified: Secondary | ICD-10-CM

## 2019-10-14 DIAGNOSIS — H6123 Impacted cerumen, bilateral: Secondary | ICD-10-CM | POA: Diagnosis not present

## 2019-10-14 DIAGNOSIS — H9041 Sensorineural hearing loss, unilateral, right ear, with unrestricted hearing on the contralateral side: Secondary | ICD-10-CM | POA: Diagnosis not present

## 2019-10-14 DIAGNOSIS — H8101 Meniere's disease, right ear: Secondary | ICD-10-CM | POA: Diagnosis not present

## 2019-10-16 ENCOUNTER — Other Ambulatory Visit: Payer: Self-pay

## 2019-10-16 DIAGNOSIS — M5136 Other intervertebral disc degeneration, lumbar region: Secondary | ICD-10-CM

## 2019-10-16 DIAGNOSIS — G8929 Other chronic pain: Secondary | ICD-10-CM

## 2019-10-16 DIAGNOSIS — M542 Cervicalgia: Secondary | ICD-10-CM

## 2019-10-16 DIAGNOSIS — G500 Trigeminal neuralgia: Secondary | ICD-10-CM

## 2019-10-16 MED ORDER — HYDROCODONE-ACETAMINOPHEN 5-325 MG PO TABS: ORAL_TABLET | ORAL | 0 refills | Status: DC

## 2019-11-08 DIAGNOSIS — L821 Other seborrheic keratosis: Secondary | ICD-10-CM | POA: Diagnosis not present

## 2019-11-08 DIAGNOSIS — D229 Melanocytic nevi, unspecified: Secondary | ICD-10-CM | POA: Diagnosis not present

## 2019-11-08 DIAGNOSIS — D1801 Hemangioma of skin and subcutaneous tissue: Secondary | ICD-10-CM | POA: Diagnosis not present

## 2019-11-08 DIAGNOSIS — Z85828 Personal history of other malignant neoplasm of skin: Secondary | ICD-10-CM | POA: Diagnosis not present

## 2019-11-08 DIAGNOSIS — L905 Scar conditions and fibrosis of skin: Secondary | ICD-10-CM | POA: Diagnosis not present

## 2019-11-13 ENCOUNTER — Other Ambulatory Visit: Payer: Self-pay

## 2019-11-13 ENCOUNTER — Ambulatory Visit (INDEPENDENT_AMBULATORY_CARE_PROVIDER_SITE_OTHER): Payer: Medicare HMO | Admitting: Family Medicine

## 2019-11-13 ENCOUNTER — Encounter: Payer: Self-pay | Admitting: Family Medicine

## 2019-11-13 VITALS — BP 116/59 | HR 119 | Ht 67.0 in | Wt 135.0 lb

## 2019-11-13 DIAGNOSIS — M542 Cervicalgia: Secondary | ICD-10-CM | POA: Diagnosis not present

## 2019-11-13 DIAGNOSIS — G500 Trigeminal neuralgia: Secondary | ICD-10-CM | POA: Diagnosis not present

## 2019-11-13 DIAGNOSIS — E039 Hypothyroidism, unspecified: Secondary | ICD-10-CM | POA: Diagnosis not present

## 2019-11-13 DIAGNOSIS — G8929 Other chronic pain: Secondary | ICD-10-CM

## 2019-11-13 DIAGNOSIS — M5136 Other intervertebral disc degeneration, lumbar region: Secondary | ICD-10-CM

## 2019-11-13 MED ORDER — LEVOTHYROXINE SODIUM 50 MCG PO TABS: 50.0000 ug | ORAL_TABLET | Freq: Every day | ORAL | 0 refills | Status: DC

## 2019-11-13 MED ORDER — HYDROCODONE-ACETAMINOPHEN 5-325 MG PO TABS: ORAL_TABLET | ORAL | 0 refills | Status: DC

## 2019-11-13 NOTE — Progress Notes (Signed)
Established Patient Office Visit  Subjective:  Patient ID: [name], female    DOB: 12-Oct-1945  Age: 74 y.o. MRN: [id number]  CC: No chief complaint on file.   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  She is currently on hydrocodone 5/325 twice a day.  Last prescription filled on February 17.  She is due for refills today.  She does use a a stimulant to help with her bowels.  Past Medical History:  Diagnosis Date  . Breast cancer ([place]) 1999   T1N0 LEFT BREAST  . Cancer ([facility]) 03-2006   METASTATIC BREAST TO APPARENT ISOLATED LEFT INTERNAL MAMMARY NODE  . Degenerative disc disease   . History of bladder infections    RECURRENT  . Hyperlipidemia   . Osteoporosis 03-04-2011    LAST BONE DENSITY 03-04-2011  . Recurrent sinusitis   . Thyroid disease   . Trigeminal neuralgia     Past Surgical History:  Procedure Laterality Date  . ABDOMINAL HYSTERECTOMY  2005   Complete  . BLADDER SUSPENSION  2005  . BREAST LUMPECTOMY Left 1999   malignant  . BREAST SURGERY  1999   Lumpectomy L Br  . BREAST SURGERY  2007   Lymph Node Removal L Chest Wall  . cancerous LN removed  2008  . left breast lumpectomy  1999    Family History  Problem Relation Age of Onset  . Cancer Mother        breast  . Stroke Mother   . Hyperlipidemia Mother   . Cancer Father        bladder  . Colon cancer Father   . Cancer Paternal Grandmother        Mouth  . Cancer Son        Breast  . Cancer Maternal Aunt        Breast  . Stomach cancer Paternal Uncle   . Stomach cancer Paternal Uncle     Social History   Socioeconomic History  . Marital status: Married    Spouse name: [name]   . Number of children: 3  . Years of education: Therapist, sports  . Highest education level: Not on file  Occupational History    Comment: Retired- Therapist, sports  Tobacco Use  . Smoking status: Never Smoker  . Smokeless tobacco: Never Used  Substance and  Sexual Activity  . Alcohol use: No    Alcohol/week: 0.0 standard drinks  . Drug use: No  . Sexual activity: Not on file    Comment: housewife, married, 3 kids.  Other Topics Concern  . Not on file  Social History Narrative   Patient lives at home with her husband [name])   Retired - RN   Right handed.   Caffeine two cups coffee daily and one sweet tea.   Social Determinants of Health   Financial Resource Strain:   . Difficulty of Paying Living Expenses:   Food Insecurity:   . Worried About Charity fundraiser in the Last Year:   . Arboriculturist in the Last Year:   Transportation Needs:   . Film/video editor (Medical):   Marland Kitchen Lack of Transportation (Non-Medical):   Physical Activity:   . Days of Exercise per Week:   . Minutes of Exercise per Session:   Stress:   . Feeling of Stress :   Social Connections:   . Frequency of Communication with Friends and Family:   .  Frequency of Social Gatherings with Friends and Family:   . Attends Religious Services:   . Active Member of Clubs or Organizations:   . Attends Archivist Meetings:   Marland Kitchen Marital Status:   Intimate Partner Violence:   . Fear of Current or Ex-Partner:   . Emotionally Abused:   Marland Kitchen Physically Abused:   . Sexually Abused:     Outpatient Medications Prior to Visit  Medication Sig Dispense Refill  . dorzolamide-timolol (COSOPT) 22.3-6.8 MG/ML ophthalmic solution 1 drop 2 (two) times daily.    Marland Kitchen PREMARIN vaginal cream INERT 1 APPLICATORFUL VAGINALLY NIGHTLY 1 WEEK & THEN 3 NIGHTS A WEEK THEREAFTER    . timolol (TIMOPTIC) 0.25 % ophthalmic solution 1 drop every morning.    Marland Kitchen azelastine (ASTELIN) 0.1 % nasal spray Place 2 sprays into both nostrils daily.    . Calcium-Magnesium-Vitamin D (CALCIUM 1200+D3 PO) Take by mouth daily.     . carbamazepine (TEGRETOL XR) 100 MG 12 hr tablet Take 100 mg by mouth 2 (two) times daily.    Marland Kitchen denosumab (PROLIA) 60 MG/ML SOLN injection Inject 60 mg into the skin every 6  (six) months. Administer in upper arm, thigh, or abdomen    . EPINEPHrine (EPIPEN 2-PAK) 0.3 mg/0.3 mL IJ SOAJ injection Inject 0.3 mls (0.3 mg total) into the muscles AS NEEDED (allergic reaction) 2 Device prn  . fish oil-omega-3 fatty acids 1000 MG capsule Take 2 g by mouth daily.      . fluticasone (FLONASE) 50 MCG/ACT nasal spray Place 1 spray into both nostrils daily.  1  . latanoprost (XALATAN) 0.005 % ophthalmic solution     . levocetirizine (XYZAL) 5 MG tablet Take 5 mg by mouth daily.      Marland Kitchen omeprazole (PRILOSEC) 40 MG capsule Take 1 capsule (40 mg total) by mouth daily. 90 capsule 3  . pravastatin (PRAVACHOL) 40 MG tablet Take 1 tablet (40 mg total) by mouth daily. 90 tablet 3  . senna-docusate (SENOKOT-S) 8.6-50 MG tablet Take 2 tablets by mouth at bedtime.    . tamoxifen (NOLVADEX) 20 MG tablet Take 1 tablet (20 mg total) by mouth daily. 90 tablet 3  . triamterene-hydrochlorothiazide (MAXZIDE-25) 37.5-25 MG tablet Take 1 tablet by mouth daily. 90 tablet 3  . trimethoprim (TRIMPEX) 100 MG tablet Take 100 mg by mouth daily.    . cyclobenzaprine (FLEXERIL) 5 MG tablet Take 1 tablet (5 mg total) by mouth 3 (three) times daily as needed for muscle spasms. 30 tablet 0  . HYDROcodone-acetaminophen (NORCO/VICODIN) 5-325 MG tablet TAKE 1 TABLET EVERY TWELVE HOURS AS NEEDED 60 tablet 0  . levothyroxine (SYNTHROID) 50 MCG tablet TAKE 1 TABLET (50 MCG TOTAL) BY MOUTH DAILY. TAKE EXTRA 1/2 TAB A WEEK. 96 tablet 0  . meloxicam (MOBIC) 15 MG tablet TAKE 1 TABLET BY MOUTH EVERY DAY 30 tablet 0   No facility-administered medications prior to visit.    Allergies  Allergen Reactions  . Simvastatin Other (See Comments)    Aches and memory loss  . Azithromycin Hives  . Doxycycline Itching  . Other Rash  . Trazodone And Nefazodone Other (See Comments)    Memory impairment and palpitations.   . Ampicillin Rash  . Cefadroxil Rash  . Dicloxacillin Rash    ROS Review of Systems    Objective:     Physical Exam  BP (!) 116/59   Pulse (!) 119   Ht '5\' 7"'  (1.702 m)   Wt 135 lb (61.2 kg)  SpO2 97%   BMI 21.14 kg/m  Wt Readings from Last 3 Encounters:  11/13/19 135 lb (61.2 kg)  09/26/19 135 lb (61.2 kg)  09/02/19 135 lb (61.2 kg)     Health Maintenance Due  Topic Date Due  . Hepatitis C Screening  Never done  . DEXA SCAN  04/27/2019    There are no preventive care reminders to display for this patient.  Lab Results  Component Value Date   TSH 2.04 07/10/2019   Lab Results  Component Value Date   WBC 3.9 04/04/2017   HGB 12.3 04/04/2017   HCT 37.2 04/04/2017   MCV 93.9 04/04/2017   PLT 207 04/04/2017   Lab Results  Component Value Date   NA 139 01/22/2019   K 4.2 01/22/2019   CHLORIDE 103 01/31/2017   CO2 30 01/22/2019   GLUCOSE 94 01/22/2019   BUN 19 01/22/2019   CREATININE 0.89 01/22/2019   BILITOT 0.5 01/22/2019   ALKPHOS 29 (L) 04/04/2017   AST 24 01/22/2019   ALT 12 01/22/2019   PROT 6.9 01/22/2019   ALBUMIN 4.8 04/04/2017   CALCIUM 10.1 01/22/2019   ANIONGAP 9 01/31/2017   EGFR 54 (L) 01/31/2017   Lab Results  Component Value Date   CHOL 289 (H) 04/09/2019   Lab Results  Component Value Date   HDL 81 04/09/2019   Lab Results  Component Value Date   LDLCALC 169 (H) 04/09/2019   Lab Results  Component Value Date   TRIG 216 (H) 04/09/2019   Lab Results  Component Value Date   CHOLHDL 3.6 04/09/2019   No results found for: HGBA1C    Assessment & Plan:   Problem List Items Addressed This Visit      Endocrine   Hypothyroidism   Relevant Medications   levothyroxine (SYNTHROID) 50 MCG tablet     Nervous and Auditory   NEURALGIA, TRIGEMINAL   Relevant Medications   HYDROcodone-acetaminophen (NORCO/VICODIN) 5-325 MG tablet     Musculoskeletal and Integument   Degenerative disc disease, lumbar - Primary   Relevant Medications   HYDROcodone-acetaminophen (NORCO/VICODIN) 5-325 MG tablet   Other Relevant Orders   Pain  Mgmt, Profile 5 w/o medMatch U     Other   NECK PAIN   Relevant Medications   HYDROcodone-acetaminophen (NORCO/VICODIN) 5-325 MG tablet   Encounter for chronic pain management    Indication for chronic opioid:Chronic neck and back pain  medication and dose:Hydrocodone 5/325. # pills per month:60 Last UDS date:8/20. Opioid Treatment Agreement signed (Y/N):Y Opioid Treatment Agreement last reviewed with patient:10/03/2018 F/U in 3 mo. NCCSRS reviewed this encounter (include red flags):No, system was down today.       Relevant Medications   HYDROcodone-acetaminophen (NORCO/VICODIN) 5-325 MG tablet   Other Relevant Orders   Pain Mgmt, Profile 5 w/o medMatch U      Meds ordered this encounter  Medications  . HYDROcodone-acetaminophen (NORCO/VICODIN) 5-325 MG tablet    Sig: TAKE 1 TABLET EVERY TWELVE HOURS AS NEEDED    Dispense:  60 tablet    Refill:  0  . levothyroxine (SYNTHROID) 50 MCG tablet    Sig: Take 1 tablet (50 mcg total) by mouth daily before breakfast.    Dispense:  90 tablet    Refill:  0    Follow-up: Return in about 3 months (around 02/13/2020) for Chronic pain  management. .   Time time spent 20 min in encounter.   Beatrice Lecher, MD

## 2019-11-13 NOTE — Assessment & Plan Note (Signed)
Indication for chronic opioid:Chronic neck and back pain  medication and dose:Hydrocodone 5/325. # pills per month:60 Last UDS date:8/20. Opioid Treatment Agreement signed (Y/N):Y Opioid Treatment Agreement last reviewed with patient:10/03/2018 F/U in 3 mo. NCCSRS reviewed this encounter (include red flags):No, system was down today.

## 2019-11-14 ENCOUNTER — Telehealth: Payer: Self-pay

## 2019-11-14 ENCOUNTER — Ambulatory Visit: Payer: Medicare HMO | Admitting: Family Medicine

## 2019-11-14 DIAGNOSIS — N3020 Other chronic cystitis without hematuria: Secondary | ICD-10-CM | POA: Diagnosis not present

## 2019-11-14 DIAGNOSIS — R300 Dysuria: Secondary | ICD-10-CM | POA: Diagnosis not present

## 2019-11-14 NOTE — Telephone Encounter (Signed)
[name] states [name]'s short term memory is getting worse. He states she will not admit that she has a problem. He states he would like her evaluated but we would have to call him next week and ask them to come in for a follow up appointment.

## 2019-11-15 NOTE — Telephone Encounter (Signed)
I would recommend that we get her scheduled for follow-up memory.  You can remind her that we did a evaluation in November and it is just time to do the routine follow-up.  I would encourage him to come in with her so that he can express some of his concerns in front of her to me.  The thing that would help open up the conversation if he feels comfortable with that.  Last time we did her evaluation in November she did not perform that well and we had discussed further work-up but at the time she felt like it was somewhat situational and she felt particularly stressed that day and wanted to hold off on any imaging etc.  But it is time to do a repeat follow-up to see where we are at.

## 2019-11-16 LAB — PAIN MGMT, PROFILE 5 W/O MEDMATCH U
Amphetamines: NEGATIVE ng/mL
Barbiturates: NEGATIVE ng/mL
Benzodiazepines: NEGATIVE ng/mL
Cocaine Metabolite: NEGATIVE ng/mL
Codeine: NEGATIVE ng/mL
Creatinine: 45.8 mg/dL
Hydrocodone: NEGATIVE ng/mL
Hydromorphone: NEGATIVE ng/mL
Marijuana Metabolite: NEGATIVE ng/mL
Methadone Metabolite: NEGATIVE ng/mL
Morphine: NEGATIVE ng/mL
Norhydrocodone: 89 ng/mL
Opiates: POSITIVE ng/mL
Oxidant: NEGATIVE ug/mL
Oxycodone: NEGATIVE ng/mL
pH: 7.1 (ref 4.5–9.0)

## 2019-11-19 ENCOUNTER — Encounter: Payer: Self-pay | Admitting: Family Medicine

## 2019-11-19 ENCOUNTER — Ambulatory Visit (INDEPENDENT_AMBULATORY_CARE_PROVIDER_SITE_OTHER): Payer: Medicare HMO | Admitting: Family Medicine

## 2019-11-19 VITALS — BP 134/72 | HR 100 | Temp 98.1°F | Wt 138.0 lb

## 2019-11-19 DIAGNOSIS — R413 Other amnesia: Secondary | ICD-10-CM | POA: Diagnosis not present

## 2019-11-19 DIAGNOSIS — G3184 Mild cognitive impairment, so stated: Secondary | ICD-10-CM | POA: Diagnosis not present

## 2019-11-19 NOTE — Progress Notes (Signed)
Established Patient Office Visit  Subjective:  Patient ID: [name], female    DOB: 1946-01-03  Age: 74 y.o. MRN: [id number]  CC:  Chief Complaint  Patient presents with  . Follow-up    HPI [name] presents for 36-monthfollow-up for mild cognitive impairment.  She is here today to repeat testing with Mini-Mental status exam.  Last Mini-Mental status exam was in November score was 20/30.  This was actually a significant decline from her previous in August but she felt very anxious at the time so we discussed her coming back in 6 months to recheck everything.  Admits that she is having to write a lot more things down to remember such as appointments and events and dates otherwise she does get confused.  She does not feel like it is a big problem though.  There her husband was more concerned and had actually called uKoreathat he felt like there were some changes unfortunately he did not come in for the office visit today to give uKoreamore specifics about what he has been noticing.  Past Medical History:  Diagnosis Date  . [place] [place]   T1N0 LEFT BREAST  . [place] [place]   METASTATIC BREAST TO APPARENT ISOLATED LEFT INTERNAL MAMMARY NODE  . Degenerative disc disease   . History of bladder infections    RECURRENT  . Hyperlipidemia   . Osteoporosis 03-04-2011    LAST BONE DENSITY 03-04-2011  . Recurrent sinusitis   . Thyroid disease   . Trigeminal neuralgia     Past Surgical History:  Procedure Laterality Date  . ABDOMINAL HYSTERECTOMY  2005   Complete  . BLADDER SUSPENSION  2005  . BREAST LUMPECTOMY Left 1999   malignant  . BREAST SURGERY  1999   Lumpectomy L Br  . BREAST SURGERY  2007   Lymph Node Removal L Chest Wall  . cancerous LN removed  2008  . left breast lumpectomy  1999    Family History  Problem Relation Age of Onset  . Cancer Mother        breast  . Stroke Mother   . Hyperlipidemia Mother   . Cancer Father        bladder  . Colon  cancer Father   . Cancer Paternal Grandmother        Mouth  . Cancer Son        Breast  . Cancer Maternal Aunt        Breast  . Stomach cancer Paternal Uncle   . Stomach cancer Paternal Uncle     Social History   Socioeconomic History  . Marital status: Married    Spouse name: JSonia Side  . Number of children: 3  . Years of education: RTherapist, sports . Highest education level: Not on file  Occupational History    Comment: Retired- RTherapist, sports Tobacco Use  . Smoking status: Never Smoker  . Smokeless tobacco: Never Used  Substance and Sexual Activity  . Alcohol use: No    Alcohol/week: 0.0 standard drinks  . Drug use: No  . Sexual activity: Not on file    Comment: housewife, married, 3 kids.  Other Topics Concern  . Not on file  Social History Narrative   Patient lives at home with her husband ([name]   Retired - RN   Right handed.   Caffeine two cups coffee daily and one sweet tea.   Social Determinants of HRadio broadcast assistant  Strain:   . Difficulty of Paying Living Expenses:   Food Insecurity:   . Worried About Charity fundraiser in the Last Year:   . Arboriculturist in the Last Year:   Transportation Needs:   . Film/video editor (Medical):   Marland Kitchen Lack of Transportation (Non-Medical):   Physical Activity:   . Days of Exercise per Week:   . Minutes of Exercise per Session:   Stress:   . Feeling of Stress :   Social Connections:   . Frequency of Communication with Friends and Family:   . Frequency of Social Gatherings with Friends and Family:   . Attends Religious Services:   . Active Member of Clubs or Organizations:   . Attends Archivist Meetings:   Marland Kitchen Marital Status:   Intimate Partner Violence:   . Fear of Current or Ex-Partner:   . Emotionally Abused:   Marland Kitchen Physically Abused:   . Sexually Abused:     Outpatient Medications Prior to Visit  Medication Sig Dispense Refill  . azelastine (ASTELIN) 0.1 % nasal spray Place 2 sprays into both nostrils daily.     . Calcium-Magnesium-Vitamin D (CALCIUM 1200+D3 PO) Take by mouth daily.     . carbamazepine (TEGRETOL XR) 100 MG 12 hr tablet Take 100 mg by mouth 2 (two) times daily.    Marland Kitchen denosumab (PROLIA) 60 MG/ML SOLN injection Inject 60 mg into the skin every 6 (six) months. Administer in upper arm, thigh, or abdomen    . dorzolamide-timolol (COSOPT) 22.3-6.8 MG/ML ophthalmic solution 1 drop 2 (two) times daily.    Marland Kitchen EPINEPHrine (EPIPEN 2-PAK) 0.3 mg/0.3 mL IJ SOAJ injection Inject 0.3 mls (0.3 mg total) into the muscles AS NEEDED (allergic reaction) 2 Device prn  . fish oil-omega-3 fatty acids 1000 MG capsule Take 2 g by mouth daily.      . fluticasone (FLONASE) 50 MCG/ACT nasal spray Place 1 spray into both nostrils daily.  1  . HYDROcodone-acetaminophen (NORCO/VICODIN) 5-325 MG tablet TAKE 1 TABLET EVERY TWELVE HOURS AS NEEDED 60 tablet 0  . latanoprost (XALATAN) 0.005 % ophthalmic solution     . levocetirizine (XYZAL) 5 MG tablet Take 5 mg by mouth daily.      Marland Kitchen levothyroxine (SYNTHROID) 50 MCG tablet Take 1 tablet (50 mcg total) by mouth daily before breakfast. 90 tablet 0  . omeprazole (PRILOSEC) 40 MG capsule Take 1 capsule (40 mg total) by mouth daily. 90 capsule 3  . pravastatin (PRAVACHOL) 40 MG tablet Take 1 tablet (40 mg total) by mouth daily. 90 tablet 3  . PREMARIN vaginal cream INERT 1 APPLICATORFUL VAGINALLY NIGHTLY 1 WEEK & THEN 3 NIGHTS A WEEK THEREAFTER    . senna-docusate (SENOKOT-S) 8.6-50 MG tablet Take 2 tablets by mouth at bedtime.    . tamoxifen (NOLVADEX) 20 MG tablet Take 1 tablet (20 mg total) by mouth daily. 90 tablet 3  . timolol (TIMOPTIC) 0.25 % ophthalmic solution 1 drop every morning.    . triamterene-hydrochlorothiazide (MAXZIDE-25) 37.5-25 MG tablet Take 1 tablet by mouth daily. 90 tablet 3  . trimethoprim (TRIMPEX) 100 MG tablet Take 100 mg by mouth daily.     No facility-administered medications prior to visit.    Allergies  Allergen Reactions  . Simvastatin  Other (See Comments)    Aches and memory loss  . Azithromycin Hives  . Doxycycline Itching  . Other Rash  . Trazodone And Nefazodone Other (See Comments)    Memory impairment and  palpitations.   . Ampicillin Rash  . Cefadroxil Rash  . Dicloxacillin Rash    ROS Review of Systems    Objective:    Physical Exam  BP 134/72   Pulse 100   Temp 98.1 F (36.7 C) (Oral)   Wt 138 lb (62.6 kg)   BMI 21.61 kg/m  Wt Readings from Last 3 Encounters:  11/19/19 138 lb (62.6 kg)  11/13/19 135 lb (61.2 kg)  09/26/19 135 lb (61.2 kg)     Health Maintenance Due  Topic Date Due  . Hepatitis C Screening  Never done  . DEXA SCAN  04/27/2019    There are no preventive care reminders to display for this patient.  Lab Results  Component Value Date   TSH 2.04 07/10/2019   Lab Results  Component Value Date   WBC 3.9 04/04/2017   HGB 12.3 04/04/2017   HCT 37.2 04/04/2017   MCV 93.9 04/04/2017   PLT 207 04/04/2017   Lab Results  Component Value Date   NA 139 01/22/2019   K 4.2 01/22/2019   CHLORIDE 103 01/31/2017   CO2 30 01/22/2019   GLUCOSE 94 01/22/2019   BUN 19 01/22/2019   CREATININE 0.89 01/22/2019   BILITOT 0.5 01/22/2019   ALKPHOS 29 (L) 04/04/2017   AST 24 01/22/2019   ALT 12 01/22/2019   PROT 6.9 01/22/2019   ALBUMIN 4.8 04/04/2017   CALCIUM 10.1 01/22/2019   ANIONGAP 9 01/31/2017   EGFR 54 (L) 01/31/2017   Lab Results  Component Value Date   CHOL 289 (H) 04/09/2019   Lab Results  Component Value Date   HDL 81 04/09/2019   Lab Results  Component Value Date   LDLCALC 169 (H) 04/09/2019   Lab Results  Component Value Date   TRIG 216 (H) 04/09/2019   Lab Results  Component Value Date   CHOLHDL 3.6 04/09/2019   No results found for: HGBA1C    Assessment & Plan:   Problem List Items Addressed This Visit      Other   Mild cognitive impairment    Discussed options today.  I do think she would benefit from an MRI of the brain just to rule out  any other causes or anything that we might be able to treat or make changes in her medical regimen over the next few years that might change her outcome.  She does have a mother who developed advanced dementia and ended up being in a nursing home.  Continue to work on strategies such as keeping her calendar and looking at it frequently to help her stay organized       Other Visit Diagnoses    MCI (mild cognitive impairment)    -  Primary   Relevant Orders   MR Brain W Wo Contrast   Memory loss       Relevant Orders   MR Brain W Wo Contrast      No orders of the defined types were placed in this encounter.  Spent 25 minutes in encounter.  Follow-up: Return if symptoms worsen or fail to improve.    Beatrice Lecher, MD

## 2019-11-19 NOTE — Patient Instructions (Signed)
You will receive a call from [facility] imaging to get you scheduled for the MRI once we get this approved.  It may take a few days to receive that phone call.

## 2019-11-19 NOTE — Assessment & Plan Note (Signed)
Discussed options today.  I do think she would benefit from an MRI of the brain just to rule out any other causes or anything that we might be able to treat or make changes in her medical regimen over the next few years that might change her outcome.  She does have a mother who developed advanced dementia and ended up being in a nursing home.  Continue to work on strategies such as keeping her calendar and looking at it frequently to help her stay organized

## 2019-11-19 NOTE — Telephone Encounter (Signed)
Patient scheduled.

## 2019-11-20 ENCOUNTER — Other Ambulatory Visit: Payer: Self-pay | Admitting: Family Medicine

## 2019-11-20 DIAGNOSIS — G3184 Mild cognitive impairment, so stated: Secondary | ICD-10-CM

## 2019-11-20 DIAGNOSIS — R413 Other amnesia: Secondary | ICD-10-CM

## 2019-11-20 NOTE — Progress Notes (Signed)
Labs needed for imaging requiring contrast

## 2019-11-21 DIAGNOSIS — H401131 Primary open-angle glaucoma, bilateral, mild stage: Secondary | ICD-10-CM | POA: Diagnosis not present

## 2019-12-04 DIAGNOSIS — G3184 Mild cognitive impairment, so stated: Secondary | ICD-10-CM | POA: Diagnosis not present

## 2019-12-04 DIAGNOSIS — R413 Other amnesia: Secondary | ICD-10-CM | POA: Diagnosis not present

## 2019-12-04 LAB — COMPLETE METABOLIC PANEL WITH GFR
AG Ratio: 2.5 (calc) (ref 1.0–2.5)
ALT: 10 U/L (ref 6–29)
AST: 19 U/L (ref 10–35)
Albumin: 4.5 g/dL (ref 3.6–5.1)
Alkaline phosphatase (APISO): 46 U/L (ref 37–153)
BUN: 12 mg/dL (ref 7–25)
CO2: 29 mmol/L (ref 20–32)
Calcium: 9.1 mg/dL (ref 8.6–10.4)
Chloride: 105 mmol/L (ref 98–110)
Creat: 0.81 mg/dL (ref 0.60–0.93)
GFR, Est African American: 84 mL/min/{1.73_m2} (ref >=60)
GFR, Est Non African American: 72 mL/min/{1.73_m2} (ref >=60)
Globulin: 1.8 g/dL (calc) — ABNORMAL LOW (ref 1.9–3.7)
Glucose, Bld: 94 mg/dL (ref 65–99)
Potassium: 4.3 mmol/L (ref 3.5–5.3)
Sodium: 143 mmol/L (ref 135–146)
Total Bilirubin: 0.3 mg/dL (ref 0.2–1.2)
Total Protein: 6.3 g/dL (ref 6.1–8.1)

## 2019-12-06 NOTE — Progress Notes (Signed)
CMP looks good. Globulin just a hair low. Will watch.

## 2019-12-09 ENCOUNTER — Other Ambulatory Visit: Payer: Medicare HMO

## 2019-12-16 ENCOUNTER — Other Ambulatory Visit: Payer: Self-pay

## 2019-12-16 ENCOUNTER — Ambulatory Visit (INDEPENDENT_AMBULATORY_CARE_PROVIDER_SITE_OTHER): Payer: Medicare HMO

## 2019-12-16 DIAGNOSIS — G3184 Mild cognitive impairment, so stated: Secondary | ICD-10-CM

## 2019-12-16 DIAGNOSIS — R413 Other amnesia: Secondary | ICD-10-CM | POA: Diagnosis not present

## 2019-12-16 MED ORDER — GADOBUTROL 1 MMOL/ML IV SOLN
6.0000 mL | Freq: Once | INTRAVENOUS | Status: AC | PRN
  Administered 2019-12-16: 6 mL via INTRAVENOUS

## 2019-12-23 ENCOUNTER — Other Ambulatory Visit: Payer: Self-pay

## 2019-12-23 DIAGNOSIS — G500 Trigeminal neuralgia: Secondary | ICD-10-CM

## 2019-12-23 DIAGNOSIS — M542 Cervicalgia: Secondary | ICD-10-CM

## 2019-12-23 DIAGNOSIS — G8929 Other chronic pain: Secondary | ICD-10-CM

## 2019-12-23 DIAGNOSIS — M5136 Other intervertebral disc degeneration, lumbar region: Secondary | ICD-10-CM

## 2019-12-23 MED ORDER — HYDROCODONE-ACETAMINOPHEN 5-325 MG PO TABS: ORAL_TABLET | ORAL | 0 refills | Status: DC

## 2019-12-27 DIAGNOSIS — R69 Illness, unspecified: Secondary | ICD-10-CM | POA: Diagnosis not present

## 2020-01-03 ENCOUNTER — Other Ambulatory Visit: Payer: Self-pay | Admitting: Family Medicine

## 2020-01-03 DIAGNOSIS — E039 Hypothyroidism, unspecified: Secondary | ICD-10-CM

## 2020-01-03 DIAGNOSIS — E782 Mixed hyperlipidemia: Secondary | ICD-10-CM

## 2020-01-09 DIAGNOSIS — Z79899 Other long term (current) drug therapy: Secondary | ICD-10-CM | POA: Diagnosis not present

## 2020-01-09 DIAGNOSIS — G500 Trigeminal neuralgia: Secondary | ICD-10-CM | POA: Diagnosis not present

## 2020-01-09 DIAGNOSIS — G501 Atypical facial pain: Secondary | ICD-10-CM | POA: Diagnosis not present

## 2020-01-31 ENCOUNTER — Telehealth: Payer: Self-pay | Admitting: Family Medicine

## 2020-01-31 DIAGNOSIS — G500 Trigeminal neuralgia: Secondary | ICD-10-CM

## 2020-01-31 DIAGNOSIS — M5136 Other intervertebral disc degeneration, lumbar region: Secondary | ICD-10-CM

## 2020-01-31 DIAGNOSIS — M542 Cervicalgia: Secondary | ICD-10-CM

## 2020-01-31 DIAGNOSIS — G8929 Other chronic pain: Secondary | ICD-10-CM

## 2020-01-31 NOTE — Telephone Encounter (Signed)
[name] (patient's husband) called into the office requesting Hydrocodone refill for [name].  [facility]ill and [facility].    HYDROcodone-acetaminophen (NORCO/VICODIN) 5-325 MG tablet [[id number]]   Order Details Dose, Route, Frequency: As Directed  Dispense Quantity: 60 tablet Refills: 0       Sig: TAKE 1 TABLET EVERY TWELVE HOURS AS NEEDED      Start Date: 12/23/19 End Date: --  Written Date: 12/23/19 Expiration Date: 06/20/20  Earliest Fill Date: 12/23/19      Diagnosis Association: Degenerative disc disease, lumbar (M51.36); Encounter for chronic pain management (G89.29); NECK PAIN; NEURALGIA, TRIGEMINAL  Original Order:  HYDROcodone-acetaminophen (NORCO/VICODIN) 5-325 MG tablet [[id number]]  Providers  Authorizing Provider: Hali Marry, MD NPI: [id number]  [place] #: [id number]  Ordering User:  Hali Marry, MD      Pharmacy  [id number] - [place], [place] - [address]  [address], [place] [address]  Phone:  [phone] Fax:  [phone]  DEA #:  [id number]

## 2020-02-03 MED ORDER — HYDROCODONE-ACETAMINOPHEN 5-325 MG PO TABS: ORAL_TABLET | ORAL | 0 refills | Status: DC

## 2020-02-03 NOTE — Telephone Encounter (Signed)
Med sent.

## 2020-03-06 ENCOUNTER — Other Ambulatory Visit: Payer: Self-pay

## 2020-03-06 ENCOUNTER — Ambulatory Visit (INDEPENDENT_AMBULATORY_CARE_PROVIDER_SITE_OTHER): Payer: Medicare HMO | Admitting: Family Medicine

## 2020-03-06 ENCOUNTER — Encounter: Payer: Self-pay | Admitting: Family Medicine

## 2020-03-06 VITALS — BP 150/85 | HR 111 | Ht 66.93 in | Wt 138.0 lb

## 2020-03-06 DIAGNOSIS — G8929 Other chronic pain: Secondary | ICD-10-CM

## 2020-03-06 DIAGNOSIS — E039 Hypothyroidism, unspecified: Secondary | ICD-10-CM | POA: Diagnosis not present

## 2020-03-06 DIAGNOSIS — Z1211 Encounter for screening for malignant neoplasm of colon: Secondary | ICD-10-CM

## 2020-03-06 DIAGNOSIS — M5136 Other intervertebral disc degeneration, lumbar region: Secondary | ICD-10-CM

## 2020-03-06 DIAGNOSIS — Z1231 Encounter for screening mammogram for malignant neoplasm of breast: Secondary | ICD-10-CM

## 2020-03-06 DIAGNOSIS — R030 Elevated blood-pressure reading, without diagnosis of hypertension: Secondary | ICD-10-CM

## 2020-03-06 DIAGNOSIS — M51369 Other intervertebral disc degeneration, lumbar region without mention of lumbar back pain or lower extremity pain: Secondary | ICD-10-CM

## 2020-03-06 MED ORDER — TRIAMTERENE-HCTZ 37.5-25 MG PO TABS: 1.0000 | ORAL_TABLET | Freq: Every day | ORAL | 3 refills | Status: DC

## 2020-03-06 MED ORDER — LEVOTHYROXINE SODIUM 50 MCG PO TABS: 50.0000 ug | ORAL_TABLET | Freq: Every day | ORAL | 1 refills | Status: DC

## 2020-03-06 NOTE — Assessment & Plan Note (Signed)
Due for refills on medication.

## 2020-03-06 NOTE — Progress Notes (Signed)
Established Patient Office Visit  Subjective:  Patient ID: [name], female    DOB: 1945-11-11  Age: 74 y.o. MRN: [id number]  CC:  Chief Complaint  Patient presents with  . 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  On hydrocodone 5/325 twice daily.  Last seen in March.  She feels like she is doing well.  Takes an occasional Senokot as needed to help with her bowels.  She says she has chronic constipation at baseline.  She feels like the medication is effective in helping her pain.  She is sleeping well.  Past Medical History:  Diagnosis Date  . Breast cancer ([place]) 1999   T1N0 LEFT BREAST  . Cancer ([place]) 03-2006   METASTATIC BREAST TO APPARENT ISOLATED LEFT INTERNAL MAMMARY NODE  . Degenerative disc disease   . History of bladder infections    RECURRENT  . Hyperlipidemia   . Osteoporosis 03-04-2011    LAST BONE DENSITY 03-04-2011  . Recurrent sinusitis   . Thyroid disease   . Trigeminal neuralgia     Past Surgical History:  Procedure Laterality Date  . ABDOMINAL HYSTERECTOMY  2005   Complete  . BLADDER SUSPENSION  2005  . BREAST LUMPECTOMY Left 1999   malignant  . BREAST SURGERY  1999   Lumpectomy L Br  . BREAST SURGERY  2007   Lymph Node Removal L Chest Wall  . cancerous LN removed  2008  . left breast lumpectomy  1999    Family History  Problem Relation Age of Onset  . Cancer Mother        breast  . Stroke Mother   . Hyperlipidemia Mother   . Cancer Father        bladder  . Colon cancer Father   . Cancer Paternal Grandmother        Mouth  . Cancer Son        Breast  . Cancer Maternal Aunt        Breast  . Stomach cancer Paternal Uncle   . Stomach cancer Paternal Uncle     Social History   Socioeconomic History  . Marital status: Married    Spouse name: [name]   . Number of children: 3  . Years of education: Therapist, sports  . Highest education level: Not on  file  Occupational History    Comment: Retired- Therapist, sports  Tobacco Use  . Smoking status: Never Smoker  . Smokeless tobacco: Never Used  Vaping Use  . Vaping Use: Never used  Substance and Sexual Activity  . Alcohol use: No    Alcohol/week: 0.0 standard drinks  . Drug use: No  . Sexual activity: Not on file    Comment: housewife, married, 3 kids.  Other Topics Concern  . Not on file  Social History Narrative   Patient lives at home with her husband [name])   Retired - RN   Right handed.   Caffeine two cups coffee daily and one sweet tea.   Social Determinants of Health   Financial Resource Strain:   . Difficulty of Paying Living Expenses:   Food Insecurity:   . Worried About Charity fundraiser in the Last Year:   . Arboriculturist in the Last Year:   Transportation Needs:   . Film/video editor (Medical):   Marland Kitchen Lack of Transportation (Non-Medical):   Physical Activity:   .  Days of Exercise per Week:   . Minutes of Exercise per Session:   Stress:   . Feeling of Stress :   Social Connections:   . Frequency of Communication with Friends and Family:   . Frequency of Social Gatherings with Friends and Family:   . Attends Religious Services:   . Active Member of Clubs or Organizations:   . Attends Archivist Meetings:   Marland Kitchen Marital Status:   Intimate Partner Violence:   . Fear of Current or Ex-Partner:   . Emotionally Abused:   Marland Kitchen Physically Abused:   . Sexually Abused:     Outpatient Medications Prior to Visit  Medication Sig Dispense Refill  . azelastine (ASTELIN) 0.1 % nasal spray Place 2 sprays into both nostrils daily.    . Calcium-Magnesium-Vitamin D (CALCIUM 1200+D3 PO) Take by mouth daily.     . carbamazepine (TEGRETOL XR) 100 MG 12 hr tablet Take 100 mg by mouth 2 (two) times daily.    Marland Kitchen denosumab (PROLIA) 60 MG/ML SOLN injection Inject 60 mg into the skin every 6 (six) months. Administer in upper arm, thigh, or abdomen    . dorzolamide-timolol (COSOPT)  22.3-6.8 MG/ML ophthalmic solution 1 drop 2 (two) times daily.    Marland Kitchen EPINEPHrine (EPIPEN 2-PAK) 0.3 mg/0.3 mL IJ SOAJ injection Inject 0.3 mls (0.3 mg total) into the muscles AS NEEDED (allergic reaction) 2 Device prn  . fish oil-omega-3 fatty acids 1000 MG capsule Take 2 g by mouth daily.      . fluticasone (FLONASE) 50 MCG/ACT nasal spray Place 1 spray into both nostrils daily.  1  . HYDROcodone-acetaminophen (NORCO/VICODIN) 5-325 MG tablet TAKE 1 TABLET EVERY TWELVE HOURS AS NEEDED 60 tablet 0  . latanoprost (XALATAN) 0.005 % ophthalmic solution     . levocetirizine (XYZAL) 5 MG tablet Take 5 mg by mouth daily.      Marland Kitchen omeprazole (PRILOSEC) 40 MG capsule Take 1 capsule (40 mg total) by mouth daily. 90 capsule 3  . pravastatin (PRAVACHOL) 40 MG tablet TAKE 1 TABLET DAILY 90 tablet 1  . PREMARIN vaginal cream INERT 1 APPLICATORFUL VAGINALLY NIGHTLY 1 WEEK & THEN 3 NIGHTS A WEEK THEREAFTER    . senna-docusate (SENOKOT-S) 8.6-50 MG tablet Take 2 tablets by mouth at bedtime.    . tamoxifen (NOLVADEX) 20 MG tablet Take 1 tablet (20 mg total) by mouth daily. 90 tablet 3  . timolol (TIMOPTIC) 0.25 % ophthalmic solution 1 drop every morning.    . trimethoprim (TRIMPEX) 100 MG tablet Take 100 mg by mouth daily.    Marland Kitchen levothyroxine (SYNTHROID) 50 MCG tablet Take 1 tablet (50 mcg total) by mouth daily before breakfast. 90 tablet 0  . triamterene-hydrochlorothiazide (MAXZIDE-25) 37.5-25 MG tablet Take 1 tablet by mouth daily. 90 tablet 3   No facility-administered medications prior to visit.    Allergies  Allergen Reactions  . Simvastatin Other (See Comments)    Aches and memory loss  . Azithromycin Hives  . Doxycycline Itching  . Other Rash  . Trazodone And Nefazodone Other (See Comments)    Memory impairment and palpitations.   . Ampicillin Rash  . Cefadroxil Rash  . Dicloxacillin Rash    ROS Review of Systems    Objective:    Physical Exam Constitutional:      Appearance: She is  well-developed.  HENT:     Head: Normocephalic and atraumatic.  Cardiovascular:     Rate and Rhythm: Normal rate and regular rhythm.  Heart sounds: Normal heart sounds.  Pulmonary:     Effort: Pulmonary effort is normal.     Breath sounds: Normal breath sounds.  Skin:    General: Skin is warm and dry.  Neurological:     Mental Status: She is alert and oriented to person, place, and time.  Psychiatric:        Behavior: Behavior normal.     BP (!) 150/85   Pulse (!) 111   Ht 5' 6.93" (1.7 m)   Wt 138 lb (62.6 kg)   SpO2 96%   BMI 21.66 kg/m  Wt Readings from Last 3 Encounters:  03/06/20 138 lb (62.6 kg)  11/19/19 138 lb (62.6 kg)  11/13/19 135 lb (61.2 kg)     Health Maintenance Due  Topic Date Due  . Hepatitis C Screening  Never done  . DEXA SCAN  04/27/2019  . COLONOSCOPY  02/09/2020    There are no preventive care reminders to display for this patient.  Lab Results  Component Value Date   TSH 2.04 07/10/2019   Lab Results  Component Value Date   WBC 3.9 04/04/2017   HGB 12.3 04/04/2017   HCT 37.2 04/04/2017   MCV 93.9 04/04/2017   PLT 207 04/04/2017   Lab Results  Component Value Date   NA 143 12/04/2019   K 4.3 12/04/2019   CHLORIDE 103 01/31/2017   CO2 29 12/04/2019   GLUCOSE 94 12/04/2019   BUN 12 12/04/2019   CREATININE 0.81 12/04/2019   BILITOT 0.3 12/04/2019   ALKPHOS 29 (L) 04/04/2017   AST 19 12/04/2019   ALT 10 12/04/2019   PROT 6.3 12/04/2019   ALBUMIN 4.8 04/04/2017   CALCIUM 9.1 12/04/2019   ANIONGAP 9 01/31/2017   EGFR 54 (L) 01/31/2017   Lab Results  Component Value Date   CHOL 289 (H) 04/09/2019   Lab Results  Component Value Date   HDL 81 04/09/2019   Lab Results  Component Value Date   LDLCALC 169 (H) 04/09/2019   Lab Results  Component Value Date   TRIG 216 (H) 04/09/2019   Lab Results  Component Value Date   CHOLHDL 3.6 04/09/2019   No results found for: HGBA1C    Assessment & Plan:   Problem List  Items Addressed This Visit      Endocrine   Hypothyroidism    Due for refills on medication.      Relevant Medications   levothyroxine (SYNTHROID) 50 MCG tablet   Other Relevant Orders   TSH     Musculoskeletal and Integument   Degenerative disc disease, lumbar - Primary   Relevant Orders   DRUG MONITORING, PANEL 6 WITH CONFIRMATION, URINE     Other   Encounter for chronic pain management     Indication for chronic opioid: DDD Cervical and lumbar Medication and dose: Hydrocodone 5/325 # pills per month: 60  Last UDS date: 03/06/2020 Opioid Treatment Agreement signed (Y/N): Y Opioid Treatment Agreement last reviewed with patient: 11/29/2017  9:39 AM NCCSRS reviewed this encounter (include red flags):  Y        Relevant Orders   DRUG MONITORING, PANEL 6 WITH CONFIRMATION, URINE    Other Visit Diagnoses    Screen for colon cancer       Relevant Orders   Cologuard   Screening mammogram, encounter for       Relevant Orders   MM 3D SCREEN BREAST BILATERAL   Elevated BP without diagnosis of hypertension  Due for colon cancer screening.  Had colonoscopy 10 years ago.  She is interested in the Cologuard to go ahead and place order today and get that faxed if she has not heard back from them in about a week please give [place] call back.  Elevated BP - out of BP meds. RF sent.  Restart.   Meds ordered this encounter  Medications  . levothyroxine (SYNTHROID) 50 MCG tablet    Sig: Take 1 tablet (50 mcg total) by mouth daily before breakfast.    Dispense:  90 tablet    Refill:  1  . triamterene-hydrochlorothiazide (MAXZIDE-25) 37.5-25 MG tablet    Sig: Take 1 tablet by mouth daily.    Dispense:  90 tablet    Refill:  3    Follow-up: Return in about 3 months (around 06/06/2020) for chronic pain management.    Beatrice Lecher, MD

## 2020-03-06 NOTE — Assessment & Plan Note (Signed)
  Indication for chronic opioid: DDD Cervical and lumbar Medication and dose: Hydrocodone 5/325 # pills per month: 60  Last UDS date: 03/06/2020 Opioid Treatment Agreement signed (Y/N): Y Opioid Treatment Agreement last reviewed with patient: 11/29/2017  9:39 AM N[facility]SRS reviewed this encounter (include red flags):  [name]

## 2020-03-07 LAB — DRUG MONITORING, PANEL 6 WITH CONFIRMATION, URINE
6 Acetylmorphine: NEGATIVE ng/mL (ref <10)
Alcohol Metabolites: NEGATIVE ng/mL
Amphetamines: NEGATIVE ng/mL (ref <500)
Barbiturates: NEGATIVE ng/mL (ref <300)
Benzodiazepines: NEGATIVE ng/mL (ref <100)
Cocaine Metabolite: NEGATIVE ng/mL (ref <150)
Creatinine: 62 mg/dL
Marijuana Metabolite: NEGATIVE ng/mL (ref <20)
Methadone Metabolite: NEGATIVE ng/mL (ref <100)
Opiates: NEGATIVE ng/mL (ref <100)
Oxidant: NEGATIVE ug/mL
Oxycodone: NEGATIVE ng/mL (ref <100)
Phencyclidine: NEGATIVE ng/mL (ref <25)
pH: 6.3 (ref 4.5–9.0)

## 2020-03-07 LAB — DM TEMPLATE

## 2020-03-09 ENCOUNTER — Other Ambulatory Visit: Payer: Self-pay

## 2020-03-09 DIAGNOSIS — M5136 Other intervertebral disc degeneration, lumbar region: Secondary | ICD-10-CM

## 2020-03-09 DIAGNOSIS — G8929 Other chronic pain: Secondary | ICD-10-CM

## 2020-03-09 DIAGNOSIS — M542 Cervicalgia: Secondary | ICD-10-CM

## 2020-03-09 DIAGNOSIS — G500 Trigeminal neuralgia: Secondary | ICD-10-CM

## 2020-03-09 MED ORDER — HYDROCODONE-ACETAMINOPHEN 5-325 MG PO TABS: ORAL_TABLET | ORAL | 0 refills | Status: DC

## 2020-03-09 NOTE — Telephone Encounter (Signed)
Had routine medication follow up appt on Friday 03/06/20  Last RX sent 02/03/20  RX pended

## 2020-03-23 ENCOUNTER — Ambulatory Visit: Payer: Medicare HMO

## 2020-03-27 DIAGNOSIS — Z1211 Encounter for screening for malignant neoplasm of colon: Secondary | ICD-10-CM | POA: Diagnosis not present

## 2020-04-05 LAB — COLOGUARD: Cologuard: NEGATIVE

## 2020-04-06 ENCOUNTER — Telehealth: Payer: Self-pay | Admitting: Family Medicine

## 2020-04-06 ENCOUNTER — Encounter: Payer: Self-pay | Admitting: Family Medicine

## 2020-04-06 NOTE — Telephone Encounter (Signed)
Please call patient Cologuard was negative.  Repeat colon cancer screening in 3 years.

## 2020-04-06 NOTE — Telephone Encounter (Signed)
Attempted to contact patient regarding negative Cologuard results. No answer, phone keeps ringing. No option to leave a vm msg.

## 2020-04-07 NOTE — Telephone Encounter (Signed)
Left results w/pt's husband and he will relay these to her.

## 2020-04-09 ENCOUNTER — Other Ambulatory Visit: Payer: Self-pay

## 2020-04-09 ENCOUNTER — Ambulatory Visit
Admission: RE | Admit: 2020-04-09 | Discharge: 2020-04-09 | Disposition: A | Discharge status: Home / Self Care | Payer: Medicare HMO | Source: Ambulatory Visit | Attending: Family Medicine | Admitting: Family Medicine

## 2020-04-09 DIAGNOSIS — Z1231 Encounter for screening mammogram for malignant neoplasm of breast: Secondary | ICD-10-CM

## 2020-04-10 DIAGNOSIS — Z79810 Long term (current) use of selective estrogen receptor modulators (SERMs): Secondary | ICD-10-CM | POA: Diagnosis not present

## 2020-04-10 DIAGNOSIS — C50512 Malignant neoplasm of lower-outer quadrant of left female breast: Secondary | ICD-10-CM | POA: Diagnosis not present

## 2020-04-10 DIAGNOSIS — Z170 Estrogen receptor positive status [ER+]: Secondary | ICD-10-CM | POA: Diagnosis not present

## 2020-04-14 DIAGNOSIS — D485 Neoplasm of uncertain behavior of skin: Secondary | ICD-10-CM | POA: Diagnosis not present

## 2020-04-14 DIAGNOSIS — C44729 Squamous cell carcinoma of skin of left lower limb, including hip: Secondary | ICD-10-CM | POA: Diagnosis not present

## 2020-04-15 ENCOUNTER — Other Ambulatory Visit: Payer: Self-pay

## 2020-04-15 DIAGNOSIS — G500 Trigeminal neuralgia: Secondary | ICD-10-CM

## 2020-04-15 DIAGNOSIS — M542 Cervicalgia: Secondary | ICD-10-CM

## 2020-04-15 DIAGNOSIS — M5136 Other intervertebral disc degeneration, lumbar region: Secondary | ICD-10-CM

## 2020-04-15 DIAGNOSIS — G8929 Other chronic pain: Secondary | ICD-10-CM

## 2020-04-15 MED ORDER — HYDROCODONE-ACETAMINOPHEN 5-325 MG PO TABS: ORAL_TABLET | ORAL | 0 refills | Status: DC

## 2020-04-15 NOTE — Telephone Encounter (Signed)
Last RF 03/09/20  Last OV 03/06/20  RX pended

## 2020-04-16 NOTE — Telephone Encounter (Signed)
Patient advised.

## 2020-04-29 ENCOUNTER — Encounter: Payer: Self-pay | Admitting: Family Medicine

## 2020-04-29 DIAGNOSIS — C44729 Squamous cell carcinoma of skin of left lower limb, including hip: Secondary | ICD-10-CM | POA: Diagnosis not present

## 2020-05-14 DIAGNOSIS — J329 Chronic sinusitis, unspecified: Secondary | ICD-10-CM | POA: Diagnosis not present

## 2020-05-14 DIAGNOSIS — L298 Other pruritus: Secondary | ICD-10-CM | POA: Diagnosis not present

## 2020-05-14 DIAGNOSIS — J3089 Other allergic rhinitis: Secondary | ICD-10-CM | POA: Diagnosis not present

## 2020-05-14 DIAGNOSIS — R413 Other amnesia: Secondary | ICD-10-CM | POA: Diagnosis not present

## 2020-05-20 ENCOUNTER — Other Ambulatory Visit: Payer: Self-pay

## 2020-05-20 DIAGNOSIS — M5136 Other intervertebral disc degeneration, lumbar region: Secondary | ICD-10-CM

## 2020-05-20 DIAGNOSIS — G500 Trigeminal neuralgia: Secondary | ICD-10-CM

## 2020-05-20 DIAGNOSIS — M542 Cervicalgia: Secondary | ICD-10-CM

## 2020-05-20 DIAGNOSIS — G8929 Other chronic pain: Secondary | ICD-10-CM

## 2020-05-20 MED ORDER — HYDROCODONE-ACETAMINOPHEN 5-325 MG PO TABS: ORAL_TABLET | ORAL | 0 refills | Status: DC

## 2020-06-08 ENCOUNTER — Encounter: Payer: Self-pay | Admitting: Family Medicine

## 2020-06-08 ENCOUNTER — Ambulatory Visit (INDEPENDENT_AMBULATORY_CARE_PROVIDER_SITE_OTHER): Payer: Medicare HMO | Admitting: Family Medicine

## 2020-06-08 ENCOUNTER — Other Ambulatory Visit: Payer: Self-pay

## 2020-06-08 VITALS — BP 146/73 | HR 96 | Ht 67.0 in | Wt 133.0 lb

## 2020-06-08 DIAGNOSIS — E782 Mixed hyperlipidemia: Secondary | ICD-10-CM

## 2020-06-08 DIAGNOSIS — Z23 Encounter for immunization: Secondary | ICD-10-CM

## 2020-06-08 DIAGNOSIS — L570 Actinic keratosis: Secondary | ICD-10-CM | POA: Diagnosis not present

## 2020-06-08 DIAGNOSIS — G8929 Other chronic pain: Secondary | ICD-10-CM | POA: Diagnosis not present

## 2020-06-08 DIAGNOSIS — E039 Hypothyroidism, unspecified: Secondary | ICD-10-CM

## 2020-06-08 DIAGNOSIS — Z79899 Other long term (current) drug therapy: Secondary | ICD-10-CM | POA: Diagnosis not present

## 2020-06-08 DIAGNOSIS — M5136 Other intervertebral disc degeneration, lumbar region: Secondary | ICD-10-CM

## 2020-06-08 MED ORDER — PRAVASTATIN SODIUM 40 MG PO TABS: 40.0000 mg | ORAL_TABLET | Freq: Every day | ORAL | 3 refills | Status: DC

## 2020-06-08 NOTE — Assessment & Plan Note (Signed)
Doing well thus far on pravastatin.  Continue current regimen.  Due to recheck lipids and liver enzymes.

## 2020-06-08 NOTE — Progress Notes (Signed)
Established Patient Office Visit  Subjective:  Patient ID: [name], female    DOB: 03-12-1946  Age: 74 y.o. 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  On hydrocodone 5/325 twice daily.  Last seen in March.  She feels like she is doing well.  Takes an occasional Senokot as needed to help with her bowels.  She says she has chronic constipation at baseline.  She feels like the medication is effective in helping her pain.   Spot on her eye brow.  She thought that it could be lice and so has done 2 over-the-counter treatments but without relief.  It just along the medial eyebrow ridge just above the right eye.  Not painful  Hyperlipidemia - tolerating stating well with no myalgias or significant Norman effects.  Lab Results  Component Value Date   CHOL 289 (H) 04/09/2019   HDL 81 04/09/2019   LDLCALC 169 (H) 04/09/2019   TRIG 216 (H) 04/09/2019   CHOLHDL 3.6 04/09/2019   Hypothyroidism - Taking medication regularly in the AM away from food and vitamins, etc. No recent change to skin, hair, or energy levels.   Past Medical History:  Diagnosis Date   Breast cancer ([place]) 1999   T1N0 LEFT BREAST   Cancer ([place]) 03-2006   METASTATIC BREAST TO APPARENT ISOLATED LEFT INTERNAL MAMMARY NODE   Degenerative disc disease    History of bladder infections    RECURRENT   Hyperlipidemia    Osteoporosis 03-04-2011    LAST BONE DENSITY 03-04-2011   Recurrent sinusitis    Thyroid disease    Trigeminal neuralgia     Past Surgical History:  Procedure Laterality Date   ABDOMINAL HYSTERECTOMY  2005   Complete   BLADDER SUSPENSION  2005   BREAST LUMPECTOMY Left 1999   malignant   BREAST SURGERY  1999   Lumpectomy L Br   BREAST SURGERY  2007   Lymph Node Removal L Chest Wall   cancerous LN removed  2008    left breast lumpectomy  1999    Family History  Problem Relation Age of Onset   Cancer Mother        breast   Stroke Mother    Hyperlipidemia Mother    Cancer Father        bladder   Colon cancer Father    Cancer Paternal Grandmother        Mouth   Cancer Son        Breast   Cancer Maternal Aunt        Breast   Stomach cancer Paternal Uncle    Stomach cancer Paternal Uncle     Social History   Socioeconomic History   Marital status: Married    Spouse name: [name]    Number of children: 3   Years of education: RN   Highest education level: Not on file  Occupational History    Comment: Retired- Therapist, sports  Tobacco Use   Smoking status: Never Smoker   Smokeless tobacco: Never Used  Scientific laboratory technician Use: Never used  Substance and Sexual Activity   Alcohol use: No    Alcohol/week: 0.0 standard drinks   Drug use: No   Sexual activity: Not on file    Comment: housewife, married, 3 kids.  Other  Topics Concern   Not on file  Social History Narrative   Patient lives at home with her husband [name])   Retired - RN   Right handed.   Caffeine two cups coffee daily and one sweet tea.   Social Determinants of Health   Financial Resource Strain:    Difficulty of Paying Living Expenses: Not on file  Food Insecurity:    Worried About Charity fundraiser in the Last Year: Not on file   [employer] of Food in the Last Year: Not on file  Transportation Needs:    Lack of Transportation (Medical): Not on file   Lack of Transportation (Non-Medical): Not on file  Physical Activity:    Days of Exercise per Week: Not on file   Minutes of Exercise per Session: Not on file  Stress:    Feeling of Stress : Not on file  Social Connections:    Frequency of Communication with Friends and Family: Not on file   Frequency of Social Gatherings with Friends and Family: Not on file   Attends Religious Services: Not on file   Active Member of Clubs or  Organizations: Not on file   Attends Archivist Meetings: Not on file   Marital Status: Not on file  Intimate Partner Violence:    Fear of Current or Ex-Partner: Not on file   Emotionally Abused: Not on file   Physically Abused: Not on file   Sexually Abused: Not on file    Outpatient Medications Prior to Visit  Medication Sig Dispense Refill   azelastine (ASTELIN) 0.1 % nasal spray Place 2 sprays into both nostrils daily.     Calcium-Magnesium-Vitamin D (CALCIUM 1200+D3 PO) Take by mouth daily.      carbamazepine (TEGRETOL XR) 100 MG 12 hr tablet Take 100 mg by mouth 2 (two) times daily.     denosumab (PROLIA) 60 MG/ML SOLN injection Inject 60 mg into the skin every 6 (six) months. Administer in upper arm, thigh, or abdomen     dorzolamide-timolol (COSOPT) 22.3-6.8 MG/ML ophthalmic solution 1 drop 2 (two) times daily.     EPINEPHrine (EPIPEN 2-PAK) 0.3 mg/0.3 mL IJ SOAJ injection Inject 0.3 mls (0.3 mg total) into the muscles AS NEEDED (allergic reaction) 2 Device prn   fish oil-omega-3 fatty acids 1000 MG capsule Take 2 g by mouth daily.       fluticasone (FLONASE) 50 MCG/ACT nasal spray Place 1 spray into both nostrils daily.  1   HYDROcodone-acetaminophen (NORCO/VICODIN) 5-325 MG tablet TAKE 1 TABLET EVERY TWELVE HOURS AS NEEDED 60 tablet 0   latanoprost (XALATAN) 0.005 % ophthalmic solution      levocetirizine (XYZAL) 5 MG tablet Take 5 mg by mouth daily.       levothyroxine (SYNTHROID) 50 MCG tablet Take 1 tablet (50 mcg total) by mouth daily before breakfast. 90 tablet 1   omeprazole (PRILOSEC) 40 MG capsule Take 1 capsule (40 mg total) by mouth daily. 90 capsule 3   PREMARIN vaginal cream INERT 1 APPLICATORFUL VAGINALLY NIGHTLY 1 WEEK & THEN 3 NIGHTS A WEEK THEREAFTER     senna-docusate (SENOKOT-S) 8.6-50 MG tablet Take 2 tablets by mouth at bedtime.     tamoxifen (NOLVADEX) 20 MG tablet Take 1 tablet (20 mg total) by mouth daily. 90 tablet 3    timolol (TIMOPTIC) 0.25 % ophthalmic solution 1 drop every morning.     triamterene-hydrochlorothiazide (MAXZIDE-25) 37.5-25 MG tablet Take 1 tablet by mouth daily. 90 tablet 3  trimethoprim (TRIMPEX) 100 MG tablet Take 100 mg by mouth daily.     pravastatin (PRAVACHOL) 40 MG tablet TAKE 1 TABLET DAILY 90 tablet 1   No facility-administered medications prior to visit.    Allergies  Allergen Reactions   Simvastatin Other (See Comments)    Aches and memory loss   Azithromycin Hives   Doxycycline Itching   Other Rash   Trazodone And Nefazodone Other (See Comments)    Memory impairment and palpitations.    Ampicillin Rash   Cefadroxil Rash   Dicloxacillin Rash    ROS Review of Systems    Objective:    Physical Exam Constitutional:      Appearance: She is well-developed.  HENT:     Head: Normocephalic and atraumatic.  Cardiovascular:     Rate and Rhythm: Normal rate and regular rhythm.     Heart sounds: Normal heart sounds.  Pulmonary:     Effort: Pulmonary effort is normal.     Breath sounds: Normal breath sounds.  Skin:    General: Skin is warm and dry.     Comments: Skin lesion on her forehead along the medial edge of the eyebrow over the right eye with a very fine hyperkeratotic lesion most consistent with an actinic keratoses.  Just medial to that she has a larger seborrheic keratosis that is approximately 1 cm in size.  Neurological:     Mental Status: She is alert and oriented to person, place, and time.  Psychiatric:        Behavior: Behavior normal.     BP (!) 146/73    Pulse 96    Ht _0  (1.702 m)    Wt 133 lb (60.3 kg)    SpO2 97%    BMI 20.83 kg/m  Wt Readings from Last 3 Encounters:  06/08/20 133 lb (60.3 kg)  03/06/20 138 lb (62.6 kg)  11/19/19 138 lb (62.6 kg)     Health Maintenance Due  Topic Date Due   Hepatitis C Screening  Never done   DEXA SCAN  04/27/2019    There are no preventive care reminders to display for this  patient.  Lab Results  Component Value Date   TSH 2.04 07/10/2019   Lab Results  Component Value Date   WBC 3.9 04/04/2017   HGB 12.3 04/04/2017   HCT 37.2 04/04/2017   MCV 93.9 04/04/2017   PLT 207 04/04/2017   Lab Results  Component Value Date   NA 143 12/04/2019   K 4.3 12/04/2019   CHLORIDE 103 01/31/2017   CO2 29 12/04/2019   GLUCOSE 94 12/04/2019   BUN 12 12/04/2019   CREATININE 0.81 12/04/2019   BILITOT 0.3 12/04/2019   ALKPHOS 29 (L) 04/04/2017   AST 19 12/04/2019   ALT 10 12/04/2019   PROT 6.3 12/04/2019   ALBUMIN 4.8 04/04/2017   CALCIUM 9.1 12/04/2019   ANIONGAP 9 01/31/2017   EGFR 54 (L) 01/31/2017   Lab Results  Component Value Date   CHOL 289 (H) 04/09/2019   Lab Results  Component Value Date   HDL 81 04/09/2019   Lab Results  Component Value Date   LDLCALC 169 (H) 04/09/2019   Lab Results  Component Value Date   TRIG 216 (H) 04/09/2019   Lab Results  Component Value Date   CHOLHDL 3.6 04/09/2019   No results found for: HGBA1C    Assessment & Plan:   Problem List Items Addressed This Visit      Endocrine  Hypothyroidism    Due to recheck TSH.  Currently asymptomatic.      Relevant Orders   TSH   COMPLETE METABOLIC PANEL WITH GFR     Musculoskeletal and Integument   Degenerative disc disease, lumbar     Other   HYPERLIPIDEMIA, MIXED    Doing well thus far on pravastatin.  Continue current regimen.  Due to recheck lipids and liver enzymes.      Relevant Medications   pravastatin (PRAVACHOL) 40 MG tablet   Other Relevant Orders   Lipid panel   COMPLETE METABOLIC PANEL WITH GFR   High risk medication use   Encounter for chronic pain management    Indication for chronic opioid: DDD Cervical and lumbar Medication and dose: Hydrocodone 5/325 # pills per month: 60  Last UDS date: 03/06/2020 Opioid Treatment Agreement signed (Y/N): Y Opioid Treatment Agreement last reviewed with patient: 06/08/2020 N[facility] reviewed this  encounter (include red flags):  Y  F/U in 3 months.       Relevant Orders   COMPLETE METABOLIC PANEL WITH GFR    Other Visit Diagnoses    Need for immunization against influenza    -  Primary   Relevant Orders   Flu Vaccine QUAD High Dose(Fluad) (Completed)   Actinic keratosis          Actinic keratosis-lesion over the left eyebrow is most consistent with an AK.  Explained diagnosis and cause for this lesion being sun exposure.  Discussed that potential treatment would include cryotherapy.  She actually felt quite relieved to know that it was not lice.  Meds ordered this encounter  Medications   pravastatin (PRAVACHOL) 40 MG tablet    Sig: Take 1 tablet (40 mg total) by mouth daily.    Dispense:  90 tablet    Refill:  3      Follow-up: Return in about 3 months (around 09/08/2020) for Chronic Pain management. Beatrice Lecher, MD

## 2020-06-08 NOTE — Assessment & Plan Note (Signed)
Due to recheck TSH.  Currently asymptomatic.

## 2020-06-08 NOTE — Patient Instructions (Signed)
The lesion on the forehead is most consistent with an actinic keratosis.  If it gets a little larger or crusty then we can do cryotherapy and freeze it here in the office.

## 2020-06-08 NOTE — Assessment & Plan Note (Signed)
Indication for chronic opioid: DDD Cervical and lumbar Medication and dose: Hydrocodone 5/325 # pills per month: 60  Last UDS date: 03/06/2020 Opioid Treatment Agreement signed (Y/N): Y Opioid Treatment Agreement last reviewed with patient: 06/08/2020 N[facility] reviewed this encounter (include red flags):  Y  F/U in 3 months.

## 2020-06-18 ENCOUNTER — Other Ambulatory Visit: Payer: Self-pay | Admitting: Family Medicine

## 2020-06-18 DIAGNOSIS — E782 Mixed hyperlipidemia: Secondary | ICD-10-CM

## 2020-06-22 ENCOUNTER — Other Ambulatory Visit: Payer: Self-pay

## 2020-06-22 DIAGNOSIS — G8929 Other chronic pain: Secondary | ICD-10-CM

## 2020-06-22 DIAGNOSIS — M542 Cervicalgia: Secondary | ICD-10-CM

## 2020-06-22 DIAGNOSIS — G500 Trigeminal neuralgia: Secondary | ICD-10-CM

## 2020-06-22 DIAGNOSIS — M5136 Other intervertebral disc degeneration, lumbar region: Secondary | ICD-10-CM

## 2020-06-22 MED ORDER — HYDROCODONE-ACETAMINOPHEN 5-325 MG PO TABS: ORAL_TABLET | ORAL | 0 refills | Status: DC

## 2020-06-23 DIAGNOSIS — G3184 Mild cognitive impairment, so stated: Secondary | ICD-10-CM | POA: Diagnosis not present

## 2020-06-23 DIAGNOSIS — M810 Age-related osteoporosis without current pathological fracture: Secondary | ICD-10-CM | POA: Diagnosis not present

## 2020-06-23 DIAGNOSIS — M1990 Unspecified osteoarthritis, unspecified site: Secondary | ICD-10-CM | POA: Diagnosis not present

## 2020-06-23 DIAGNOSIS — E785 Hyperlipidemia, unspecified: Secondary | ICD-10-CM | POA: Diagnosis not present

## 2020-06-23 DIAGNOSIS — G8929 Other chronic pain: Secondary | ICD-10-CM | POA: Diagnosis not present

## 2020-06-23 DIAGNOSIS — C779 Secondary and unspecified malignant neoplasm of lymph node, unspecified: Secondary | ICD-10-CM | POA: Diagnosis not present

## 2020-06-23 DIAGNOSIS — E039 Hypothyroidism, unspecified: Secondary | ICD-10-CM | POA: Diagnosis not present

## 2020-06-23 DIAGNOSIS — K219 Gastro-esophageal reflux disease without esophagitis: Secondary | ICD-10-CM | POA: Diagnosis not present

## 2020-06-23 DIAGNOSIS — C50919 Malignant neoplasm of unspecified site of unspecified female breast: Secondary | ICD-10-CM | POA: Diagnosis not present

## 2020-06-23 DIAGNOSIS — I951 Orthostatic hypotension: Secondary | ICD-10-CM | POA: Diagnosis not present

## 2020-06-24 DIAGNOSIS — N3020 Other chronic cystitis without hematuria: Secondary | ICD-10-CM | POA: Diagnosis not present

## 2020-08-06 ENCOUNTER — Other Ambulatory Visit: Payer: Self-pay

## 2020-08-06 DIAGNOSIS — M542 Cervicalgia: Secondary | ICD-10-CM

## 2020-08-06 DIAGNOSIS — G500 Trigeminal neuralgia: Secondary | ICD-10-CM

## 2020-08-06 DIAGNOSIS — M5136 Other intervertebral disc degeneration, lumbar region: Secondary | ICD-10-CM

## 2020-08-06 DIAGNOSIS — M51369 Other intervertebral disc degeneration, lumbar region without mention of lumbar back pain or lower extremity pain: Secondary | ICD-10-CM

## 2020-08-06 DIAGNOSIS — G8929 Other chronic pain: Secondary | ICD-10-CM

## 2020-08-06 NOTE — Telephone Encounter (Signed)
Patient's husband requesting Hydrocodone.

## 2020-08-07 MED ORDER — HYDROCODONE-ACETAMINOPHEN 5-325 MG PO TABS: ORAL_TABLET | ORAL | 0 refills | Status: DC

## 2020-08-12 DIAGNOSIS — H401131 Primary open-angle glaucoma, bilateral, mild stage: Secondary | ICD-10-CM | POA: Diagnosis not present

## 2020-08-12 DIAGNOSIS — Z0100 Encounter for examination of eyes and vision without abnormal findings: Secondary | ICD-10-CM | POA: Diagnosis not present

## 2020-08-12 DIAGNOSIS — H524 Presbyopia: Secondary | ICD-10-CM | POA: Diagnosis not present

## 2020-09-08 ENCOUNTER — Ambulatory Visit: Payer: Medicare HMO | Admitting: Family Medicine

## 2020-09-10 ENCOUNTER — Ambulatory Visit (INDEPENDENT_AMBULATORY_CARE_PROVIDER_SITE_OTHER): Payer: Medicare HMO | Admitting: Family Medicine

## 2020-09-10 ENCOUNTER — Encounter: Payer: Self-pay | Admitting: Family Medicine

## 2020-09-10 ENCOUNTER — Other Ambulatory Visit: Payer: Self-pay

## 2020-09-10 VITALS — BP 139/78 | HR 81 | Wt 131.0 lb

## 2020-09-10 DIAGNOSIS — M542 Cervicalgia: Secondary | ICD-10-CM | POA: Diagnosis not present

## 2020-09-10 DIAGNOSIS — E559 Vitamin D deficiency, unspecified: Secondary | ICD-10-CM

## 2020-09-10 DIAGNOSIS — M5136 Other intervertebral disc degeneration, lumbar region: Secondary | ICD-10-CM

## 2020-09-10 DIAGNOSIS — M810 Age-related osteoporosis without current pathological fracture: Secondary | ICD-10-CM

## 2020-09-10 DIAGNOSIS — G8929 Other chronic pain: Secondary | ICD-10-CM

## 2020-09-10 DIAGNOSIS — G500 Trigeminal neuralgia: Secondary | ICD-10-CM

## 2020-09-10 DIAGNOSIS — Z79899 Other long term (current) drug therapy: Secondary | ICD-10-CM

## 2020-09-10 DIAGNOSIS — E039 Hypothyroidism, unspecified: Secondary | ICD-10-CM

## 2020-09-10 DIAGNOSIS — E782 Mixed hyperlipidemia: Secondary | ICD-10-CM

## 2020-09-10 MED ORDER — HYDROCODONE-ACETAMINOPHEN 5-325 MG PO TABS
ORAL_TABLET | ORAL | 0 refills | Status: DC
Start: 2020-09-10 — End: 2020-10-13

## 2020-09-10 NOTE — Assessment & Plan Note (Signed)
Prefers not to be on a bisphosphonate currently but I do want to make sure that her vitamin D levels are adequate.  They were low the last time we checked them.  Continue with daily calcium and vitamin D.

## 2020-09-10 NOTE — Assessment & Plan Note (Signed)
Rating statin well.  Due to recheck lipids.

## 2020-09-10 NOTE — Assessment & Plan Note (Signed)
Indication for chronic opioid: DDD Cervical and lumbar Medication and dose: Hydrocodone 5/325 # pills per month: 60    Last UDS date: 09/11/2019 Opioid Treatment Agreement signed (Y/N): Y Opioid Treatment Agreement last reviewed with patient: 06/08/2020 N[facility] reviewed this encounter (include red flags):  Y  F/U in 3 months.

## 2020-09-10 NOTE — Progress Notes (Signed)
Established Patient Office Visit  Subjective:  Patient ID: [name], female    DOB: 1946-02-03  Age: 75 y.o. MRN: [id number]  CC:  Chief Complaint  Patient presents with  . chronic pain management    HPI   [name] presents for   Follow-up chronic pain management-she is currently on hydrocodone for her chronic low back pain.  She is doing well overall she says the pain medication is effective in keeping her going.  She does walk about 3 days/week with her husband and that does help.  She has not had any recent flares or exacerbations.  Hypothyroidism - Taking medication regularly in the AM away from food and vitamins, etc. No recent change to skin, hair, or energy levels.   Hyperlipidemia - tolerating stating well with no myalgias or significant side effects.  Lab Results  Component Value Date   CHOL 289 (H) 04/09/2019   HDL 81 04/09/2019   LDLCALC 169 (H) 04/09/2019   TRIG 216 (H) 04/09/2019   CHOLHDL 3.6 04/09/2019      Past Medical History:  Diagnosis Date  . Breast cancer ([place]) 1999   T1N0 LEFT BREAST  . Cancer ([place]) 03-2006   METASTATIC BREAST TO APPARENT ISOLATED LEFT INTERNAL MAMMARY NODE  . Degenerative disc disease   . History of bladder infections    RECURRENT  . Hyperlipidemia   . Osteoporosis 03-04-2011    LAST BONE DENSITY 03-04-2011  . Recurrent sinusitis   . Thyroid disease   . Trigeminal neuralgia     Past Surgical History:  Procedure Laterality Date  . ABDOMINAL HYSTERECTOMY  2005   Complete  . BLADDER SUSPENSION  2005  . BREAST LUMPECTOMY Left 1999   malignant  . BREAST SURGERY  1999   Lumpectomy L Br  . BREAST SURGERY  2007   Lymph Node Removal L Chest Wall  . cancerous LN removed  2008  . left breast lumpectomy  1999    Family History  Problem Relation Age of Onset  . Cancer Mother        breast  . Stroke Mother   . Hyperlipidemia Mother   . Cancer Father        bladder  . Colon cancer Father   . Cancer Paternal  Grandmother        Mouth  . Cancer Son        Breast  . Cancer Maternal Aunt        Breast  . Stomach cancer Paternal Uncle   . Stomach cancer Paternal Uncle     Social History   Socioeconomic History  . Marital status: Married    Spouse name: [name]   . Number of children: 3  . Years of education: Therapist, sports  . Highest education level: Not on file  Occupational History    Comment: Retired- Therapist, sports  Tobacco Use  . Smoking status: Never Smoker  . Smokeless tobacco: Never Used  Vaping Use  . Vaping Use: Never used  Substance and Sexual Activity  . Alcohol use: No    Alcohol/week: 0.0 standard drinks  . Drug use: No  . Sexual activity: Not on file    Comment: housewife, married, 3 kids.  Other Topics Concern  . Not on file  Social History Narrative   Patient lives at home with her husband [name])   Retired - RN   Right handed.   Caffeine two cups coffee daily and one sweet tea.   Social Determinants of  Health   Financial Resource Strain: Not on file  Food Insecurity: Not on file  Transportation Needs: Not on file  Physical Activity: Not on file  Stress: Not on file  Social Connections: Not on file  Intimate Partner Violence: Not on file    Outpatient Medications Prior to Visit  Medication Sig Dispense Refill  . azelastine (ASTELIN) 0.1 % nasal spray Place 2 sprays into both nostrils daily.    . Calcium-Magnesium-Vitamin D (CALCIUM 1200+D3 PO) Take by mouth daily.     . carbamazepine (TEGRETOL XR) 100 MG 12 hr tablet Take 100 mg by mouth 2 (two) times daily.    Marland Kitchen denosumab (PROLIA) 60 MG/ML SOLN injection Inject 60 mg into the skin every 6 (six) months. Administer in upper arm, thigh, or abdomen    . dorzolamide-timolol (COSOPT) 22.3-6.8 MG/ML ophthalmic solution 1 drop 2 (two) times daily.    Marland Kitchen EPINEPHrine (EPIPEN 2-PAK) 0.3 mg/0.3 mL IJ SOAJ injection Inject 0.3 mls (0.3 mg total) into the muscles AS NEEDED (allergic reaction) 2 Device prn  . fish oil-omega-3 fatty acids  1000 MG capsule Take 2 g by mouth daily.    . fluticasone (FLONASE) 50 MCG/ACT nasal spray Place 1 spray into both nostrils daily.  1  . latanoprost (XALATAN) 0.005 % ophthalmic solution     . levocetirizine (XYZAL) 5 MG tablet Take 5 mg by mouth daily.    Marland Kitchen levothyroxine (SYNTHROID) 50 MCG tablet Take 1 tablet (50 mcg total) by mouth daily before breakfast. 90 tablet 1  . omeprazole (PRILOSEC) 40 MG capsule Take 1 capsule (40 mg total) by mouth daily. 90 capsule 3  . pravastatin (PRAVACHOL) 40 MG tablet TAKE 1 TABLET DAILY 90 tablet 3  . PREMARIN vaginal cream INERT 1 APPLICATORFUL VAGINALLY NIGHTLY 1 WEEK & THEN 3 NIGHTS A WEEK THEREAFTER    . senna-docusate (SENOKOT-S) 8.6-50 MG tablet Take 2 tablets by mouth at bedtime.    . tamoxifen (NOLVADEX) 20 MG tablet Take 1 tablet (20 mg total) by mouth daily. 90 tablet 3  . timolol (TIMOPTIC) 0.25 % ophthalmic solution 1 drop every morning.    . triamterene-hydrochlorothiazide (MAXZIDE-25) 37.5-25 MG tablet Take 1 tablet by mouth daily. 90 tablet 3  . trimethoprim (TRIMPEX) 100 MG tablet Take 100 mg by mouth daily.    Marland Kitchen HYDROcodone-acetaminophen (NORCO/VICODIN) 5-325 MG tablet TAKE 1 TABLET EVERY TWELVE HOURS AS NEEDED 60 tablet 0   No facility-administered medications prior to visit.    Allergies  Allergen Reactions  . Simvastatin Other (See Comments)    Aches and memory loss  . Azithromycin Hives  . Doxycycline Itching  . Other Rash  . Trazodone And Nefazodone Other (See Comments)    Memory impairment and palpitations.   . Ampicillin Rash  . Cefadroxil Rash  . Dicloxacillin Rash    ROS Review of Systems    Objective:    Physical Exam Constitutional:      Appearance: She is well-developed and well-nourished.  HENT:     Head: Normocephalic and atraumatic.  Cardiovascular:     Rate and Rhythm: Normal rate and regular rhythm.     Heart sounds: Normal heart sounds.  Pulmonary:     Effort: Pulmonary effort is normal.      Breath sounds: Normal breath sounds.  Skin:    General: Skin is warm and dry.  Neurological:     Mental Status: She is alert and oriented to person, place, and time.  Psychiatric:  Mood and Affect: Mood and affect normal.        Behavior: Behavior normal.     BP 139/78   Pulse 81   Wt 131 lb (59.4 kg)   SpO2 99%   BMI 20.52 kg/m  Wt Readings from Last 3 Encounters:  09/10/20 131 lb (59.4 kg)  06/08/20 133 lb (60.3 kg)  03/06/20 138 lb (62.6 kg)     Health Maintenance Due  Topic Date Due  . Hepatitis C Screening  Never done  . DEXA SCAN  04/27/2019    There are no preventive care reminders to display for this patient.  Lab Results  Component Value Date   TSH 2.04 07/10/2019   Lab Results  Component Value Date   WBC 3.9 04/04/2017   HGB 12.3 04/04/2017   HCT 37.2 04/04/2017   MCV 93.9 04/04/2017   PLT 207 04/04/2017   Lab Results  Component Value Date   NA 143 12/04/2019   K 4.3 12/04/2019   CHLORIDE 103 01/31/2017   CO2 29 12/04/2019   GLUCOSE 94 12/04/2019   BUN 12 12/04/2019   CREATININE 0.81 12/04/2019   BILITOT 0.3 12/04/2019   ALKPHOS 29 (L) 04/04/2017   AST 19 12/04/2019   ALT 10 12/04/2019   PROT 6.3 12/04/2019   ALBUMIN 4.8 04/04/2017   CALCIUM 9.1 12/04/2019   ANIONGAP 9 01/31/2017   EGFR 54 (L) 01/31/2017   Lab Results  Component Value Date   CHOL 289 (H) 04/09/2019   Lab Results  Component Value Date   HDL 81 04/09/2019   Lab Results  Component Value Date   LDLCALC 169 (H) 04/09/2019   Lab Results  Component Value Date   TRIG 216 (H) 04/09/2019   Lab Results  Component Value Date   CHOLHDL 3.6 04/09/2019   No results found for: HGBA1C    Assessment & Plan:   Problem List Items Addressed This Visit      Endocrine   Hypothyroidism    Table on current regimen.  Due to recheck levels make any adjustments needed.  Its actually been over a year.      Relevant Orders   TSH   COMPLETE METABOLIC PANEL WITH GFR    Lipid panel     Nervous and Auditory   NEURALGIA, TRIGEMINAL   Relevant Medications   HYDROcodone-acetaminophen (NORCO/VICODIN) 5-325 MG tablet     Musculoskeletal and Integument   Osteoporosis    Prefers not to be on a bisphosphonate currently but I do want to make sure that her vitamin D levels are adequate.  They were low the last time we checked them.  Continue with daily calcium and vitamin D.      Relevant Orders   VITAMIN D 25 Hydroxy (Vit-D Deficiency, Fractures)   Degenerative disc disease, lumbar   Relevant Medications   HYDROcodone-acetaminophen (NORCO/VICODIN) 5-325 MG tablet     Other   NECK PAIN   Relevant Medications   HYDROcodone-acetaminophen (NORCO/VICODIN) 5-325 MG tablet   HYPERLIPIDEMIA, MIXED    Rating statin well.  Due to recheck lipids.      Relevant Orders   TSH   COMPLETE METABOLIC PANEL WITH GFR   Lipid panel   High risk medication use - Primary   Relevant Orders   DRUG MONITORING, PANEL 6 WITH CONFIRMATION, URINE   TSH   COMPLETE METABOLIC PANEL WITH GFR   Lipid panel   Encounter for chronic pain management    Indication for chronic opioid: DDD Cervical  and lumbar Medication and dose: Hydrocodone 5/325 # pills per month: 60    Last UDS date: 09/11/2019 Opioid Treatment Agreement signed (Y/N): Y Opioid Treatment Agreement last reviewed with patient: 06/08/2020 [facility] reviewed this encounter (include red flags):  Y  F/U in 3 months.       Relevant Medications   HYDROcodone-acetaminophen (NORCO/VICODIN) 5-325 MG tablet    Other Visit Diagnoses    Vitamin D deficiency       Relevant Orders   VITAMIN D 25 Hydroxy (Vit-D Deficiency, Fractures)      Meds ordered this encounter  Medications  . HYDROcodone-acetaminophen (NORCO/VICODIN) 5-325 MG tablet    Sig: TAKE 1 TABLET EVERY TWELVE HOURS AS NEEDED    Dispense:  60 tablet    Refill:  0    Follow-up: Return in about 3 months (around 12/09/2020) for chronic pain management .     Beatrice Lecher, MD

## 2020-09-10 NOTE — Assessment & Plan Note (Signed)
Table on current regimen.  Due to recheck levels make any adjustments needed.  Its actually been over a year.

## 2020-09-11 LAB — COMPLETE METABOLIC PANEL WITH GFR
AG Ratio: 2.4 (calc) (ref 1.0–2.5)
ALT: 12 U/L (ref 6–29)
AST: 22 U/L (ref 10–35)
Albumin: 4.8 g/dL (ref 3.6–5.1)
Alkaline phosphatase (APISO): 51 U/L (ref 37–153)
BUN/Creatinine Ratio: 18 (calc) (ref 6–22)
BUN: 19 mg/dL (ref 7–25)
CO2: 30 mmol/L (ref 20–32)
Calcium: 9.8 mg/dL (ref 8.6–10.4)
Chloride: 101 mmol/L (ref 98–110)
Creat: 1.06 mg/dL — ABNORMAL HIGH (ref 0.60–0.93)
GFR, Est African American: 60 mL/min/{1.73_m2} (ref >=60)
GFR, Est Non African American: 52 mL/min/{1.73_m2} — ABNORMAL LOW (ref >=60)
Globulin: 2 g/dL (calc) (ref 1.9–3.7)
Glucose, Bld: 119 mg/dL (ref 65–139)
Potassium: 4.2 mmol/L (ref 3.5–5.3)
Sodium: 139 mmol/L (ref 135–146)
Total Bilirubin: 0.4 mg/dL (ref 0.2–1.2)
Total Protein: 6.8 g/dL (ref 6.1–8.1)

## 2020-09-11 LAB — DRUG MONITORING, PANEL 6 WITH CONFIRMATION, URINE
6 Acetylmorphine: NEGATIVE ng/mL (ref <10)
6 Acetylmorphine: NEGATIVE ng/mL (ref <10)
Alcohol Metabolites: NEGATIVE ng/mL
Alcohol Metabolites: NEGATIVE ng/mL
Amphetamines: NEGATIVE ng/mL (ref <500)
Amphetamines: NEGATIVE ng/mL (ref <500)
Barbiturates: NEGATIVE ng/mL (ref <300)
Barbiturates: NEGATIVE ng/mL (ref <300)
Benzodiazepines: NEGATIVE ng/mL (ref <100)
Benzodiazepines: NEGATIVE ng/mL (ref <100)
Cocaine Metabolite: NEGATIVE ng/mL (ref <150)
Cocaine Metabolite: NEGATIVE ng/mL (ref <150)
Creatinine: 51 mg/dL
Creatinine: 39.8 mg/dL
Marijuana Metabolite: NEGATIVE ng/mL (ref <20)
Marijuana Metabolite: NEGATIVE ng/mL (ref <20)
Methadone Metabolite: NEGATIVE ng/mL (ref <100)
Methadone Metabolite: NEGATIVE ng/mL (ref <100)
Opiates: NEGATIVE ng/mL (ref <100)
Opiates: NEGATIVE ng/mL (ref <100)
Oxidant: NEGATIVE ug/mL
Oxidant: NEGATIVE ug/mL
Oxycodone: NEGATIVE ng/mL (ref <100)
Oxycodone: NEGATIVE ng/mL (ref <100)
Phencyclidine: NEGATIVE ng/mL (ref <25)
Phencyclidine: NEGATIVE ng/mL (ref <25)
pH: 7 (ref 4.5–9.0)
pH: 7.4 (ref 4.5–9.0)

## 2020-09-11 LAB — LIPID PANEL
Cholesterol: 243 mg/dL — ABNORMAL HIGH (ref <200)
HDL: 71 mg/dL (ref >=50)
LDL Cholesterol (Calc): 130 mg/dL (calc) — ABNORMAL HIGH
Non-HDL Cholesterol (Calc): 172 mg/dL (calc) — ABNORMAL HIGH (ref <130)
Total CHOL/HDL Ratio: 3.4 (calc) (ref <5.0)
Triglycerides: 276 mg/dL — ABNORMAL HIGH (ref <150)

## 2020-09-11 LAB — DM TEMPLATE

## 2020-09-11 LAB — TSH: TSH: 3.22 mIU/L (ref 0.40–4.50)

## 2020-09-11 LAB — VITAMIN D 25 HYDROXY (VIT D DEFICIENCY, FRACTURES): Vit D, 25-Hydroxy: 31 ng/mL (ref 30–100)

## 2020-09-21 ENCOUNTER — Other Ambulatory Visit: Payer: Self-pay | Admitting: Family Medicine

## 2020-10-13 ENCOUNTER — Other Ambulatory Visit: Payer: Self-pay | Admitting: Family Medicine

## 2020-10-13 DIAGNOSIS — M542 Cervicalgia: Secondary | ICD-10-CM

## 2020-10-13 DIAGNOSIS — G8929 Other chronic pain: Secondary | ICD-10-CM

## 2020-10-13 DIAGNOSIS — M5136 Other intervertebral disc degeneration, lumbar region: Secondary | ICD-10-CM

## 2020-10-13 DIAGNOSIS — G500 Trigeminal neuralgia: Secondary | ICD-10-CM

## 2020-10-13 MED ORDER — HYDROCODONE-ACETAMINOPHEN 5-325 MG PO TABS
ORAL_TABLET | ORAL | 0 refills | Status: DC
Start: 2020-10-13 — End: 2020-11-16

## 2020-10-13 NOTE — Telephone Encounter (Signed)
Patient needs a refill on her HYDROcodone-acetaminophen (NORCO/VICODIN) 5-325 MG tablet  [facility] [place]

## 2020-10-22 DIAGNOSIS — C771 Secondary and unspecified malignant neoplasm of intrathoracic lymph nodes: Secondary | ICD-10-CM | POA: Diagnosis not present

## 2020-10-22 DIAGNOSIS — T451X5A Adverse effect of antineoplastic and immunosuppressive drugs, initial encounter: Secondary | ICD-10-CM | POA: Diagnosis not present

## 2020-10-22 DIAGNOSIS — N952 Postmenopausal atrophic vaginitis: Secondary | ICD-10-CM | POA: Diagnosis not present

## 2020-10-22 DIAGNOSIS — Z170 Estrogen receptor positive status [ER+]: Secondary | ICD-10-CM | POA: Diagnosis not present

## 2020-10-22 DIAGNOSIS — Z90710 Acquired absence of both cervix and uterus: Secondary | ICD-10-CM | POA: Diagnosis not present

## 2020-10-22 DIAGNOSIS — T386X5A Adverse effect of antigonadotrophins, antiestrogens, antiandrogens, not elsewhere classified, initial encounter: Secondary | ICD-10-CM | POA: Diagnosis not present

## 2020-10-22 DIAGNOSIS — Z842 Family history of other diseases of the genitourinary system: Secondary | ICD-10-CM | POA: Diagnosis not present

## 2020-10-22 DIAGNOSIS — M818 Other osteoporosis without current pathological fracture: Secondary | ICD-10-CM | POA: Diagnosis not present

## 2020-10-22 DIAGNOSIS — Z923 Personal history of irradiation: Secondary | ICD-10-CM | POA: Diagnosis not present

## 2020-10-22 DIAGNOSIS — C50512 Malignant neoplasm of lower-outer quadrant of left female breast: Secondary | ICD-10-CM | POA: Diagnosis not present

## 2020-10-22 DIAGNOSIS — Z5181 Encounter for therapeutic drug level monitoring: Secondary | ICD-10-CM | POA: Diagnosis not present

## 2020-11-05 ENCOUNTER — Other Ambulatory Visit: Payer: Self-pay | Admitting: Family Medicine

## 2020-11-05 DIAGNOSIS — E039 Hypothyroidism, unspecified: Secondary | ICD-10-CM

## 2020-11-16 ENCOUNTER — Other Ambulatory Visit: Payer: Self-pay

## 2020-11-16 DIAGNOSIS — G500 Trigeminal neuralgia: Secondary | ICD-10-CM

## 2020-11-16 DIAGNOSIS — M5136 Other intervertebral disc degeneration, lumbar region: Secondary | ICD-10-CM

## 2020-11-16 DIAGNOSIS — G8929 Other chronic pain: Secondary | ICD-10-CM

## 2020-11-16 DIAGNOSIS — M542 Cervicalgia: Secondary | ICD-10-CM

## 2020-11-16 MED ORDER — HYDROCODONE-ACETAMINOPHEN 5-325 MG PO TABS: ORAL_TABLET | ORAL | 0 refills | Status: DC

## 2020-11-19 DIAGNOSIS — L905 Scar conditions and fibrosis of skin: Secondary | ICD-10-CM | POA: Diagnosis not present

## 2020-11-19 DIAGNOSIS — Z85828 Personal history of other malignant neoplasm of skin: Secondary | ICD-10-CM | POA: Diagnosis not present

## 2020-12-15 ENCOUNTER — Ambulatory Visit: Payer: Medicare HMO | Admitting: Family Medicine

## 2020-12-15 DIAGNOSIS — G500 Trigeminal neuralgia: Secondary | ICD-10-CM

## 2020-12-15 DIAGNOSIS — M542 Cervicalgia: Secondary | ICD-10-CM

## 2020-12-15 DIAGNOSIS — G8929 Other chronic pain: Secondary | ICD-10-CM

## 2020-12-15 DIAGNOSIS — M5136 Other intervertebral disc degeneration, lumbar region: Secondary | ICD-10-CM

## 2020-12-17 ENCOUNTER — Telehealth: Payer: Self-pay

## 2020-12-17 NOTE — Telephone Encounter (Signed)
[name] called for a refill of the Hydrocodone for [name]. I advised she was past due for a follow up appointment. He wanted to know if could have an appointment tomorrow. The only openings are acute visit. Please advise.

## 2020-12-17 NOTE — Telephone Encounter (Signed)
Lets see if we can put her on [place] schedule and then when she comes in she can route the prescription to me so that it is in my name.  But she needs to schedule a follow-up.  She knows this every 3 months.  And I even put it on her checkout sheet.  I would highly encourage her to schedule her follow-up on her way out.

## 2020-12-18 ENCOUNTER — Other Ambulatory Visit: Payer: Self-pay

## 2020-12-18 ENCOUNTER — Encounter: Payer: Self-pay | Admitting: Family Medicine

## 2020-12-18 ENCOUNTER — Ambulatory Visit (INDEPENDENT_AMBULATORY_CARE_PROVIDER_SITE_OTHER): Payer: Medicare HMO | Admitting: Family Medicine

## 2020-12-18 DIAGNOSIS — G500 Trigeminal neuralgia: Secondary | ICD-10-CM | POA: Diagnosis not present

## 2020-12-18 DIAGNOSIS — M5136 Other intervertebral disc degeneration, lumbar region: Secondary | ICD-10-CM | POA: Diagnosis not present

## 2020-12-18 DIAGNOSIS — G8929 Other chronic pain: Secondary | ICD-10-CM

## 2020-12-18 DIAGNOSIS — M542 Cervicalgia: Secondary | ICD-10-CM | POA: Diagnosis not present

## 2020-12-18 MED ORDER — HYDROCODONE-ACETAMINOPHEN 5-325 MG PO TABS: ORAL_TABLET | ORAL | 0 refills | Status: DC

## 2020-12-18 NOTE — Progress Notes (Signed)
Established Patient Office Visit  Subjective:  Patient ID: [name], female    DOB: August 03, 1946  Age: 75 y.o. MRN: [id number]  CC:  Chief Complaint  Patient presents with  . Medication Refill    hydrocodone    HPI [name] presents for Norco refill.   Reports she has ran out of her Norco and was unable to get in with PCP last minute. She reports she is taking it as directed, once or twice a day, but not having to use it every day. Denies any adverse effects; no excessive drowsiness, trouble sleeping, chest pain, n/v/d, trouble breathing.  States she is doing well overall. Currently at 80% baseline with mild back pain today maybe 2-3/10. She has had company in town this week and reports that she has been busy and on her feet a lot. She is trying to do her home exercises, walking, and resting appropriately.  UDS up to date (09/10/20)  Opioid Agreement up to date (06/08/20) PDMP reviewed  Past Medical History:  Diagnosis Date  . Breast cancer ([place]) 1999   T1N0 LEFT BREAST  . Cancer ([place]) 03-2006   METASTATIC BREAST TO APPARENT ISOLATED LEFT INTERNAL MAMMARY NODE  . Degenerative disc disease   . History of bladder infections    RECURRENT  . Hyperlipidemia   . Osteoporosis 03-04-2011    LAST BONE DENSITY 03-04-2011  . Recurrent sinusitis   . Thyroid disease   . Trigeminal neuralgia     Past Surgical History:  Procedure Laterality Date  . ABDOMINAL HYSTERECTOMY  2005   Complete  . BLADDER SUSPENSION  2005  . BREAST LUMPECTOMY Left 1999   malignant  . BREAST SURGERY  1999   Lumpectomy L Br  . BREAST SURGERY  2007   Lymph Node Removal L Chest Wall  . cancerous LN removed  2008  . left breast lumpectomy  1999    Family History  Problem Relation Age of Onset  . Cancer Mother        breast  . Stroke Mother   . Hyperlipidemia Mother   . Cancer Father        bladder  . Colon cancer Father   . Cancer Paternal Grandmother        Mouth  . Cancer Son         Breast  . Cancer Maternal Aunt        Breast  . Stomach cancer Paternal Uncle   . Stomach cancer Paternal Uncle     Social History   Socioeconomic History  . Marital status: Married    Spouse name: [name]   . Number of children: 3  . Years of education: Therapist, sports  . Highest education level: Not on file  Occupational History    Comment: Retired- Therapist, sports  Tobacco Use  . Smoking status: Never Smoker  . Smokeless tobacco: Never Used  Vaping Use  . Vaping Use: Never used  Substance and Sexual Activity  . Alcohol use: No    Alcohol/week: 0.0 standard drinks  . Drug use: No  . Sexual activity: Not on file    Comment: housewife, married, 3 kids.  Other Topics Concern  . Not on file  Social History Narrative   Patient lives at home with her husband [name])   Retired - RN   Right handed.   Caffeine two cups coffee daily and one sweet tea.   Social Determinants of Health   Financial Resource Strain: Not on file  Food Insecurity: Not on file  Transportation Needs: Not on file  Physical Activity: Not on file  Stress: Not on file  Social Connections: Not on file  Intimate Partner Violence: Not on file    Outpatient Medications Prior to Visit  Medication Sig Dispense Refill  . azelastine (ASTELIN) 0.1 % nasal spray Place 2 sprays into both nostrils daily.    . Calcium-Magnesium-Vitamin D (CALCIUM 1200+D3 PO) Take by mouth daily.     . carbamazepine (TEGRETOL XR) 100 MG 12 hr tablet Take 100 mg by mouth 2 (two) times daily.    Marland Kitchen denosumab (PROLIA) 60 MG/ML SOLN injection Inject 60 mg into the skin every 6 (six) months. Administer in upper arm, thigh, or abdomen    . dorzolamide-timolol (COSOPT) 22.3-6.8 MG/ML ophthalmic solution 1 drop 2 (two) times daily.    Marland Kitchen EPINEPHrine (EPIPEN 2-PAK) 0.3 mg/0.3 mL IJ SOAJ injection Inject 0.3 mls (0.3 mg total) into the muscles AS NEEDED (allergic reaction) 2 Device prn  . fish oil-omega-3 fatty acids 1000 MG capsule Take 2 g by mouth daily.    .  fluticasone (FLONASE) 50 MCG/ACT nasal spray Place 1 spray into both nostrils daily.  1  . HYDROcodone-acetaminophen (NORCO/VICODIN) 5-325 MG tablet TAKE 1 TABLET EVERY TWELVE HOURS AS NEEDED 60 tablet 0  . latanoprost (XALATAN) 0.005 % ophthalmic solution     . levocetirizine (XYZAL) 5 MG tablet Take 5 mg by mouth daily.    Marland Kitchen levothyroxine (SYNTHROID) 50 MCG tablet TAKE 1 TABLET BY MOUTH DAILY BEFORE BREAKFAST 90 tablet 1  . omeprazole (PRILOSEC) 40 MG capsule TAKE 1 CAPSULE BY MOUTH EVERY DAY 90 capsule 3  . pravastatin (PRAVACHOL) 40 MG tablet TAKE 1 TABLET DAILY 90 tablet 3  . PREMARIN vaginal cream INERT 1 APPLICATORFUL VAGINALLY NIGHTLY 1 WEEK & THEN 3 NIGHTS A WEEK THEREAFTER    . senna-docusate (SENOKOT-S) 8.6-50 MG tablet Take 2 tablets by mouth at bedtime.    . tamoxifen (NOLVADEX) 20 MG tablet Take 1 tablet (20 mg total) by mouth daily. 90 tablet 3  . timolol (TIMOPTIC) 0.25 % ophthalmic solution 1 drop every morning.    . triamterene-hydrochlorothiazide (MAXZIDE-25) 37.5-25 MG tablet Take 1 tablet by mouth daily. 90 tablet 3  . trimethoprim (TRIMPEX) 100 MG tablet Take 100 mg by mouth daily.     No facility-administered medications prior to visit.    Allergies  Allergen Reactions  . Simvastatin Other (See Comments)    Aches and memory loss  . Azithromycin Hives  . Doxycycline Itching  . Other Rash  . Trazodone And Nefazodone Other (See Comments)    Memory impairment and palpitations.   . Ampicillin Rash  . Cefadroxil Rash  . Dicloxacillin Rash    ROS Review of Systems Pernell Dikes B. Olevia Bowens, DNP, FNP-C    Objective:    Physical Exam Vitals reviewed.  Constitutional:      Appearance: Normal appearance. She is normal weight.  HENT:     Head: Normocephalic and atraumatic.  Cardiovascular:     Rate and Rhythm: Normal rate and regular rhythm.     Pulses: Normal pulses.     Heart sounds: Normal heart sounds.  Pulmonary:     Effort: Pulmonary effort is normal.      Breath sounds: Normal breath sounds.  Neurological:     General: No focal deficit present.     Mental Status: She is alert and oriented to person, place, and time. Mental status is at baseline.  Psychiatric:  Mood and Affect: Mood normal.        Behavior: Behavior normal.        Thought Content: Thought content normal.        Judgment: Judgment normal.     BP 139/81 (BP Location: Right Arm, Patient Position: Sitting, Cuff Size: Normal)   Pulse (!) 112   Temp 99.2 F (37.3 C) (Oral)   Wt 137 lb 1.9 oz (62.2 kg)   BMI 21.48 kg/m  Wt Readings from Last 3 Encounters:  12/18/20 137 lb 1.9 oz (62.2 kg)  09/10/20 131 lb (59.4 kg)  06/08/20 133 lb (60.3 kg)     Health Maintenance Due  Topic Date Due  . Hepatitis C Screening  Never done  . DEXA SCAN  04/27/2019  . COVID-19 Vaccine (3 - Inadvertent risk 4-dose series) 11/25/2019    There are no preventive care reminders to display for this patient.  Lab Results  Component Value Date   TSH 3.22 09/10/2020   Lab Results  Component Value Date   WBC 3.9 04/04/2017   HGB 12.3 04/04/2017   HCT 37.2 04/04/2017   MCV 93.9 04/04/2017   PLT 207 04/04/2017   Lab Results  Component Value Date   NA 139 09/10/2020   K 4.2 09/10/2020   CHLORIDE 103 01/31/2017   CO2 30 09/10/2020   GLUCOSE 119 09/10/2020   BUN 19 09/10/2020   CREATININE 1.06 (H) 09/10/2020   BILITOT 0.4 09/10/2020   ALKPHOS 29 (L) 04/04/2017   AST 22 09/10/2020   ALT 12 09/10/2020   PROT 6.8 09/10/2020   ALBUMIN 4.8 04/04/2017   CALCIUM 9.8 09/10/2020   ANIONGAP 9 01/31/2017   EGFR 54 (L) 01/31/2017   Lab Results  Component Value Date   CHOL 243 (H) 09/10/2020   Lab Results  Component Value Date   HDL 71 09/10/2020   Lab Results  Component Value Date   LDLCALC 130 (H) 09/10/2020   Lab Results  Component Value Date   TRIG 276 (H) 09/10/2020   Lab Results  Component Value Date   CHOLHDL 3.4 09/10/2020   No results found for: HGBA1C     Assessment & Plan:  1. Encounter for chronic pain management 2. Degenerative disc disease, lumbar 3. NECK PAIN 4. NEURALGIA, TRIGEMINAL Indication for chronic opioid: DDD Cervical and lumbar Medication and dose: Hydrocodone 5/325 # pills per month: 60  Patient is doing well overall. Taking her norco once or twice a day, and not necessarily needing it every day. UDS (09/10/20) and opioid agreement (06/08/20) up to date. PDMP reviewed. MD to sign off on prescription. Patient instructed to follow-up in 3 months with PCP.   Follow-up: Return in about 3 months (around 03/19/2021) for chronic disease mgt, PCP.    Terrilyn Saver, NP

## 2020-12-18 NOTE — Telephone Encounter (Signed)
Pt scheduled for appt on 12/18/20 at 2:20 with Caleen Jobs NP.

## 2021-01-07 DIAGNOSIS — G501 Atypical facial pain: Secondary | ICD-10-CM | POA: Diagnosis not present

## 2021-01-07 DIAGNOSIS — G500 Trigeminal neuralgia: Secondary | ICD-10-CM | POA: Diagnosis not present

## 2021-01-19 ENCOUNTER — Other Ambulatory Visit: Payer: Self-pay | Admitting: *Deleted

## 2021-01-19 DIAGNOSIS — G500 Trigeminal neuralgia: Secondary | ICD-10-CM

## 2021-01-19 DIAGNOSIS — M5136 Other intervertebral disc degeneration, lumbar region: Secondary | ICD-10-CM

## 2021-01-19 DIAGNOSIS — G8929 Other chronic pain: Secondary | ICD-10-CM

## 2021-01-19 DIAGNOSIS — M542 Cervicalgia: Secondary | ICD-10-CM

## 2021-01-20 MED ORDER — HYDROCODONE-ACETAMINOPHEN 5-325 MG PO TABS
ORAL_TABLET | ORAL | 0 refills | Status: DC
Start: 2021-01-20 — End: 2021-02-22

## 2021-02-12 ENCOUNTER — Telehealth: Payer: Self-pay | Admitting: Family Medicine

## 2021-02-12 NOTE — Progress Notes (Signed)
  Chronic Care Management   Outreach Note  02/12/2021 Name: [name] MRN: [id number] DOB: April 24, 1946  Referred by: Hali Marry, MD Reason for referral : No chief complaint on file.   An unsuccessful telephone outreach was attempted today. The patient was referred to the pharmacist for assistance with care management and care coordination.   Follow Up Plan:   Lauretta Grill Upstream Scheduler

## 2021-02-22 ENCOUNTER — Other Ambulatory Visit: Payer: Self-pay | Admitting: *Deleted

## 2021-02-22 DIAGNOSIS — G8929 Other chronic pain: Secondary | ICD-10-CM

## 2021-02-22 DIAGNOSIS — M542 Cervicalgia: Secondary | ICD-10-CM

## 2021-02-22 DIAGNOSIS — M5136 Other intervertebral disc degeneration, lumbar region: Secondary | ICD-10-CM

## 2021-02-22 DIAGNOSIS — G500 Trigeminal neuralgia: Secondary | ICD-10-CM

## 2021-02-22 MED ORDER — HYDROCODONE-ACETAMINOPHEN 5-325 MG PO TABS
ORAL_TABLET | ORAL | 0 refills | Status: DC
Start: 2021-02-22 — End: 2021-03-24

## 2021-02-22 NOTE — Telephone Encounter (Signed)
Return in about 3 months (around 03/19/2021) for chronic disease mgt, PCP

## 2021-02-26 ENCOUNTER — Telehealth: Payer: Self-pay | Admitting: Family Medicine

## 2021-02-26 NOTE — Chronic Care Management (AMB) (Signed)
  Chronic Care Management   Note  02/26/2021 Name: [name] MRN: [id number] DOB: Aug 11, 1946  [name] is a 75 y.o. year old female who is a primary care patient of Madilyn Fireman, Rene Kocher, MD. I reached out to [name] by phone today in response to a referral sent by [name]'s PCP, Hali Marry, MD.   [name] was given information about Chronic Care Management services today including:  CCM service includes personalized support from designated clinical staff supervised by her physician, including individualized plan of care and coordination with other care providers 24/7 contact phone numbers for assistance for urgent and routine care needs. Service will only be billed when office clinical staff spend 20 minutes or more in a month to coordinate care. Only one practitioner may furnish and bill the service in a calendar month. The patient may stop CCM services at any time (effective at the end of the month) by phone call to the office staff.   Patient did not agree to enrollment in care management services and does not wish to consider at this time.  Follow up plan:   Lauretta Grill Upstream Scheduler

## 2021-03-03 ENCOUNTER — Other Ambulatory Visit: Payer: Self-pay | Admitting: Family Medicine

## 2021-03-19 ENCOUNTER — Other Ambulatory Visit: Payer: Self-pay

## 2021-03-19 ENCOUNTER — Encounter: Payer: Self-pay | Admitting: Family Medicine

## 2021-03-19 ENCOUNTER — Ambulatory Visit (INDEPENDENT_AMBULATORY_CARE_PROVIDER_SITE_OTHER): Payer: Medicare HMO | Admitting: Family Medicine

## 2021-03-19 VITALS — BP 137/82 | HR 102 | Ht 67.0 in | Wt 143.1 lb

## 2021-03-19 DIAGNOSIS — G3184 Mild cognitive impairment, so stated: Secondary | ICD-10-CM | POA: Diagnosis not present

## 2021-03-19 DIAGNOSIS — E039 Hypothyroidism, unspecified: Secondary | ICD-10-CM

## 2021-03-19 DIAGNOSIS — Z79899 Other long term (current) drug therapy: Secondary | ICD-10-CM

## 2021-03-19 DIAGNOSIS — G8929 Other chronic pain: Secondary | ICD-10-CM | POA: Diagnosis not present

## 2021-03-19 MED ORDER — ZOSTER VAC RECOMB ADJUVANTED 50 MCG/0.5ML IM SUSR: 0.5000 mL | Freq: Once | INTRAMUSCULAR | 0 refills | Status: AC

## 2021-03-19 NOTE — Progress Notes (Signed)
Established Patient Office Visit  Subjective:  Patient ID: [name], female    DOB: [birth year]  Age: 75 y.o. MRN: [id number]  CC:  Chief Complaint  Patient presents with   Follow-up    3 month follow up for Chronic Disease Mgt     HPI [name] presents for   Hypothyroidism - Taking medication regularly in the AM away from food and vitamins, etc. No recent change to skin, hair, or energy levels.  Follow-up chronic pain management-she is currently on hydrocodone for her chronic low back pain.  She is doing well overall she says the pain medication is effective in keeping her going.  She does walk about 3 days/week with her husband and that does help.  She has not had any recent flares or exacerbations.   Past Medical History:  Diagnosis Date   Breast cancer ([place]) 1999   T1N0 LEFT BREAST   Cancer ([place]) 03-2006   METASTATIC BREAST TO APPARENT ISOLATED LEFT INTERNAL MAMMARY NODE   Degenerative disc disease    History of bladder infections    RECURRENT   Hyperlipidemia    Osteoporosis 03-04-2011    LAST BONE DENSITY 03-04-2011   Recurrent sinusitis    Thyroid disease    Trigeminal neuralgia     Past Surgical History:  Procedure Laterality Date   ABDOMINAL HYSTERECTOMY  2005   Complete   BLADDER SUSPENSION  2005   BREAST LUMPECTOMY Left 1999   malignant   BREAST SURGERY  1999   Lumpectomy L Br   BREAST SURGERY  2007   Lymph Node Removal L Chest Wall   cancerous LN removed  2008   left breast lumpectomy  1999    Family History  Problem Relation Age of Onset   Cancer Mother        breast   Stroke Mother    Hyperlipidemia Mother    Cancer Father        bladder   Colon cancer Father    Cancer Paternal Grandmother        Mouth   Cancer Son        Breast   Cancer Maternal Aunt        Breast   Stomach cancer Paternal Uncle    Stomach cancer Paternal Uncle     Social History   Socioeconomic History   Marital status: Married    Spouse name: [name]     Number of children: 3   Years of education: RN   Highest education level: Not on file  Occupational History    Comment: Retired- Therapist, sports  Tobacco Use   Smoking status: Never   Smokeless tobacco: Never  Vaping Use   Vaping Use: Never used  Substance and Sexual Activity   Alcohol use: No    Alcohol/week: 0.0 standard drinks   Drug use: No   Sexual activity: Not on file    Comment: housewife, married, 3 kids.  Other Topics Concern   Not on file  Social History Narrative   Patient lives at home with her husband [name])   Retired - RN   Right handed.   Caffeine two cups coffee daily and one sweet tea.   Social Determinants of Health   Financial Resource Strain: Not on file  Food Insecurity: Not on file  Transportation Needs: Not on file  Physical Activity: Not on file  Stress: Not on file  Social Connections: Not on file  Intimate Partner Violence: Not on file  Outpatient Medications Prior to Visit  Medication Sig Dispense Refill   azelastine (ASTELIN) 0.1 % nasal spray Place 2 sprays into both nostrils daily.     Calcium-Magnesium-Vitamin D (CALCIUM 1200+D3 PO) Take by mouth daily.      carbamazepine (TEGRETOL XR) 100 MG 12 hr tablet Take 100 mg by mouth 2 (two) times daily.     denosumab (PROLIA) 60 MG/ML SOLN injection Inject 60 mg into the skin every 6 (six) months. Administer in upper arm, thigh, or abdomen     dorzolamide-timolol (COSOPT) 22.3-6.8 MG/ML ophthalmic solution 1 drop 2 (two) times daily.     fish oil-omega-3 fatty acids 1000 MG capsule Take 2 g by mouth daily.     fluticasone (FLONASE) 50 MCG/ACT nasal spray Place 1 spray into both nostrils daily.  1   HYDROcodone-acetaminophen (NORCO/VICODIN) 5-325 MG tablet TAKE 1 TABLET EVERY TWELVE HOURS AS NEEDED 60 tablet 0   latanoprost (XALATAN) 0.005 % ophthalmic solution      levocetirizine (XYZAL) 5 MG tablet Take 5 mg by mouth daily.     levothyroxine (SYNTHROID) 50 MCG tablet TAKE 1 TABLET BY MOUTH DAILY BEFORE  BREAKFAST 90 tablet 1   omeprazole (PRILOSEC) 40 MG capsule TAKE 1 CAPSULE BY MOUTH EVERY DAY 90 capsule 3   pravastatin (PRAVACHOL) 40 MG tablet TAKE 1 TABLET DAILY 90 tablet 3   PREMARIN vaginal cream INERT 1 APPLICATORFUL VAGINALLY NIGHTLY 1 WEEK & THEN 3 NIGHTS A WEEK THEREAFTER     senna-docusate (SENOKOT-S) 8.6-50 MG tablet Take 2 tablets by mouth at bedtime.     tamoxifen (NOLVADEX) 20 MG tablet Take 1 tablet (20 mg total) by mouth daily. 90 tablet 3   triamterene-hydrochlorothiazide (MAXZIDE-25) 37.5-25 MG tablet TAKE 1 TABLET BY MOUTH EVERY DAY 90 tablet 3   trimethoprim (TRIMPEX) 100 MG tablet Take 100 mg by mouth daily.     EPINEPHrine (EPIPEN 2-PAK) 0.3 mg/0.3 mL IJ SOAJ injection Inject 0.3 mls (0.3 mg total) into the muscles AS NEEDED (allergic reaction) 2 Device prn   timolol (TIMOPTIC) 0.25 % ophthalmic solution 1 drop every morning.     No facility-administered medications prior to visit.    Allergies  Allergen Reactions   Simvastatin Other (See Comments)    Aches and memory loss   Azithromycin Hives   Doxycycline Itching   Other Rash   Trazodone And Nefazodone Other (See Comments)    Memory impairment and palpitations.    Ampicillin Rash   Cefadroxil Rash   Dicloxacillin Rash    ROS Review of Systems    Objective:    Physical Exam Constitutional:      Appearance: Normal appearance. She is well-developed.  HENT:     Head: Normocephalic and atraumatic.  Cardiovascular:     Rate and Rhythm: Normal rate and regular rhythm.     Heart sounds: Normal heart sounds.  Pulmonary:     Effort: Pulmonary effort is normal.     Breath sounds: Normal breath sounds.  Skin:    General: Skin is warm and dry.  Neurological:     Mental Status: She is alert and oriented to person, place, and time.  Psychiatric:        Behavior: Behavior normal.    BP 137/82   Pulse (!) 102   Ht '5\' 7"'  (1.702 m)   Wt 143 lb 1.9 oz (64.9 kg)   SpO2 95%   BMI 22.42 kg/m  Wt Readings  from Last 3 Encounters:  03/19/21 143 lb 1.9  oz (64.9 kg)  12/18/20 137 lb 1.9 oz (62.2 kg)  09/10/20 131 lb (59.4 kg)     Health Maintenance Due  Topic Date Due   Hepatitis C Screening  Never done   Zoster Vaccines- Shingrix (1 of 2) Never done   DEXA SCAN  04/27/2019   COVID-19 Vaccine (3 - Mixed Product risk series) 11/25/2019    There are no preventive care reminders to display for this patient.  Lab Results  Component Value Date   TSH 3.22 09/10/2020   Lab Results  Component Value Date   WBC 3.9 04/04/2017   HGB 12.3 04/04/2017   HCT 37.2 04/04/2017   MCV 93.9 04/04/2017   PLT 207 04/04/2017   Lab Results  Component Value Date   NA 139 09/10/2020   K 4.2 09/10/2020   CHLORIDE 103 01/31/2017   CO2 30 09/10/2020   GLUCOSE 119 09/10/2020   BUN 19 09/10/2020   CREATININE 1.06 (H) 09/10/2020   BILITOT 0.4 09/10/2020   ALKPHOS 29 (L) 04/04/2017   AST 22 09/10/2020   ALT 12 09/10/2020   PROT 6.8 09/10/2020   ALBUMIN 4.8 04/04/2017   CALCIUM 9.8 09/10/2020   ANIONGAP 9 01/31/2017   EGFR 54 (L) 01/31/2017   Lab Results  Component Value Date   CHOL 243 (H) 09/10/2020   Lab Results  Component Value Date   HDL 71 09/10/2020   Lab Results  Component Value Date   LDLCALC 130 (H) 09/10/2020   Lab Results  Component Value Date   TRIG 276 (H) 09/10/2020   Lab Results  Component Value Date   CHOLHDL 3.4 09/10/2020   No results found for: HGBA1C    Assessment & Plan:   Problem List Items Addressed This Visit       Endocrine   Hypothyroidism    Due to rechekc TSH.         Relevant Orders   TSH     Other   Mild cognitive impairment    Will test again at f/u IN 3 months.         High risk medication use   Relevant Orders   BASIC METABOLIC PANEL WITH GFR   Encounter for chronic pain management - Primary    Indication for chronic opioid: DDD Cervical and lumbar Medication and dose: Hydrocodone 5/325 # pills per month: 60    Last UDS date:  03/19/2020 Opioid Treatment Agreement signed (Y/N): Y Opioid Treatment Agreement last reviewed with patient: 06/08/2020 [facility] reviewed this encounter (include red flags):  Y         Meds ordered this encounter  Medications   Zoster Vaccine Adjuvanted [facility]) injection    Sig: Inject 0.5 mLs into the muscle once for 1 dose. Repeat dose in 2-6 months.    Dispense:  0.5 mL    Refill:  0   Urged her to get her shingles vaccine. Rx printed.    Follow-up: Return in about 3 months (around 06/19/2021) for Chronic  Pain Management .    Beatrice Lecher, MD

## 2021-03-19 NOTE — Assessment & Plan Note (Signed)
Will test again at f/u IN 3 months.

## 2021-03-19 NOTE — Assessment & Plan Note (Signed)
Due to rechekc TSH.

## 2021-03-19 NOTE — Assessment & Plan Note (Signed)
Indication for chronic opioid: DDD Cervical and lumbar Medication and dose: Hydrocodone 5/325 # pills per month: 60 Last UDS date: 03/19/2020 Opioid Treatment Agreement signed (Y/N): Y Opioid Treatment Agreement last reviewed with patient:06/08/2020 [place] reviewed this encounter (include red flags): Darreld Mclean

## 2021-03-20 LAB — BASIC METABOLIC PANEL WITH GFR
BUN/Creatinine Ratio: 20 (calc) (ref 6–22)
BUN: 21 mg/dL (ref 7–25)
CO2: 29 mmol/L (ref 20–32)
Calcium: 9.6 mg/dL (ref 8.6–10.4)
Chloride: 103 mmol/L (ref 98–110)
Creat: 1.05 mg/dL — ABNORMAL HIGH (ref 0.60–1.00)
Glucose, Bld: 99 mg/dL (ref 65–99)
Potassium: 4.3 mmol/L (ref 3.5–5.3)
Sodium: 140 mmol/L (ref 135–146)
eGFR: 56 mL/min/{1.73_m2} — ABNORMAL LOW (ref >=60)

## 2021-03-20 LAB — TSH: TSH: 3.19 mIU/L (ref 0.40–4.50)

## 2021-03-22 NOTE — Progress Notes (Signed)
All labs are normal. 

## 2021-03-24 ENCOUNTER — Other Ambulatory Visit: Payer: Self-pay | Admitting: *Deleted

## 2021-03-24 DIAGNOSIS — M5136 Other intervertebral disc degeneration, lumbar region: Secondary | ICD-10-CM

## 2021-03-24 DIAGNOSIS — G500 Trigeminal neuralgia: Secondary | ICD-10-CM

## 2021-03-24 DIAGNOSIS — M542 Cervicalgia: Secondary | ICD-10-CM

## 2021-03-24 DIAGNOSIS — G8929 Other chronic pain: Secondary | ICD-10-CM

## 2021-03-24 MED ORDER — HYDROCODONE-ACETAMINOPHEN 5-325 MG PO TABS: ORAL_TABLET | ORAL | 0 refills | Status: DC

## 2021-03-24 NOTE — Telephone Encounter (Signed)
Called for refill on pain medication

## 2021-03-31 ENCOUNTER — Telehealth: Payer: Self-pay

## 2021-03-31 NOTE — Telephone Encounter (Signed)
Pt's spouse informed.  Pt stated to spouse that she wishes to try the increased dosage of omeprazole before having a referral to GI.  Charyl Bigger, CMA

## 2021-03-31 NOTE — Telephone Encounter (Signed)
Pt's spouse called stating that the pt is taking '40mg'$  of omeprazole daily, however she is still experiencing some indigestion.  Spouse would like to know if there is anything else that she can take to relieve symptoms.  Please advise.  Charyl Bigger, CMA

## 2021-03-31 NOTE — Telephone Encounter (Signed)
For the short-term she can increase the medication to twice a day.  Make sure taking 30 minutes before first meal the day and at bedtime.  But I would also recommend a GI referral for further work-up if she is having breakthrough symptoms on a proton pump inhibitor.  If she is okay with referral please see if she has a preference for provider or location and place referral.

## 2021-04-13 ENCOUNTER — Telehealth: Payer: Self-pay | Admitting: Lab

## 2021-04-13 NOTE — Chronic Care Management (AMB) (Signed)
  Chronic Care Management   Note  04/13/2021 Name: ANGILEE WOOLDRIDGE MRN: [id number] DOB: 05-15-1946  York Pellant Fliegel is a 75 y.o. year old female who is a primary care patient of Madilyn Fireman, Rene Kocher, MD. I reached out to Lourena Simmonds by phone today in response to a referral sent by Ms. Shaquasia W Ainley's PCP, Hali Marry, MD.   Ms. Alexiou was given information about Chronic Care Management services today including:  CCM service includes personalized support from designated clinical staff supervised by her physician, including individualized plan of care and coordination with other care providers 24/7 contact phone numbers for assistance for urgent and routine care needs. Service will only be billed when office clinical staff spend 20 minutes or more in a month to coordinate care. Only one practitioner may furnish and bill the service in a calendar month. The patient may stop CCM services at any time (effective at the end of the month) by phone call to the office staff.   Patient agreed to services and verbal consent obtained.   Follow up plan:   [place]

## 2021-04-16 ENCOUNTER — Telehealth: Payer: Self-pay

## 2021-04-16 NOTE — Telephone Encounter (Signed)
[name] called for [employer]. This morning she was still having indigestion and had not had a bowel movement. Called him back and she has had 2 bowel movements and indigestion is not as bad. She will continue taking MiraLax , she also added a daily fiber gummy to help . She will continue taking the omeprazole as well.  She is feeling much better for now and they will call back if things change.

## 2021-04-23 ENCOUNTER — Other Ambulatory Visit: Payer: Self-pay

## 2021-04-23 DIAGNOSIS — G500 Trigeminal neuralgia: Secondary | ICD-10-CM

## 2021-04-23 DIAGNOSIS — Z170 Estrogen receptor positive status [ER+]: Secondary | ICD-10-CM | POA: Diagnosis not present

## 2021-04-23 DIAGNOSIS — G8929 Other chronic pain: Secondary | ICD-10-CM

## 2021-04-23 DIAGNOSIS — C50512 Malignant neoplasm of lower-outer quadrant of left female breast: Secondary | ICD-10-CM | POA: Diagnosis not present

## 2021-04-23 DIAGNOSIS — M542 Cervicalgia: Secondary | ICD-10-CM

## 2021-04-23 DIAGNOSIS — M5136 Other intervertebral disc degeneration, lumbar region: Secondary | ICD-10-CM

## 2021-04-23 MED ORDER — HYDROCODONE-ACETAMINOPHEN 5-325 MG PO TABS: ORAL_TABLET | ORAL | 0 refills | Status: DC

## 2021-05-10 DIAGNOSIS — Z170 Estrogen receptor positive status [ER+]: Secondary | ICD-10-CM | POA: Diagnosis not present

## 2021-05-10 DIAGNOSIS — C50512 Malignant neoplasm of lower-outer quadrant of left female breast: Secondary | ICD-10-CM | POA: Diagnosis not present

## 2021-05-12 ENCOUNTER — Ambulatory Visit (INDEPENDENT_AMBULATORY_CARE_PROVIDER_SITE_OTHER): Payer: Medicare HMO

## 2021-05-12 ENCOUNTER — Other Ambulatory Visit: Payer: Self-pay

## 2021-05-12 DIAGNOSIS — Z0000 Encounter for general adult medical examination without abnormal findings: Secondary | ICD-10-CM

## 2021-05-12 NOTE — Progress Notes (Addendum)
Virtual Visit via Telephone Note  I connected with  [name] on 05/12/21 at  5:40 PM EDT by telephone and verified that I am speaking with the correct person using two identifiers.  Medicare Annual Wellness visit completed telephonically due to Covid-19 pandemic.   Persons participating in this call: This Health Coach and this patient.   Location: Patient: Home Provider: Office   I discussed the limitations, risks, security and privacy concerns of performing an evaluation and management service by telephone and the availability of in person appointments. The patient expressed understanding and agreed to proceed.  Unable to perform video visit due to video visit attempted and failed and/or patient does not have video capability.   Some vital signs may be absent or patient reported.   Willette Brace, LPN   Subjective:   [name] is a 75 y.o. female who presents for Medicare Annual (Subsequent) preventive examination.  Review of Systems     Cardiac Risk Factors include: advanced age (>68mn, >>85women);dyslipidemia     Objective:    There were no vitals filed for this visit. There is no height or weight on file to calculate BMI.  Advanced Directives 05/12/2021 01/31/2017 08/01/2016 02/01/2016 08/03/2015 03/18/2015 03/11/2015  Does Patient Have a Medical Advance Directive? Yes No No No No - No  Type of Advance [place]ive [place]? No - copy requested - - - - - -  Would patient like information on creating a medical advance directive? - - - Yes - Educational materials given No - patient declined information No - patient declined information No - patient declined information    Current Medications (verified) Outpatient Encounter Medications as of 05/12/2021  Medication Sig   azelastine (ASTELIN) 0.1 % nasal spray Place 2 sprays into both nostrils daily.   Calcium-Magnesium-Vitamin D (CALCIUM 1200+D3  PO) Take by mouth daily.    carbamazepine (TEGRETOL XR) 100 MG 12 hr tablet Take 100 mg by mouth 2 (two) times daily.   denosumab (PROLIA) 60 MG/ML SOLN injection Inject 60 mg into the skin every 6 (six) months. Administer in upper arm, thigh, or abdomen   dorzolamide-timolol (COSOPT) 22.3-6.8 MG/ML ophthalmic solution 1 drop 2 (two) times daily.   fish oil-omega-3 fatty acids 1000 MG capsule Take 2 g by mouth daily.   fluticasone (FLONASE) 50 MCG/ACT nasal spray Place 1 spray into both nostrils daily.   HYDROcodone-acetaminophen (NORCO/VICODIN) 5-325 MG tablet TAKE 1 TABLET EVERY TWELVE HOURS AS NEEDED   latanoprost (XALATAN) 0.005 % ophthalmic solution    levocetirizine (XYZAL) 5 MG tablet Take 5 mg by mouth daily.   levothyroxine (SYNTHROID) 50 MCG tablet TAKE 1 TABLET BY MOUTH DAILY BEFORE BREAKFAST   omeprazole (PRILOSEC) 40 MG capsule TAKE 1 CAPSULE BY MOUTH EVERY DAY   pravastatin (PRAVACHOL) 40 MG tablet TAKE 1 TABLET DAILY   PREMARIN vaginal cream INERT 1 APPLICATORFUL VAGINALLY NIGHTLY 1 WEEK & THEN 3 NIGHTS A WEEK THEREAFTER   senna-docusate (SENOKOT-S) 8.6-50 MG tablet Take 2 tablets by mouth at bedtime.   tamoxifen (NOLVADEX) 20 MG tablet Take 1 tablet (20 mg total) by mouth daily.   trimethoprim (TRIMPEX) 100 MG tablet Take 100 mg by mouth daily.   SHINGRIX injection    triamterene-hydrochlorothiazide (MAXZIDE-25) 37.5-25 MG tablet TAKE 1 TABLET BY MOUTH EVERY DAY (Patient not taking: Reported on 05/12/2021)   No facility-administered encounter medications on file as  of 05/12/2021.    Allergies (verified) Simvastatin, Azithromycin, Doxycycline, Other, Trazodone and nefazodone, Ampicillin, Cefadroxil, and Dicloxacillin   History: Past Medical History:  Diagnosis Date   Breast cancer ([place]) 1999   T1N0 LEFT BREAST   Cancer ([place]) 03-2006   METASTATIC BREAST TO APPARENT ISOLATED LEFT INTERNAL MAMMARY NODE   Degenerative disc disease    History of bladder infections     RECURRENT   Hyperlipidemia    Osteoporosis 03-04-2011    LAST BONE DENSITY 03-04-2011   Recurrent sinusitis    Thyroid disease    Trigeminal neuralgia    Past Surgical History:  Procedure Laterality Date   ABDOMINAL HYSTERECTOMY  2005   Complete   BLADDER SUSPENSION  2005   BREAST LUMPECTOMY Left 1999   malignant   BREAST SURGERY  1999   Lumpectomy L Br   BREAST SURGERY  2007   Lymph Node Removal L Chest Wall   cancerous LN removed  2008   left breast lumpectomy  1999   Family History  Problem Relation Age of Onset   Cancer Mother        breast   Stroke Mother    Hyperlipidemia Mother    Cancer Father        bladder   Colon cancer Father    Cancer Paternal Grandmother        Mouth   Cancer Son        Breast   Cancer Maternal Aunt        Breast   Stomach cancer Paternal Uncle    Stomach cancer Paternal Uncle    Social History   Socioeconomic History   Marital status: Married    Spouse name: [name]    Number of children: 3   Years of education: RN   Highest education level: Not on file  Occupational History    Comment: Retired- Therapist, sports  Tobacco Use   Smoking status: Never   Smokeless tobacco: Never  Vaping Use   Vaping Use: Never used  Substance and Sexual Activity   Alcohol use: No    Alcohol/week: 0.0 standard drinks   Drug use: No   Sexual activity: Not on file    Comment: housewife, married, 3 kids.  Other Topics Concern   Not on file  Social History Narrative   Patient lives at home with her husband [name])   Retired - RN   Right handed.   Caffeine two cups coffee daily and one sweet tea.   Social Determinants of Health   Financial Resource Strain: Low Risk    Difficulty of Paying Living Expenses: Not hard at all  Food Insecurity: No Food Insecurity   Worried About Charity fundraiser in the Last Year: Never true   [place] in the Last Year: Never true  Transportation Needs: No Transportation Needs   Lack of Transportation (Medical): No    Lack of Transportation (Non-Medical): No  Physical Activity: Insufficiently Active   Days of Exercise per Week: 3 days   Minutes of Exercise per Session: 30 min  Stress: No Stress Concern Present   Feeling of Stress : Not at all  Social Connections: Moderately Integrated   Frequency of Communication with Friends and Family: More than three times a week   Frequency of Social Gatherings with Friends and Family: More than three times a week   Attends Religious Services: More than 4 times per year   Active Member of Clubs or Organizations: No  Attends Archivist Meetings: Never   Marital Status: Married    Tobacco Counseling Counseling given: Not Answered   Clinical Intake:  Pre-visit preparation completed: Yes  Pain : No/denies pain     BMI - recorded: 22.42 Nutritional Status: BMI of 19-24  Normal Nutritional Risks: None Diabetes: No  How often do you need to have someone help you when you read instructions, pamphlets, or other written materials from your doctor or pharmacy?: 1 - Never  Diabetic?No  Interpreter Needed?: No  Information entered by :: Charlott Rakes, lpn   Activities of Daily Living In your present state of health, do you have any difficulty performing the following activities: 05/12/2021  Hearing? N  Vision? N  Difficulty concentrating or making decisions? N  Walking or climbing stairs? N  Dressing or bathing? N  Doing errands, shopping? N  Preparing Food and eating ? N  Using the Toilet? N  In the past six months, have you accidently leaked urine? N  Do you have problems with loss of bowel control? N  Managing your Medications? N  Managing your Finances? N  Housekeeping or managing your Housekeeping? N  Some recent data might be hidden    Patient Care Team: Hali Marry, MD as PCP - General Gordy Levan, MD as Consulting Physician (Hematology and Oncology) Leta Baptist, MD as Consulting Physician  (Otolaryngology) Bjorn Loser, MD as Consulting Physician (Urology) Darius Bump, [facility] as Pharmacist (Pharmacist)  Indicate any recent Medical Services you may have received from other than [facility] providers in the past year (date may be approximate).     Assessment:   This is a routine wellness examination for Jaylia.  Hearing/Vision screen Hearing Screening - Comments:: Pt denies any hearing issues  Vision Screening - Comments:: Pt follow up with Dr Jodi Mourning For annual eye exams   Dietary issues and exercise activities discussed: Current Exercise Habits: Home exercise routine, Time (Minutes): 30, Frequency (Times/Week): 3, Weekly Exercise (Minutes/Week): 90   Goals Addressed             This Visit's Progress    Patient Stated       Keep walking for exercise        Depression Screen PHQ 2/9 Scores 05/12/2021 03/19/2021 09/10/2020 07/10/2019 04/09/2019 01/01/2019 01/29/2018  PHQ - 2 Score 0 0 0 0 0 0 0  PHQ- 9 Score - - - - - - -    Fall Risk Fall Risk  05/12/2021 03/19/2021 07/10/2019 04/09/2019 08/31/2017  Falls in the past year? 0 0 0 0 No  Number falls in past yr: 0 0 0 0 -  Comment - - - - -  Injury with Fall? 0 - 0 0 -  Risk Factor Category  - - - - -  Risk for fall due to : Impaired vision - - - -  Follow up Falls prevention discussed - - - -    FALL RISK PREVENTION PERTAINING TO THE HOME:  Any stairs in or around the home? Yes  If so, are there any without handrails? No  Home free of loose throw rugs in walkways, pet beds, electrical cords, etc? Yes  Adequate lighting in your home to reduce risk of falls? Yes   ASSISTIVE DEVICES UTILIZED TO PREVENT FALLS:   Life alert? No  Use of a cane, walker or w/c? No  Grab bars in the bathroom? Yes  Shower chair or bench in shower? No  Elevated toilet seat or a handicapped  toilet? No   TIMED UP AND GO:  Was the test performed? No .  Cognitive Function: MMSE - Mini Mental State Exam 11/19/2019 07/10/2019 04/09/2019   Orientation to time '5 2 2  '$ Orientation to Place '4 3 5  '$ Registration '3 3 3  '$ Attention/ Calculation '5 4 5  '$ Recall '2 1 3  '$ Language- name 2 objects '1 2 2  '$ Language- repeat '1 1 1  '$ Language- follow 3 step command '2 3 3  '$ Language- read & follow direction '1 1 1  '$ Write a sentence '1 1 1  '$ Copy design '1 1 1  '$ Total score '26 22 27     '$ 6CIT Screen 05/12/2021 04/04/2017  What Year? 0 points 0 points  What month? 3 points 0 points  What time? 0 points 0 points  Count back from 20 0 points 0 points  Months in reverse 0 points 2 points  Repeat phrase 10 points 8 points  Total Score 13 10    Immunizations Immunization History  Administered Date(s) Administered   Fluad Quad(high Dose 65+) 04/29/2019, 06/08/2020   Influenza Whole 04/21/2011   Influenza, High Dose Seasonal PF 06/07/2018   Influenza,inj,Quad PF,6+ Mos 05/27/2013, 04/21/2014, 05/25/2015, 05/25/2017   Influenza-Unspecified 05/16/2016   Moderna Sars-Covid-2 Vaccination 10/07/2019, 10/28/2019   Pneumococcal Conjugate-13 11/13/2013   Pneumococcal Polysaccharide-23 01/28/2015   Tdap 09/13/2011   Zoster Recombinat (Shingrix) 03/19/2021, 05/12/2021    TDAP status: Up to date  Flu Vaccine status: Due, Education has been provided regarding the importance of this vaccine. Advised may receive this vaccine at local pharmacy or Health Dept. Aware to provide a copy of the vaccination record if obtained from local pharmacy or Health Dept. Verbalized acceptance and understanding.  Pneumococcal vaccine status: Up to date  Covid-19 vaccine status: Completed vaccines  Qualifies for Shingles Vaccine? Yes   Zostavax completed Yes   Shingrix Completed?: Yes  Screening Tests Health Maintenance  Topic Date Due   Hepatitis C Screening  Never done   DEXA SCAN  04/27/2019   COVID-19 Vaccine (3 - Mixed Product risk series) 11/25/2019   INFLUENZA VACCINE  03/29/2021   TETANUS/TDAP  09/12/2021   MAMMOGRAM  04/09/2022   Fecal DNA (Cologuard)   03/28/2023   PNA vac Low Risk Adult  Completed   Zoster Vaccines- Shingrix  Completed   HPV VACCINES  Aged Out    Health Maintenance  Health Maintenance Due  Topic Date Due   Hepatitis C Screening  Never done   DEXA SCAN  04/27/2019   COVID-19 Vaccine (3 - Mixed Product risk series) 11/25/2019   INFLUENZA VACCINE  03/29/2021    Colorectal cancer screening: Type of screening: Cologuard. Completed 03/27/20. Repeat every 3 years  Mammogram status: Completed 04/09/20. Repeat every year    Additional Screening:  Hepatitis C Screening: does qualify  Vision Screening: Recommended annual ophthalmology exams for early detection of glaucoma and other disorders of the eye. Is the patient up to date with their annual eye exam?  Yes  Who is the provider or what is the name of the office in which the patient attends annual eye exams? Dr Jodi Mourning  If pt is not established with a provider, would they like to be referred to a provider to establish care? No .   Dental Screening: Recommended annual dental exams for proper oral hygiene  Community Resource Referral / Chronic Care Management: CRR required this visit?  No   CCM required this visit?  No      Plan:  I have personally reviewed and noted the following in the patient's chart:   Medical and social history Use of alcohol, tobacco or illicit drugs  Current medications and supplements including opioid prescriptions.  Functional ability and status Nutritional status Physical activity Advanced directives List of other physicians Hospitalizations, surgeries, and ER visits in previous 12 months Vitals Screenings to include cognitive, depression, and falls Referrals and appointments  In addition, I have reviewed and discussed with patient certain preventive protocols, quality metrics, and best practice recommendations. A written personalized care plan for preventive services as well as general preventive health recommendations  were provided to patient.     Willette Brace, LPN   [phone]   Nurse Notes: None

## 2021-05-12 NOTE — Patient Instructions (Addendum)
[name] , Thank you for taking time to come for your Medicare Wellness Visit. I appreciate your ongoing commitment to your health goals. Please review the following plan we discussed and let me know if I can assist you in the future.   Screening recommendations/referrals: Colonoscopy: Cologuard completed 03/27/20 repeat every 3 years  Mammogram: Done 04/09/20 Bone Density: pt will follow Dr  Recommended yearly ophthalmology/optometry visit for glaucoma screening and checkup Recommended yearly dental visit for hygiene and checkup  Vaccinations: Influenza vaccine: Due Pneumococcal vaccine: Completed  Tdap vaccine: Done 09/13/11 repeat every 10 years due 09/12/21 Shingles vaccine: Done 03/19/21  follow up with 05/12/21 Covid-19:Completed 2/8, & 10/28/19  Advanced directives: Please bring a copy of your health care power of attorney and living will to the office at your convenience.  Conditions/risks identified: None at this time   Next appointment: Follow up in one year for your annual wellness visit    Preventive Care 74 Years and Older, Female Preventive care refers to lifestyle choices and visits with your health care provider that can promote health and wellness. What does preventive care include? A yearly physical exam. This is also called an annual well check. Dental exams once or twice a year. Routine eye exams. Ask your health care provider how often you should have your eyes checked. Personal lifestyle choices, including: Daily care of your teeth and gums. Regular physical activity. Eating a healthy diet. Avoiding tobacco and drug use. Limiting alcohol use. Practicing safe sex. Taking low-dose aspirin every day. Taking vitamin and mineral supplements as recommended by your health care provider. What happens during an annual well check? The services and screenings done by your health care provider during your annual well check will depend on your age, overall health, lifestyle  risk factors, and family history of disease. Counseling  Your health care provider may ask you questions about your: Alcohol use. Tobacco use. Drug use. Emotional well-being. Home and relationship well-being. Sexual activity. Eating habits. History of falls. Memory and ability to understand (cognition). Work and work Statistician. Reproductive health. Screening  You may have the following tests or measurements: Height, weight, and BMI. Blood pressure. Lipid and cholesterol levels. These may be checked every 5 years, or more frequently if you are over 23 years old. Skin check. Lung cancer screening. You may have this screening every year starting at age 69 if you have a 30-pack-year history of smoking and currently smoke or have quit within the past 15 years. Fecal occult blood test (FOBT) of the stool. You may have this test every year starting at age 63. Flexible sigmoidoscopy or colonoscopy. You may have a sigmoidoscopy every 5 years or a colonoscopy every 10 years starting at age 63. Hepatitis C blood test. Hepatitis B blood test. Sexually transmitted disease (STD) testing. Diabetes screening. This is done by checking your blood sugar (glucose) after you have not eaten for a while (fasting). You may have this done every 1-3 years. Bone density scan. This is done to screen for osteoporosis. You may have this done starting at age 52. Mammogram. This may be done every 1-2 years. Talk to your health care provider about how often you should have regular mammograms. Talk with your health care provider about your test results, treatment options, and if necessary, the need for more tests. Vaccines  Your health care provider may recommend certain vaccines, such as: Influenza vaccine. This is recommended every year. Tetanus, diphtheria, and acellular pertussis (Tdap, Td) vaccine. You may need  a Td booster every 10 years. Zoster vaccine. You may need this after age 25. Pneumococcal  13-valent conjugate (PCV13) vaccine. One dose is recommended after age 40. Pneumococcal polysaccharide (PPSV23) vaccine. One dose is recommended after age 69. Talk to your health care provider about which screenings and vaccines you need and how often you need them. This information is not intended to replace advice given to you by your health care provider. Make sure you discuss any questions you have with your health care provider. Document Released: 09/11/2015 Document Revised: 05/04/2016 Document Reviewed: 06/16/2015 Elsevier Interactive Patient Education  2017 [place] Prevention in the Home Falls can cause injuries. They can happen to people of all ages. There are many things you can do to make your home safe and to help prevent falls. What can I do on the outside of my home? Regularly fix the edges of walkways and driveways and fix any cracks. Remove anything that might make you trip as you walk through a door, such as a raised step or threshold. Trim any bushes or trees on the path to your home. Use bright outdoor lighting. Clear any walking paths of anything that might make someone trip, such as rocks or tools. Regularly check to see if handrails are loose or broken. Make sure that both sides of any steps have handrails. Any raised decks and porches should have guardrails on the edges. Have any leaves, snow, or ice cleared regularly. Use sand or salt on walking paths during winter. Clean up any spills in your garage right away. This includes oil or grease spills. What can I do in the bathroom? Use night lights. Install grab bars by the toilet and in the tub and shower. Do not use towel bars as grab bars. Use non-skid mats or decals in the tub or shower. If you need to sit down in the shower, use a plastic, non-slip stool. Keep the floor dry. Clean up any water that spills on the floor as soon as it happens. Remove soap buildup in the tub or shower regularly. Attach  bath mats securely with double-sided non-slip rug tape. Do not have throw rugs and other things on the floor that can make you trip. What can I do in the bedroom? Use night lights. Make sure that you have a light by your bed that is easy to reach. Do not use any sheets or blankets that are too big for your bed. They should not hang down onto the floor. Have a firm chair that has side arms. You can use this for support while you get dressed. Do not have throw rugs and other things on the floor that can make you trip. What can I do in the kitchen? Clean up any spills right away. Avoid walking on wet floors. Keep items that you use a lot in easy-to-reach places. If you need to reach something above you, use a strong step stool that has a grab bar. Keep electrical cords out of the way. Do not use floor polish or wax that makes floors slippery. If you must use wax, use non-skid floor wax. Do not have throw rugs and other things on the floor that can make you trip. What can I do with my stairs? Do not leave any items on the stairs. Make sure that there are handrails on both sides of the stairs and use them. Fix handrails that are broken or loose. Make sure that handrails are as long as the stairways. Check  any carpeting to make sure that it is firmly attached to the stairs. Fix any carpet that is loose or worn. Avoid having throw rugs at the top or bottom of the stairs. If you do have throw rugs, attach them to the floor with carpet tape. Make sure that you have a light switch at the top of the stairs and the bottom of the stairs. If you do not have them, ask someone to add them for you. What else can I do to help prevent falls? Wear shoes that: Do not have high heels. Have rubber bottoms. Are comfortable and fit you well. Are closed at the toe. Do not wear sandals. If you use a stepladder: Make sure that it is fully opened. Do not climb a closed stepladder. Make sure that both sides of the  stepladder are locked into place. Ask someone to hold it for you, if possible. Clearly mark and make sure that you can see: Any grab bars or handrails. First and last steps. Where the edge of each step is. Use tools that help you move around (mobility aids) if they are needed. These include: Canes. Walkers. Scooters. Crutches. Turn on the lights when you go into a dark area. Replace any light bulbs as soon as they burn out. Set up your furniture so you have a clear path. Avoid moving your furniture around. If any of your floors are uneven, fix them. If there are any pets around you, be aware of where they are. Review your medicines with your doctor. Some medicines can make you feel dizzy. This can increase your chance of falling. Ask your doctor what other things that you can do to help prevent falls. This information is not intended to replace advice given to you by your health care provider. Make sure you discuss any questions you have with your health care provider. Document Released: 06/11/2009 Document Revised: 01/21/2016 Document Reviewed: 09/19/2014 Elsevier Interactive Patient Education  2017 [employer].

## 2021-05-13 ENCOUNTER — Other Ambulatory Visit: Payer: Self-pay | Admitting: Family Medicine

## 2021-05-13 DIAGNOSIS — E039 Hypothyroidism, unspecified: Secondary | ICD-10-CM

## 2021-05-24 ENCOUNTER — Other Ambulatory Visit: Payer: Self-pay | Admitting: *Deleted

## 2021-05-24 DIAGNOSIS — M5136 Other intervertebral disc degeneration, lumbar region: Secondary | ICD-10-CM

## 2021-05-24 DIAGNOSIS — M542 Cervicalgia: Secondary | ICD-10-CM

## 2021-05-24 DIAGNOSIS — G8929 Other chronic pain: Secondary | ICD-10-CM

## 2021-05-24 DIAGNOSIS — G500 Trigeminal neuralgia: Secondary | ICD-10-CM

## 2021-05-25 MED ORDER — HYDROCODONE-ACETAMINOPHEN 5-325 MG PO TABS: ORAL_TABLET | ORAL | 0 refills | Status: DC

## 2021-05-31 ENCOUNTER — Telehealth: Payer: Medicare HMO

## 2021-05-31 ENCOUNTER — Telehealth: Payer: Self-pay | Admitting: *Deleted

## 2021-05-31 NOTE — Chronic Care Management (AMB) (Signed)
  Care Management   Note  05/31/2021 Name: [name] MRN: [id number] DOB: March 15, 1946  [name] is a 75 y.o. year old female who is a primary care patient of Hali Marry, MD and is actively engaged with the care management team. I reached out to Lourena Simmonds by phone today to assist with re-scheduling an initial visit with the Pharmacist  Follow up plan: Unsuccessful telephone outreach attempt made. A HIPAA compliant phone message was left for the patient providing contact information and requesting a return call.   Julian Hy, [place] Management  Direct Dial: [phone]

## 2021-06-04 NOTE — Chronic Care Management (AMB) (Signed)
  Care Management   Note  06/04/2021 Name: [name] MRN: [id number] DOB: [birth year]  [name] is a 75 y.o. year old female who is a primary care patient of Hali Marry, MD and is actively engaged with the care management team. I reached out to Lourena Simmonds by phone today to assist with re-scheduling an initial visit with the Pharmacist  Follow up plan: 2nd attempt Unsuccessful telephone outreach attempt made. A HIPAA compliant phone message was left for the patient providing contact information and requesting a return call.   Julian Hy, [place] Management  Direct Dial: [phone]

## 2021-06-09 NOTE — Chronic Care Management (AMB) (Signed)
  Care Management   Note  06/09/2021 Name: TANIS HENSARLING MRN: [id number] DOB: June 04, 1946  [name] is a 75 y.o. year old female who is a primary care patient of Hali Marry, MD and is actively engaged with the care management team. I reached out to Lourena Simmonds by phone today to assist with re-scheduling an initial visit with the Pharmacist  Follow up plan: Patient declines further follow up and engagement by the care management team. Appropriate care team members and provider have been notified via electronic communication.   Julian Hy, [facility]  Direct Dial: [phone]

## 2021-06-22 ENCOUNTER — Encounter: Payer: Self-pay | Admitting: Family Medicine

## 2021-06-22 ENCOUNTER — Ambulatory Visit (INDEPENDENT_AMBULATORY_CARE_PROVIDER_SITE_OTHER): Payer: Medicare HMO | Admitting: Family Medicine

## 2021-06-22 VITALS — BP 135/79 | HR 110 | Ht 67.0 in | Wt 139.0 lb

## 2021-06-22 DIAGNOSIS — M542 Cervicalgia: Secondary | ICD-10-CM

## 2021-06-22 DIAGNOSIS — M5136 Other intervertebral disc degeneration, lumbar region: Secondary | ICD-10-CM | POA: Diagnosis not present

## 2021-06-22 DIAGNOSIS — G500 Trigeminal neuralgia: Secondary | ICD-10-CM | POA: Diagnosis not present

## 2021-06-22 DIAGNOSIS — G8929 Other chronic pain: Secondary | ICD-10-CM

## 2021-06-22 MED ORDER — HYDROCODONE-ACETAMINOPHEN 5-325 MG PO TABS: ORAL_TABLET | ORAL | 0 refills | Status: DC

## 2021-06-22 NOTE — Progress Notes (Signed)
Established Patient Office Visit  Subjective:  Patient ID: [name], female    DOB: 1945/10/17  Age: 75 y.o. MRN: [id number]  CC:  Chief Complaint  Patient presents with   Pain Management     HPI [name] presents for   Follow-up chronic pain management-she is currently on hydrocodone for her chronic low back pain.  She is doing well overall she says the pain medication is effective in keeping her going.  She does walk about 3 days/week with her husband and that does help.  She has not had any recent flares or exacerbations.  He is due for urine drug screen and updated pain contract today.  She says she does take her medication most days but occasionally can go a whole day without it and does okay.  She and her husband have been trying to walk for exercise.  She feels like she is doing well in regards to her thyroid.  Past Medical History:  Diagnosis Date   Breast cancer ([place]) 1999   T1N0 LEFT BREAST   Cancer ([place]) 03-2006   METASTATIC BREAST TO APPARENT ISOLATED LEFT INTERNAL MAMMARY NODE   Degenerative disc disease    History of bladder infections    RECURRENT   Hyperlipidemia    Osteoporosis 03-04-2011    LAST BONE DENSITY 03-04-2011   Recurrent sinusitis    Thyroid disease    Trigeminal neuralgia     Past Surgical History:  Procedure Laterality Date   ABDOMINAL HYSTERECTOMY  2005   Complete   BLADDER SUSPENSION  2005   BREAST LUMPECTOMY Left 1999   malignant   BREAST SURGERY  1999   Lumpectomy L Br   BREAST SURGERY  2007   Lymph Node Removal L Chest Wall   cancerous LN removed  2008   left breast lumpectomy  1999    Family History  Problem Relation Age of Onset   Cancer Mother        breast   Stroke Mother    Hyperlipidemia Mother    Cancer Father        bladder   Colon cancer Father    Cancer Paternal Grandmother        Mouth   Cancer Son        Breast   Cancer Maternal Aunt        Breast   Stomach cancer Paternal Uncle    Stomach cancer  Paternal Uncle     Social History   Socioeconomic History   Marital status: Married    Spouse name: [name]    Number of children: 3   Years of education: RN   Highest education level: Not on file  Occupational History    Comment: Retired- Therapist, sports  Tobacco Use   Smoking status: Never   Smokeless tobacco: Never  Vaping Use   Vaping Use: Never used  Substance and Sexual Activity   Alcohol use: No    Alcohol/week: 0.0 standard drinks   Drug use: No   Sexual activity: Not on file    Comment: housewife, married, 3 kids.  Other Topics Concern   Not on file  Social History Narrative   Patient lives at home with her husband [name])   Retired - RN   Right handed.   Caffeine two cups coffee daily and one sweet tea.   Social Determinants of Health   Financial Resource Strain: Low Risk    Difficulty of Paying Living Expenses: Not hard at all  Food Insecurity: No Food Insecurity   Worried About Charity fundraiser in the Last Year: Never true   Ran Out of Food in the Last Year: Never true  Transportation Needs: No Transportation Needs   Lack of Transportation (Medical): No   Lack of Transportation (Non-Medical): No  Physical Activity: Insufficiently Active   Days of Exercise per Week: 3 days   Minutes of Exercise per Session: 30 min  Stress: No Stress Concern Present   Feeling of Stress : Not at all  Social Connections: Moderately Integrated   Frequency of Communication with Friends and Family: More than three times a week   Frequency of Social Gatherings with Friends and Family: More than three times a week   Attends Religious Services: More than 4 times per year   Active Member of [employer] or Organizations: No   Attends Archivist Meetings: Never   Marital Status: Married  Human resources officer Violence: Not At Risk   Fear of Current or Ex-Partner: No   Emotionally Abused: No   Physically Abused: No   Sexually Abused: No    Outpatient Medications Prior to Visit   Medication Sig Dispense Refill   azelastine (ASTELIN) 0.1 % nasal spray Place 2 sprays into both nostrils daily.     Calcium-Magnesium-Vitamin D (CALCIUM 1200+D3 PO) Take by mouth daily.      carbamazepine (TEGRETOL XR) 100 MG 12 hr tablet Take 100 mg by mouth 2 (two) times daily.     denosumab (PROLIA) 60 MG/ML SOLN injection Inject 60 mg into the skin every 6 (six) months. Administer in upper arm, thigh, or abdomen     dorzolamide-timolol (COSOPT) 22.3-6.8 MG/ML ophthalmic solution 1 drop 2 (two) times daily.     fish oil-omega-3 fatty acids 1000 MG capsule Take 2 g by mouth daily.     latanoprost (XALATAN) 0.005 % ophthalmic solution      levocetirizine (XYZAL) 5 MG tablet Take 5 mg by mouth daily.     levothyroxine (SYNTHROID) 50 MCG tablet TAKE 1 TABLET BY MOUTH EVERY DAY BEFORE BREAKFAST 90 tablet 1   omeprazole (PRILOSEC) 40 MG capsule TAKE 1 CAPSULE BY MOUTH EVERY DAY 90 capsule 3   pravastatin (PRAVACHOL) 40 MG tablet TAKE 1 TABLET DAILY 90 tablet 3   PREMARIN vaginal cream INERT 1 APPLICATORFUL VAGINALLY NIGHTLY 1 WEEK & THEN 3 NIGHTS A WEEK THEREAFTER     senna-docusate (SENOKOT-S) 8.6-50 MG tablet Take 2 tablets by mouth at bedtime.     tamoxifen (NOLVADEX) 20 MG tablet Take 1 tablet (20 mg total) by mouth daily. 90 tablet 3   triamterene-hydrochlorothiazide (MAXZIDE-25) 37.5-25 MG tablet TAKE 1 TABLET BY MOUTH EVERY DAY (Patient not taking: Reported on 05/12/2021) 90 tablet 3   trimethoprim (TRIMPEX) 100 MG tablet Take 100 mg by mouth daily.     fluticasone (FLONASE) 50 MCG/ACT nasal spray Place 1 spray into both nostrils daily.  1   HYDROcodone-acetaminophen (NORCO/VICODIN) 5-325 MG tablet TAKE 1 TABLET EVERY TWELVE HOURS AS NEEDED 60 tablet 0   SHINGRIX injection      No facility-administered medications prior to visit.    Allergies  Allergen Reactions   Simvastatin Other (See Comments)    Aches and memory loss   Azithromycin Hives   Doxycycline Itching   Other Rash    Trazodone And Nefazodone Other (See Comments)    Memory impairment and palpitations.    Ampicillin Rash   Cefadroxil Rash   Dicloxacillin Rash    ROS Review  of Systems    Objective:    Physical Exam Constitutional:      Appearance: Normal appearance. She is well-developed.  HENT:     Head: Normocephalic and atraumatic.  Cardiovascular:     Rate and Rhythm: Normal rate and regular rhythm.     Heart sounds: Normal heart sounds.  Pulmonary:     Effort: Pulmonary effort is normal.     Breath sounds: Normal breath sounds.  Skin:    General: Skin is warm and dry.  Neurological:     Mental Status: She is alert and oriented to person, place, and time.  Psychiatric:        Behavior: Behavior normal.   BP 135/79   Pulse (!) 110   Ht _0  (1.702 m)   Wt 139 lb (63 kg)   SpO2 99%   BMI 21.77 kg/m  Wt Readings from Last 3 Encounters:  06/22/21 139 lb (63 kg)  03/19/21 143 lb 1.9 oz (64.9 kg)  12/18/20 137 lb 1.9 oz (62.2 kg)     Health Maintenance Due  Topic Date Due   Hepatitis C Screening  Never done   DEXA SCAN  04/27/2019   COVID-19 Vaccine (3 - Mixed Product risk series) 11/25/2019    There are no preventive care reminders to display for this patient.  Lab Results  Component Value Date   TSH 3.19 03/19/2021   Lab Results  Component Value Date   WBC 3.9 04/04/2017   HGB 12.3 04/04/2017   HCT 37.2 04/04/2017   MCV 93.9 04/04/2017   PLT 207 04/04/2017   Lab Results  Component Value Date   NA 140 03/19/2021   K 4.3 03/19/2021   CHLORIDE 103 01/31/2017   CO2 29 03/19/2021   GLUCOSE 99 03/19/2021   BUN 21 03/19/2021   CREATININE 1.05 (H) 03/19/2021   BILITOT 0.4 09/10/2020   ALKPHOS 29 (L) 04/04/2017   AST 22 09/10/2020   ALT 12 09/10/2020   PROT 6.8 09/10/2020   ALBUMIN 4.8 04/04/2017   CALCIUM 9.6 03/19/2021   ANIONGAP 9 01/31/2017   EGFR 56 (L) 03/19/2021   Lab Results  Component Value Date   CHOL 243 (H) 09/10/2020   Lab Results   Component Value Date   HDL 71 09/10/2020   Lab Results  Component Value Date   LDLCALC 130 (H) 09/10/2020   Lab Results  Component Value Date   TRIG 276 (H) 09/10/2020   Lab Results  Component Value Date   CHOLHDL 3.4 09/10/2020   No results found for: HGBA1C    Assessment & Plan:   Problem List Items Addressed This Visit       Nervous and Auditory   NEURALGIA, TRIGEMINAL   Relevant Medications   HYDROcodone-acetaminophen (NORCO/VICODIN) 5-325 MG tablet     Musculoskeletal and Integument   Degenerative disc disease, lumbar   Relevant Medications   HYDROcodone-acetaminophen (NORCO/VICODIN) 5-325 MG tablet     Other   NECK PAIN   Relevant Medications   HYDROcodone-acetaminophen (NORCO/VICODIN) 5-325 MG tablet   Encounter for chronic pain management - Primary    Indication for chronic opioid: DDD Cervical and lumbar Medication and dose: Hydrocodone 5/325 # pills per month: 60    Last UDS date: 06/22/21 Opioid Treatment Agreement signed (Y/N): Y Opioid Treatment Agreement last reviewed with patient: 06/22/21 NCCSRS reviewed this encounter (include red flags):  Y  Pain contract updated today.  Urine drug screen collected.      Relevant Medications  HYDROcodone-acetaminophen (NORCO/VICODIN) 5-325 MG tablet    Meds ordered this encounter  Medications   HYDROcodone-acetaminophen (NORCO/VICODIN) 5-325 MG tablet    Sig: TAKE 1 TABLET EVERY TWELVE HOURS AS NEEDED    Dispense:  60 tablet    Refill:  0     Follow-up: Return in about 3 months (around 09/22/2021) for pain managment.    Beatrice Lecher, MD

## 2021-06-22 NOTE — Addendum Note (Signed)
Addended by: Teddy Spike on: 06/22/2021 04:52 PM   Modules accepted: Orders

## 2021-06-22 NOTE — Assessment & Plan Note (Addendum)
Indication for chronic opioid: DDD Cervical and lumbar Medication and dose: Hydrocodone 5/325 # pills per month: 60 Last UDS date:06/22/21 Opioid Treatment Agreement signed (Y/N): Y Opioid Treatment Agreement last reviewed with patient:06/22/21 NCCSRS reviewed this encounter (include red flags): Y  Pain contract updated today.  Urine drug screen collected.

## 2021-06-24 NOTE — Progress Notes (Signed)
Please call lab and see if we can add the more sensitive test for oxycodone and hydrocodone.

## 2021-06-25 ENCOUNTER — Other Ambulatory Visit: Payer: Self-pay | Admitting: Nurse Practitioner

## 2021-07-03 LAB — DRUG MONITORING ASSESS PANEL, EXTENDED, QUANTITATIVE, URINE
6 Acetylmorphine: NEGATIVE ng/mL (ref <10)
6 Beta Naltrexol: NEGATIVE ng/mL (ref <5)
Alphahydroxyalprazolam: NEGATIVE ng/mL (ref <25)
Alphahydroxymidazolam: NEGATIVE ng/mL (ref <50)
Alphahydroxytriazolam: NEGATIVE ng/mL (ref <50)
Aminoclonazepam: NEGATIVE ng/mL (ref <25)
Amitriptyline: NEGATIVE ng/mL (ref <100)
Amphetamine: NEGATIVE ng/mL (ref <250)
Benzoylecgonine: NEGATIVE ng/mL (ref <100)
Buprenorphine: NEGATIVE ng/mL (ref <2)
Codeine: NEGATIVE ng/mL (ref <50)
Creatinine: 83.9 mg/dL (ref >=20.0)
Desmethyltramadol: 580 ng/mL — ABNORMAL HIGH (ref <100)
EDDP: NEGATIVE ng/mL (ref <100)
Fentanyl: NEGATIVE ng/mL (ref <0.50)
Gabapentin: NEGATIVE ng/mL (ref <1000)
Hydrocodone: NEGATIVE ng/mL (ref <50)
Hydromorphone: NEGATIVE ng/mL (ref <50)
Hydroxyethylflurazepam: NEGATIVE ng/mL (ref <50)
Ketamine: NEGATIVE ng/mL (ref <50)
Lorazepam: NEGATIVE ng/mL (ref <50)
MDA: NEGATIVE ng/mL (ref <200)
MDMA: NEGATIVE ng/mL (ref <200)
Meprobamate: NEGATIVE ng/mL (ref <1000)
Methadone: NEGATIVE ng/mL (ref <100)
Methamphetamine: NEGATIVE ng/mL (ref <250)
Mitragynine: NEGATIVE ng/mL (ref <2)
Morphine: NEGATIVE ng/mL (ref <50)
Naloxone: NEGATIVE ng/mL (ref <2)
Naltrexone: NEGATIVE ng/mL (ref <5)
Norbuprenorphine: NEGATIVE ng/mL (ref <2)
Nordiazepam: NEGATIVE ng/mL (ref <50)
Norfentanyl: NEGATIVE ng/mL (ref <0.50)
Norhydrocodone: 54 ng/mL — ABNORMAL HIGH (ref <50)
Norketamine: NEGATIVE ng/mL (ref <50)
Noroxycodone: NEGATIVE ng/mL (ref <50)
Nortapentadol: NEGATIVE ng/mL (ref <50)
Nortriptyline: NEGATIVE ng/mL (ref <100)
Oxazepam: NEGATIVE ng/mL (ref <50)
Oxidant: NEGATIVE ug/mL (ref <200)
Oxycodone: NEGATIVE ng/mL (ref <50)
Oxymorphone: NEGATIVE ng/mL (ref <50)
Phencyclidine: NEGATIVE ng/mL (ref <25)
Pregabalin: NEGATIVE ng/mL (ref <1000)
Ritalinic Acid: NEGATIVE ng/mL (ref <100)
Tapentadol: NEGATIVE ng/mL (ref <50)
Temazepam: NEGATIVE ng/mL (ref <50)
Tramadol: 100 ng/mL — ABNORMAL HIGH (ref <100)
ZOLPIDEM METABOLITE: NEGATIVE ng/mL (ref <5)
ZOLPIDEM: NEGATIVE ng/mL (ref <5)
pH: 6 (ref 4.5–9.0)

## 2021-07-03 LAB — DRUG MONITORING, PANEL 6 WITH CONFIRMATION, URINE
6 Acetylmorphine: NEGATIVE ng/mL (ref <10)
Alcohol Metabolites: NEGATIVE ng/mL (ref <500)
Amphetamines: NEGATIVE ng/mL (ref <500)
Barbiturates: NEGATIVE ng/mL (ref <300)
Benzodiazepines: NEGATIVE ng/mL (ref <100)
Cocaine Metabolite: NEGATIVE ng/mL (ref <150)
Creatinine: 66.5 mg/dL (ref >=20.0)
Marijuana Metabolite: NEGATIVE ng/mL (ref <20)
Methadone Metabolite: NEGATIVE ng/mL (ref <100)
Opiates: NEGATIVE ng/mL (ref <100)
Oxidant: NEGATIVE ug/mL (ref <200)
Oxycodone: NEGATIVE ng/mL (ref <100)
Phencyclidine: NEGATIVE ng/mL (ref <25)
pH: 6.2 (ref 4.5–9.0)

## 2021-07-03 LAB — DM TEMPLATE

## 2021-07-07 NOTE — Progress Notes (Signed)
Please call patient and see if she is skipping her pain medicine before she comes in for her appointment every time we do her urine it does not actually show the medicine that she is taking and it should especially since she takes her medicine every day so I am concerned for why its not showing up.

## 2021-07-10 DIAGNOSIS — E785 Hyperlipidemia, unspecified: Secondary | ICD-10-CM | POA: Diagnosis not present

## 2021-07-10 DIAGNOSIS — K219 Gastro-esophageal reflux disease without esophagitis: Secondary | ICD-10-CM | POA: Diagnosis not present

## 2021-07-10 DIAGNOSIS — H547 Unspecified visual loss: Secondary | ICD-10-CM | POA: Diagnosis not present

## 2021-07-10 DIAGNOSIS — M1990 Unspecified osteoarthritis, unspecified site: Secondary | ICD-10-CM | POA: Diagnosis not present

## 2021-07-10 DIAGNOSIS — Z008 Encounter for other general examination: Secondary | ICD-10-CM | POA: Diagnosis not present

## 2021-07-10 DIAGNOSIS — Z803 Family history of malignant neoplasm of breast: Secondary | ICD-10-CM | POA: Diagnosis not present

## 2021-07-10 DIAGNOSIS — Z7722 Contact with and (suspected) exposure to environmental tobacco smoke (acute) (chronic): Secondary | ICD-10-CM | POA: Diagnosis not present

## 2021-07-10 DIAGNOSIS — G629 Polyneuropathy, unspecified: Secondary | ICD-10-CM | POA: Diagnosis not present

## 2021-07-10 DIAGNOSIS — Z79810 Long term (current) use of selective estrogen receptor modulators (SERMs): Secondary | ICD-10-CM | POA: Diagnosis not present

## 2021-07-10 DIAGNOSIS — H409 Unspecified glaucoma: Secondary | ICD-10-CM | POA: Diagnosis not present

## 2021-07-10 DIAGNOSIS — E039 Hypothyroidism, unspecified: Secondary | ICD-10-CM | POA: Diagnosis not present

## 2021-07-10 DIAGNOSIS — N390 Urinary tract infection, site not specified: Secondary | ICD-10-CM | POA: Diagnosis not present

## 2021-07-10 DIAGNOSIS — I10 Essential (primary) hypertension: Secondary | ICD-10-CM | POA: Diagnosis not present

## 2021-07-12 ENCOUNTER — Other Ambulatory Visit: Payer: Self-pay | Admitting: Family Medicine

## 2021-07-12 DIAGNOSIS — E782 Mixed hyperlipidemia: Secondary | ICD-10-CM

## 2021-07-26 ENCOUNTER — Other Ambulatory Visit: Payer: Self-pay | Admitting: Family Medicine

## 2021-07-26 DIAGNOSIS — G500 Trigeminal neuralgia: Secondary | ICD-10-CM

## 2021-07-26 DIAGNOSIS — M542 Cervicalgia: Secondary | ICD-10-CM

## 2021-07-26 DIAGNOSIS — M5136 Other intervertebral disc degeneration, lumbar region: Secondary | ICD-10-CM

## 2021-07-26 DIAGNOSIS — G8929 Other chronic pain: Secondary | ICD-10-CM

## 2021-07-26 MED ORDER — HYDROCODONE-ACETAMINOPHEN 5-325 MG PO TABS: ORAL_TABLET | ORAL | 0 refills | Status: DC

## 2021-07-26 NOTE — Progress Notes (Signed)
Meds ordered this encounter  Medications   HYDROcodone-acetaminophen (NORCO/VICODIN) 5-325 MG tablet    Sig: TAKE 1 TABLET EVERY TWELVE HOURS AS NEEDED    Dispense:  60 tablet    Refill:  0

## 2021-08-27 ENCOUNTER — Other Ambulatory Visit: Payer: Self-pay | Admitting: *Deleted

## 2021-08-27 DIAGNOSIS — G8929 Other chronic pain: Secondary | ICD-10-CM

## 2021-08-27 DIAGNOSIS — G500 Trigeminal neuralgia: Secondary | ICD-10-CM

## 2021-08-27 DIAGNOSIS — M542 Cervicalgia: Secondary | ICD-10-CM

## 2021-08-27 DIAGNOSIS — M5136 Other intervertebral disc degeneration, lumbar region: Secondary | ICD-10-CM

## 2021-08-27 MED ORDER — HYDROCODONE-ACETAMINOPHEN 5-325 MG PO TABS: ORAL_TABLET | ORAL | 0 refills | Status: DC

## 2021-08-27 NOTE — Telephone Encounter (Signed)
Office visit within 3 months On pain contract .Marland KitchenPDMP reviewed during this encounter. Sent refills.

## 2021-09-23 ENCOUNTER — Ambulatory Visit (INDEPENDENT_AMBULATORY_CARE_PROVIDER_SITE_OTHER): Payer: Medicare HMO | Admitting: Family Medicine

## 2021-09-23 ENCOUNTER — Other Ambulatory Visit: Payer: Self-pay

## 2021-09-23 VITALS — BP 153/87 | HR 115 | Resp 16 | Ht 67.0 in | Wt 144.0 lb

## 2021-09-23 DIAGNOSIS — G8929 Other chronic pain: Secondary | ICD-10-CM | POA: Diagnosis not present

## 2021-09-23 DIAGNOSIS — E039 Hypothyroidism, unspecified: Secondary | ICD-10-CM | POA: Diagnosis not present

## 2021-09-23 DIAGNOSIS — G500 Trigeminal neuralgia: Secondary | ICD-10-CM | POA: Diagnosis not present

## 2021-09-23 DIAGNOSIS — M542 Cervicalgia: Secondary | ICD-10-CM | POA: Diagnosis not present

## 2021-09-23 DIAGNOSIS — E782 Mixed hyperlipidemia: Secondary | ICD-10-CM | POA: Diagnosis not present

## 2021-09-23 DIAGNOSIS — M5136 Other intervertebral disc degeneration, lumbar region: Secondary | ICD-10-CM

## 2021-09-23 MED ORDER — HYDROCODONE-ACETAMINOPHEN 5-325 MG PO TABS: ORAL_TABLET | ORAL | 0 refills | Status: DC

## 2021-09-23 MED ORDER — HYDROCODONE-ACETAMINOPHEN 5-325 MG PO TABS: 1.0000 | ORAL_TABLET | Freq: Two times a day (BID) | ORAL | 0 refills | Status: DC | PRN

## 2021-09-23 NOTE — Assessment & Plan Note (Signed)
Indication for chronic opioid: DDD Cervical and lumbar Medication and dose: Hydrocodone 5/325 # pills per month: 60 Last UDS date:06/22/21 Opioid Treatment Agreement signed (Y/N): Y Opioid Treatment Agreement last reviewed with patient:06/22/21 NCCSRS reviewed this encounter (include red flags): [name]

## 2021-09-23 NOTE — Progress Notes (Signed)
Established Patient Office Visit  Subjective:  Patient ID: [name], female    DOB: Jan 10, 1946  Age: 76 y.o. MRN: [id number]  CC:  Chief Complaint  Patient presents with   Pain   Follow-up    HPI [name] presents for Follow-up chronic pain management-she is currently on hydrocodone for her chronic low back pain.  She is doing well overall she says the pain medication is effective in keeping her going.  She does walk about 3 days/week with her husband and that does help.  She has not had any recent flares or exacerbations.  He is due for urine drug screen and updated pain contract today.  She says she does take her medication most days but occasionally can go a whole day without it and does okay.  She and her husband have been trying to walk for exercise.  Hypothyroidism - Taking medication regularly in the AM away from food and vitamins, etc. No recent change to skin, hair, or energy levels.   Past Medical History:  Diagnosis Date   Breast cancer ([place]) 1999   T1N0 LEFT BREAST   Cancer ([place]) 03-2006   METASTATIC BREAST TO APPARENT ISOLATED LEFT INTERNAL MAMMARY NODE   Degenerative disc disease    History of bladder infections    RECURRENT   Hyperlipidemia    Osteoporosis 03-04-2011    LAST BONE DENSITY 03-04-2011   Recurrent sinusitis    Thyroid disease    Trigeminal neuralgia     Past Surgical History:  Procedure Laterality Date   ABDOMINAL HYSTERECTOMY  2005   Complete   BLADDER SUSPENSION  2005   BREAST LUMPECTOMY Left 1999   malignant   BREAST SURGERY  1999   Lumpectomy L Br   BREAST SURGERY  2007   Lymph Node Removal L Chest Wall   cancerous LN removed  2008   left breast lumpectomy  1999    Family History  Problem Relation Age of Onset   Cancer Mother        breast   Stroke Mother    Hyperlipidemia Mother    Cancer Father        bladder   Colon cancer Father    Cancer Paternal Grandmother        Mouth   Cancer Son        Breast   Cancer  Maternal Aunt        Breast   Stomach cancer Paternal Uncle    Stomach cancer Paternal Uncle     Social History   Socioeconomic History   Marital status: Married    Spouse name: [name]    Number of children: 3   Years of education: RN   Highest education level: Not on file  Occupational History    Comment: Retired- Therapist, sports  Tobacco Use   Smoking status: Never   Smokeless tobacco: Never  Vaping Use   Vaping Use: Never used  Substance and Sexual Activity   Alcohol use: No    Alcohol/week: 0.0 standard drinks   Drug use: No   Sexual activity: Not on file    Comment: housewife, married, 3 kids.  Other Topics Concern   Not on file  Social History Narrative   Patient lives at home with her husband [name])   Retired - RN   Right handed.   Caffeine two cups coffee daily and one sweet tea.   Social Determinants of Health   Financial Resource Strain: Low Risk  Difficulty of Paying Living Expenses: Not hard at all  Food Insecurity: No Food Insecurity   Worried About [place] in the Last Year: Never true   Ran Out of Food in the Last Year: Never true  Transportation Needs: No Transportation Needs   Lack of Transportation (Medical): No   Lack of Transportation (Non-Medical): No  Physical Activity: Insufficiently Active   Days of Exercise per Week: 3 days   Minutes of Exercise per Session: 30 min  Stress: No Stress Concern Present   Feeling of Stress : Not at all  Social Connections: Moderately Integrated   Frequency of Communication with Friends and Family: More than three times a week   Frequency of Social Gatherings with Friends and Family: More than three times a week   Attends Religious Services: More than 4 times per year   Active Member of [employer] or Organizations: No   Attends Archivist Meetings: Never   Marital Status: Married  Human resources officer Violence: Not At Risk   Fear of Current or Ex-Partner: No   Emotionally Abused: No   Physically Abused:  No   Sexually Abused: No    Outpatient Medications Prior to Visit  Medication Sig Dispense Refill   azelastine (ASTELIN) 0.1 % nasal spray Place 2 sprays into both nostrils daily.     Calcium-Magnesium-Vitamin D (CALCIUM 1200+D3 PO) Take by mouth daily.      carbamazepine (TEGRETOL XR) 100 MG 12 hr tablet Take 100 mg by mouth 2 (two) times daily.     denosumab (PROLIA) 60 MG/ML SOLN injection Inject 60 mg into the skin every 6 (six) months. Administer in upper arm, thigh, or abdomen     dorzolamide-timolol (COSOPT) 22.3-6.8 MG/ML ophthalmic solution 1 drop 2 (two) times daily.     fish oil-omega-3 fatty acids 1000 MG capsule Take 2 g by mouth daily.     latanoprost (XALATAN) 0.005 % ophthalmic solution      levocetirizine (XYZAL) 5 MG tablet Take 5 mg by mouth daily.     levothyroxine (SYNTHROID) 50 MCG tablet TAKE 1 TABLET BY MOUTH EVERY DAY BEFORE BREAKFAST 90 tablet 1   omeprazole (PRILOSEC) 40 MG capsule TAKE 1 CAPSULE BY MOUTH EVERY DAY 90 capsule 3   pravastatin (PRAVACHOL) 40 MG tablet TAKE 1 TABLET DAILY 90 tablet 3   PREMARIN vaginal cream INERT 1 APPLICATORFUL VAGINALLY NIGHTLY 1 WEEK & THEN 3 NIGHTS A WEEK THEREAFTER     senna-docusate (SENOKOT-S) 8.6-50 MG tablet Take 2 tablets by mouth at bedtime.     tamoxifen (NOLVADEX) 20 MG tablet Take 1 tablet (20 mg total) by mouth daily. 90 tablet 3   trimethoprim (TRIMPEX) 100 MG tablet Take 100 mg by mouth daily.     HYDROcodone-acetaminophen (NORCO/VICODIN) 5-325 MG tablet TAKE 1 TABLET EVERY TWELVE HOURS AS NEEDED 60 tablet 0   triamterene-hydrochlorothiazide (MAXZIDE-25) 37.5-25 MG tablet TAKE 1 TABLET BY MOUTH EVERY DAY 90 tablet 3   No facility-administered medications prior to visit.    Allergies  Allergen Reactions   Simvastatin Other (See Comments)    Aches and memory loss   Azithromycin Hives   Doxycycline Itching   Other Rash   Trazodone And Nefazodone Other (See Comments)    Memory impairment and palpitations.     Ampicillin Rash   Cefadroxil Rash   Dicloxacillin Rash    ROS Review of Systems    Objective:    Physical Exam Constitutional:      Appearance: Normal appearance.  She is well-developed.  HENT:     Head: Normocephalic and atraumatic.  Cardiovascular:     Rate and Rhythm: Normal rate and regular rhythm.     Heart sounds: Normal heart sounds.  Pulmonary:     Effort: Pulmonary effort is normal.     Breath sounds: Normal breath sounds.  Skin:    General: Skin is warm and dry.  Neurological:     Mental Status: She is alert and oriented to person, place, and time.  Psychiatric:        Behavior: Behavior normal.    BP 140/81    Pulse (!) 109    Resp 16    Ht '5\' 7"'  (1.702 m)    Wt 144 lb (65.3 kg)    SpO2 98%    BMI 22.55 kg/m  Wt Readings from Last 3 Encounters:  09/23/21 144 lb (65.3 kg)  06/22/21 139 lb (63 kg)  03/19/21 143 lb 1.9 oz (64.9 kg)     Health Maintenance Due  Topic Date Due   Hepatitis C Screening  Never done    There are no preventive care reminders to display for this patient.  Lab Results  Component Value Date   TSH 3.19 03/19/2021   Lab Results  Component Value Date   WBC 3.9 04/04/2017   HGB 12.3 04/04/2017   HCT 37.2 04/04/2017   MCV 93.9 04/04/2017   PLT 207 04/04/2017   Lab Results  Component Value Date   NA 140 03/19/2021   K 4.3 03/19/2021   CHLORIDE 103 01/31/2017   CO2 29 03/19/2021   GLUCOSE 99 03/19/2021   BUN 21 03/19/2021   CREATININE 1.05 (H) 03/19/2021   BILITOT 0.4 09/10/2020   ALKPHOS 29 (L) 04/04/2017   AST 22 09/10/2020   ALT 12 09/10/2020   PROT 6.8 09/10/2020   ALBUMIN 4.8 04/04/2017   CALCIUM 9.6 03/19/2021   ANIONGAP 9 01/31/2017   EGFR 56 (L) 03/19/2021   Lab Results  Component Value Date   CHOL 243 (H) 09/10/2020   Lab Results  Component Value Date   HDL 71 09/10/2020   Lab Results  Component Value Date   LDLCALC 130 (H) 09/10/2020   Lab Results  Component Value Date   TRIG 276 (H) 09/10/2020    Lab Results  Component Value Date   CHOLHDL 3.4 09/10/2020   No results found for: HGBA1C    Assessment & Plan:   Problem List Items Addressed This Visit       Endocrine   Hypothyroidism    Stable on current regimen. Due to recheck TSH.       Relevant Orders   Lipid Panel w/reflex Direct LDL   COMPLETE METABOLIC PANEL WITH GFR   CBC   TSH     Nervous and Auditory   NEURALGIA, TRIGEMINAL   Relevant Medications   HYDROcodone-acetaminophen (NORCO/VICODIN) 5-325 MG tablet (Start on 09/26/2021)   Other Relevant Orders   Lipid Panel w/reflex Direct LDL   COMPLETE METABOLIC PANEL WITH GFR   CBC   TSH     Musculoskeletal and Integument   Degenerative disc disease, lumbar   Relevant Medications   HYDROcodone-acetaminophen (NORCO/VICODIN) 5-325 MG tablet (Start on 09/26/2021)   HYDROcodone-acetaminophen (NORCO/VICODIN) 5-325 MG tablet (Start on 10/26/2021)   HYDROcodone-acetaminophen (NORCO/VICODIN) 5-325 MG tablet (Start on 11/22/2021)     Other   NECK PAIN   Relevant Medications   HYDROcodone-acetaminophen (NORCO/VICODIN) 5-325 MG tablet (Start on 09/26/2021)   HYPERLIPIDEMIA, MIXED  Relevant Orders   Lipid Panel w/reflex Direct LDL   COMPLETE METABOLIC PANEL WITH GFR   CBC   TSH   Encounter for chronic pain management - Primary    Indication for chronic opioid: DDD Cervical and lumbar Medication and dose: Hydrocodone 5/325 # pills per month: 60    Last UDS date: 06/22/21 Opioid Treatment Agreement signed (Y/N): Y Opioid Treatment Agreement last reviewed with patient: 06/22/21 NCCSRS reviewed this encounter (include red flags):  Y        Relevant Medications   HYDROcodone-acetaminophen (NORCO/VICODIN) 5-325 MG tablet (Start on 09/26/2021)   HYDROcodone-acetaminophen (NORCO/VICODIN) 5-325 MG tablet (Start on 10/26/2021)   HYDROcodone-acetaminophen (NORCO/VICODIN) 5-325 MG tablet (Start on 11/22/2021)    Meds ordered this encounter  Medications    HYDROcodone-acetaminophen (NORCO/VICODIN) 5-325 MG tablet    Sig: TAKE 1 TABLET EVERY TWELVE HOURS AS NEEDED    Dispense:  60 tablet    Refill:  0   HYDROcodone-acetaminophen (NORCO/VICODIN) 5-325 MG tablet    Sig: Take 1 tablet by mouth every 12 (twelve) hours as needed for moderate pain.    Dispense:  60 tablet    Refill:  0   HYDROcodone-acetaminophen (NORCO/VICODIN) 5-325 MG tablet    Sig: Take 1 tablet by mouth every 12 (twelve) hours as needed for moderate pain.    Dispense:  60 tablet    Refill:  0    Follow-up: Return in about 3 months (around 12/22/2021) for Chronic Pain Management .    Beatrice Lecher, MD

## 2021-09-23 NOTE — Assessment & Plan Note (Signed)
Stable on current regimen. Due to recheck TSH.

## 2021-09-29 ENCOUNTER — Other Ambulatory Visit: Payer: Self-pay

## 2021-09-29 DIAGNOSIS — M542 Cervicalgia: Secondary | ICD-10-CM

## 2021-09-29 DIAGNOSIS — G8929 Other chronic pain: Secondary | ICD-10-CM

## 2021-09-29 DIAGNOSIS — M5136 Other intervertebral disc degeneration, lumbar region: Secondary | ICD-10-CM

## 2021-09-29 DIAGNOSIS — G500 Trigeminal neuralgia: Secondary | ICD-10-CM

## 2021-09-29 MED ORDER — HYDROCODONE-ACETAMINOPHEN 5-325 MG PO TABS: ORAL_TABLET | ORAL | 0 refills | Status: DC

## 2021-09-29 MED ORDER — HYDROCODONE-ACETAMINOPHEN 5-325 MG PO TABS: 1.0000 | ORAL_TABLET | Freq: Two times a day (BID) | ORAL | 0 refills | Status: DC | PRN

## 2021-09-29 NOTE — Telephone Encounter (Signed)
Patient called stating [facility] informed her the Hydrocodone is on back order and is requesting to have the prescription sent to [facility] in [place]. I called [facility] and canceled the Hydrocodone prescriptions. Forward to Dr. Madilyn Fireman.

## 2021-10-07 ENCOUNTER — Ambulatory Visit: Payer: Medicare HMO

## 2021-10-10 ENCOUNTER — Other Ambulatory Visit: Payer: Self-pay | Admitting: Family Medicine

## 2021-10-27 DIAGNOSIS — Z170 Estrogen receptor positive status [ER+]: Secondary | ICD-10-CM | POA: Diagnosis not present

## 2021-10-27 DIAGNOSIS — C50512 Malignant neoplasm of lower-outer quadrant of left female breast: Secondary | ICD-10-CM | POA: Diagnosis not present

## 2021-10-27 DIAGNOSIS — M818 Other osteoporosis without current pathological fracture: Secondary | ICD-10-CM | POA: Diagnosis not present

## 2021-10-27 DIAGNOSIS — C771 Secondary and unspecified malignant neoplasm of intrathoracic lymph nodes: Secondary | ICD-10-CM | POA: Diagnosis not present

## 2021-10-27 DIAGNOSIS — Z842 Family history of other diseases of the genitourinary system: Secondary | ICD-10-CM | POA: Diagnosis not present

## 2021-10-27 DIAGNOSIS — Z5181 Encounter for therapeutic drug level monitoring: Secondary | ICD-10-CM | POA: Diagnosis not present

## 2021-10-27 DIAGNOSIS — T386X5A Adverse effect of antigonadotrophins, antiestrogens, antiandrogens, not elsewhere classified, initial encounter: Secondary | ICD-10-CM | POA: Diagnosis not present

## 2021-11-26 ENCOUNTER — Other Ambulatory Visit: Payer: Self-pay | Admitting: Internal Medicine

## 2021-11-26 DIAGNOSIS — Z1231 Encounter for screening mammogram for malignant neoplasm of breast: Secondary | ICD-10-CM

## 2021-11-29 ENCOUNTER — Other Ambulatory Visit: Payer: Self-pay

## 2021-11-29 DIAGNOSIS — M542 Cervicalgia: Secondary | ICD-10-CM

## 2021-11-29 DIAGNOSIS — G8929 Other chronic pain: Secondary | ICD-10-CM

## 2021-11-29 DIAGNOSIS — M5136 Other intervertebral disc degeneration, lumbar region: Secondary | ICD-10-CM

## 2021-11-29 DIAGNOSIS — G500 Trigeminal neuralgia: Secondary | ICD-10-CM

## 2021-12-07 ENCOUNTER — Ambulatory Visit
Admission: RE | Admit: 2021-12-07 | Discharge: 2021-12-07 | Disposition: A | Discharge status: Home / Self Care | Payer: Medicare HMO | Source: Ambulatory Visit | Attending: Internal Medicine | Admitting: Internal Medicine

## 2021-12-07 DIAGNOSIS — Z1231 Encounter for screening mammogram for malignant neoplasm of breast: Secondary | ICD-10-CM

## 2021-12-07 HISTORY — DX: Personal history of antineoplastic chemotherapy: Z92.21

## 2021-12-21 DIAGNOSIS — N3020 Other chronic cystitis without hematuria: Secondary | ICD-10-CM | POA: Diagnosis not present

## 2021-12-23 ENCOUNTER — Encounter: Payer: Self-pay | Admitting: Family Medicine

## 2021-12-23 ENCOUNTER — Ambulatory Visit (INDEPENDENT_AMBULATORY_CARE_PROVIDER_SITE_OTHER): Payer: Medicare HMO | Admitting: Family Medicine

## 2021-12-23 VITALS — BP 143/79 | HR 88 | Ht 67.0 in | Wt 145.0 lb

## 2021-12-23 DIAGNOSIS — M542 Cervicalgia: Secondary | ICD-10-CM

## 2021-12-23 DIAGNOSIS — M5136 Other intervertebral disc degeneration, lumbar region: Secondary | ICD-10-CM | POA: Diagnosis not present

## 2021-12-23 DIAGNOSIS — G3184 Mild cognitive impairment, so stated: Secondary | ICD-10-CM

## 2021-12-23 DIAGNOSIS — G500 Trigeminal neuralgia: Secondary | ICD-10-CM

## 2021-12-23 DIAGNOSIS — G8929 Other chronic pain: Secondary | ICD-10-CM | POA: Diagnosis not present

## 2021-12-23 MED ORDER — HYDROCODONE-ACETAMINOPHEN 5-325 MG PO TABS: 1.0000 | ORAL_TABLET | Freq: Two times a day (BID) | ORAL | 0 refills | Status: DC | PRN

## 2021-12-23 MED ORDER — HYDROCODONE-ACETAMINOPHEN 5-325 MG PO TABS: ORAL_TABLET | ORAL | 0 refills | Status: DC

## 2021-12-23 NOTE — Progress Notes (Signed)
? ?Established Patient Office Visit ? ?Subjective   ?Patient ID: DAMICA GRAVLIN, female    DOB: 1946-07-07  Age: 76 y.o. MRN: [id number] ? ?Chief Complaint  ?Patient presents with  ? Pain Management  ? ? ?HPI ? ?3 mo f/u for chronic pain management -she feels like overall she is doing well on her medication regimen for chronic low back pain she takes about 60 tabs per month sometimes it lasts her an extra day or 2 occasionally she will skip a tab.  She says she has been walking with her husband.  She denies any constipation.  She does feel like the medication is helpful and effective. ? ?Also here today for follow-up for her memory.  She no longer drives.  She says she does still help with the finances.  She feels like she sleeps well.  I asked her if she was noticing any specific changes with her memory and she said not really. ? ? ? ?ROS ? ?  ?Objective:  ?  ? ?BP (!) 143/79   Pulse 88   Ht '5\' 7"'$  (1.702 m)   Wt 145 lb (65.8 kg)   SpO2 98%   BMI 22.71 kg/m?  ? ? ?Physical Exam ?Vitals and nursing note reviewed.  ?Constitutional:   ?   Appearance: She is well-developed.  ?HENT:  ?   Head: Normocephalic and atraumatic.  ?Cardiovascular:  ?   Rate and Rhythm: Normal rate and regular rhythm.  ?   Heart sounds: Normal heart sounds.  ?Pulmonary:  ?   Effort: Pulmonary effort is normal.  ?   Breath sounds: Normal breath sounds.  ?Skin: ?   General: Skin is warm and dry.  ?Neurological:  ?   Mental Status: She is alert and oriented to person, place, and time.  ?Psychiatric:     ?   Behavior: Behavior normal.  ? ? ? ?No results found for any visits on 12/23/21. ? ? ? ?The 10-year ASCVD risk score (Arnett DK, et al., 2019) is: 26.1% ? ?  ?Assessment & Plan:  ? ?Problem List Items Addressed This Visit   ? ?  ? Nervous and Auditory  ? NEURALGIA, TRIGEMINAL  ? Relevant Medications  ? HYDROcodone-acetaminophen (NORCO/VICODIN) 5-325 MG tablet  ? Mild cognitive impairment  ?  MMSE: 12/23/21 score of 20 out of 30.  Her last MMSE  score was back in 2021 and she scored a 26 out of 30.  She has had some progression of her memory impairment.  We can start discussing the possibility of something like Aricept to help maintain memory function and I would prefer to have the discussion with her and her husband he sometimes comes with her with her appointments but did not today. ? ? ?  ?  ?  ? Musculoskeletal and Integument  ? Degenerative disc disease, lumbar  ? Relevant Medications  ? HYDROcodone-acetaminophen (NORCO/VICODIN) 5-325 MG tablet (Start on 02/21/2022)  ? HYDROcodone-acetaminophen (NORCO/VICODIN) 5-325 MG tablet (Start on 01/21/2022)  ? HYDROcodone-acetaminophen (NORCO/VICODIN) 5-325 MG tablet  ?  ? Other  ? NECK PAIN  ? Relevant Medications  ? HYDROcodone-acetaminophen (NORCO/VICODIN) 5-325 MG tablet  ? Encounter for chronic pain management - Primary  ?  Indication for chronic opioid: DDD Cervical and lumbar ?Medication and dose: Hydrocodone 5/325 ?# pills per month: 60    ?Last UDS date: 06/22/21 ?Opioid Treatment Agreement signed (Y/N): Y ?Opioid Treatment Agreement last reviewed with patient: 06/22/21 ?Friona reviewed this encounter (include red flags):  Y ? ?  ?  ?  Relevant Medications  ? HYDROcodone-acetaminophen (NORCO/VICODIN) 5-325 MG tablet (Start on 02/21/2022)  ? HYDROcodone-acetaminophen (NORCO/VICODIN) 5-325 MG tablet (Start on 01/21/2022)  ? HYDROcodone-acetaminophen (NORCO/VICODIN) 5-325 MG tablet  ? Other Relevant Orders  ? DRUG MONITORING, PANEL 6 WITH CONFIRMATION, URINE  ? ? ?Due for a follow-up in 3 months, will be due for blood work at that point in time including his thyroid ? ?Return in about 3 months (around 03/24/2022) for Follow-up pain management and lab work..  ? ? ?Beatrice Lecher, MD ? ?

## 2021-12-23 NOTE — Patient Instructions (Signed)
Please get your second shingles vaccine if you have not already at the pharmacy.  If you did already receive it then please let us know and we can update your chart. ? ?Please get a Tdap at [facility] as well.  It has been 10 years since her last one and it is free with Medicare at the pharmacy. ?

## 2021-12-23 NOTE — Assessment & Plan Note (Addendum)
MMSE: 12/23/21 score of 20 out of 30.  Her last MMSE score was back in 2021 and she scored a 26 out of 30.  She has had some progression of her memory impairment.  We can start discussing the possibility of something like Aricept to help maintain memory function and I would prefer to have the discussion with her and her husband he sometimes comes with her with her appointments but did not today. ? ?

## 2021-12-23 NOTE — Assessment & Plan Note (Signed)
Indication for chronic opioid: DDD Cervical and lumbar ?Medication and dose: Hydrocodone 5/325 ?# pills per month: 60??? ?Last UDS date:?06/22/21 ?Opioid Treatment Agreement signed (Y/N): Y ?Opioid Treatment Agreement last reviewed with patient:?06/22/21 ?NCCSRS reviewed this encounter (include red flags): ?Y ?

## 2021-12-25 LAB — DRUG MONITORING, PANEL 6 WITH CONFIRMATION, URINE
6 Acetylmorphine: NEGATIVE ng/mL (ref <10)
Alcohol Metabolites: NEGATIVE ng/mL (ref <500)
Amphetamines: NEGATIVE ng/mL (ref <500)
Barbiturates: NEGATIVE ng/mL (ref <300)
Benzodiazepines: NEGATIVE ng/mL (ref <100)
Cocaine Metabolite: NEGATIVE ng/mL (ref <150)
Codeine: NEGATIVE ng/mL (ref <50)
Creatinine: 53.2 mg/dL (ref >=20.0)
Hydrocodone: 145 ng/mL — ABNORMAL HIGH (ref <50)
Hydromorphone: 122 ng/mL — ABNORMAL HIGH (ref <50)
Marijuana Metabolite: NEGATIVE ng/mL (ref <20)
Methadone Metabolite: NEGATIVE ng/mL (ref <100)
Morphine: NEGATIVE ng/mL (ref <50)
Norhydrocodone: 775 ng/mL — ABNORMAL HIGH (ref <50)
Opiates: POSITIVE ng/mL — AB (ref <100)
Oxidant: NEGATIVE ug/mL (ref <200)
Oxycodone: NEGATIVE ng/mL (ref <100)
Phencyclidine: NEGATIVE ng/mL (ref <25)
pH: 7 (ref 4.5–9.0)

## 2021-12-25 LAB — DM TEMPLATE

## 2021-12-28 DIAGNOSIS — C44321 Squamous cell carcinoma of skin of nose: Secondary | ICD-10-CM | POA: Diagnosis not present

## 2021-12-28 DIAGNOSIS — R229 Localized swelling, mass and lump, unspecified: Secondary | ICD-10-CM | POA: Diagnosis not present

## 2021-12-28 DIAGNOSIS — L814 Other melanin hyperpigmentation: Secondary | ICD-10-CM | POA: Diagnosis not present

## 2021-12-28 DIAGNOSIS — Z85828 Personal history of other malignant neoplasm of skin: Secondary | ICD-10-CM | POA: Diagnosis not present

## 2021-12-28 DIAGNOSIS — D229 Melanocytic nevi, unspecified: Secondary | ICD-10-CM | POA: Diagnosis not present

## 2021-12-28 DIAGNOSIS — Z08 Encounter for follow-up examination after completed treatment for malignant neoplasm: Secondary | ICD-10-CM | POA: Diagnosis not present

## 2021-12-28 DIAGNOSIS — L821 Other seborrheic keratosis: Secondary | ICD-10-CM | POA: Diagnosis not present

## 2021-12-30 ENCOUNTER — Other Ambulatory Visit: Payer: Self-pay | Admitting: *Deleted

## 2021-12-30 DIAGNOSIS — G8929 Other chronic pain: Secondary | ICD-10-CM

## 2021-12-30 DIAGNOSIS — M5136 Other intervertebral disc degeneration, lumbar region: Secondary | ICD-10-CM

## 2021-12-30 DIAGNOSIS — G500 Trigeminal neuralgia: Secondary | ICD-10-CM

## 2021-12-30 DIAGNOSIS — M542 Cervicalgia: Secondary | ICD-10-CM

## 2021-12-30 MED ORDER — HYDROCODONE-ACETAMINOPHEN 5-325 MG PO TABS: 1.0000 | ORAL_TABLET | Freq: Two times a day (BID) | ORAL | 0 refills | Status: DC | PRN

## 2021-12-30 MED ORDER — HYDROCODONE-ACETAMINOPHEN 5-325 MG PO TABS: ORAL_TABLET | ORAL | 0 refills | Status: DC

## 2021-12-30 NOTE — Telephone Encounter (Signed)
Meds ordered this encounter  ?Medications  ? HYDROcodone-acetaminophen (NORCO/VICODIN) 5-325 MG tablet  ?  Sig: Take 1 tablet by mouth every 12 (twelve) hours as needed for moderate pain.  ?  Dispense:  60 tablet  ?  Refill:  0  ? HYDROcodone-acetaminophen (NORCO/VICODIN) 5-325 MG tablet  ?  Sig: Take 1 tablet by mouth every 12 (twelve) hours as needed for moderate pain.  ?  Dispense:  60 tablet  ?  Refill:  0  ? HYDROcodone-acetaminophen (NORCO/VICODIN) 5-325 MG tablet  ?  Sig: TAKE 1 TABLET EVERY TWELVE HOURS AS NEEDED  ?  Dispense:  60 tablet  ?  Refill:  0  ? ? ?

## 2021-12-30 NOTE — Telephone Encounter (Signed)
Pt's husband called and LVM stating that she needed her pain medication refilled. He said that she needs this to be sent to [facility]. ? ?Called [facility] spoke w/Anthony and asked that he cancel the 3 prescriptions they have on file for her. ? ?Will reorder and route to pcp for signature.  ?

## 2022-01-21 DIAGNOSIS — B0222 Postherpetic trigeminal neuralgia: Secondary | ICD-10-CM | POA: Diagnosis not present

## 2022-01-21 DIAGNOSIS — K5900 Constipation, unspecified: Secondary | ICD-10-CM | POA: Diagnosis not present

## 2022-01-21 DIAGNOSIS — K219 Gastro-esophageal reflux disease without esophagitis: Secondary | ICD-10-CM | POA: Diagnosis not present

## 2022-01-21 DIAGNOSIS — M545 Low back pain, unspecified: Secondary | ICD-10-CM | POA: Diagnosis not present

## 2022-01-21 DIAGNOSIS — H409 Unspecified glaucoma: Secondary | ICD-10-CM | POA: Diagnosis not present

## 2022-01-21 DIAGNOSIS — E785 Hyperlipidemia, unspecified: Secondary | ICD-10-CM | POA: Diagnosis not present

## 2022-01-21 DIAGNOSIS — G3184 Mild cognitive impairment, so stated: Secondary | ICD-10-CM | POA: Diagnosis not present

## 2022-01-21 DIAGNOSIS — E039 Hypothyroidism, unspecified: Secondary | ICD-10-CM | POA: Diagnosis not present

## 2022-01-21 DIAGNOSIS — N390 Urinary tract infection, site not specified: Secondary | ICD-10-CM | POA: Diagnosis not present

## 2022-01-21 DIAGNOSIS — C50919 Malignant neoplasm of unspecified site of unspecified female breast: Secondary | ICD-10-CM | POA: Diagnosis not present

## 2022-01-21 DIAGNOSIS — M1990 Unspecified osteoarthritis, unspecified site: Secondary | ICD-10-CM | POA: Diagnosis not present

## 2022-01-21 DIAGNOSIS — I10 Essential (primary) hypertension: Secondary | ICD-10-CM | POA: Diagnosis not present

## 2022-02-01 ENCOUNTER — Telehealth: Payer: Self-pay | Admitting: *Deleted

## 2022-02-01 NOTE — Telephone Encounter (Signed)
Pt's husband asked for a refill of her pain medication. I informed him that this was already at the pharmacy all he needed to do was ask them to fill this.

## 2022-03-17 DIAGNOSIS — N393 Stress incontinence (female) (male): Secondary | ICD-10-CM | POA: Diagnosis not present

## 2022-03-17 DIAGNOSIS — R350 Frequency of micturition: Secondary | ICD-10-CM | POA: Diagnosis not present

## 2022-03-17 DIAGNOSIS — N3020 Other chronic cystitis without hematuria: Secondary | ICD-10-CM | POA: Diagnosis not present

## 2022-03-24 ENCOUNTER — Encounter: Payer: Self-pay | Admitting: Family Medicine

## 2022-03-24 ENCOUNTER — Ambulatory Visit (INDEPENDENT_AMBULATORY_CARE_PROVIDER_SITE_OTHER): Payer: Medicare HMO | Admitting: Family Medicine

## 2022-03-24 VITALS — BP 134/84 | HR 107 | Ht 67.0 in | Wt 146.0 lb

## 2022-03-24 DIAGNOSIS — G8929 Other chronic pain: Secondary | ICD-10-CM

## 2022-03-24 DIAGNOSIS — E782 Mixed hyperlipidemia: Secondary | ICD-10-CM

## 2022-03-24 DIAGNOSIS — E039 Hypothyroidism, unspecified: Secondary | ICD-10-CM | POA: Diagnosis not present

## 2022-03-24 DIAGNOSIS — G500 Trigeminal neuralgia: Secondary | ICD-10-CM

## 2022-03-24 DIAGNOSIS — M5136 Other intervertebral disc degeneration, lumbar region: Secondary | ICD-10-CM

## 2022-03-24 DIAGNOSIS — Z23 Encounter for immunization: Secondary | ICD-10-CM

## 2022-03-24 DIAGNOSIS — M542 Cervicalgia: Secondary | ICD-10-CM | POA: Diagnosis not present

## 2022-03-24 DIAGNOSIS — G3184 Mild cognitive impairment, so stated: Secondary | ICD-10-CM

## 2022-03-24 MED ORDER — HYDROCODONE-ACETAMINOPHEN 5-325 MG PO TABS: 1.0000 | ORAL_TABLET | Freq: Two times a day (BID) | ORAL | 0 refills | Status: DC | PRN

## 2022-03-24 MED ORDER — HYDROCODONE-ACETAMINOPHEN 5-325 MG PO TABS
1.0000 | ORAL_TABLET | Freq: Two times a day (BID) | ORAL | 0 refills | Status: DC | PRN
Start: 2022-04-27 — End: 2022-06-24

## 2022-03-24 MED ORDER — HYDROCODONE-ACETAMINOPHEN 5-325 MG PO TABS: ORAL_TABLET | ORAL | 0 refills | Status: DC

## 2022-03-24 NOTE — Patient Instructions (Signed)
Please get your Tdap updated at your pharmacy.

## 2022-03-24 NOTE — Addendum Note (Signed)
Addended by: Teddy Spike on: 03/24/2022 05:32 PM   Modules accepted: Orders

## 2022-03-24 NOTE — Progress Notes (Addendum)
Established Patient Office Visit  Subjective   Patient ID: [name], female    DOB: 1946/08/13  Age: 76 y.o. MRN: [id number]  Chief Complaint  Patient presents with   Pain Management    HPI 3 mo f/u for chronic pain management -she feels like overall she is doing well on her medication regimen for chronic low back pain she takes about 60 tabs per month sometimes it lasts her an extra day or 2 occasionally she will skip a tab.  She says she has been walking with her husband.  She denies any constipation.  She does feel like the medication is helpful and effective.  Hypothyroidism - Taking medication regularly in the AM away from food and vitamins, etc. No recent change to skin, hair, or energy levels.     ROS    Objective:     BP 134/84   Pulse (!) 107   Ht '5\' 7"'$  (1.702 m)   Wt 146 lb (66.2 kg)   SpO2 98%   BMI 22.87 kg/m    Physical Exam Vitals and nursing note reviewed.  Constitutional:      Appearance: She is well-developed.  HENT:     Head: Normocephalic and atraumatic.  Cardiovascular:     Rate and Rhythm: Normal rate and regular rhythm.     Heart sounds: Normal heart sounds.  Pulmonary:     Effort: Pulmonary effort is normal.     Breath sounds: Normal breath sounds.  Skin:    General: Skin is warm and dry.  Neurological:     Mental Status: She is alert and oriented to person, place, and time.  Psychiatric:        Behavior: Behavior normal.      No results found for any visits on 03/24/22.    The 10-year ASCVD risk score (Arnett DK, et al., 2019) is: 23.3%    Assessment & Plan:   Problem List Items Addressed This Visit     Degenerative disc disease, lumbar   Relevant Medications   HYDROcodone-acetaminophen (NORCO/VICODIN) 5-325 MG tablet (Start on 05/27/2022)   HYDROcodone-acetaminophen (NORCO/VICODIN) 5-325 MG tablet (Start on 04/27/2022)   HYDROcodone-acetaminophen (NORCO/VICODIN) 5-325 MG tablet   Encounter for chronic pain management     Indication for chronic opioid: DDD Cervical and lumbar Medication and dose: Hydrocodone 5/325 # pills per month: 60    Last UDS date: 12/23/21/22 Opioid Treatment Agreement signed (Y/N): Y Opioid Treatment Agreement last reviewed with patient: 06/22/21 NCCSRS reviewed this encounter (include red flags):  No, not working.       Relevant Medications   HYDROcodone-acetaminophen (NORCO/VICODIN) 5-325 MG tablet (Start on 05/27/2022)   HYDROcodone-acetaminophen (NORCO/VICODIN) 5-325 MG tablet (Start on 04/27/2022)   HYDROcodone-acetaminophen (NORCO/VICODIN) 5-325 MG tablet   HYPERLIPIDEMIA, MIXED    Compliant but uncontrolled on Pravachol. Waiting to see if she is okay with [place] switching to rosuvastatin, rechecking in 2 to 3 months and then potentially adding fenofibrate if triglycerides is still high.      Hypothyroidism    To recheck thyroid level today.  She feels asymptomatic.  Update: TSH elevated, increasing to 75 mcg levothyroxine with a 6-week TSH recheck      Relevant Medications   levothyroxine (SYNTHROID) 75 MCG tablet   Mild cognitive impairment - Primary    We have previously discussed the possibility of starting Aricept.  She wanted to think about it I asked her again about it today and she said she would rather hold off and  just monitor.  We will plan to recheck again when I see her back in 3 months.  Like for her to go ahead and go for her labs she is overdue.      NECK PAIN   Relevant Medications   HYDROcodone-acetaminophen (NORCO/VICODIN) 5-325 MG tablet (Start on 05/27/2022)   HYDROcodone-acetaminophen (NORCO/VICODIN) 5-325 MG tablet (Start on 04/27/2022)   HYDROcodone-acetaminophen (NORCO/VICODIN) 5-325 MG tablet   NEURALGIA, TRIGEMINAL   Relevant Medications   HYDROcodone-acetaminophen (NORCO/VICODIN) 5-325 MG tablet (Start on 05/27/2022)   HYDROcodone-acetaminophen (NORCO/VICODIN) 5-325 MG tablet (Start on 04/27/2022)   HYDROcodone-acetaminophen (NORCO/VICODIN)  5-325 MG tablet   Other Visit Diagnoses     Need for pneumococcal 20-valent conjugate vaccination       Relevant Orders   Pneumococcal conjugate vaccine 20-valent (Prevnar 20) (Completed)       Prevnar 20 given today.   Discussed getting Tdap updated at the pharmacy.   Return in about 3 months (around 06/24/2022) for Pain Management .

## 2022-03-24 NOTE — Assessment & Plan Note (Signed)
We have previously discussed the possibility of starting Aricept.  She wanted to think about it I asked her again about it today and she said she would rather hold off and just monitor.  We will plan to recheck again when I see her back in 3 months.  Like for her to go ahead and go for her labs she is overdue.

## 2022-03-24 NOTE — Assessment & Plan Note (Addendum)
To recheck thyroid level today.  She feels asymptomatic.  Update: TSH elevated, increasing to 75 mcg levothyroxine with a 6-week TSH recheck

## 2022-03-24 NOTE — Assessment & Plan Note (Signed)
Indication for chronic opioid: DDD Cervical and lumbar Medication and dose: Hydrocodone 5/325 # pills per month: 60 Last UDS date:12/23/21/22 Opioid Treatment Agreement signed (Y/N): Y Opioid Treatment Agreement last reviewed with patient:06/22/21 NCCSRS reviewed this encounter (include red flags): No, not working.

## 2022-03-25 LAB — COMPLETE METABOLIC PANEL WITH GFR
AG Ratio: 2.1 (calc) (ref 1.0–2.5)
ALT: 14 U/L (ref 6–29)
AST: 21 U/L (ref 10–35)
Albumin: 4.5 g/dL (ref 3.6–5.1)
Alkaline phosphatase (APISO): 48 U/L (ref 37–153)
BUN: 16 mg/dL (ref 7–25)
CO2: 24 mmol/L (ref 20–32)
Calcium: 9.1 mg/dL (ref 8.6–10.4)
Chloride: 100 mmol/L (ref 98–110)
Creat: 0.9 mg/dL (ref 0.60–1.00)
Globulin: 2.1 g/dL (calc) (ref 1.9–3.7)
Glucose, Bld: 211 mg/dL — ABNORMAL HIGH (ref 65–99)
Potassium: 4.2 mmol/L (ref 3.5–5.3)
Sodium: 137 mmol/L (ref 135–146)
Total Bilirubin: 0.3 mg/dL (ref 0.2–1.2)
Total Protein: 6.6 g/dL (ref 6.1–8.1)
eGFR: 67 mL/min/{1.73_m2} (ref >=60)

## 2022-03-25 LAB — CBC
HCT: 41.1 % (ref 35.0–45.0)
Hemoglobin: 13.7 g/dL (ref 11.7–15.5)
MCH: 31.6 pg (ref 27.0–33.0)
MCHC: 33.3 g/dL (ref 32.0–36.0)
MCV: 94.9 fL (ref 80.0–100.0)
MPV: 10.4 fL (ref 7.5–12.5)
Platelets: 224 10*3/uL (ref 140–400)
RBC: 4.33 10*6/uL (ref 3.80–5.10)
RDW: 12.4 % (ref 11.0–15.0)
WBC: 4.6 10*3/uL (ref 3.8–10.8)

## 2022-03-25 LAB — LIPID PANEL W/REFLEX DIRECT LDL
Cholesterol: 245 mg/dL — ABNORMAL HIGH (ref <200)
HDL: 65 mg/dL (ref >=50)
LDL Cholesterol (Calc): 132 mg/dL (calc) — ABNORMAL HIGH
Non-HDL Cholesterol (Calc): 180 mg/dL (calc) — ABNORMAL HIGH (ref <130)
Total CHOL/HDL Ratio: 3.8 (calc) (ref <5.0)
Triglycerides: 337 mg/dL — ABNORMAL HIGH (ref <150)

## 2022-03-25 LAB — TSH: TSH: 5.31 mIU/L — ABNORMAL HIGH (ref 0.40–4.50)

## 2022-03-25 NOTE — Progress Notes (Signed)
Hi Poet, total cholesterol and LDL are mildly elevated similar to what its been over the last couple years but the triglyceride shot up dramatically.  Are you taking your pravastatin?  Please verify and let us know.  If you are not then please restart.  If you are taking it then we may need to look at adding something for the triglycerides particularly.  The metabolic panel looks good.  Blood count is normal.  Thyroid is also off.  Please verify how you are taking your thyroid pill, as I will need to make an adjustment.

## 2022-03-29 ENCOUNTER — Other Ambulatory Visit: Payer: Self-pay | Admitting: *Deleted

## 2022-03-29 DIAGNOSIS — E039 Hypothyroidism, unspecified: Secondary | ICD-10-CM

## 2022-03-29 DIAGNOSIS — E785 Hyperlipidemia, unspecified: Secondary | ICD-10-CM

## 2022-03-29 MED ORDER — LEVOTHYROXINE SODIUM 75 MCG PO TABS: 75.0000 ug | ORAL_TABLET | Freq: Every day | ORAL | 3 refills | Status: DC

## 2022-03-29 MED ORDER — ROSUVASTATIN CALCIUM 20 MG PO TABS: 20.0000 mg | ORAL_TABLET | Freq: Every day | ORAL | 3 refills | Status: DC

## 2022-03-29 NOTE — Assessment & Plan Note (Addendum)
Compliant but uncontrolled on Pravachol. Waiting to see if she is okay with [place] switching to rosuvastatin, rechecking in 2 to 3 months and then potentially adding fenofibrate if triglycerides is still high.  Update: Patient okay with switching to rosuvastatin, we will start with 20 mg daily with a 2 to 28-monthrecheck and then potentially add fenofibrate if triglycerides still high.

## 2022-03-29 NOTE — Addendum Note (Signed)
Addended by: Silverio Decamp on: 03/29/2022 11:07 AM   Modules accepted: Orders

## 2022-03-29 NOTE — Addendum Note (Signed)
Addended by: Silverio Decamp on: 03/29/2022 04:47 PM   Modules accepted: Orders

## 2022-04-29 DIAGNOSIS — C771 Secondary and unspecified malignant neoplasm of intrathoracic lymph nodes: Secondary | ICD-10-CM | POA: Diagnosis not present

## 2022-04-29 DIAGNOSIS — Z170 Estrogen receptor positive status [ER+]: Secondary | ICD-10-CM | POA: Diagnosis not present

## 2022-04-29 DIAGNOSIS — M818 Other osteoporosis without current pathological fracture: Secondary | ICD-10-CM | POA: Diagnosis not present

## 2022-04-29 DIAGNOSIS — C50512 Malignant neoplasm of lower-outer quadrant of left female breast: Secondary | ICD-10-CM | POA: Diagnosis not present

## 2022-04-29 DIAGNOSIS — Z5181 Encounter for therapeutic drug level monitoring: Secondary | ICD-10-CM | POA: Diagnosis not present

## 2022-04-29 DIAGNOSIS — T386X5A Adverse effect of antigonadotrophins, antiestrogens, antiandrogens, not elsewhere classified, initial encounter: Secondary | ICD-10-CM | POA: Diagnosis not present

## 2022-04-29 DIAGNOSIS — Z842 Family history of other diseases of the genitourinary system: Secondary | ICD-10-CM | POA: Diagnosis not present

## 2022-05-12 DIAGNOSIS — Z760 Encounter for issue of repeat prescription: Secondary | ICD-10-CM | POA: Diagnosis not present

## 2022-05-12 DIAGNOSIS — G500 Trigeminal neuralgia: Secondary | ICD-10-CM | POA: Diagnosis not present

## 2022-05-12 DIAGNOSIS — G501 Atypical facial pain: Secondary | ICD-10-CM | POA: Diagnosis not present

## 2022-05-16 ENCOUNTER — Telehealth: Payer: Self-pay | Admitting: General Practice

## 2022-05-16 NOTE — Telephone Encounter (Signed)
error 

## 2022-05-17 ENCOUNTER — Other Ambulatory Visit: Payer: Self-pay | Admitting: Family Medicine

## 2022-05-17 DIAGNOSIS — M5136 Other intervertebral disc degeneration, lumbar region: Secondary | ICD-10-CM

## 2022-05-17 DIAGNOSIS — G500 Trigeminal neuralgia: Secondary | ICD-10-CM

## 2022-05-17 DIAGNOSIS — M542 Cervicalgia: Secondary | ICD-10-CM

## 2022-05-17 DIAGNOSIS — G8929 Other chronic pain: Secondary | ICD-10-CM

## 2022-05-17 MED ORDER — HYDROCODONE-ACETAMINOPHEN 5-325 MG PO TABS: 1.0000 | ORAL_TABLET | Freq: Two times a day (BID) | ORAL | 0 refills | Status: DC | PRN

## 2022-05-17 NOTE — Telephone Encounter (Signed)
Pt's regular pharmacy is out of the Hydrocodone and he called [facility] in [place] and they have it there if we can call it in there for her to get. Please let pt know. Thank you

## 2022-05-17 NOTE — Telephone Encounter (Signed)
Pended pharmacy and prescription.

## 2022-05-18 ENCOUNTER — Other Ambulatory Visit: Payer: Self-pay | Admitting: *Deleted

## 2022-05-18 DIAGNOSIS — M542 Cervicalgia: Secondary | ICD-10-CM

## 2022-05-18 DIAGNOSIS — G500 Trigeminal neuralgia: Secondary | ICD-10-CM

## 2022-05-18 DIAGNOSIS — M5136 Other intervertebral disc degeneration, lumbar region: Secondary | ICD-10-CM

## 2022-05-18 DIAGNOSIS — G8929 Other chronic pain: Secondary | ICD-10-CM

## 2022-05-18 NOTE — Telephone Encounter (Signed)
Pt unable to get Hydrocodone from [facility] due to it being on back order for the past 2 weeks. Her husband has called around to see where they could get this from and [facility] on [address] does have this in stock and he is asking that it get sent there.

## 2022-05-18 NOTE — Telephone Encounter (Signed)
Sent yesterday to [facility] in a separate message.

## 2022-06-10 ENCOUNTER — Ambulatory Visit (INDEPENDENT_AMBULATORY_CARE_PROVIDER_SITE_OTHER): Payer: Medicare HMO | Admitting: Family Medicine

## 2022-06-10 DIAGNOSIS — Z0000 Encounter for general adult medical examination without abnormal findings: Secondary | ICD-10-CM

## 2022-06-10 NOTE — Progress Notes (Signed)
MEDICARE ANNUAL WELLNESS VISIT  06/10/2022  Telephone Visit Disclaimer This Medicare AWV was conducted by telephone due to national recommendations for restrictions regarding the COVID-19 Pandemic (e.g. social distancing).  I verified, using two identifiers, that I am speaking with [name] or their authorized healthcare agent. I discussed the limitations, risks, security, and privacy concerns of performing an evaluation and management service by telephone and the potential availability of an in-person appointment in the future. The patient expressed understanding and agreed to proceed.  Location of Patient: Home Location of Provider (nurse):  In the office.  Subjective:    [name] is a 76 y.o. female patient of Metheney, Rene Kocher, MD who had a Medicare Annual Wellness Visit today via telephone. [name] is Retired and lives with their spouse. she has 3 children. she reports that she is socially active and does interact with friends/family regularly. she is moderately physically active and enjoys going for walks.  Patient Care Team: Hali Marry, MD as PCP - General Gordy Levan, MD as Consulting Physician (Hematology and Oncology) Leta Baptist, MD as Consulting Physician (Otolaryngology) Bjorn Loser, MD as Consulting Physician (Urology)     06/10/2022   11:03 AM 05/12/2021    5:07 PM 01/31/2017    1:06 PM 08/01/2016   10:46 AM 02/01/2016   12:56 PM 08/03/2015    1:47 PM 03/18/2015    9:16 AM  Advanced Directives  Does Patient Have a Medical Advance Directive? No Yes No No No No   Type of Advance Directive  [place] in Chart?  No - copy requested       Would patient like information on creating a medical advance directive? No - Patient declined    Yes - Scientist, clinical (histocompatibility and immunogenetics) given No - patient declined information No - patient declined information    Hospital Utilization Over the Past 12  Months: # of hospitalizations or ER visits: 0 # of surgeries: 0  Review of Systems    Patient reports that her overall health is unchanged compared to last year.  History obtained from chart review and the patient  Patient Reported Readings (BP, Pulse, CBG, Weight, etc) none  Pain Assessment Pain : No/denies pain     Current Medications & Allergies (verified) Allergies as of 06/10/2022       Reactions   Simvastatin Other (See Comments)   Aches and memory loss   Azithromycin Hives   Doxycycline Itching   Other Rash   Trazodone And Nefazodone Other (See Comments)   Memory impairment and palpitations.    Ampicillin Rash   Cefadroxil Rash   Dicloxacillin Rash        Medication List        Accurate as of June 10, 2022 11:17 AM. If you have any questions, ask your nurse or doctor.          azelastine 0.1 % nasal spray Commonly known as: ASTELIN Place 2 sprays into both nostrils daily.   CALCIUM 1200+D3 PO Take by mouth daily.   carbamazepine 100 MG 12 hr tablet Commonly known as: TEGRETOL XR Take 100 mg by mouth 2 (two) times daily.   denosumab 60 MG/ML Soln injection Commonly known as: PROLIA Inject 60 mg into the skin every 6 (six) months. Administer in upper arm, thigh, or abdomen   dorzolamide-timolol 2-0.5 % ophthalmic solution Commonly known as: COSOPT 1 drop 2 (two)  times daily.   fish oil-omega-3 fatty acids 1000 MG capsule Take 2 g by mouth daily.   HYDROcodone-acetaminophen 5-325 MG tablet Commonly known as: NORCO/VICODIN TAKE 1 TABLET EVERY TWELVE HOURS AS NEEDEDTAKE 1 TABLET EVERY TWELVE HOURS AS NEEDED   HYDROcodone-acetaminophen 5-325 MG tablet Commonly known as: NORCO/VICODIN Take 1 tablet by mouth every 12 (twelve) hours as needed for moderate pain.   HYDROcodone-acetaminophen 5-325 MG tablet Commonly known as: NORCO/VICODIN Take 1 tablet by mouth every 12 (twelve) hours as needed for moderate pain.   latanoprost 0.005 %  ophthalmic solution Commonly known as: XALATAN   levocetirizine 5 MG tablet Commonly known as: XYZAL Take 5 mg by mouth daily.   levothyroxine 75 MCG tablet Commonly known as: SYNTHROID Take 1 tablet (75 mcg total) by mouth daily before breakfast.   omeprazole 40 MG capsule Commonly known as: PRILOSEC TAKE 1 CAPSULE BY MOUTH EVERY DAY   Premarin vaginal cream Generic drug: conjugated estrogens INERT 1 APPLICATORFUL VAGINALLY NIGHTLY 1 WEEK & THEN 3 NIGHTS A WEEK THEREAFTER   rosuvastatin 20 MG tablet Commonly known as: CRESTOR Take 1 tablet (20 mg total) by mouth daily.   senna-docusate 8.6-50 MG tablet Commonly known as: Senokot-S Take 2 tablets by mouth at bedtime.   tamoxifen 20 MG tablet Commonly known as: NOLVADEX Take 1 tablet (20 mg total) by mouth daily.   trimethoprim 100 MG tablet Commonly known as: TRIMPEX Take 100 mg by mouth daily.        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   Degenerative disc disease    History of bladder infections    RECURRENT   Hyperlipidemia    Osteoporosis 03/04/2011    LAST BONE DENSITY 03-04-2011   Personal history of chemotherapy    Recurrent sinusitis    Thyroid disease    Trigeminal neuralgia    Past Surgical History:  Procedure Laterality Date   ABDOMINAL HYSTERECTOMY  2005   Complete   BLADDER SUSPENSION  2005   BREAST LUMPECTOMY Left 1999   malignant   BREAST SURGERY  1999   Lumpectomy L Br   BREAST SURGERY  2007   Lymph Node Removal L Chest Wall   cancerous LN removed  2008   left breast lumpectomy  1999   Family History  Problem Relation Age of Onset   Cancer Mother        breast   Breast cancer Mother    Stroke Mother    Hyperlipidemia Mother    Cancer Father        bladder   Colon cancer Father    Breast cancer Maternal Aunt     Cancer Maternal Aunt        Breast   Stomach cancer Paternal Uncle    Stomach cancer Paternal Uncle    Cancer Paternal Grandmother        Mouth   Social History   Socioeconomic History   Marital status: Married    Spouse name: [name]    Number of children: 3   Years of education: RN   Highest education level: Not on file  Occupational History    Comment: Retired- Therapist, sports  Tobacco Use   Smoking status: Never   Smokeless tobacco: Never  Vaping Use   Vaping Use: Never used  Substance and Sexual Activity   Alcohol  use: No    Alcohol/week: 0.0 standard drinks of alcohol   Drug use: No   Sexual activity: Not on file  Other Topics Concern   Not on file  Social History Narrative   Patient lives at home with her husband [name])   Retired - RN   Right handed.   Caffeine two cups coffee daily and one sweet tea.   Social Determinants of Health   Financial Resource Strain: Low Risk  (06/10/2022)   Overall Financial Resource Strain (CARDIA)    Difficulty of Paying Living Expenses: Not hard at all  Food Insecurity: No Food Insecurity (06/10/2022)   Hunger Vital Sign    Worried About Running Out of Food in the Last Year: Never true    Ran Out of Food in the Last Year: Never true  Transportation Needs: No Transportation Needs (06/10/2022)   PRAPARE - Hydrologist (Medical): No    Lack of Transportation (Non-Medical): No  Physical Activity: Insufficiently Active (06/10/2022)   Exercise Vital Sign    Days of Exercise per Week: 4 days    Minutes of Exercise per Session: 30 min  Stress: No Stress Concern Present (06/10/2022)   [place]    Feeling of Stress : Not at all  Social Connections: Moderately Integrated (06/10/2022)   Social Connection and Isolation Panel [NHANES]    Frequency of Communication with Friends and Family: More than three times a week    Frequency of Social Gatherings  with Friends and Family: More than three times a week    Attends Religious Services: More than 4 times per year    Active Member of [employer] or Organizations: No    Attends Archivist Meetings: Never    Marital Status: Married    Activities of Daily Living    06/10/2022   11:06 AM  In your present state of health, do you have any difficulty performing the following activities:  Hearing? 0  Vision? 0  Difficulty concentrating or making decisions? 0  Walking or climbing stairs? 0  Dressing or bathing? 0  Doing errands, shopping? 1  Comment does not drive.  Preparing Food and eating ? N  Using the Toilet? N  In the past six months, have you accidently leaked urine? N  Do you have problems with loss of bowel control? N  Managing your Medications? N  Managing your Finances? N  Housekeeping or managing your Housekeeping? N    Patient Education/ Literacy How often do you need to have someone help you when you read instructions, pamphlets, or other written materials from your doctor or pharmacy?: 1 - Never What is the last grade level you completed in school?: nursing school  Exercise Current Exercise Habits: Home exercise routine, Type of exercise: walking, Time (Minutes): 30, Frequency (Times/Week): 4, Weekly Exercise (Minutes/Week): 120, Intensity: Moderate, Exercise limited by: None identified  Diet Patient reports consuming 2 meals a day and 0 snack(s) a day Patient reports that her primary diet is: Regular Patient reports that she does have regular access to food.   Depression Screen    06/10/2022   11:03 AM 03/24/2022    1:27 PM 05/12/2021    5:06 PM 03/19/2021    3:01 PM 09/10/2020    2:20 PM 07/10/2019   10:38 AM 04/09/2019   11:01 AM  PHQ 2/9 Scores  PHQ - 2 Score 0 0 0 0 0 0 0  Fall Risk    06/10/2022   11:03 AM 03/24/2022    1:27 PM 09/23/2021    2:26 PM 05/12/2021    5:10 PM 03/19/2021    3:01 PM  [place] in the past year? 0 0 0 0 0   Number falls in past yr: 0 0 0 0 0  Injury with Fall? 0 0 0 0   Risk for fall due to : No Fall Risks No Fall Risks No Fall Risks Impaired vision   Follow up Falls evaluation completed Falls prevention discussed Falls prevention discussed;Falls evaluation completed Falls prevention discussed      Objective:  [name] seemed alert and oriented and she participated appropriately during our telephone visit.  Blood Pressure Weight BMI  BP Readings from Last 3 Encounters:  03/24/22 134/84  12/23/21 (!) 143/79  09/23/21 (!) 153/87   Wt Readings from Last 3 Encounters:  03/24/22 146 lb (66.2 kg)  12/23/21 145 lb (65.8 kg)  09/23/21 144 lb (65.3 kg)   BMI Readings from Last 1 Encounters:  03/24/22 22.87 kg/m    *Unable to obtain current vital signs, weight, and BMI due to telephone visit type  Hearing/Vision  [name] did not seem to have difficulty with hearing/understanding during the telephone conversation Reports that she has had a formal eye exam by an eye care professional within the past year Reports that she has not had a formal hearing evaluation within the past year *Unable to fully assess hearing and vision during telephone visit type  Cognitive Function:    06/10/2022   11:09 AM 05/12/2021    5:12 PM 04/04/2017    1:00 PM  6CIT Screen  What Year? 4 points 0 points 0 points  What month? 0 points 3 points 0 points  What time? 0 points 0 points 0 points  Count back from 20 0 points 0 points 0 points  Months in reverse 0 points 0 points 2 points  Repeat phrase 4 points 10 points 8 points  Total Score 8 points 13 points 10 points   (Normal:0-7, Significant for Dysfunction: >8)  Normal Cognitive Function Screening: Yes   Immunization & Health Maintenance Record Immunization History  Administered Date(s) Administered   Fluad Quad(high Dose 65+) 04/29/2019, 06/08/2020   Influenza Whole 04/21/2011   Influenza, High Dose Seasonal PF 06/07/2018, 05/17/2021    Influenza,inj,Quad PF,6+ Mos 05/27/2013, 04/21/2014, 05/25/2015, 05/25/2017   Influenza-Unspecified 05/16/2016   Moderna Sars-Covid-2 Vaccination 10/07/2019, 10/28/2019   PNEUMOCOCCAL CONJUGATE-20 03/24/2022   Pfizer Covid-19 Vaccine Bivalent Booster 19yr & up 06/02/2021   Pneumococcal Conjugate-13 11/13/2013   Pneumococcal Polysaccharide-23 01/28/2015   Tdap 09/13/2011, 04/29/2022   Zoster Recombinat (Shingrix) 03/19/2021, 05/12/2021    Health Maintenance  Topic Date Due   DEXA SCAN  09/23/2022 (Originally 04/27/2019)   INFLUENZA VACCINE  11/27/2022 (Originally 03/29/2022)   Hepatitis C Screening  03/25/2023 (Originally 10/21/1963)   COVID-19 Vaccine (4 - Mixed Product risk series) 04/10/2023 (Originally 07/28/2021)   Fecal DNA (Cologuard)  03/28/2023   TETANUS/TDAP  04/29/2032   Pneumonia Vaccine 75 Years old  Completed   Zoster Vaccines- Shingrix  Completed   HPV VACCINES  Aged Out   COLONOSCOPY (Pts 45-430yrInsurance coverage will need to be confirmed)  Discontinued       Assessment  This is a routine wellness examination for [employer] Health Maintenance: Due or Overdue There are no preventive care reminders to display for this patient.   [name]  does not need a referral for Community Assistance: Care Management:   no Social Work:    no Prescription Assistance:  no Nutrition/Diabetes Education:  no   Plan:  Personalized Goals  Goals Addressed               This Visit's Progress     Patient Stated (pt-stated)        06/10/2022 AWV Goal: Exercise for General Health  Patient will verbalize understanding of the benefits of increased physical activity: Exercising regularly is important. It will improve your overall fitness, flexibility, and endurance. Regular exercise also will improve your overall health. It can help you control your weight, reduce stress, and improve your bone density. Over the next year, patient will increase physical activity as  tolerated with a goal of at least 150 minutes of moderate physical activity per week.  You can tell that you are exercising at a moderate intensity if your heart starts beating faster and you start breathing faster but can still hold a conversation. Moderate-intensity exercise ideas include: Walking 1 mile (1.6 km) in about 15 minutes Biking Hiking Golfing Dancing Water aerobics Patient will verbalize understanding of everyday activities that increase physical activity by providing examples like the following: Yard work, such as: Sales promotion account executive Gardening Washing windows or floors Patient will be able to explain general safety guidelines for exercising:  Before you start a new exercise program, talk with your health care provider. Do not exercise so much that you hurt yourself, feel dizzy, or get very short of breath. Wear comfortable clothes and wear shoes with good support. Drink plenty of water while you exercise to prevent dehydration or heat stroke. Work out until your breathing and your heartbeat get faster.        Personalized Health Maintenance & Screening Recommendations  Influenza vaccine Td vaccine Bone density scan - declined  Lung Cancer Screening Recommended: no (Low Dose CT Chest recommended if Age 95-80 years, 30 pack-year currently smoking OR have quit w/in past 15 years) Hepatitis C Screening recommended: no HIV Screening recommended: no  Advanced Directives: Written information was not prepared per patient's request.  Referrals & Orders No orders of the defined types were placed in this encounter.   Follow-up Plan Follow-up with Hali Marry, MD as planned Medicare wellness visit in one year.  AVS printed and mailed.   I have personally reviewed and noted the following in the patient's chart:   Medical and social history Use of alcohol, tobacco or illicit  drugs  Current medications and supplements Functional ability and status Nutritional status Physical activity Advanced directives List of other physicians Hospitalizations, surgeries, and ER visits in previous 12 months Vitals Screenings to include cognitive, depression, and falls Referrals and appointments  In addition, I have reviewed and discussed with [name] certain preventive protocols, quality metrics, and best practice recommendations. A written personalized care plan for preventive services as well as general preventive health recommendations is available and can be mailed to the patient at her request.      Tinnie Gens, RN BSN   06/10/2022

## 2022-06-10 NOTE — Patient Instructions (Addendum)
[place] Maintenance Summary and Written Plan of Care  Ms. [name] ,  Thank you for allowing me to perform your Medicare Annual Wellness Visit and for your ongoing commitment to your health.   Health Maintenance & Immunization History Health Maintenance  Topic Date Due  . DEXA SCAN  09/23/2022 (Originally 04/27/2019)  . INFLUENZA VACCINE  11/27/2022 (Originally 03/29/2022)  . Hepatitis C Screening  03/25/2023 (Originally 10/21/1963)  . COVID-19 Vaccine (4 - Mixed Product risk series) 04/10/2023 (Originally 07/28/2021)  . Fecal DNA (Cologuard)  03/28/2023  . TETANUS/TDAP  04/29/2032  . Pneumonia Vaccine 86+ Years old  Completed  . Zoster Vaccines- Shingrix  Completed  . HPV VACCINES  Aged Out  . COLONOSCOPY (Pts 45-77yr Insurance coverage will need to be confirmed)  Discontinued   Immunization History  Administered Date(s) Administered  . Fluad Quad(high Dose 65+) 04/29/2019, 06/08/2020  . Influenza Whole 04/21/2011  . Influenza, High Dose Seasonal PF 06/07/2018, 05/17/2021  . Influenza,inj,Quad PF,6+ Mos 05/27/2013, 04/21/2014, 05/25/2015, 05/25/2017  . Influenza-Unspecified 05/16/2016  . Moderna Sars-Covid-2 Vaccination 10/07/2019, 10/28/2019  . PNEUMOCOCCAL CONJUGATE-20 03/24/2022  . PPension scheme manager177yr& up 06/02/2021  . Pneumococcal Conjugate-13 11/13/2013  . Pneumococcal Polysaccharide-23 01/28/2015  . Tdap 09/13/2011, 04/29/2022  . Zoster Recombinat (Shingrix) 03/19/2021, 05/12/2021    These are the patient goals that we discussed:  Goals Addressed              This Visit's Progress   .  Patient Stated (pt-stated)        06/10/2022 AWV Goal: Exercise for General Health  Patient will verbalize understanding of the benefits of increased physical activity: Exercising regularly is important. It will improve your overall fitness, flexibility, and endurance. Regular exercise also will improve your overall health.  It can help you control your weight, reduce stress, and improve your bone density. Over the next year, patient will increase physical activity as tolerated with a goal of at least 150 minutes of moderate physical activity per week.  You can tell that you are exercising at a moderate intensity if your heart starts beating faster and you start breathing faster but can still hold a conversation. Moderate-intensity exercise ideas include: Walking 1 mile (1.6 km) in about 15 minutes Biking Hiking Golfing Dancing Water aerobics Patient will verbalize understanding of everyday activities that increase physical activity by providing examples like the following: Yard work, such as: PuSales promotion account executiveardening Washing windows or floors Patient will be able to explain general safety guidelines for exercising:  Before you start a new exercise program, talk with your health care provider. Do not exercise so much that you hurt yourself, feel dizzy, or get very short of breath. Wear comfortable clothes and wear shoes with good support. Drink plenty of water while you exercise to prevent dehydration or heat stroke. Work out until your breathing and your heartbeat get faster.         This is a list of Health Maintenance Items that are overdue or due now: Influenza vaccine Td vaccine Bone density scan - declined  Orders/Referrals Placed Today: No orders of the defined types were placed in this encounter.  (Contact our referral department at [phone] you have not spoken with someone about your referral appointment within the next 5 days)    Follow-up Plan Follow-up with MeHali MarryMD as planned Medicare wellness visit in one  year.  AVS printed and mailed.      Health Maintenance, Female Adopting a healthy lifestyle and getting preventive care are important in promoting health and wellness.  Ask your health care provider about: The right schedule for you to have regular tests and exams. Things you can do on your own to prevent diseases and keep yourself healthy. What should I know about diet, weight, and exercise? Eat a healthy diet  Eat a diet that includes plenty of vegetables, fruits, low-fat dairy products, and lean protein. Do not eat a lot of foods that are high in solid fats, added sugars, or sodium. Maintain a healthy weight Body mass index (BMI) is used to identify weight problems. It estimates body fat based on height and weight. Your health care provider can help determine your BMI and help you achieve or maintain a healthy weight. Get regular exercise Get regular exercise. This is one of the most important things you can do for your health. Most adults should: Exercise for at least 150 minutes each week. The exercise should increase your heart rate and make you sweat (moderate-intensity exercise). Do strengthening exercises at least twice a week. This is in addition to the moderate-intensity exercise. Spend less time sitting. Even light physical activity can be beneficial. Watch cholesterol and blood lipids Have your blood tested for lipids and cholesterol at 75 years of age, then have this test every 5 years. Have your cholesterol levels checked more often if: Your lipid or cholesterol levels are high. You are older than 75 years of age. You are at high risk for heart disease. What should I know about cancer screening? Depending on your health history and family history, you may need to have cancer screening at various ages. This may include screening for: Breast cancer. Cervical cancer. Colorectal cancer. Skin cancer. Lung cancer. What should I know about heart disease, diabetes, and high blood pressure? Blood pressure and heart disease High blood pressure causes heart disease and increases the risk of stroke. This is more likely to develop in people who have  high blood pressure readings or are overweight. Have your blood pressure checked: Every 3-5 years if you are 38-47 years of age. Every year if you are 89 years old or older. Diabetes Have regular diabetes screenings. This checks your fasting blood sugar level. Have the screening done: Once every three years after age 25 if you are at a normal weight and have a low risk for diabetes. More often and at a younger age if you are overweight or have a high risk for diabetes. What should I know about preventing infection? Hepatitis B If you have a higher risk for hepatitis B, you should be screened for this virus. Talk with your health care provider to find out if you are at risk for hepatitis B infection. Hepatitis C Testing is recommended for: Everyone born from [birth year] through 1964. Anyone with known risk factors for hepatitis C. Sexually transmitted infections (STIs) Get screened for STIs, including gonorrhea and chlamydia, if: You are sexually active and are younger than 75 years of age. You are older than 75 years of age and your health care provider tells you that you are at risk for this type of infection. Your sexual activity has changed since you were last screened, and you are at increased risk for chlamydia or gonorrhea. Ask your health care provider if you are at risk. Ask your health care provider about whether you are at high risk for HIV. Your  health care provider may recommend a prescription medicine to help prevent HIV infection. If you choose to take medicine to prevent HIV, you should first get tested for HIV. You should then be tested every 3 months for as long as you are taking the medicine. Pregnancy If you are about to stop having your period (premenopausal) and you may become pregnant, seek counseling before you get pregnant. Take 400 to 800 micrograms (mcg) of folic acid every day if you become pregnant. Ask for birth control (contraception) if you want to prevent  pregnancy. Osteoporosis and menopause Osteoporosis is a disease in which the bones lose minerals and strength with aging. This can result in bone fractures. If you are 23 years old or older, or if you are at risk for osteoporosis and fractures, ask your health care provider if you should: Be screened for bone loss. Take a calcium or vitamin D supplement to lower your risk of fractures. Be given hormone replacement therapy (HRT) to treat symptoms of menopause. Follow these instructions at home: Alcohol use Do not drink alcohol if: Your health care provider tells you not to drink. You are pregnant, may be pregnant, or are planning to become pregnant. If you drink alcohol: Limit how much you have to: 0-1 drink a day. Know how much alcohol is in your drink. In the [place] one drink equals one 12 oz bottle of beer (355 mL), one 5 oz glass of wine (148 mL), or one 1 oz glass of hard liquor (44 mL). Lifestyle Do not use any products that contain nicotine or tobacco. These products include cigarettes, chewing tobacco, and vaping devices, such as e-cigarettes. If you need help quitting, ask your health care provider. Do not use street drugs. Do not share needles. Ask your health care provider for help if you need support or information about quitting drugs. General instructions Schedule regular health, dental, and eye exams. Stay current with your vaccines. Tell your health care provider if: You often feel depressed. You have ever been abused or do not feel safe at home. Summary Adopting a healthy lifestyle and getting preventive care are important in promoting health and wellness. Follow your health care provider's instructions about healthy diet, exercising, and getting tested or screened for diseases. Follow your health care provider's instructions on monitoring your cholesterol and blood pressure. This information is not intended to replace advice given to you by your health care provider.  Make sure you discuss any questions you have with your health care provider. Document Revised: 01/04/2021 Document Reviewed: 01/04/2021 Elsevier Patient Education  [place].

## 2022-06-24 ENCOUNTER — Ambulatory Visit (INDEPENDENT_AMBULATORY_CARE_PROVIDER_SITE_OTHER): Payer: Medicare HMO | Admitting: Family Medicine

## 2022-06-24 VITALS — BP 143/74 | HR 110 | Ht 67.0 in | Wt 146.0 lb

## 2022-06-24 DIAGNOSIS — E785 Hyperlipidemia, unspecified: Secondary | ICD-10-CM

## 2022-06-24 DIAGNOSIS — G500 Trigeminal neuralgia: Secondary | ICD-10-CM | POA: Diagnosis not present

## 2022-06-24 DIAGNOSIS — E782 Mixed hyperlipidemia: Secondary | ICD-10-CM

## 2022-06-24 DIAGNOSIS — M542 Cervicalgia: Secondary | ICD-10-CM

## 2022-06-24 DIAGNOSIS — G8929 Other chronic pain: Secondary | ICD-10-CM

## 2022-06-24 DIAGNOSIS — E039 Hypothyroidism, unspecified: Secondary | ICD-10-CM | POA: Diagnosis not present

## 2022-06-24 DIAGNOSIS — M5136 Other intervertebral disc degeneration, lumbar region: Secondary | ICD-10-CM

## 2022-06-24 MED ORDER — HYDROCODONE-ACETAMINOPHEN 5-325 MG PO TABS: ORAL_TABLET | ORAL | 0 refills | Status: DC

## 2022-06-24 MED ORDER — HYDROCODONE-ACETAMINOPHEN 5-325 MG PO TABS: 1.0000 | ORAL_TABLET | Freq: Two times a day (BID) | ORAL | 0 refills | Status: DC | PRN

## 2022-06-24 NOTE — Assessment & Plan Note (Signed)
Check TSH today since we made an adjustment to her regimen hopefully she is back in the therapeutic range.

## 2022-06-24 NOTE — Assessment & Plan Note (Signed)
Indication for chronic opioid: DDD Cervical and lumbar Medication and dose: Hydrocodone 5/325 # pills per month: 60 Last UDS date:12/23/21/22 Opioid Treatment Agreement signed (Y/N): Y, 06/24/22 NCCSRS reviewed this encounter (include red flags): Yes  Follow up: 3 months.

## 2022-06-24 NOTE — Assessment & Plan Note (Signed)
We will recheck labs to see if Crestor is more effective in controlling her lipids.  We can always add Zetia if needed.

## 2022-06-24 NOTE — Progress Notes (Signed)
Established Patient Office Visit  Subjective   Patient ID: [name], female    DOB: 1945-11-25  Age: 76 y.o. MRN: [id number]  Chief Complaint  Patient presents with   Pain Management    HPI  Follow-up chronic pain management- -she feels like overall she is doing well on her medication regimen for chronic low back pain she takes about 60 tabs per month sometimes it lasts her an extra day or 2 occasionally she will skip a tab.  She says she has been walking with her husband.  She denies any constipation.  She does feel like the medication is helpful and effective.  She denies any flares or exacerbations.  Follow-up hyperlipidemia-recently switched to rosuvastatin off of Pravachol because of uncontrolled cholesterol levels.  So far she is tolerating the medication well without any problems or side effects.  Follow-up hypothyroidism-last TSH 3 months ago was 5.3.  Her levothyroxine was bumped to 75 mcg daily and she is due to recheck those levels.    ROS    Objective:     BP (!) 153/76   Pulse (!) 110   Ht '5\' 7"'$  (1.702 m)   Wt 146 lb (66.2 kg)   SpO2 95%   BMI 22.87 kg/m    Physical Exam Vitals and nursing note reviewed.  Constitutional:      Appearance: She is well-developed.  HENT:     Head: Normocephalic and atraumatic.  Cardiovascular:     Rate and Rhythm: Normal rate and regular rhythm.     Heart sounds: Normal heart sounds.  Pulmonary:     Effort: Pulmonary effort is normal.     Breath sounds: Normal breath sounds.  Skin:    General: Skin is warm and dry.  Neurological:     Mental Status: She is alert and oriented to person, place, and time.  Psychiatric:        Behavior: Behavior normal.      No results found for any visits on 06/24/22.    The 10-year ASCVD risk score (Arnett DK, et al., 2019) is: 29.3%    Assessment & Plan:   Problem List Items Addressed This Visit       Endocrine   Hypothyroidism    Check TSH today since we made an  adjustment to her regimen hopefully she is back in the therapeutic range.        Nervous and Auditory   NEURALGIA, TRIGEMINAL   Relevant Medications   HYDROcodone-acetaminophen (NORCO/VICODIN) 5-325 MG tablet (Start on 08/24/2022)   HYDROcodone-acetaminophen (NORCO/VICODIN) 5-325 MG tablet (Start on 07/25/2022)   HYDROcodone-acetaminophen (NORCO/VICODIN) 5-325 MG tablet     Musculoskeletal and Integument   Degenerative disc disease, lumbar   Relevant Medications   HYDROcodone-acetaminophen (NORCO/VICODIN) 5-325 MG tablet (Start on 08/24/2022)   HYDROcodone-acetaminophen (NORCO/VICODIN) 5-325 MG tablet (Start on 07/25/2022)   HYDROcodone-acetaminophen (NORCO/VICODIN) 5-325 MG tablet     Other   NECK PAIN   Relevant Medications   HYDROcodone-acetaminophen (NORCO/VICODIN) 5-325 MG tablet (Start on 08/24/2022)   HYDROcodone-acetaminophen (NORCO/VICODIN) 5-325 MG tablet (Start on 07/25/2022)   HYDROcodone-acetaminophen (NORCO/VICODIN) 5-325 MG tablet   HYPERLIPIDEMIA, MIXED    We will recheck labs to see if Crestor is more effective in controlling her lipids.  We can always add Zetia if needed.      Encounter for chronic pain management - Primary    Indication for chronic opioid: DDD Cervical and lumbar Medication and dose: Hydrocodone 5/325 # pills per month: 60  Last UDS date: 12/23/21/22 Opioid Treatment Agreement signed (Y/N): Y, 06/24/22 NCCSRS reviewed this encounter (include red flags):  Yes  Follow up: 3 months.       Relevant Medications   HYDROcodone-acetaminophen (NORCO/VICODIN) 5-325 MG tablet (Start on 08/24/2022)   HYDROcodone-acetaminophen (NORCO/VICODIN) 5-325 MG tablet (Start on 07/25/2022)   HYDROcodone-acetaminophen (NORCO/VICODIN) 5-325 MG tablet   Other Relevant Orders   DRUG MONITORING, PANEL 6 WITH CONFIRMATION, URINE   Other Visit Diagnoses     Elevated lipids           Return in about 3 months (around 09/24/2022) for chronic pain.     Beatrice Lecher, MD

## 2022-06-25 LAB — TSH: TSH: 4.68 mIU/L — ABNORMAL HIGH (ref 0.40–4.50)

## 2022-06-27 ENCOUNTER — Other Ambulatory Visit: Payer: Self-pay | Admitting: *Deleted

## 2022-06-27 DIAGNOSIS — M542 Cervicalgia: Secondary | ICD-10-CM

## 2022-06-27 DIAGNOSIS — M5136 Other intervertebral disc degeneration, lumbar region: Secondary | ICD-10-CM

## 2022-06-27 DIAGNOSIS — G8929 Other chronic pain: Secondary | ICD-10-CM

## 2022-06-27 DIAGNOSIS — G500 Trigeminal neuralgia: Secondary | ICD-10-CM

## 2022-06-27 MED ORDER — HYDROCODONE-ACETAMINOPHEN 5-325 MG PO TABS: 1.0000 | ORAL_TABLET | Freq: Two times a day (BID) | ORAL | 0 refills | Status: DC | PRN

## 2022-06-27 MED ORDER — LEVOTHYROXINE SODIUM 88 MCG PO TABS: 88.0000 ug | ORAL_TABLET | Freq: Every day | ORAL | 0 refills | Status: DC

## 2022-06-27 NOTE — Progress Notes (Signed)
Sent 9mg to pharmacy. Recheck labs in 3 months.

## 2022-06-27 NOTE — Telephone Encounter (Signed)
Pt's medications were sent to her mail order. I called and spoke to Holley Bouche. She is going to cancel these so that they can be sent to her local pharmacy.   Sent to [place] to sign.

## 2022-06-27 NOTE — Progress Notes (Signed)
Call pt: thyroid is better but not quite t goal.  Pls verify how taking new pill befoe I make a change.

## 2022-06-27 NOTE — Addendum Note (Signed)
Addended by: Donella Stade on: 06/27/2022 10:01 AM   Modules accepted: Orders

## 2022-07-05 ENCOUNTER — Telehealth: Payer: Self-pay | Admitting: *Deleted

## 2022-07-05 DIAGNOSIS — G3184 Mild cognitive impairment, so stated: Secondary | ICD-10-CM

## 2022-07-05 MED ORDER — DONEPEZIL HCL 5 MG PO TBDP: 5.0000 mg | ORAL_TABLET | Freq: Every day | ORAL | 0 refills | Status: DC

## 2022-07-05 NOTE — Telephone Encounter (Signed)
Pt's husband lvm stating that Dr. Madilyn Fireman was supposed to have sent over a new prescription for her to [facility] in [place].   The medication is for her memory. He stated that he had spoken to her about this when he saw her last week during his appointment.

## 2022-07-05 NOTE — Telephone Encounter (Signed)
Meds ordered this encounter  Medications   donepezil (ARICEPT ODT) 5 MG disintegrating tablet    Sig: Take 1 tablet (5 mg total) by mouth at bedtime.    Dispense:  90 tablet    Refill:  0

## 2022-08-01 ENCOUNTER — Other Ambulatory Visit: Payer: Self-pay | Admitting: *Deleted

## 2022-08-01 DIAGNOSIS — G500 Trigeminal neuralgia: Secondary | ICD-10-CM

## 2022-08-01 DIAGNOSIS — M5136 Other intervertebral disc degeneration, lumbar region: Secondary | ICD-10-CM

## 2022-08-01 DIAGNOSIS — G8929 Other chronic pain: Secondary | ICD-10-CM

## 2022-08-01 DIAGNOSIS — M542 Cervicalgia: Secondary | ICD-10-CM

## 2022-08-01 MED ORDER — HYDROCODONE-ACETAMINOPHEN 5-325 MG PO TABS: ORAL_TABLET | ORAL | 0 refills | Status: DC

## 2022-08-01 MED ORDER — HYDROCODONE-ACETAMINOPHEN 5-325 MG PO TABS: 1.0000 | ORAL_TABLET | Freq: Two times a day (BID) | ORAL | 0 refills | Status: DC | PRN

## 2022-08-01 NOTE — Telephone Encounter (Signed)
Pt's husband called to obtain refill for her pain medication for this month. He was told by the pharmacy that he would need to call our office to get this refill sent to the pharmacy.  I looked over her med list and her Hydrocodone-acetaminophen was sent to her mail order pharmacy. I spoke to [place] at [facility] order and asked that the 2 remaining refills be d/c'd she informed me that they were and that the patient can go have these filled at her local pharmacy. ([facility])  Will fwd to pcp for renewal.

## 2022-08-03 ENCOUNTER — Other Ambulatory Visit: Payer: Self-pay | Admitting: Sports Medicine

## 2022-08-03 DIAGNOSIS — E039 Hypothyroidism, unspecified: Secondary | ICD-10-CM

## 2022-09-23 ENCOUNTER — Other Ambulatory Visit: Payer: Self-pay | Admitting: Family Medicine

## 2022-09-23 DIAGNOSIS — G3184 Mild cognitive impairment, so stated: Secondary | ICD-10-CM

## 2022-09-26 ENCOUNTER — Ambulatory Visit (INDEPENDENT_AMBULATORY_CARE_PROVIDER_SITE_OTHER): Payer: Medicare HMO | Admitting: Family Medicine

## 2022-09-26 VITALS — BP 153/75 | HR 80 | Ht 66.0 in | Wt 146.0 lb

## 2022-09-26 DIAGNOSIS — E039 Hypothyroidism, unspecified: Secondary | ICD-10-CM

## 2022-09-26 DIAGNOSIS — G500 Trigeminal neuralgia: Secondary | ICD-10-CM

## 2022-09-26 DIAGNOSIS — Z780 Asymptomatic menopausal state: Secondary | ICD-10-CM

## 2022-09-26 DIAGNOSIS — M5136 Other intervertebral disc degeneration, lumbar region: Secondary | ICD-10-CM | POA: Diagnosis not present

## 2022-09-26 DIAGNOSIS — E782 Mixed hyperlipidemia: Secondary | ICD-10-CM

## 2022-09-26 DIAGNOSIS — M542 Cervicalgia: Secondary | ICD-10-CM | POA: Diagnosis not present

## 2022-09-26 DIAGNOSIS — M51369 Other intervertebral disc degeneration, lumbar region without mention of lumbar back pain or lower extremity pain: Secondary | ICD-10-CM

## 2022-09-26 DIAGNOSIS — G8929 Other chronic pain: Secondary | ICD-10-CM

## 2022-09-26 MED ORDER — HYDROCODONE-ACETAMINOPHEN 5-325 MG PO TABS: 1.0000 | ORAL_TABLET | Freq: Two times a day (BID) | ORAL | 0 refills | Status: DC | PRN

## 2022-09-26 MED ORDER — HYDROCODONE-ACETAMINOPHEN 5-325 MG PO TABS: ORAL_TABLET | ORAL | 0 refills | Status: DC

## 2022-09-26 NOTE — Progress Notes (Signed)
Established Patient Office Visit  Subjective   Patient ID: [name], female    DOB: 11-11-1945  Age: 77 y.o. MRN: [id number]  Chief Complaint  Patient presents with   Follow-up    HPI F/U chronic pain management -she reports that she is doing really well.  She is staying active.  She has not had any problems or complications with her medication.  She does get some occasional constipation.  She been physically feeling well.  Hypothyroidism - Taking medication regularly in the AM away from food and vitamins, etc. No recent change to skin, hair, or energy levels.  Hyperlipidemia - tolerating stating well with no myalgias or significant side effects.  Lab Results  Component Value Date   CHOL 245 (H) 03/24/2022   HDL 65 03/24/2022   LDLCALC 132 (H) 03/24/2022   TRIG 337 (H) 03/24/2022   CHOLHDL 3.8 03/24/2022         ROS    Objective:     BP (!) 153/75   Pulse 80   Ht '5\' 6"'$  (1.676 m)   Wt 146 lb (66.2 kg)   SpO2 99%   BMI 23.57 kg/m    Physical Exam Vitals and nursing note reviewed.  Constitutional:      Appearance: She is well-developed.  HENT:     Head: Normocephalic and atraumatic.  Cardiovascular:     Rate and Rhythm: Normal rate and regular rhythm.     Heart sounds: Normal heart sounds.  Pulmonary:     Effort: Pulmonary effort is normal.     Breath sounds: Normal breath sounds.  Skin:    General: Skin is warm and dry.  Neurological:     Mental Status: She is alert and oriented to person, place, and time.  Psychiatric:        Behavior: Behavior normal.      No results found for any visits on 09/26/22.    The 10-year ASCVD risk score (Arnett DK, et al., 2019) is: 29.3%    Assessment & Plan:   Problem List Items Addressed This Visit       Endocrine   Hypothyroidism    TSH was just a little above goal at 4.63 months ago so would like it to recheck it again today      Relevant Orders   TSH   Lipid Panel w/reflex Direct LDL      Nervous and Auditory   NEURALGIA, TRIGEMINAL   Relevant Medications   HYDROcodone-acetaminophen (NORCO/VICODIN) 5-325 MG tablet   HYDROcodone-acetaminophen (NORCO/VICODIN) 5-325 MG tablet (Start on 10/26/2022)   HYDROcodone-acetaminophen (NORCO/VICODIN) 5-325 MG tablet (Start on 11/23/2022)     Musculoskeletal and Integument   Degenerative disc disease, lumbar   Relevant Medications   HYDROcodone-acetaminophen (NORCO/VICODIN) 5-325 MG tablet   HYDROcodone-acetaminophen (NORCO/VICODIN) 5-325 MG tablet (Start on 10/26/2022)   HYDROcodone-acetaminophen (NORCO/VICODIN) 5-325 MG tablet (Start on 11/23/2022)     Other   NECK PAIN   Relevant Medications   HYDROcodone-acetaminophen (NORCO/VICODIN) 5-325 MG tablet   HYDROcodone-acetaminophen (NORCO/VICODIN) 5-325 MG tablet (Start on 10/26/2022)   HYDROcodone-acetaminophen (NORCO/VICODIN) 5-325 MG tablet (Start on 11/23/2022)   HYPERLIPIDEMIA, MIXED   Relevant Orders   Lipid Panel w/reflex Direct LDL   Encounter for chronic pain management - Primary    Indication for chronic opioid: DDD Cervical and lumbar Medication and dose: Hydrocodone 5/325 # pills per month: 60    Last UDS date: 12/23/21/22, she is overdue. Didn't return las time.  Opioid Treatment Agreement signed (Y/N):  Y, 06/24/22 NCCSRS reviewed this encounter (include red flags):  Yes   Follow up: 3 months.       Relevant Medications   HYDROcodone-acetaminophen (NORCO/VICODIN) 5-325 MG tablet   HYDROcodone-acetaminophen (NORCO/VICODIN) 5-325 MG tablet (Start on 10/26/2022)   HYDROcodone-acetaminophen (NORCO/VICODIN) 5-325 MG tablet (Start on 11/23/2022)   Other Visit Diagnoses     Post-menopausal       Relevant Orders   DG Bone Density       Return in about 3 months (around 12/26/2022) for chronic pain mgt.    Beatrice Lecher, MD

## 2022-09-26 NOTE — Assessment & Plan Note (Signed)
Indication for chronic opioid: DDD Cervical and lumbar Medication and dose: Hydrocodone 5/325 # pills per month: 60    Last UDS date: 12/23/21/22, she is overdue. Didn't return las time.  Opioid Treatment Agreement signed (Y/N): Y, 06/24/22 NCCSRS reviewed this encounter (include red flags):  Yes   Follow up: 3 months.

## 2022-09-26 NOTE — Assessment & Plan Note (Signed)
TSH was just a little above goal at 4.63 months ago so would like it to recheck it again today

## 2022-09-27 LAB — LIPID PANEL W/REFLEX DIRECT LDL
Cholesterol: 235 mg/dL — ABNORMAL HIGH (ref <200)
HDL: 71 mg/dL (ref >=50)
LDL Cholesterol (Calc): 124 mg/dL (calc) — ABNORMAL HIGH
Non-HDL Cholesterol (Calc): 164 mg/dL (calc) — ABNORMAL HIGH (ref <130)
Total CHOL/HDL Ratio: 3.3 (calc) (ref <5.0)
Triglycerides: 261 mg/dL — ABNORMAL HIGH (ref <150)

## 2022-09-27 LAB — TSH: TSH: 3.64 mIU/L (ref 0.40–4.50)

## 2022-09-27 NOTE — Progress Notes (Signed)
Call patient: LDL cholesterol looks a little better this year at 124.  Great work and bringing that down.  Thyroid level is also better at 3.6.  So just continue to take your medication regularly try not to miss any doses if you can help it.

## 2022-10-05 ENCOUNTER — Other Ambulatory Visit: Payer: Self-pay | Admitting: Internal Medicine

## 2022-10-05 ENCOUNTER — Ambulatory Visit (INDEPENDENT_AMBULATORY_CARE_PROVIDER_SITE_OTHER): Payer: Medicare HMO

## 2022-10-05 DIAGNOSIS — Z1231 Encounter for screening mammogram for malignant neoplasm of breast: Secondary | ICD-10-CM

## 2022-10-05 DIAGNOSIS — Z780 Asymptomatic menopausal state: Secondary | ICD-10-CM

## 2022-10-05 DIAGNOSIS — M810 Age-related osteoporosis without current pathological fracture: Secondary | ICD-10-CM | POA: Diagnosis not present

## 2022-10-05 NOTE — Progress Notes (Signed)
HI Melissia, bone density score -2.8. continue with Prolia injections every 6 months in addition to adequate daily calcium with Vit D intake.

## 2022-10-20 ENCOUNTER — Other Ambulatory Visit: Payer: Self-pay | Admitting: Family Medicine

## 2022-10-21 ENCOUNTER — Ambulatory Visit
Admission: EM | Admit: 2022-10-21 | Discharge: 2022-10-21 | Disposition: A | Discharge status: Home / Self Care | Payer: Medicare HMO | Attending: Urgent Care | Admitting: Urgent Care

## 2022-10-21 ENCOUNTER — Encounter: Payer: Self-pay | Admitting: Emergency Medicine

## 2022-10-21 DIAGNOSIS — H938X1 Other specified disorders of right ear: Secondary | ICD-10-CM

## 2022-10-21 DIAGNOSIS — J309 Allergic rhinitis, unspecified: Secondary | ICD-10-CM

## 2022-10-21 DIAGNOSIS — H6991 Unspecified Eustachian tube disorder, right ear: Secondary | ICD-10-CM

## 2022-10-21 MED ORDER — FLUTICASONE PROPIONATE 50 MCG/ACT NA SUSP: 2.0000 | Freq: Every day | NASAL | 0 refills | Status: DC

## 2022-10-21 MED ORDER — PSEUDOEPHEDRINE HCL 30 MG PO TABS: 30.0000 mg | ORAL_TABLET | Freq: Three times a day (TID) | ORAL | 0 refills | Status: DC | PRN

## 2022-10-21 NOTE — ED Triage Notes (Signed)
Right ear pain since Wednesday OTC meds - tylenol - provides relief

## 2022-10-21 NOTE — ED Provider Notes (Signed)
[facility]  Note:  This document was prepared using Dragon voice recognition software and may include unintentional dictation errors.  MRN: [id number] DOB: 12/23/1945  Subjective:   [name] is a 77 y.o. female presenting for 3 day history of acute onset right ear buzzing/ringing, occasional mild pain.  No fever, runny or stuffy nose, dizziness, ear drainage, ear trauma, sore throat, cough.  Patient has allergic rhinitis.  Takes levocetirizine for this.  No recent swimming, airplane travel.  No current facility-administered medications for this encounter.  Current Outpatient Medications:    azelastine (ASTELIN) 0.1 % nasal spray, Place 2 sprays into both nostrils daily. (Patient not taking: Reported on 10/21/2022), Disp: , Rfl:    Calcium-Magnesium-Vitamin D (CALCIUM 1200+D3 PO), Take by mouth daily. , Disp: , Rfl:    carbamazepine (TEGRETOL XR) 100 MG 12 hr tablet, Take 100 mg by mouth 2 (two) times daily., Disp: , Rfl:    denosumab (PROLIA) 60 MG/ML SOLN injection, Inject 60 mg into the skin every 6 (six) months. Administer in upper arm, thigh, or abdomen, Disp: , Rfl:    donepezil (ARICEPT ODT) 5 MG disintegrating tablet, TAKE 1 TABLET BY MOUTH AT BEDTIME, Disp: 90 tablet, Rfl: 1   dorzolamide-timolol (COSOPT) 22.3-6.8 MG/ML ophthalmic solution, 1 drop 2 (two) times daily., Disp: , Rfl:    fish oil-omega-3 fatty acids 1000 MG capsule, Take 2 g by mouth daily., Disp: , Rfl:    HYDROcodone-acetaminophen (NORCO/VICODIN) 5-325 MG tablet, Take 1 tablet by mouth every 12 (twelve) hours as needed for moderate pain., Disp: 60 tablet, Rfl: 0   [START ON 10/26/2022] HYDROcodone-acetaminophen (NORCO/VICODIN) 5-325 MG tablet, TAKE 1 TABLET EVERY TWELVE HOURS AS NEEDEDTAKE 1 TABLET EVERY TWELVE HOURS AS NEEDED (Patient not taking: Reported on 10/21/2022), Disp: 60 tablet, Rfl: 0   [START ON 11/23/2022] HYDROcodone-acetaminophen (NORCO/VICODIN) 5-325 MG tablet, Take 1 tablet by  mouth every 12 (twelve) hours as needed for moderate pain., Disp: 60 tablet, Rfl: 0   latanoprost (XALATAN) 0.005 % ophthalmic solution, , Disp: , Rfl:    levocetirizine (XYZAL) 5 MG tablet, Take 5 mg by mouth daily., Disp: , Rfl:    levothyroxine (SYNTHROID) 88 MCG tablet, Take 1 tablet (88 mcg total) by mouth daily., Disp: 90 tablet, Rfl: 0   omeprazole (PRILOSEC) 40 MG capsule, TAKE 1 CAPSULE BY MOUTH EVERY DAY, Disp: 90 capsule, Rfl: 3   PREMARIN vaginal cream, INERT 1 APPLICATORFUL VAGINALLY NIGHTLY 1 WEEK & THEN 3 NIGHTS A WEEK THEREAFTER, Disp: , Rfl:    rosuvastatin (CRESTOR) 20 MG tablet, Take 1 tablet (20 mg total) by mouth daily., Disp: 90 tablet, Rfl: 3   senna-docusate (SENOKOT-S) 8.6-50 MG tablet, Take 2 tablets by mouth at bedtime., Disp: , Rfl:    tamoxifen (NOLVADEX) 20 MG tablet, Take 1 tablet (20 mg total) by mouth daily., Disp: 90 tablet, Rfl: 3   trimethoprim (TRIMPEX) 100 MG tablet, Take 100 mg by mouth daily., Disp: , Rfl:    Allergies  Allergen Reactions   Simvastatin Other (See Comments)    Aches and memory loss   Azithromycin Hives   Doxycycline Itching   Other Rash   Trazodone And Nefazodone Other (See Comments)    Memory impairment and palpitations.    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   Degenerative disc disease    History of bladder infections    RECURRENT   Hyperlipidemia    Osteoporosis 03/04/2011    LAST BONE DENSITY 03-04-2011   Personal history of chemotherapy    Recurrent sinusitis    Thyroid disease    Trigeminal neuralgia      Past Surgical History:  Procedure Laterality Date   ABDOMINAL HYSTERECTOMY  2005   Complete   BLADDER SUSPENSION  2005   BREAST LUMPECTOMY Left 1999   malignant   BREAST SURGERY  1999   Lumpectomy L Br    BREAST SURGERY  2007   Lymph Node Removal L Chest Wall   cancerous LN removed  2008   left breast lumpectomy  1999    Family History  Problem Relation Age of Onset   Cancer Mother        breast   Breast cancer Mother    Stroke Mother    Hyperlipidemia Mother    Cancer Father        bladder   Colon cancer Father    Breast cancer Maternal Aunt    Cancer Maternal Aunt        Breast   Stomach cancer Paternal Uncle    Stomach cancer Paternal Uncle    Cancer Paternal Grandmother        Mouth    Social History   Tobacco Use   Smoking status: Never   Smokeless tobacco: Never  Vaping Use   Vaping Use: Never used  Substance Use Topics   Alcohol use: No    Alcohol/week: 0.0 standard drinks of alcohol   Drug use: No    ROS   Objective:   Vitals: BP (!) 166/102 (BP Location: Right Arm) Comment: white coat syndrome per pt  Pulse 96   Temp 98.5 F (36.9 C) (Oral)   Resp 14   Ht '5\' 6"'$  (1.676 m)   Wt 123 lb (55.8 kg)   SpO2 95%   BMI 19.85 kg/m   BP Readings from Last 3 Encounters:  10/21/22 (!) 166/102  09/26/22 (!) 153/75  06/24/22 (!) 143/74   Physical Exam Constitutional:      General: She is not in acute distress.    Appearance: Normal appearance. She is well-developed. She is not ill-appearing, toxic-appearing or diaphoretic.  HENT:     Head: Normocephalic and atraumatic.     Right Ear: Tympanic membrane, ear canal and external ear normal. No tenderness. There is no impacted cerumen. Tympanic membrane is not injected, perforated, erythematous or bulging.     Left Ear: Tympanic membrane, ear canal and external ear normal. No tenderness. There is no impacted cerumen. Tympanic membrane is not injected, perforated, erythematous or bulging.     Nose: Nose normal.     Mouth/Throat:     Mouth: Mucous membranes are moist.  Eyes:     General: No scleral icterus.       Right eye: No discharge.        Left eye: No discharge.     Extraocular Movements:  Extraocular movements intact.  Cardiovascular:     Rate and Rhythm: Normal rate.  Pulmonary:     Effort: Pulmonary effort is normal.  Skin:    General: Skin is warm and dry.  Neurological:     General: No focal deficit present.     Mental Status: She is alert and oriented to person, place, and time.  Psychiatric:  Mood and Affect: Mood normal.        Behavior: Behavior normal.     Assessment and Plan :   PDMP not reviewed this encounter.  1. Ear fullness, right   2. Eustachian tube dysfunction, right   3. Allergic rhinitis, unspecified seasonality, unspecified trigger     No cerumen impaction, overt signs of otitis media. Unremarkable ENT exam.  Will use conservative management for what I suspect is eustachian tube dysfunction.  Recommended starting Flonase, keep taking levocetirizine, use low-dose Sudafed as needed. Counseled patient on potential for adverse effects with medications prescribed/recommended today, ER and return-to-clinic precautions discussed, patient verbalized understanding.    Jaynee Eagles, [place] 10/21/22 1614

## 2022-10-21 NOTE — Discharge Instructions (Addendum)
Keep taking levocetirizine. Start taking Flonase daily together with levocetirizine. This weekend try using pseudoephedrine and then starting next week only if you need it.

## 2022-10-27 ENCOUNTER — Ambulatory Visit: Payer: Medicare HMO | Admitting: Family Medicine

## 2022-10-28 ENCOUNTER — Ambulatory Visit (INDEPENDENT_AMBULATORY_CARE_PROVIDER_SITE_OTHER): Payer: Medicare HMO | Admitting: Family Medicine

## 2022-10-28 VITALS — BP 148/86 | HR 117 | Ht 66.0 in | Wt 144.0 lb

## 2022-10-28 DIAGNOSIS — H9311 Tinnitus, right ear: Secondary | ICD-10-CM

## 2022-10-28 DIAGNOSIS — H6121 Impacted cerumen, right ear: Secondary | ICD-10-CM

## 2022-10-28 NOTE — Progress Notes (Signed)
   Acute Office Visit  Subjective:     Patient ID: [name], female    DOB: 03-07-1946, 77 y.o.   MRN: [id number]  Chief Complaint  Patient presents with   Follow-up   Ear Pain    HPI Patient is in today for right ear pain and fullness.  She was seen about a week ago at urgent care and had a normal exam at that time they recommended Flonase and continuing on her oral antihistamine.  ROS      Objective:    BP (!) 148/86 (BP Location: Left Arm, Cuff Size: Normal)   Pulse (!) 117   Ht '5\' 6"'$  (1.676 m)   Wt 144 lb 0.6 oz (65.3 kg)   SpO2 99%   BMI 23.25 kg/m    Physical Exam Vitals reviewed.  Constitutional:      Appearance: She is well-developed.  HENT:     Head: Normocephalic and atraumatic.     Ears:     Comments: Right canal blocked about 60% by cerumen.  The upper portion of the TM looks clear no erythema the ossicle looks normal. Eyes:     Conjunctiva/sclera: Conjunctivae normal.  Cardiovascular:     Rate and Rhythm: Normal rate.  Pulmonary:     Effort: Pulmonary effort is normal.  Skin:    General: Skin is dry.     Coloration: Skin is not pale.  Neurological:     Mental Status: She is alert and oriented to person, place, and time.  Psychiatric:        Behavior: Behavior normal.     No results found for any visits on 10/28/22.      Assessment & Plan:   Problem List Items Addressed This Visit   None Visit Diagnoses     Impacted cerumen of right ear    -  Primary   Tinnitus of right ear          Think she is most likely experiencing tinnitus.  It is a constant low-grade noise in her ear.  But she did have a fair amount of wax so we did an irrigation today to see if that improves her symptoms also just wanted to make sure that I did not see any fluid after we removed the wax.  No effusion present.  Discussed the benign sure of tinnitus but it can be persistent and frustrating.  If it worsens or changes in nature then she can let us know.  No  orders of the defined types were placed in this encounter.  Indication: Cerumen impaction of the ear(s) Medical necessity statement: On physical examination, cerumen impairs clinically significant portions of the external auditory canal, and tympanic membrane. Noted obstructive, copious cerumen that cannot be removed without magnification and instrumentations  Consent: Discussed benefits and risks of procedure and verbal consent obtained Procedure: Patient was prepped for the procedure. Utilized an otoscope to assess and take note of the ear canal, the tympanic membrane, and the presence, amount, and placement of the cerumen. Gentle water irrigation and soft plastic curette was utilized to remove cerumen.  Post procedure examination: shows cerumen was completely removed. Patient tolerated procedure well. The patient is made aware that they may experience temporary vertigo, temporary hearing loss, and temporary discomfort. If these symptom last for more than 24 hours to call the clinic or proceed to the ED.   No follow-ups on file.  Beatrice Lecher, MD

## 2022-11-02 DIAGNOSIS — Z170 Estrogen receptor positive status [ER+]: Secondary | ICD-10-CM | POA: Diagnosis not present

## 2022-11-02 DIAGNOSIS — C771 Secondary and unspecified malignant neoplasm of intrathoracic lymph nodes: Secondary | ICD-10-CM | POA: Diagnosis not present

## 2022-11-02 DIAGNOSIS — C50512 Malignant neoplasm of lower-outer quadrant of left female breast: Secondary | ICD-10-CM | POA: Diagnosis not present

## 2022-11-02 DIAGNOSIS — N952 Postmenopausal atrophic vaginitis: Secondary | ICD-10-CM | POA: Diagnosis not present

## 2022-11-02 DIAGNOSIS — Z842 Family history of other diseases of the genitourinary system: Secondary | ICD-10-CM | POA: Diagnosis not present

## 2022-11-02 DIAGNOSIS — T386X5A Adverse effect of antigonadotrophins, antiestrogens, antiandrogens, not elsewhere classified, initial encounter: Secondary | ICD-10-CM | POA: Diagnosis not present

## 2022-11-02 DIAGNOSIS — Z5181 Encounter for therapeutic drug level monitoring: Secondary | ICD-10-CM | POA: Diagnosis not present

## 2022-11-02 DIAGNOSIS — M818 Other osteoporosis without current pathological fracture: Secondary | ICD-10-CM | POA: Diagnosis not present

## 2022-12-14 ENCOUNTER — Ambulatory Visit: Payer: Medicare HMO

## 2022-12-21 ENCOUNTER — Ambulatory Visit (INDEPENDENT_AMBULATORY_CARE_PROVIDER_SITE_OTHER): Payer: Medicare HMO

## 2022-12-21 DIAGNOSIS — Z1231 Encounter for screening mammogram for malignant neoplasm of breast: Secondary | ICD-10-CM

## 2022-12-22 NOTE — Progress Notes (Signed)
Please call patient. Normal mammogram.  Repeat in 1 year.  

## 2023-01-05 ENCOUNTER — Telehealth: Payer: Self-pay | Admitting: Family Medicine

## 2023-01-05 DIAGNOSIS — G500 Trigeminal neuralgia: Secondary | ICD-10-CM

## 2023-01-05 DIAGNOSIS — N3020 Other chronic cystitis without hematuria: Secondary | ICD-10-CM | POA: Diagnosis not present

## 2023-01-05 DIAGNOSIS — M5136 Other intervertebral disc degeneration, lumbar region: Secondary | ICD-10-CM

## 2023-01-05 DIAGNOSIS — N7689 Other specified inflammation of vagina and vulva: Secondary | ICD-10-CM | POA: Diagnosis not present

## 2023-01-05 DIAGNOSIS — G8929 Other chronic pain: Secondary | ICD-10-CM

## 2023-01-05 DIAGNOSIS — M542 Cervicalgia: Secondary | ICD-10-CM

## 2023-01-05 MED ORDER — HYDROCODONE-ACETAMINOPHEN 5-325 MG PO TABS
1.0000 | ORAL_TABLET | Freq: Two times a day (BID) | ORAL | 0 refills | Status: DC | PRN
Start: 2023-01-05 — End: 2023-04-04

## 2023-01-05 NOTE — Telephone Encounter (Signed)
Patient called requesting a refill of ; Hydrocondone-acetaminophem 5-325mg    Pharmacy ;  [facility]  [phone]

## 2023-01-05 NOTE — Telephone Encounter (Signed)
Meds ordered this encounter  Medications   HYDROcodone-acetaminophen (NORCO/VICODIN) 5-325 MG tablet    Sig: Take 1 tablet by mouth every 12 (twelve) hours as needed for moderate pain.    Dispense:  60 tablet    Refill:  0

## 2023-01-30 ENCOUNTER — Telehealth: Payer: Self-pay | Admitting: Family Medicine

## 2023-01-30 DIAGNOSIS — E559 Vitamin D deficiency, unspecified: Secondary | ICD-10-CM | POA: Diagnosis not present

## 2023-01-30 NOTE — Telephone Encounter (Signed)
Patient called requesting a refill of omeprazole (PRILOSEC) 40 MG capsule [[id number]]   Prescription was sent to [facility] but the patient would like it sent to another pharmacy.  Pharmacy -  [facility] [id number] - [place], [place] - [address]

## 2023-01-31 MED ORDER — OMEPRAZOLE 40 MG PO CPDR: DELAYED_RELEASE_CAPSULE | ORAL | 3 refills | Status: AC

## 2023-01-31 NOTE — Telephone Encounter (Signed)
Refill sent to pharmacy listed.

## 2023-02-28 ENCOUNTER — Telehealth: Payer: Self-pay | Admitting: Family Medicine

## 2023-02-28 DIAGNOSIS — G8929 Other chronic pain: Secondary | ICD-10-CM

## 2023-02-28 DIAGNOSIS — M5136 Other intervertebral disc degeneration, lumbar region: Secondary | ICD-10-CM

## 2023-02-28 DIAGNOSIS — G500 Trigeminal neuralgia: Secondary | ICD-10-CM

## 2023-02-28 DIAGNOSIS — M542 Cervicalgia: Secondary | ICD-10-CM

## 2023-02-28 NOTE — Telephone Encounter (Signed)
Patient called requesting a refill of HYDROcodone-acetaminophen (NORCO/VICODIN) 5-325 MG tablet [[id number]]   Pharmacy ;   [facility] [id number] - [place], [place] - [address]

## 2023-03-01 MED ORDER — HYDROCODONE-ACETAMINOPHEN 5-325 MG PO TABS
1.0000 | ORAL_TABLET | Freq: Two times a day (BID) | ORAL | 0 refills | Status: DC | PRN
Start: 2023-03-01 — End: 2023-04-04

## 2023-03-01 NOTE — Telephone Encounter (Signed)
Meds ordered this encounter  Medications   HYDROcodone-acetaminophen (NORCO/VICODIN) 5-325 MG tablet    Sig: Take 1 tablet by mouth every 12 (twelve) hours as needed for moderate pain.    Dispense:  60 tablet    Refill:  0    

## 2023-03-01 NOTE — Telephone Encounter (Signed)
Please call pt and inform her that she is OVERDUE for her follow up appointment for pain management. She was to have come in back on 12/26/2022 for this.

## 2023-03-22 DIAGNOSIS — N3020 Other chronic cystitis without hematuria: Secondary | ICD-10-CM | POA: Diagnosis not present

## 2023-03-22 DIAGNOSIS — R350 Frequency of micturition: Secondary | ICD-10-CM | POA: Diagnosis not present

## 2023-03-25 ENCOUNTER — Other Ambulatory Visit: Payer: Self-pay | Admitting: Family Medicine

## 2023-03-25 DIAGNOSIS — G3184 Mild cognitive impairment, so stated: Secondary | ICD-10-CM

## 2023-04-04 ENCOUNTER — Encounter: Payer: Self-pay | Admitting: Family Medicine

## 2023-04-04 ENCOUNTER — Ambulatory Visit (INDEPENDENT_AMBULATORY_CARE_PROVIDER_SITE_OTHER): Payer: Medicare HMO | Admitting: Family Medicine

## 2023-04-04 VITALS — BP 137/86 | HR 68 | Wt 144.0 lb

## 2023-04-04 DIAGNOSIS — M542 Cervicalgia: Secondary | ICD-10-CM

## 2023-04-04 DIAGNOSIS — G500 Trigeminal neuralgia: Secondary | ICD-10-CM | POA: Diagnosis not present

## 2023-04-04 DIAGNOSIS — M5136 Other intervertebral disc degeneration, lumbar region: Secondary | ICD-10-CM

## 2023-04-04 DIAGNOSIS — E039 Hypothyroidism, unspecified: Secondary | ICD-10-CM | POA: Diagnosis not present

## 2023-04-04 DIAGNOSIS — G3184 Mild cognitive impairment, so stated: Secondary | ICD-10-CM | POA: Diagnosis not present

## 2023-04-04 DIAGNOSIS — M51369 Other intervertebral disc degeneration, lumbar region without mention of lumbar back pain or lower extremity pain: Secondary | ICD-10-CM

## 2023-04-04 DIAGNOSIS — G8929 Other chronic pain: Secondary | ICD-10-CM

## 2023-04-04 DIAGNOSIS — E559 Vitamin D deficiency, unspecified: Secondary | ICD-10-CM

## 2023-04-04 MED ORDER — HYDROCODONE-ACETAMINOPHEN 5-325 MG PO TABS
1.0000 | ORAL_TABLET | Freq: Two times a day (BID) | ORAL | 0 refills | Status: DC | PRN
Start: 2023-06-02 — End: 2023-11-16

## 2023-04-04 MED ORDER — DONEPEZIL HCL 10 MG PO TBDP
10.0000 mg | ORAL_TABLET | Freq: Every day | ORAL | 1 refills | Status: DC
Start: 2023-04-04 — End: 2023-09-29

## 2023-04-04 MED ORDER — HYDROCODONE-ACETAMINOPHEN 5-325 MG PO TABS
ORAL_TABLET | ORAL | 0 refills | Status: DC
Start: 2023-05-04 — End: 2023-09-05

## 2023-04-04 MED ORDER — HYDROCODONE-ACETAMINOPHEN 5-325 MG PO TABS
1.0000 | ORAL_TABLET | Freq: Two times a day (BID) | ORAL | 0 refills | Status: DC | PRN
Start: 2023-04-04 — End: 2023-07-19

## 2023-04-04 NOTE — Assessment & Plan Note (Addendum)
Doing well on current regimen.  Last TSH checked by Dr. Abbe Amsterdam was at goal.

## 2023-04-04 NOTE — Assessment & Plan Note (Addendum)
To give a urine sample today.  Will have her come back this week.  It is pertinent that we collect this as she is well overdue.  She was unable to give sample last time and did not return to do so.  Indication for chronic opioid: DDD Cervical and lumbar Medication and dose: Hydrocodone 5/325 # pills per month: 60    Last UDS date:  12/19/21 Opioid Treatment Agreement signed (Y/N): Y, 06/24/22 N[facility] reviewed this encounter (include red flags):  Yes   Follow up: 3 months.

## 2023-04-04 NOTE — Assessment & Plan Note (Addendum)
She has tolerated the 5 mg donepezil well so we will increase to 10 mg which is the therapeutic dose. To repeat MoCA at next office visit in November.

## 2023-04-04 NOTE — Progress Notes (Signed)
Established Patient Office Visit  Subjective   Patient ID: [name], female    DOB: 1945-09-16  Age: 77 y.o. MRN: [id number]  Chief Complaint  Patient presents with   Pain Management    HPI  F/u chronic pain management -she is doing well on her medication she says most days she just takes it once a day sometimes more often.  She does feel like it still effective for her pain.  She was unable to give a urine sample today. No constipation. No other side effects from the medication.    Recent vit D levels performed by oncology were low.  She started a supplement and repeat levels in June were improvings.   Hx of metastatic BrCa. Follow with Dr. Abbe Amsterdam.      ROS    Objective:     BP 137/86   Pulse 68   Wt 144 lb (65.3 kg)   SpO2 94%   BMI 23.24 kg/m    Physical Exam Vitals and nursing note reviewed.  Constitutional:      Appearance: She is well-developed.  HENT:     Head: Normocephalic and atraumatic.  Cardiovascular:     Rate and Rhythm: Normal rate and regular rhythm.     Heart sounds: Normal heart sounds.  Pulmonary:     Effort: Pulmonary effort is normal.     Breath sounds: Normal breath sounds.  Skin:    General: Skin is warm and dry.  Neurological:     Mental Status: She is alert and oriented to person, place, and time.  Psychiatric:        Behavior: Behavior normal.      No results found for any visits on 04/04/23.    The 10-year ASCVD risk score (Arnett DK, et al., 2019) is: 26.7%    Assessment & Plan:   Problem List Items Addressed This Visit       Endocrine   Hypothyroidism    Doing well on current regimen.  Last TSH checked by Dr. Abbe Amsterdam was at goal.        Nervous and Auditory   NEURALGIA, TRIGEMINAL   Relevant Medications   HYDROcodone-acetaminophen (NORCO/VICODIN) 5-325 MG tablet (Start on 05/04/2023)   HYDROcodone-acetaminophen (NORCO/VICODIN) 5-325 MG tablet (Start on 06/02/2023)   HYDROcodone-acetaminophen  (NORCO/VICODIN) 5-325 MG tablet   donepezil (ARICEPT ODT) 10 MG disintegrating tablet     Musculoskeletal and Integument   Degenerative disc disease, lumbar   Relevant Medications   HYDROcodone-acetaminophen (NORCO/VICODIN) 5-325 MG tablet (Start on 05/04/2023)   HYDROcodone-acetaminophen (NORCO/VICODIN) 5-325 MG tablet (Start on 06/02/2023)   HYDROcodone-acetaminophen (NORCO/VICODIN) 5-325 MG tablet     Other   NECK PAIN   Relevant Medications   HYDROcodone-acetaminophen (NORCO/VICODIN) 5-325 MG tablet (Start on 05/04/2023)   HYDROcodone-acetaminophen (NORCO/VICODIN) 5-325 MG tablet (Start on 06/02/2023)   HYDROcodone-acetaminophen (NORCO/VICODIN) 5-325 MG tablet   Mild cognitive impairment    She has tolerated the 5 mg donepezil well so we will increase to 10 mg which is the therapeutic dose. To repeat MoCA at next office visit in November.       Relevant Medications   donepezil (ARICEPT ODT) 10 MG disintegrating tablet   Encounter for chronic pain management - Primary    To give a urine sample today.  Will have her come back this week.  It is pertinent that we collect this as she is well overdue.  She was unable to give sample last time and did not return to do so.  Indication for chronic opioid: DDD Cervical and lumbar Medication and dose: Hydrocodone 5/325 # pills per month: 60    Last UDS date:  12/19/21 Opioid Treatment Agreement signed (Y/N): Y, 06/24/22 NCCS[facility] reviewed this encounter (include red flags):  Yes   Follow up: 3 months.        Relevant Medications   HYDROcodone-acetaminophen (NORCO/VICODIN) 5-325 MG tablet (Start on 05/04/2023)   HYDROcodone-acetaminophen (NORCO/VICODIN) 5-325 MG tablet (Start on 06/02/2023)   HYDROcodone-acetaminophen (NORCO/VICODIN) 5-325 MG tablet   Other Visit Diagnoses     Vitamin D deficiency           Return in about 2 days (around 04/06/2023) for come to lab for UDS  .    [name], MD

## 2023-04-07 ENCOUNTER — Other Ambulatory Visit: Payer: Self-pay | Admitting: *Deleted

## 2023-04-07 DIAGNOSIS — G8929 Other chronic pain: Secondary | ICD-10-CM

## 2023-04-21 ENCOUNTER — Other Ambulatory Visit: Payer: Self-pay | Admitting: Sports Medicine

## 2023-04-21 DIAGNOSIS — E782 Mixed hyperlipidemia: Secondary | ICD-10-CM

## 2023-05-15 ENCOUNTER — Other Ambulatory Visit: Payer: Self-pay | Admitting: Sports Medicine

## 2023-05-15 DIAGNOSIS — E782 Mixed hyperlipidemia: Secondary | ICD-10-CM

## 2023-05-15 NOTE — Telephone Encounter (Signed)
To PCP

## 2023-05-29 DIAGNOSIS — Z170 Estrogen receptor positive status [ER+]: Secondary | ICD-10-CM | POA: Diagnosis not present

## 2023-05-29 DIAGNOSIS — E559 Vitamin D deficiency, unspecified: Secondary | ICD-10-CM | POA: Diagnosis not present

## 2023-05-29 DIAGNOSIS — C771 Secondary and unspecified malignant neoplasm of intrathoracic lymph nodes: Secondary | ICD-10-CM | POA: Diagnosis not present

## 2023-05-29 DIAGNOSIS — M810 Age-related osteoporosis without current pathological fracture: Secondary | ICD-10-CM | POA: Diagnosis not present

## 2023-05-29 DIAGNOSIS — C50512 Malignant neoplasm of lower-outer quadrant of left female breast: Secondary | ICD-10-CM | POA: Diagnosis not present

## 2023-06-23 ENCOUNTER — Other Ambulatory Visit: Payer: Self-pay | Admitting: Family Medicine

## 2023-06-23 DIAGNOSIS — G3184 Mild cognitive impairment, so stated: Secondary | ICD-10-CM

## 2023-06-28 ENCOUNTER — Ambulatory Visit: Payer: Medicare HMO | Admitting: Family Medicine

## 2023-06-28 ENCOUNTER — Encounter: Payer: Self-pay | Admitting: Family Medicine

## 2023-06-28 VITALS — Ht 67.0 in | Wt 135.0 lb

## 2023-06-28 DIAGNOSIS — Z0000 Encounter for general adult medical examination without abnormal findings: Secondary | ICD-10-CM

## 2023-06-28 NOTE — Progress Notes (Signed)
Subjective:   [name] is a 77 y.o. female who presents for Medicare Annual (Subsequent) preventive examination.  Visit Complete: Virtual I connected with  Salina April on 06/28/23 by a audio enabled telemedicine application and verified that I am speaking with the correct person using two identifiers.  Patient Location: Home  Provider Location: Office/Clinic  I discussed the limitations of evaluation and management by telemedicine. The patient expressed understanding and agreed to proceed.  Vital Signs: Because this visit was a virtual/telehealth visit, some criteria may be missing or patient reported. Any vitals not documented were not able to be obtained and vitals that have been documented are patient reported.  Patient Medicare AWV questionnaire was completed by the patient on 06/28/23; I have confirmed that all information answered by patient is correct and no changes since this date.  Cardiac Risk Factors include: advanced age (>104men, >68 women)     Objective:    Today's Vitals   06/28/23 0921  Weight: 135 lb (61.2 kg)  Height: 5\' 7"  (1.702 m)   Body mass index is 21.14 kg/m.     06/28/2023    9:34 AM 06/10/2022   11:03 AM 05/12/2021    5:07 PM 01/31/2017    1:06 PM 08/01/2016   10:46 AM 02/01/2016   12:56 PM 08/03/2015    1:47 PM  Advanced Directives  Does Patient Have a Medical Advance Directive? No No Yes No No No No  Type of Advance Directive   Healthcare Power of Attorney      Does patient want to make changes to medical advance directive? No - Patient declined        Copy of Healthcare Power of Attorney in Chart?   No - copy requested      Would patient like information on creating a medical advance directive? No - Patient declined No - Patient declined    Yes - Educational materials given No - patient declined information    Current Medications (verified) Outpatient Encounter Medications as of 06/28/2023  Medication Sig   carbamazepine (TEGRETOL XR)  100 MG 12 hr tablet Take 100 mg by mouth 2 (two) times daily.   tamoxifen (NOLVADEX) 20 MG tablet Take 1 tablet (20 mg total) by mouth daily.   Calcium-Magnesium-Vitamin D (CALCIUM 1200+D3 PO) Take by mouth daily.  (Patient not taking: Reported on 06/28/2023)   denosumab (PROLIA) 60 MG/ML SOLN injection Inject 60 mg into the skin every 6 (six) months. Administer in upper arm, thigh, or abdomen (Patient not taking: Reported on 06/28/2023)   donepezil (ARICEPT ODT) 10 MG disintegrating tablet Take 1 tablet (10 mg total) by mouth at bedtime. (Patient not taking: Reported on 06/28/2023)   dorzolamide-timolol (COSOPT) 22.3-6.8 MG/ML ophthalmic solution 1 drop 2 (two) times daily. (Patient not taking: Reported on 06/28/2023)   fish oil-omega-3 fatty acids 1000 MG capsule Take 2 g by mouth daily. (Patient not taking: Reported on 06/28/2023)   fluticasone (FLONASE) 50 MCG/ACT nasal spray Place 2 sprays into both nostrils daily. (Patient not taking: Reported on 06/28/2023)   HYDROcodone-acetaminophen (NORCO/VICODIN) 5-325 MG tablet TAKE 1 TABLET EVERY TWELVE HOURS AS NEEDEDT (Patient not taking: Reported on 06/28/2023)   HYDROcodone-acetaminophen (NORCO/VICODIN) 5-325 MG tablet Take 1 tablet by mouth every 12 (twelve) hours as needed for moderate pain. (Patient not taking: Reported on 06/28/2023)   HYDROcodone-acetaminophen (NORCO/VICODIN) 5-325 MG tablet Take 1 tablet by mouth every 12 (twelve) hours as needed for moderate pain. (Patient not taking: Reported on 06/28/2023)  latanoprost (XALATAN) 0.005 % ophthalmic solution  (Patient not taking: Reported on 06/28/2023)   levocetirizine (XYZAL) 5 MG tablet Take 5 mg by mouth daily. (Patient not taking: Reported on 06/28/2023)   levothyroxine (SYNTHROID) 88 MCG tablet Take 1 tablet (88 mcg total) by mouth daily. (Patient not taking: Reported on 06/28/2023)   omeprazole (PRILOSEC) 40 MG capsule TAKE 1 CAPSULE BY MOUTH EVERY DAY (Patient not taking: Reported on  06/28/2023)   PREMARIN vaginal cream INERT 1 APPLICATORFUL VAGINALLY NIGHTLY 1 WEEK & THEN 3 NIGHTS A WEEK THEREAFTER (Patient not taking: Reported on 06/28/2023)   rosuvastatin (CRESTOR) 20 MG tablet TAKE 1 TABLET BY MOUTH DAILY (Patient not taking: Reported on 06/28/2023)   senna-docusate (SENOKOT-S) 8.6-50 MG tablet Take 2 tablets by mouth at bedtime. (Patient not taking: Reported on 06/28/2023)   trimethoprim (TRIMPEX) 100 MG tablet Take 100 mg by mouth daily. (Patient not taking: Reported on 06/28/2023)   No facility-administered encounter medications on file as of 06/28/2023.    Allergies (verified) Simvastatin, Azithromycin, Doxycycline, Other, Trazodone and nefazodone, Ampicillin, Cefadroxil, and Dicloxacillin   History: Past Medical History:  Diagnosis Date   Breast cancer (HCC) 08/29/1997   T1N0 LEFT BREAST   Cancer (HCC) 03/29/2006   METASTATIC BREAST TO APPARENT ISOLATED LEFT INTERNAL MAMMARY NODE   Degenerative disc disease    History of bladder infections    RECURRENT   Hyperlipidemia    Osteoporosis 03/04/2011    LAST BONE DENSITY 03-04-2011   Personal history of chemotherapy    Recurrent sinusitis    Thyroid disease    Trigeminal neuralgia    Past Surgical History:  Procedure Laterality Date   ABDOMINAL HYSTERECTOMY  2005   Complete   BLADDER SUSPENSION  2005   BREAST LUMPECTOMY Left 1999   malignant   BREAST SURGERY  1999   Lumpectomy L Br   BREAST SURGERY  2007   Lymph Node Removal L Chest Wall   cancerous LN removed  2008   left breast lumpectomy  1999   Family History  Problem Relation Age of Onset   Cancer Mother        breast   Breast cancer Mother    Stroke Mother    Hyperlipidemia Mother    Cancer Father        bladder   Colon cancer Father    Breast cancer Maternal Aunt    Cancer Maternal Aunt        Breast   Stomach cancer Paternal Uncle    Stomach cancer Paternal Uncle    Cancer Paternal Grandmother        Mouth   Social History    Socioeconomic History   Marital status: Married    Spouse name: Dorene Sorrow    Number of children: 3   Years of education: RN   Highest education level: Not on file  Occupational History    Comment: Retired- Charity fundraiser  Tobacco Use   Smoking status: Never   Smokeless tobacco: Never  Vaping Use   Vaping status: Never Used  Substance and Sexual Activity   Alcohol use: No    Alcohol/week: 0.0 standard drinks of alcohol   Drug use: No   Sexual activity: Not on file  Other Topics Concern   Not on file  Social History Narrative   Patient lives at home with her husband Dorene Sorrow)   Retired - RN   Right handed.   Caffeine two cups coffee daily and one sweet tea.   Social Determinants of  Health   Financial Resource Strain: Low Risk  (06/28/2023)   Overall Financial Resource Strain (CARDIA)    Difficulty of Paying Living Expenses: Not hard at all  Food Insecurity: No Food Insecurity (06/28/2023)   Hunger Vital Sign    Worried About Running Out of Food in the Last Year: Never true    Ran Out of Food in the Last Year: Never true  Transportation Needs: No Transportation Needs (06/28/2023)   PRAPARE - Administrator, Civil Service (Medical): No    Lack of Transportation (Non-Medical): No  Physical Activity: Insufficiently Active (06/28/2023)   Exercise Vital Sign    Days of Exercise per Week: 7 days    Minutes of Exercise per Session: 10 min  Stress: No Stress Concern Present (06/28/2023)   [employer] of Occupational Health - Occupational Stress Questionnaire    Feeling of Stress : Not at all  Social Connections: Moderately Integrated (06/28/2023)   Social Connection and Isolation Panel [NHANES]    Frequency of Communication with Friends and Family: More than three times a week    Frequency of Social Gatherings with Friends and Family: More than three times a week    Attends Religious Services: More than 4 times per year    Active Member of [employer] or Organizations: No     Attends Engineer, structural: Never    Marital Status: Married    Tobacco Counseling Counseling given: Not Answered   Clinical Intake:  Pre-visit preparation completed: No  Pain : No/denies pain     BMI - recorded: 21.1 Nutritional Status: BMI of 19-24  Normal Nutritional Risks: None Diabetes: No  How often do you need to have someone help you when you read instructions, pamphlets, or other written materials from your doctor or pharmacy?: 1 - Never  Interpreter Needed?: No      Activities of Daily Living    06/28/2023    9:23 AM  In your present state of health, do you have any difficulty performing the following activities:  Hearing? 0  Vision? 0  Difficulty concentrating or making decisions? 0  Walking or climbing stairs? 0  Dressing or bathing? 0  Doing errands, shopping? 0  Preparing Food and eating ? N  Using the Toilet? N  In the past six months, have you accidently leaked urine? N  Do you have problems with loss of bowel control? N  Managing your Medications? N  Managing your Finances? N  Housekeeping or managing your Housekeeping? N    Patient Care Team: Agapito Games, MD as PCP - General Reece Packer, MD as Consulting Physician (Hematology and Oncology) Newman Pies, MD as Consulting Physician (Otolaryngology) Alfredo Martinez, MD as Consulting Physician (Urology)  Indicate any recent Medical Services you may have received from other than [facility] providers in the past year (date may be approximate).     Assessment:   This is a routine wellness examination for [place].  Hearing/Vision screen Hearing Screening - Comments:: Unable to obtain Vision Screening - Comments:: Unable to obtain   Goals Addressed             This Visit's Progress    Mobility and Independence Optimized       Evidence-based guidance:  Refer to physical therapy or occupational therapy for assessment and individualized program.  Provide therapy that may  include functional task training, balance, active or passive exercise, spasticity management, assistive device training, cardiorespiratory fitness, Pilates and telerehabilitation services.  Identify barriers  to participation in therapy or exercise such as pain with activity, anticipated or imagined pain, transportation, depression or fear.  Work with patient and family to develop self-management plan to remove barriers.  Refer to speech/language pathologist to assess and treat speech/language deficit and swallowing impairment.  Periodically review ability to perform activities of daily living and required amount of assistance needed for safety, optimal independence and self-care.  Encourage appropriate vocational or educational counseling for re-entering the community, workplace or school; consider a driving evaluation.  Assist patient to advocate for necessary adaptations to the work or school environment.  Refer to employer for information about FMLA (Family and Medical Leave Act), Short or Long-Term Disability and options for adjustments to work schedule, assignment or hours.   Notes:       Depression Screen    06/28/2023    9:37 AM 06/28/2023    9:32 AM 10/28/2022    2:31 PM 06/10/2022   11:03 AM 03/24/2022    1:27 PM 05/12/2021    5:06 PM 03/19/2021    3:01 PM  PHQ 2/9 Scores  PHQ - 2 Score 0 0 0 0 0 0 0    Fall Risk    06/28/2023    9:36 AM 10/28/2022    2:31 PM 06/10/2022   11:03 AM 03/24/2022    1:27 PM 09/23/2021    2:26 PM  Fall Risk   Falls in the past year? 0 0 0 0 0  Number falls in past yr: 0 0 0 0 0  Injury with Fall? 0 0 0 0 0  Risk for fall due to : No Fall Risks No Fall Risks No Fall Risks No Fall Risks No Fall Risks  Follow up  Falls evaluation completed Falls evaluation completed Falls prevention discussed Falls prevention discussed;Falls evaluation completed    MEDICARE RISK AT HOME: Medicare Risk at Home Any stairs in or around the home?: Yes If so, are there  any without handrails?: Yes Home free of loose throw rugs in walkways, pet beds, electrical cords, etc?: Yes Adequate lighting in your home to reduce risk of falls?: Yes Life alert?: No Use of a cane, walker or w/c?: No Grab bars in the bathroom?: No Shower chair or bench in shower?: No Elevated toilet seat or a handicapped toilet?: No  TIMED UP AND GO:  Was the test performed?  No    Cognitive Function:    12/23/2021    2:19 PM 11/19/2019    2:29 PM 07/10/2019   11:14 AM 04/09/2019   11:00 AM  MMSE - Mini Mental State Exam  Orientation to time 1 5 2 2   Orientation to Place 3 4 3 5   Registration 3 3 3 3   Attention/ Calculation 5 5 4 5   Recall 0 2 1 3   Language- name 2 objects 2 1 2 2   Language- repeat 1 1 1 1   Language- follow 3 step command 3 2 3 3   Language- read & follow direction 1 1 1 1   Write a sentence 1 1 1 1   Copy design 1 1 1 1   Total score 21 26 22 27         06/28/2023    9:37 AM 06/10/2022   11:09 AM 05/12/2021    5:12 PM 04/04/2017    1:00 PM  6CIT Screen  What Year? 4 points 4 points 0 points 0 points  What month? 0 points 0 points 3 points 0 points  What time? 0 points 0 points 0  points 0 points  Count back from 20 0 points 0 points 0 points 0 points  Months in reverse 4 points 0 points 0 points 2 points  Repeat phrase 8 points 4 points 10 points 8 points  Total Score 16 points 8 points 13 points 10 points    Immunizations Immunization History  Administered Date(s) Administered   Fluad Quad(high Dose 65+) 04/29/2019, 06/08/2020   Influenza Whole 04/21/2011   Influenza, High Dose Seasonal PF 06/07/2018, 05/17/2021   Influenza,inj,Quad PF,6+ Mos 05/27/2013, 04/21/2014, 05/25/2015, 05/25/2017   Influenza-Unspecified 05/16/2016   Moderna Sars-Covid-2 Vaccination 10/07/2019, 10/28/2019   PNEUMOCOCCAL CONJUGATE-20 03/24/2022   Pfizer Covid-19 Vaccine Bivalent Booster 26yrs & up 06/02/2021   Pneumococcal Conjugate-13 11/13/2013   Pneumococcal  Polysaccharide-23 01/28/2015   Tdap 09/13/2011, 04/29/2022   Zoster Recombinant(Shingrix) 03/19/2021, 05/12/2021    TDAP status: Up to date  Flu Vaccine status: Due, Education has been provided regarding the importance of this vaccine. Advised may receive this vaccine at local pharmacy or Health Dept. Aware to provide a copy of the vaccination record if obtained from local pharmacy or Health Dept. Verbalized acceptance and understanding.  Pneumococcal vaccine status: Up to date  Covid-19 vaccine status: Declined, Education has been provided regarding the importance of this vaccine but patient still declined. Advised may receive this vaccine at local pharmacy or Health Dept.or vaccine clinic. Aware to provide a copy of the vaccination record if obtained from local pharmacy or Health Dept. Verbalized acceptance and understanding.  Qualifies for Shingles Vaccine? Yes   Zostavax completed Yes   Shingrix Completed?: Yes  Screening Tests Health Maintenance  Topic Date Due   INFLUENZA VACCINE  03/30/2023   COVID-19 Vaccine (4 - 2023-24 season) 04/30/2023   Hepatitis C Screening  08/28/2024 (Originally 10/21/1963)   Medicare Annual Wellness (AWV)  06/27/2024   DEXA SCAN  10/05/2024   DTaP/Tdap/Td (3 - Td or Tdap) 04/29/2032   Pneumonia Vaccine 77+ Years old  Completed   Zoster Vaccines- Shingrix  Completed   HPV VACCINES  Aged Out   Colonoscopy  Discontinued   Fecal DNA (Cologuard)  Discontinued    Health Maintenance  Health Maintenance Due  Topic Date Due   INFLUENZA VACCINE  03/30/2023   COVID-19 Vaccine (4 - 2023-24 season) 04/30/2023    Colorectal cancer screening: No longer required.   Mammogram status: No longer required due to age.  Bone Density status: Completed 10/05/22. Results reflect: Bone density results: OSTEOPOROSIS. Repeat every 2 years.  Lung Cancer Screening: (Low Dose CT Chest recommended if Age 33-80 years, 20 pack-year currently smoking OR have quit w/in  15years.) does not qualify.   Lung Cancer Screening Referral: not needed  Additional Screening:  Hepatitis C Screening: does qualify; Completed 04/04/23  Vision Screening: Recommended annual ophthalmology exams for early detection of glaucoma and other disorders of the eye. Is the patient up to date with their annual eye exam?   Who is the provider or what is the name of the office in which the patient attends annual eye exams? In [place], cannot remember name  If pt is not established with a provider, would they like to be referred to a provider to establish care? No .   Dental Screening: Recommended annual dental exams for proper oral hygiene  Diabetic Foot Exam: not diabetic   Community Resource Referral / Chronic Care Management: CRR required this visit?  No   CCM required this visit?  No     Plan:     I  have personally reviewed and noted the following in the patient's chart:   Medical and social history Use of alcohol, tobacco or illicit drugs  Current medications and supplements including opioid prescriptions. Patient is not currently taking opioid prescriptions. Functional ability and status Nutritional status Physical activity Advanced directives List of other physicians Hospitalizations, surgeries, and ER visits in previous 12 months Vitals Screenings to include cognitive, depression, and falls Referrals and appointments  In addition, I have reviewed and discussed with patient certain preventive protocols, quality metrics, and best practice recommendations. A written personalized care plan for preventive services as well as general preventive health recommendations were provided to patient.     Novella Olive, FNP   06/28/2023   After Visit Summary: (Declined) Due to this being a telephonic visit, with patients personalized plan was offered to patient but patient Declined AVS at this time

## 2023-07-06 ENCOUNTER — Telehealth: Payer: Self-pay | Admitting: Family Medicine

## 2023-07-06 ENCOUNTER — Encounter: Payer: Self-pay | Admitting: Family Medicine

## 2023-07-06 ENCOUNTER — Ambulatory Visit (INDEPENDENT_AMBULATORY_CARE_PROVIDER_SITE_OTHER): Payer: Medicare HMO | Admitting: Family Medicine

## 2023-07-06 ENCOUNTER — Telehealth: Payer: Self-pay | Admitting: *Deleted

## 2023-07-06 VITALS — BP 134/81 | HR 107 | Ht 66.0 in | Wt 148.0 lb

## 2023-07-06 DIAGNOSIS — G8929 Other chronic pain: Secondary | ICD-10-CM

## 2023-07-06 DIAGNOSIS — F02C0 Dementia in other diseases classified elsewhere, severe, without behavioral disturbance, psychotic disturbance, mood disturbance, and anxiety: Secondary | ICD-10-CM

## 2023-07-06 DIAGNOSIS — G301 Alzheimer's disease with late onset: Secondary | ICD-10-CM

## 2023-07-06 DIAGNOSIS — Z23 Encounter for immunization: Secondary | ICD-10-CM | POA: Diagnosis not present

## 2023-07-06 DIAGNOSIS — G500 Trigeminal neuralgia: Secondary | ICD-10-CM

## 2023-07-06 MED ORDER — MEMANTINE HCL 5 MG PO TABS: 5.0000 mg | ORAL_TABLET | Freq: Two times a day (BID) | ORAL | 2 refills | Status: DC

## 2023-07-06 NOTE — Telephone Encounter (Signed)
Spoke w/pt's husband and advised him that he will need to bring her by and have her do a UDS within the week because she hasn't had this done in a year and Dr. Linford Arnold cannot continue to write her pain medications for her if she doesn't have an UTD urine. He voiced understanding and agreed.

## 2023-07-06 NOTE — Addendum Note (Signed)
Addended by: Deno Etienne on: 07/06/2023 04:59 PM   Modules accepted: Orders

## 2023-07-06 NOTE — Progress Notes (Signed)
   Established Patient Office Visit  Subjective   Patient ID: [name], female    DOB: Jan 06, 1946  Age: 77 y.o. MRN: [id number]  Chief Complaint  Patient presents with   memory testing    HPI  She is here today to follow-up on mild cognitive impairment we had planned to do a MoCA today.  Reports that she is taking Aricept but when she did her Medicare wellness exam about a week ago she had reported that she was not taking those medications.  Hypothyroidism - Taking medication regularly in the AM away from food and vitamins, etc. No recent change to skin, hair, or energy levels.  She is also here for chronic pain management.  Here for f/U memory testing.     ROS    Objective:     BP 134/81   Pulse (!) 107   Ht 5\' 6"  (1.676 m)   Wt 148 lb (67.1 kg)   SpO2 98%   BMI 23.89 kg/m    Physical Exam Vitals and nursing note reviewed.  Constitutional:      Appearance: Normal appearance.  HENT:     Head: Normocephalic and atraumatic.  Eyes:     Conjunctiva/sclera: Conjunctivae normal.  Cardiovascular:     Rate and Rhythm: Normal rate and regular rhythm.  Pulmonary:     Effort: Pulmonary effort is normal.     Breath sounds: Normal breath sounds.  Skin:    General: Skin is warm and dry.  Neurological:     Mental Status: She is alert.  Psychiatric:        Mood and Affect: Mood normal.      No results found for any visits on 07/06/23.    The 10-year ASCVD risk score (Arnett DK, et al., 2019) is: 25.7%    Assessment & Plan:   Problem List Items Addressed This Visit       Nervous and Auditory   NEURALGIA, TRIGEMINAL    Down to once a day on the Tregetol and doing well.       Relevant Medications   memantine (NAMENDA) 5 MG tablet   Dementia (HCC)    MMSE: 04/17/19 - score 27/30 MMSE 07/10/2019 - score 22/30 MMSE 11/19/2019 -score of 26/30 MMSE 12/23/21 score of 20/30 MOCA 11/024:: score 8/30  Refer to Neurology for further options.  Already on  Aricept.  Will go ahead and add Namenda.      Relevant Medications   memantine (NAMENDA) 5 MG tablet     Other   Encounter for chronic pain management - Primary      Indication for chronic opioid: DDD Cervical and lumbar Medication and dose: Hydrocodone 5/325 # pills per month: 60    Last UDS date:  07/06/23 Opioid Treatment Agreement signed (Y/N): Y, 06/24/22 NCCSRS reviewed this encounter (include red flags):  Yes   Follow up: 3 months.         Return in about 3 months (around 10/06/2023) for Chronic Pain Mgt.   I spent 40 minutes on the day of the encounter to include pre-visit record review, face-to-face time with the patient and post visit ordering of test.   [name], MD

## 2023-07-06 NOTE — Assessment & Plan Note (Signed)
Down to once a day on the [facility] and doing well.

## 2023-07-06 NOTE — Assessment & Plan Note (Addendum)
MMSE: 04/17/19 - score 27/30 MMSE 07/10/2019 - score 22/30 MMSE 11/19/2019 -score of 26/30 MMSE 12/23/21 score of 20/30 MOCA 11/024:: score 8/30  Refer to Neurology for further options.  Already on Aricept.  Will go ahead and add Namenda.

## 2023-07-06 NOTE — Telephone Encounter (Signed)
Order

## 2023-07-06 NOTE — Assessment & Plan Note (Addendum)
  Indication for chronic opioid: DDD Cervical and lumbar Medication and dose: Hydrocodone 5/325 # pills per month: 60    Last UDS date:  07/06/23 Opioid Treatment Agreement signed (Y/N): Y, 06/24/22 NCCSRS reviewed this encounter (include red flags):  Yes   Follow up: 3 months.

## 2023-07-06 NOTE — Patient Instructions (Signed)
OK to stop the tegretol

## 2023-07-12 LAB — DRUG SCREEN, UR (12+OXYCODONE+CRT)
Amphetamine Scrn, Ur: NEGATIVE ng/mL
BARBITURATE SCREEN URINE: NEGATIVE ng/mL
BENZODIAZEPINE SCREEN, URINE: NEGATIVE ng/mL
CANNABINOIDS UR QL SCN: NEGATIVE ng/mL
Cocaine (Metab) Scrn, Ur: NEGATIVE ng/mL
Creatinine(Crt), U: 91.4 mg/dL (ref 20.0–300.0)
Fentanyl, Urine: NEGATIVE pg/mL
Meperidine Screen, Urine: NEGATIVE ng/mL
Methadone Screen, Urine: NEGATIVE ng/mL
OXYCODONE+OXYMORPHONE UR QL SCN: NEGATIVE ng/mL
Opiate Scrn, Ur: POSITIVE ng/mL — AB
Ph of Urine: 5.8 (ref 4.5–8.9)
Phencyclidine Qn, Ur: NEGATIVE ng/mL
Propoxyphene Scrn, Ur: NEGATIVE ng/mL
SPECIFIC GRAVITY: 1.007
Tramadol Screen, Urine: NEGATIVE ng/mL

## 2023-07-18 ENCOUNTER — Other Ambulatory Visit: Payer: Self-pay | Admitting: Family Medicine

## 2023-07-18 DIAGNOSIS — M51369 Other intervertebral disc degeneration, lumbar region without mention of lumbar back pain or lower extremity pain: Secondary | ICD-10-CM

## 2023-07-18 DIAGNOSIS — M542 Cervicalgia: Secondary | ICD-10-CM

## 2023-07-18 DIAGNOSIS — G8929 Other chronic pain: Secondary | ICD-10-CM

## 2023-07-18 DIAGNOSIS — G500 Trigeminal neuralgia: Secondary | ICD-10-CM

## 2023-07-18 NOTE — Telephone Encounter (Signed)
Prescription Request  07/18/2023  LOV: 07/06/2023  What is the name of the medication or equipment? HYDROcodone-acetaminophen (NORCO/VICODIN) 5-325 MG tablet   Have you contacted your pharmacy to request a refill? Yes   Which pharmacy would you like this sent to?   [id number] - [place], [place] - [address] [place] [address] Phone: [phone] Fax: [phone]   Patient notified that their request is being sent to the clinical staff for review and that they should receive a response within 2 business days.   Please advise at [facility] [phone]

## 2023-07-19 MED ORDER — HYDROCODONE-ACETAMINOPHEN 5-325 MG PO TABS: 1.0000 | ORAL_TABLET | Freq: Two times a day (BID) | ORAL | 0 refills | Status: DC | PRN

## 2023-09-05 ENCOUNTER — Other Ambulatory Visit: Payer: Self-pay | Admitting: Family Medicine

## 2023-09-05 DIAGNOSIS — G500 Trigeminal neuralgia: Secondary | ICD-10-CM

## 2023-09-05 DIAGNOSIS — M51369 Other intervertebral disc degeneration, lumbar region without mention of lumbar back pain or lower extremity pain: Secondary | ICD-10-CM

## 2023-09-05 DIAGNOSIS — G8929 Other chronic pain: Secondary | ICD-10-CM

## 2023-09-05 DIAGNOSIS — M542 Cervicalgia: Secondary | ICD-10-CM

## 2023-09-05 MED ORDER — HYDROCODONE-ACETAMINOPHEN 5-325 MG PO TABS
ORAL_TABLET | ORAL | 0 refills | Status: DC
Start: 2023-09-05 — End: 2023-10-10

## 2023-09-05 NOTE — Telephone Encounter (Signed)
 Meds ordered this encounter  Medications   HYDROcodone-acetaminophen (NORCO/VICODIN) 5-325 MG tablet    Sig: TAKE 1 TABLET EVERY TWELVE HOURS AS NEEDEDT    Dispense:  60 tablet    Refill:  0

## 2023-09-05 NOTE — Telephone Encounter (Signed)
 Copied from CRM [id number]. Topic: Clinical - Medication Refill >> Sep 05, 2023  2:44 PM Alfonso ORN wrote: Most Recent Primary Care Visit:  Provider: METHENEY, CATHERINE D  Department: [facility] CARE MKV  Visit Type: OFFICE VISIT  Date: 07/06/2023  Medication: HYDROcodone -acetaminophen  (NORCO/VICODIN) 5-325 MG tablet  Has the patient contacted their pharmacy? Yes , pharmacy told patient to call provider since the medication is a control substance  (Agent: If no, request that the patient contact the pharmacy for the refill. If patient does not wish to contact the pharmacy document the reason why and proceed with request.) (Agent: If yes, when and what did the pharmacy advise?)  Is this the correct pharmacy for this prescription? Yes [facility] pharmacy  If no, delete pharmacy and type the correct one.  This is the patient's preferred pharmacy:  [facility] [id number] - [place], [place] - [address] [place] [address] Phone: [phone] Fax: [phone]  [facility] - [place], [place] - [address] [place] [address] Phone: [phone] Fax: [phone]  [facility] Delivery - [place], [place] [address] [place] [address] [place] 2nd Floor [place] [address] Phone: [phone] Fax: [phone]  [facility] - [place], [place] - One [facility] AT Portal to Registered Caremark Sites One [place] [address] Phone: [phone] Fax: [phone]  [facility][id number] - [place], [place] - [address] AT [facility] [address] [place] [address] Phone: [phone] Fax: [phone]  [id number] [place], [place] - [address] [place]  [place] [address] Phone: [phone] Fax: [phone]   Has the prescription been filled recently?   Is the patient out of the medication? No has 2  tablets left   Has the patient been seen for an appointment in the last year OR does the patient have an upcoming appointment? Yes   Can we respond through MyChart?   Agent: Please be advised that Rx refills may take up to 3 business days. We ask that you follow-up with your pharmacy.

## 2023-09-28 ENCOUNTER — Other Ambulatory Visit: Payer: Self-pay | Admitting: Family Medicine

## 2023-09-28 DIAGNOSIS — G3184 Mild cognitive impairment, so stated: Secondary | ICD-10-CM

## 2023-10-10 ENCOUNTER — Encounter: Payer: Self-pay | Admitting: Family Medicine

## 2023-10-10 ENCOUNTER — Ambulatory Visit (INDEPENDENT_AMBULATORY_CARE_PROVIDER_SITE_OTHER): Payer: Medicare HMO | Admitting: Family Medicine

## 2023-10-10 VITALS — BP 124/74 | HR 109 | Ht 66.0 in | Wt 147.0 lb

## 2023-10-10 DIAGNOSIS — G8929 Other chronic pain: Secondary | ICD-10-CM | POA: Diagnosis not present

## 2023-10-10 DIAGNOSIS — M51369 Other intervertebral disc degeneration, lumbar region without mention of lumbar back pain or lower extremity pain: Secondary | ICD-10-CM | POA: Diagnosis not present

## 2023-10-10 DIAGNOSIS — M542 Cervicalgia: Secondary | ICD-10-CM | POA: Diagnosis not present

## 2023-10-10 DIAGNOSIS — G500 Trigeminal neuralgia: Secondary | ICD-10-CM | POA: Diagnosis not present

## 2023-10-10 DIAGNOSIS — E039 Hypothyroidism, unspecified: Secondary | ICD-10-CM | POA: Diagnosis not present

## 2023-10-10 DIAGNOSIS — G301 Alzheimer's disease with late onset: Secondary | ICD-10-CM | POA: Diagnosis not present

## 2023-10-10 DIAGNOSIS — F02C0 Dementia in other diseases classified elsewhere, severe, without behavioral disturbance, psychotic disturbance, mood disturbance, and anxiety: Secondary | ICD-10-CM | POA: Diagnosis not present

## 2023-10-10 MED ORDER — LEVOTHYROXINE SODIUM 88 MCG PO TABS: 88.0000 ug | ORAL_TABLET | Freq: Every day | ORAL | 1 refills | Status: DC

## 2023-10-10 MED ORDER — HYDROCODONE-ACETAMINOPHEN 5-325 MG PO TABS: ORAL_TABLET | ORAL | 0 refills | Status: DC

## 2023-10-10 NOTE — Progress Notes (Unsigned)
Established Patient Office Visit  Subjective  Patient ID: [name], female    DOB: 1945-12-03  Age: 78 y.o. MRN: [id number]  Chief Complaint  Patient presents with   Pain Management    HPI  {History (Optional):23778}  ROS    Objective:     BP 124/74   Pulse (!) 109   Ht 5\' 6"  (1.676 m)   Wt 147 lb (66.7 kg)   SpO2 96%   BMI 23.73 kg/m  {Vitals History (Optional):23777}  Physical Exam Vitals and nursing note reviewed.  Constitutional:      Appearance: Normal appearance.  HENT:     Head: Normocephalic and atraumatic.  Eyes:     Conjunctiva/sclera: Conjunctivae normal.  Cardiovascular:     Rate and Rhythm: Normal rate and regular rhythm.  Pulmonary:     Effort: Pulmonary effort is normal.     Breath sounds: Normal breath sounds.  Skin:    General: Skin is warm and dry.  Neurological:     Mental Status: She is alert.  Psychiatric:        Mood and Affect: Mood normal.      No results found for any visits on 10/10/23.  {Labs (Optional):23779}  The 10-year ASCVD risk score (Arnett DK, et al., 2019) is: 22.5%    Assessment & Plan:   Problem List Items Addressed This Visit       Endocrine   Hypothyroidism   Her husband reports that she has been out of her thyroid medication for a little over a week and needs refills.  Recommend restart medication and then we can recheck labs.  Recommend return for labs in a couple weeks.  Will go ahead and place order today.      Relevant Medications   levothyroxine (SYNTHROID) 88 MCG tablet   Other Relevant Orders   TSH   CMP14+EGFR   Lipid panel   CBC     Nervous and Auditory   NEURALGIA, TRIGEMINAL   Relevant Medications   HYDROcodone-acetaminophen (NORCO/VICODIN) 5-325 MG tablet   Dementia (HCC)   Added Namenda at last office visit in November and place referral to neurology to assist since her MoCA score had continued to decline.  Her husband reports that he has not noticed any side effects he feels  like her memory has been stable but he has not necessarily noticed any improvement.  We also placed a referral back in November but he says he never heard from anyone we had resent the referral [facility].  Will reach out and find out what happened with the referral.      Relevant Orders   TSH   CMP14+EGFR   Lipid panel   CBC     Musculoskeletal and Integument   Degenerative disc disease, lumbar   Relevant Medications   HYDROcodone-acetaminophen (NORCO/VICODIN) 5-325 MG tablet     Other   NECK PAIN   Relevant Medications   HYDROcodone-acetaminophen (NORCO/VICODIN) 5-325 MG tablet   Encounter for chronic pain management - Primary     Indication for chronic opioid: DDD Cervical and lumbar Medication and dose: Hydrocodone 5/325 # pills per month: 60    Last UDS date:  07/06/23 Opioid Treatment Agreement signed (Y/N): Y, 06/24/22 NCCSRS reviewed this encounter (include red flags):  Yes   Follow up: 3 months.          Relevant Medications   HYDROcodone-acetaminophen (NORCO/VICODIN) 5-325 MG tablet   Other Relevant Orders  TSH   CMP14+EGFR   Lipid panel   CBC    Return in about 3 months (around 01/07/2024) for Chronic Pain Mgt.    Anita Gasser, MD

## 2023-10-10 NOTE — Assessment & Plan Note (Signed)
Her husband reports that she has been out of her thyroid medication for a little over a week and needs refills.  Recommend restart medication and then we can recheck labs.  Recommend return for labs in a couple weeks.  Will go ahead and place order today.

## 2023-10-10 NOTE — Assessment & Plan Note (Signed)
Indication for chronic opioid: DDD Cervical and lumbar Medication and dose: Hydrocodone 5/325 # pills per month: 60    Last UDS date:  07/06/23 Opioid Treatment Agreement signed (Y/N): Y, 06/24/22 NCCSRS reviewed this encounter (include red flags):  Yes   Follow up: 3 months.

## 2023-10-10 NOTE — Assessment & Plan Note (Addendum)
Added Namenda at last office visit in November and place referral to neurology to assist since her MoCA score had continued to decline.  Her husband reports that he has not noticed any side effects he feels like her memory has been stable but he has not necessarily noticed any improvement.  We also placed a referral back in November but he says he never heard from anyone we had resent the referral [facility].  Will reach out and find out what happened with the referral.

## 2023-10-10 NOTE — Patient Instructions (Signed)
Please try to start decreasing the pain medication dose to half a tab as needed.

## 2023-10-11 ENCOUNTER — Telehealth: Payer: Self-pay | Admitting: Family Medicine

## 2023-10-11 NOTE — Telephone Encounter (Signed)
Contacted [facility] ( spoke with [facility]) to check the status of referral. Pt was contacted [facility] December 2024, patient refused referral per notes. Asked office to contact patients spouse Dorene Sorrow  to schedule  referral due to diagnosis.

## 2023-10-11 NOTE — Assessment & Plan Note (Signed)
I did discuss with her husband maybe trying a half a tab at times of her pain seems more mild.  If she is really uncomfortable then definitely a whole tab.  Like to see if we could start maybe tapering the medication or reducing how much she is using.  It is a little bit difficult with her current dementia because in the moment she says she is fine but then does still complain of pain at home.

## 2023-10-11 NOTE — Telephone Encounter (Signed)
He says they have not heard back about Neuro referral that was placed in November in regards to dementia.  Can you check on this?

## 2023-10-12 NOTE — Telephone Encounter (Addendum)
10/12/2023-Received incoming call from [facility], Denny Peon states she has attempted to contact Mr. Schlee this morning and Left a message on his voicemail. Denny Peon states she has made multiple attempts and has not received a call back.   I asked if we could schedule patient for an appointment but Denny Peon states she needs a return call from spouse or adult child. New patient packet has to be completed and returned before appointment can be scheduled.    CB: [phone]

## 2023-10-13 NOTE — Telephone Encounter (Signed)
Spoke with patients husband [name]), he confirmed that [name] from [facility] called him this morning. He states [name] is going to mail a packet to complete and return. Once the packet is received by [name], the referral appointment will be scheduled.

## 2023-10-13 NOTE — Telephone Encounter (Signed)
K, I think best solution at this point would be to call her husband Dorene Sorrow and give him the phone number to call them back directly and let him know that they have been trying to get in touch with them for several attempts.  I think it would be best if he just called them directly and scheduled.

## 2023-10-21 ENCOUNTER — Other Ambulatory Visit: Payer: Self-pay | Admitting: Family Medicine

## 2023-11-03 ENCOUNTER — Other Ambulatory Visit: Payer: Self-pay

## 2023-11-03 DIAGNOSIS — G301 Alzheimer's disease with late onset: Secondary | ICD-10-CM | POA: Diagnosis not present

## 2023-11-03 DIAGNOSIS — G8929 Other chronic pain: Secondary | ICD-10-CM | POA: Diagnosis not present

## 2023-11-03 DIAGNOSIS — E039 Hypothyroidism, unspecified: Secondary | ICD-10-CM | POA: Diagnosis not present

## 2023-11-03 DIAGNOSIS — F02C0 Dementia in other diseases classified elsewhere, severe, without behavioral disturbance, psychotic disturbance, mood disturbance, and anxiety: Secondary | ICD-10-CM | POA: Diagnosis not present

## 2023-11-04 LAB — CBC
Hematocrit: 41.3 % (ref 34.0–46.6)
Hemoglobin: 13.7 g/dL (ref 11.1–15.9)
MCH: 31.9 pg (ref 26.6–33.0)
MCHC: 33.2 g/dL (ref 31.5–35.7)
MCV: 96 fL (ref 79–97)
Platelets: 249 10*3/uL (ref 150–450)
RBC: 4.29 x10E6/uL (ref 3.77–5.28)
RDW: 12.7 % (ref 11.7–15.4)
WBC: 6.9 10*3/uL (ref 3.4–10.8)

## 2023-11-04 LAB — CMP14+EGFR
ALT: 18 IU/L (ref 0–32)
AST: 29 IU/L (ref 0–40)
Albumin: 4.5 g/dL (ref 3.8–4.8)
Alkaline Phosphatase: 50 IU/L (ref 44–121)
BUN/Creatinine Ratio: 16 (ref 12–28)
BUN: 16 mg/dL (ref 8–27)
Bilirubin Total: 0.3 mg/dL (ref 0.0–1.2)
CO2: 22 mmol/L (ref 20–29)
Calcium: 9.7 mg/dL (ref 8.7–10.3)
Chloride: 105 mmol/L (ref 96–106)
Creatinine, Ser: 1.02 mg/dL — ABNORMAL HIGH (ref 0.57–1.00)
Globulin, Total: 1.7 g/dL (ref 1.5–4.5)
Glucose: 94 mg/dL (ref 70–99)
Potassium: 4.2 mmol/L (ref 3.5–5.2)
Sodium: 143 mmol/L (ref 134–144)
Total Protein: 6.2 g/dL (ref 6.0–8.5)
eGFR: 57 mL/min/{1.73_m2} — ABNORMAL LOW (ref >59)

## 2023-11-04 LAB — TSH: TSH: 1.63 u[IU]/mL (ref 0.450–4.500)

## 2023-11-04 LAB — LIPID PANEL
Chol/HDL Ratio: 2.7 ratio (ref 0.0–4.4)
Cholesterol, Total: 171 mg/dL (ref 100–199)
HDL: 63 mg/dL (ref >39)
LDL Chol Calc (NIH): 75 mg/dL (ref 0–99)
Triglycerides: 198 mg/dL — ABNORMAL HIGH (ref 0–149)
VLDL Cholesterol Cal: 33 mg/dL (ref 5–40)

## 2023-11-09 LAB — DRUG SCREEN 12+ALCOHOL+CRT, UR
Amphetamines, Urine: NEGATIVE ng/mL
BENZODIAZ UR QL: NEGATIVE ng/mL
Barbiturate: NEGATIVE ng/mL
Cannabinoids: NEGATIVE ng/mL
Cocaine (Metabolite): NEGATIVE ng/mL
Creatinine, Urine: 131.9 mg/dL (ref 20.0–300.0)
Ethanol, Urine: NEGATIVE %

## 2023-11-09 LAB — SPECIMEN STATUS REPORT

## 2023-11-13 NOTE — Progress Notes (Signed)
 Call patient: Kidney function is stable.  Triglycerides are still elevated but better than last year so that is great.  Thyroid looks perfect.  Blood count is normal.

## 2023-11-16 ENCOUNTER — Other Ambulatory Visit: Payer: Self-pay | Admitting: Family Medicine

## 2023-11-16 DIAGNOSIS — G500 Trigeminal neuralgia: Secondary | ICD-10-CM

## 2023-11-16 DIAGNOSIS — M51369 Other intervertebral disc degeneration, lumbar region without mention of lumbar back pain or lower extremity pain: Secondary | ICD-10-CM

## 2023-11-16 DIAGNOSIS — G8929 Other chronic pain: Secondary | ICD-10-CM

## 2023-11-16 DIAGNOSIS — M542 Cervicalgia: Secondary | ICD-10-CM

## 2023-11-16 MED ORDER — HYDROCODONE-ACETAMINOPHEN 5-325 MG PO TABS: 1.0000 | ORAL_TABLET | Freq: Two times a day (BID) | ORAL | 0 refills | Status: DC | PRN

## 2023-11-16 MED ORDER — HYDROCODONE-ACETAMINOPHEN 5-325 MG PO TABS: ORAL_TABLET | ORAL | 0 refills | Status: DC

## 2023-11-16 NOTE — Telephone Encounter (Signed)
 Copied from CRM [id number]. Topic: Clinical - Medication Refill >> Nov 16, 2023  1:29 PM Tiffany S wrote: Most Recent Primary Care Visit:  Provider: [name] D  Department: [facility] CARE MKV  Visit Type: OFFICE VISIT  Date: 10/10/2023  Medication: HYDROcodone-acetaminophen (NORCO/VICODIN) 5-325 MG tablet [[id number]]  Has the patient contacted their pharmacy? Yes (Agent: If no, request that the patient contact the pharmacy for the refill. If patient does not wish to contact the pharmacy document the reason why and proceed with request.) (Agent: If yes, when and what did the pharmacy advise?)  Is this the correct pharmacy for this prescription? Yes If no, delete pharmacy and type the correct one.  This is the patient's preferred pharmacy:  [facility] [id number] - [place], [place] - [address] [place] [address] Phone: [phone] Fax: [phone]  [facility] Delivery - [place], [place] - [place] [address] [place] [address] [place] [address]nd Floor [place] [address] Phone: [phone] Fax: [phone]  [facility] - [place], [place] - [address] [facility] AT Portal to Registered Caremark Sites One [place] [address] Phone: [phone] Fax: [phone]  [facility] #[id number] - [place], [place] - [address] AT [facility] [address] [place] [address] Phone: [phone] Fax: [phone]  [id number] [place], [place] - [address] [place] [address] Phone: [phone] Fax: [phone]   Has the prescription been filled recently? Yes  Is the patient out of the medication? Yes  Has the patient been seen for an appointment in the last year OR does the patient have an upcoming appointment? Yes  Can we respond through MyChart? No  Agent: Please be advised that Rx refills may take up to 3 business days. We ask  that you follow-up with your pharmacy.

## 2023-11-16 NOTE — Telephone Encounter (Signed)
 Meds ordered this encounter  Medications   HYDROcodone-acetaminophen (NORCO/VICODIN) 5-325 MG tablet    Sig: Take 1 tablet by mouth every 12 (twelve) hours as needed for moderate pain (pain score 4-6).    Dispense:  60 tablet    Refill:  0   HYDROcodone-acetaminophen (NORCO/VICODIN) 5-325 MG tablet    Sig: TAKE 0.5-1 TABLET EVERY TWELVE HOURS AS NEEDEDT    Dispense:  60 tablet    Refill:  0

## 2023-11-16 NOTE — Telephone Encounter (Signed)
 Last filled 07/19/2023  Last OV 10/10/2023

## 2023-11-21 ENCOUNTER — Other Ambulatory Visit: Payer: Self-pay | Admitting: Family Medicine

## 2023-11-21 DIAGNOSIS — Z1231 Encounter for screening mammogram for malignant neoplasm of breast: Secondary | ICD-10-CM

## 2023-11-24 DIAGNOSIS — C50512 Malignant neoplasm of lower-outer quadrant of left female breast: Secondary | ICD-10-CM | POA: Diagnosis not present

## 2023-11-24 DIAGNOSIS — Z170 Estrogen receptor positive status [ER+]: Secondary | ICD-10-CM | POA: Diagnosis not present

## 2023-11-24 DIAGNOSIS — E559 Vitamin D deficiency, unspecified: Secondary | ICD-10-CM | POA: Diagnosis not present

## 2023-11-24 DIAGNOSIS — Z5181 Encounter for therapeutic drug level monitoring: Secondary | ICD-10-CM | POA: Diagnosis not present

## 2023-11-24 DIAGNOSIS — M810 Age-related osteoporosis without current pathological fracture: Secondary | ICD-10-CM | POA: Diagnosis not present

## 2023-11-24 DIAGNOSIS — M818 Other osteoporosis without current pathological fracture: Secondary | ICD-10-CM | POA: Diagnosis not present

## 2023-11-24 DIAGNOSIS — T386X5A Adverse effect of antigonadotrophins, antiestrogens, antiandrogens, not elsewhere classified, initial encounter: Secondary | ICD-10-CM | POA: Diagnosis not present

## 2023-12-15 DIAGNOSIS — F02B0 Dementia in other diseases classified elsewhere, moderate, without behavioral disturbance, psychotic disturbance, mood disturbance, and anxiety: Secondary | ICD-10-CM | POA: Diagnosis not present

## 2023-12-15 DIAGNOSIS — G301 Alzheimer's disease with late onset: Secondary | ICD-10-CM | POA: Diagnosis not present

## 2023-12-19 ENCOUNTER — Other Ambulatory Visit: Payer: Self-pay | Admitting: Family Medicine

## 2023-12-19 DIAGNOSIS — M51369 Other intervertebral disc degeneration, lumbar region without mention of lumbar back pain or lower extremity pain: Secondary | ICD-10-CM

## 2023-12-19 DIAGNOSIS — G500 Trigeminal neuralgia: Secondary | ICD-10-CM

## 2023-12-19 DIAGNOSIS — M542 Cervicalgia: Secondary | ICD-10-CM

## 2023-12-19 DIAGNOSIS — G8929 Other chronic pain: Secondary | ICD-10-CM

## 2023-12-19 NOTE — Telephone Encounter (Signed)
 Copied from CRM [id number]. Topic: Clinical - Medication Refill >> Dec 19, 2023  2:03 PM Madelyne Schiff wrote: Most Recent Primary Care Visit:  Provider: METHENEY, CATHERINE D  Department: [facility] CARE MKV  Visit Type: OFFICE VISIT  Date: 10/10/2023  Medication: HYDROcodone -acetaminophen  (NORCO/VICODIN) 5-325 MG tablet  Has the patient contacted their pharmacy? no (Agent: If no, request that the patient contact the pharmacy for the refill. If patient does not wish to contact the pharmacy document the reason why and proceed with request.) (Agent: If yes, when and what did the pharmacy advise?)  Is this the correct pharmacy for this prescription? Yes If no, delete pharmacy and type the correct one.  This is the patient's preferred pharmacy:  [facility] [id number] - [place], [place] - [address] [place] [address] Phone: [phone] Fax: [phone]     Is the patient out of the medication? No  1 pill left   Has the patient been seen for an appointment in the last year OR does the patient have an upcoming appointment? Yes  Can we respond through MyChart? No  Agent: Please be advised that Rx refills may take up to 3 business days. We ask that you follow-up with your pharmacy.

## 2023-12-20 NOTE — Telephone Encounter (Signed)
 Please call patient: She already has a prescription on file for April she can pick it up as early as the 19th which was a couple of days ago.

## 2023-12-21 NOTE — Telephone Encounter (Signed)
 Her husband picked up the prescription yesterday.

## 2023-12-27 ENCOUNTER — Ambulatory Visit

## 2023-12-27 DIAGNOSIS — Z1231 Encounter for screening mammogram for malignant neoplasm of breast: Secondary | ICD-10-CM | POA: Diagnosis not present

## 2023-12-28 NOTE — Progress Notes (Signed)
 Call patient: Mammogram shows questionable area in the left breast they will be contacting her to schedule additional imaging.

## 2023-12-29 ENCOUNTER — Other Ambulatory Visit: Payer: Self-pay | Admitting: Family Medicine

## 2023-12-29 DIAGNOSIS — R928 Other abnormal and inconclusive findings on diagnostic imaging of breast: Secondary | ICD-10-CM

## 2024-01-01 ENCOUNTER — Ambulatory Visit

## 2024-01-01 ENCOUNTER — Ambulatory Visit
Admission: RE | Admit: 2024-01-01 | Discharge: 2024-01-01 | Disposition: A | Discharge status: Home / Self Care | Source: Ambulatory Visit | Attending: Family Medicine | Admitting: Family Medicine

## 2024-01-01 DIAGNOSIS — R928 Other abnormal and inconclusive findings on diagnostic imaging of breast: Secondary | ICD-10-CM

## 2024-01-15 ENCOUNTER — Encounter

## 2024-01-15 ENCOUNTER — Other Ambulatory Visit

## 2024-01-18 ENCOUNTER — Other Ambulatory Visit: Payer: Self-pay | Admitting: Family Medicine

## 2024-01-18 DIAGNOSIS — G500 Trigeminal neuralgia: Secondary | ICD-10-CM

## 2024-01-18 DIAGNOSIS — M51369 Other intervertebral disc degeneration, lumbar region without mention of lumbar back pain or lower extremity pain: Secondary | ICD-10-CM

## 2024-01-18 DIAGNOSIS — G8929 Other chronic pain: Secondary | ICD-10-CM

## 2024-01-18 DIAGNOSIS — M542 Cervicalgia: Secondary | ICD-10-CM

## 2024-01-18 NOTE — Telephone Encounter (Signed)
 Copied from CRM [id number]. Topic: Clinical - Medication Refill >> Jan 18, 2024  8:42 AM Lenon Radar A wrote: Medication: HYDROcodone -acetaminophen  (NORCO/VICODIN) 5-325 MG tablet  Has the patient contacted their pharmacy? Yes (Agent: If no, request that the patient contact the pharmacy for the refill. If patient does not wish to contact the pharmacy document the reason why and proceed with request.) (Agent: If yes, when and what did the pharmacy advise?)  This is the patient's preferred pharmacy:  [facility] [id number] - [place], [place] - [address] [place] [address] Phone: [phone] Fax: [phone]  [facility] Delivery - [place], [place] - [place] [address] [place] [address] [place] 2nd Floor [place] [address] Phone: [phone] Fax: [phone]  [facility] - [place], [place] - One [facility] AT Portal to Registered Caremark Sites One [place] [address] Phone: [phone] Fax: [phone]  [facility] #[id number] - [place], [place] - [address] AT [facility] [address] [place] [address] Phone: [phone] Fax: [phone]  [id number] - [place], [place] - [address] [place] [address] Phone: [phone] Fax: [phone]  Is this the correct pharmacy for this prescription? Yes If no, delete pharmacy and type the correct one.   Has the prescription been filled recently? No  Is the patient out of the medication? Yes  Has the patient been seen for an appointment in the last year OR does the patient have an upcoming appointment? Yes  Can we respond through MyChart? Yes  Agent: Please be advised that Rx refills may take up to 3 business days. We ask that you follow-up with your pharmacy.

## 2024-01-18 NOTE — Telephone Encounter (Signed)
 Please call pt's husband and advise him that she is due for a f/u appointment for her pain medications. Thanks.

## 2024-01-19 DIAGNOSIS — Z008 Encounter for other general examination: Secondary | ICD-10-CM | POA: Diagnosis not present

## 2024-01-19 MED ORDER — HYDROCODONE-ACETAMINOPHEN 5-325 MG PO TABS
1.0000 | ORAL_TABLET | Freq: Two times a day (BID) | ORAL | 0 refills | Status: DC | PRN
Start: 2024-01-19 — End: 2024-02-20

## 2024-01-27 ENCOUNTER — Other Ambulatory Visit: Payer: Self-pay | Admitting: Family Medicine

## 2024-02-20 ENCOUNTER — Encounter: Payer: Self-pay | Admitting: Family Medicine

## 2024-02-20 ENCOUNTER — Ambulatory Visit (INDEPENDENT_AMBULATORY_CARE_PROVIDER_SITE_OTHER): Admitting: Family Medicine

## 2024-02-20 VITALS — BP 121/75 | HR 114 | Ht 66.0 in | Wt 141.1 lb

## 2024-02-20 DIAGNOSIS — R232 Flushing: Secondary | ICD-10-CM

## 2024-02-20 DIAGNOSIS — G500 Trigeminal neuralgia: Secondary | ICD-10-CM | POA: Diagnosis not present

## 2024-02-20 DIAGNOSIS — M51369 Other intervertebral disc degeneration, lumbar region without mention of lumbar back pain or lower extremity pain: Secondary | ICD-10-CM | POA: Diagnosis not present

## 2024-02-20 DIAGNOSIS — M542 Cervicalgia: Secondary | ICD-10-CM

## 2024-02-20 DIAGNOSIS — G8929 Other chronic pain: Secondary | ICD-10-CM | POA: Diagnosis not present

## 2024-02-20 MED ORDER — HYDROCODONE-ACETAMINOPHEN 5-325 MG PO TABS: ORAL_TABLET | ORAL | 0 refills | Status: DC

## 2024-02-20 MED ORDER — HYDROCODONE-ACETAMINOPHEN 5-325 MG PO TABS: 1.0000 | ORAL_TABLET | Freq: Two times a day (BID) | ORAL | 0 refills | Status: DC | PRN

## 2024-02-20 MED ORDER — HYDROCODONE-ACETAMINOPHEN 5-325 MG PO TABS
1.0000 | ORAL_TABLET | Freq: Two times a day (BID) | ORAL | 0 refills | Status: AC | PRN
Start: 2024-02-20 — End: ?

## 2024-02-20 NOTE — Progress Notes (Signed)
 Established Patient Office Visit  Subjective  Patient ID: [name], female    DOB: 04/01/1946  Age: 78 y.o. MRN: [id number]  Chief Complaint  Patient presents with   Pain Management    HPI  Today for follow-up chronic pain management, 49-month follow-up.  She is doing well today.  Because of her memory issues her husband does dispense her medications to make sure that she is taking them safely he really only gives them to her if she says that she is having pain in her back.  He says some days he does not give it to her at all.  Typically 30-day supply lasts an extra 5 days.  Occasionally she does take it twice a day.  She is due for an updated pain contract today.  She also wanted to discuss that sometimes she feels really hot almost like a hot flash and sometimes she gets really cold.  Her husband says that she will feel hot so he will turn the fan on and then a little while show asked by the fans on.  Because then she starts to feel chilled.  Her husband went to know if there was anything we could do that might be helpful.    ROS    Objective:     BP 121/75   Pulse (!) 114   Ht 5' 6 (1.676 m)   Wt 141 lb 1.9 oz (64 kg)   SpO2 95%   BMI 22.78 kg/m    Physical Exam Vitals and nursing note reviewed.  Constitutional:      Appearance: Normal appearance.  HENT:     Head: Normocephalic and atraumatic.   Eyes:     Conjunctiva/sclera: Conjunctivae normal.    Cardiovascular:     Rate and Rhythm: Normal rate and regular rhythm.  Pulmonary:     Effort: Pulmonary effort is normal.     Breath sounds: Normal breath sounds.   Skin:    General: Skin is warm and dry.   Neurological:     Mental Status: She is alert.   Psychiatric:        Mood and Affect: Mood normal.      No results found for any visits on 02/20/24.    The 10-year ASCVD risk score (Arnett DK, et al., 2019) is: 23.5%    Assessment & Plan:   Problem List Items Addressed This Visit        Nervous and Auditory   NEURALGIA, TRIGEMINAL   Relevant Medications   HYDROcodone -acetaminophen  (NORCO/VICODIN) 5-325 MG tablet   HYDROcodone -acetaminophen  (NORCO/VICODIN) 5-325 MG tablet (Start on 03/21/2024)   HYDROcodone -acetaminophen  (NORCO/VICODIN) 5-325 MG tablet (Start on 04/21/2024)   Other Relevant Orders   Drug Screen, Ur (12+Oxycodone +Crt)     Musculoskeletal and Integument   Degenerative disc disease, lumbar   Relevant Medications   HYDROcodone -acetaminophen  (NORCO/VICODIN) 5-325 MG tablet   HYDROcodone -acetaminophen  (NORCO/VICODIN) 5-325 MG tablet (Start on 03/21/2024)   HYDROcodone -acetaminophen  (NORCO/VICODIN) 5-325 MG tablet (Start on 04/21/2024)   Other Relevant Orders   Drug Screen, Ur (12+Oxycodone +Crt)     Other   NECK PAIN   Relevant Medications   HYDROcodone -acetaminophen  (NORCO/VICODIN) 5-325 MG tablet   HYDROcodone -acetaminophen  (NORCO/VICODIN) 5-325 MG tablet (Start on 03/21/2024)   HYDROcodone -acetaminophen  (NORCO/VICODIN) 5-325 MG tablet (Start on 04/21/2024)   Other Relevant Orders   Drug Screen, Ur (12+Oxycodone +Crt)   Encounter for chronic pain management - Primary     Indication for chronic opioid: DDD Cervical and lumbar Medication and dose: Hydrocodone  5/325 #  pills per month: 60    Last UDS date: 02/20/24 Opioid Treatment Agreement signed (Y/N): Y, 02/20/24 NCCSRS reviewed this encounter (include red flags):  Yes   Follow up: 3 months.          Relevant Medications   HYDROcodone -acetaminophen  (NORCO/VICODIN) 5-325 MG tablet   HYDROcodone -acetaminophen  (NORCO/VICODIN) 5-325 MG tablet (Start on 03/21/2024)   HYDROcodone -acetaminophen  (NORCO/VICODIN) 5-325 MG tablet (Start on 04/21/2024)   Other Relevant Orders   Drug Screen, Ur (12+Oxycodone +Crt)   Other Visit Diagnoses       Hot flash not due to menopause           Hot flashes and cold sensation-we discussed that there is really nothing that we can do specifically to make this better except  for just wearing layers so it is easy to peel off a sweater if she gets hot or put her back on if she gets chilled running fans when needed.  Being that she is in her late 76s hormone therapy would not be a best option.  We did check her thyroid  recently in March and it looked perfect with a TSH of 1.6 so I do not think that is causing the temperature change sensations.  Return in about 3 months (around 05/22/2024) for pain management.    Dorothyann Byars, MD

## 2024-02-20 NOTE — Assessment & Plan Note (Addendum)
  Indication for chronic opioid: DDD Cervical and lumbar Medication and dose: Hydrocodone  5/325 # pills per month: 60    Last UDS date: 02/20/24 Opioid Treatment Agreement signed (Y/N): Y, 02/20/24 NCCSRS reviewed this encounter (include red flags):  Yes   Follow up: 3 months.

## 2024-02-21 LAB — DRUG SCREEN, UR (12+OXYCODONE+CRT)
Amphetamine Scrn, Ur: NEGATIVE ng/mL
BARBITURATE SCREEN URINE: NEGATIVE ng/mL
Cocaine (Metab) Scrn, Ur: NEGATIVE ng/mL

## 2024-02-23 LAB — DRUG SCREEN, UR (12+OXYCODONE+CRT)
BENZODIAZEPINE SCREEN, URINE: NEGATIVE ng/mL
CANNABINOIDS UR QL SCN: NEGATIVE ng/mL
Ph of Urine: 5.3 (ref 4.5–8.9)

## 2024-02-23 LAB — SPECIMEN STATUS REPORT

## 2024-03-25 ENCOUNTER — Other Ambulatory Visit: Payer: Self-pay | Admitting: Family Medicine

## 2024-03-25 DIAGNOSIS — G500 Trigeminal neuralgia: Secondary | ICD-10-CM

## 2024-03-25 DIAGNOSIS — M51369 Other intervertebral disc degeneration, lumbar region without mention of lumbar back pain or lower extremity pain: Secondary | ICD-10-CM

## 2024-03-25 DIAGNOSIS — G8929 Other chronic pain: Secondary | ICD-10-CM

## 2024-03-25 DIAGNOSIS — M542 Cervicalgia: Secondary | ICD-10-CM

## 2024-03-25 MED ORDER — HYDROCODONE-ACETAMINOPHEN 5-325 MG PO TABS: 1.0000 | ORAL_TABLET | Freq: Two times a day (BID) | ORAL | 0 refills | Status: DC | PRN

## 2024-03-25 NOTE — Telephone Encounter (Signed)
 Copied from CRM [id number]. Topic: Clinical - Medication Refill >> Mar 25, 2024  3:25 PM Laurier C wrote: Medication: HYDROcodone -acetaminophen  (NORCO/VICODIN) 5-325 MG tablet  Has the patient contacted their pharmacy? No Patient states he is always told to contact the provider 1st  This is the patient's preferred pharmacy:  [facility] [id number] - [place], [place] - [address] [place] [address] Phone: [phone] Fax: [phone]  Is this the correct pharmacy for this prescription? Yes If no, delete pharmacy and type the correct one.   Has the prescription been filled recently? No  Is the patient out of the medication? Yes  Has the patient been seen for an appointment in the last year OR does the patient have an upcoming appointment? Yes  Can we respond through MyChart? No  Agent: Please be advised that Rx refills may take up to 3 business days. We ask that you follow-up with your pharmacy.

## 2024-03-25 NOTE — Telephone Encounter (Signed)
 Please advise on refill request

## 2024-03-27 ENCOUNTER — Other Ambulatory Visit: Payer: Self-pay | Admitting: Family Medicine

## 2024-03-27 DIAGNOSIS — G3184 Mild cognitive impairment, so stated: Secondary | ICD-10-CM

## 2024-04-03 DIAGNOSIS — N3020 Other chronic cystitis without hematuria: Secondary | ICD-10-CM | POA: Diagnosis not present

## 2024-04-03 DIAGNOSIS — R350 Frequency of micturition: Secondary | ICD-10-CM | POA: Diagnosis not present

## 2024-04-05 ENCOUNTER — Other Ambulatory Visit: Payer: Self-pay | Admitting: Family Medicine

## 2024-04-25 ENCOUNTER — Other Ambulatory Visit: Payer: Self-pay | Admitting: Family Medicine

## 2024-04-25 DIAGNOSIS — M542 Cervicalgia: Secondary | ICD-10-CM

## 2024-04-25 DIAGNOSIS — G500 Trigeminal neuralgia: Secondary | ICD-10-CM

## 2024-04-25 DIAGNOSIS — M51369 Other intervertebral disc degeneration, lumbar region without mention of lumbar back pain or lower extremity pain: Secondary | ICD-10-CM

## 2024-04-25 DIAGNOSIS — G8929 Other chronic pain: Secondary | ICD-10-CM

## 2024-04-25 NOTE — Telephone Encounter (Unsigned)
 Copied from CRM #[id number]. Topic: Clinical - Medication Refill >> Apr 25, 2024 10:56 AM Susanna ORN wrote: Medication: HYDROcodone -acetaminophen  (NORCO/VICODIN) 5-325 MG tablet  Has the patient contacted their pharmacy? No (Agent: If no, request that the patient contact the pharmacy for the refill. If patient does not wish to contact the pharmacy document the reason why and proceed with request.) (Agent: If yes, when and what did the pharmacy advise?)  This is the patient's preferred pharmacy:  [facility] [id number] - [place], [place] - [address] [place] [address] Phone: [phone] Fax: [phone]  Is this the correct pharmacy for this prescription? Yes If no, delete pharmacy and type the correct one.   Has the prescription been filled recently? Yes  Is the patient out of the medication? Yes  Has the patient been seen for an appointment in the last year OR does the patient have an upcoming appointment? Yes  Can we respond through MyChart? No  Agent: Please be advised that Rx refills may take up to 3 business days. We ask that you follow-up with your pharmacy.

## 2024-04-26 NOTE — Telephone Encounter (Signed)
 Prescription already with the pharmacy.

## 2024-05-01 ENCOUNTER — Other Ambulatory Visit: Payer: Self-pay | Admitting: Family Medicine

## 2024-05-02 ENCOUNTER — Encounter: Payer: Self-pay | Admitting: Sports Medicine

## 2024-05-24 ENCOUNTER — Other Ambulatory Visit: Payer: Self-pay | Admitting: *Deleted

## 2024-05-24 DIAGNOSIS — E782 Mixed hyperlipidemia: Secondary | ICD-10-CM

## 2024-05-24 MED ORDER — ROSUVASTATIN CALCIUM 20 MG PO TABS: 20.0000 mg | ORAL_TABLET | Freq: Every day | ORAL | 3 refills | Status: DC

## 2024-06-25 ENCOUNTER — Other Ambulatory Visit: Payer: Self-pay

## 2024-06-25 DIAGNOSIS — M542 Cervicalgia: Secondary | ICD-10-CM

## 2024-06-25 DIAGNOSIS — G500 Trigeminal neuralgia: Secondary | ICD-10-CM

## 2024-06-25 DIAGNOSIS — M51369 Other intervertebral disc degeneration, lumbar region without mention of lumbar back pain or lower extremity pain: Secondary | ICD-10-CM

## 2024-06-25 DIAGNOSIS — G8929 Other chronic pain: Secondary | ICD-10-CM

## 2024-06-25 MED ORDER — HYDROCODONE-ACETAMINOPHEN 5-325 MG PO TABS: ORAL_TABLET | ORAL | 0 refills | Status: DC

## 2024-06-25 NOTE — Telephone Encounter (Signed)
 Requesting rx rf of  Hydrocodone - acet 5-325mg   Last written 04/21/2024 Last OV 02/20/2024 Upcoming appt = none

## 2024-06-25 NOTE — Telephone Encounter (Signed)
 Meds ordered this encounter  Medications   HYDROcodone -acetaminophen  (NORCO/VICODIN) 5-325 MG tablet    Sig: TAKE 0.5-1 TABLET EVERY TWELVE HOURS AS NEEDEDT    Dispense:  60 tablet    Refill:  0

## 2024-07-15 ENCOUNTER — Telehealth: Payer: Self-pay

## 2024-07-15 DIAGNOSIS — E039 Hypothyroidism, unspecified: Secondary | ICD-10-CM

## 2024-07-15 NOTE — Telephone Encounter (Signed)
 Copied from CRM #[id number]. Topic: Clinical - Medication Question >> Jul 15, 2024 10:57 AM Myrick T wrote: Reason for CRM: Delon from [facility] Prior stated there was a change in the manufacture for Levothyroxine  and they need a signed consent form sent to them before they would be able to fill the script. No form showing recvd, asked to refax and fax# was verified.

## 2024-07-15 NOTE — Telephone Encounter (Signed)
 [name], I do not see a form for this.  But they can switch brands and we will just need to recheck her TSH here in the office in 6 weeks.

## 2024-07-22 NOTE — Telephone Encounter (Signed)
 Pharmacy advised. Patient is aware to have labs in 6-8 weeks. Order placed.

## 2024-08-01 ENCOUNTER — Other Ambulatory Visit: Payer: Self-pay | Admitting: Family Medicine

## 2024-08-01 DIAGNOSIS — G500 Trigeminal neuralgia: Secondary | ICD-10-CM

## 2024-08-01 DIAGNOSIS — M51369 Other intervertebral disc degeneration, lumbar region without mention of lumbar back pain or lower extremity pain: Secondary | ICD-10-CM

## 2024-08-01 DIAGNOSIS — G8929 Other chronic pain: Secondary | ICD-10-CM

## 2024-08-01 DIAGNOSIS — M542 Cervicalgia: Secondary | ICD-10-CM

## 2024-08-01 NOTE — Telephone Encounter (Signed)
 Copied from CRM #[id number]. Topic: Clinical - Medication Refill >> Aug 01, 2024  4:11 PM Kevelyn M wrote: Medication: HYDROcodone -acetaminophen  (NORCO/VICODIN) 5-325 MG tablet   Has the patient contacted their pharmacy? Yes (Agent: If no, request that the patient contact the pharmacy for the refill. If patient does not wish to contact the pharmacy document the reason why and proceed with request.) (Agent: If yes, when and what did the pharmacy advise?)  This is the patient's preferred pharmacy:  [facility] [id number] - [place], [place] - [address] [place] [address] Phone: [phone] Fax: [phone]  Is this the correct pharmacy for this prescription? Yes If no, delete pharmacy and type the correct one.   Has the prescription been filled recently? Yes  Is the patient out of the medication? Yes  Has the patient been seen for an appointment in the last year OR does the patient have an upcoming appointment? Yes  Can we respond through MyChart? No  Agent: Please be advised that Rx refills may take up to 3 business days. We ask that you follow-up with your pharmacy.

## 2024-08-02 NOTE — Telephone Encounter (Signed)
 Please call pt's husband and and advise him that she is OVERDUE for her f/u appointment for pain management. Thank you

## 2024-08-09 ENCOUNTER — Other Ambulatory Visit: Payer: Self-pay

## 2024-08-09 DIAGNOSIS — M51369 Other intervertebral disc degeneration, lumbar region without mention of lumbar back pain or lower extremity pain: Secondary | ICD-10-CM

## 2024-08-09 DIAGNOSIS — G500 Trigeminal neuralgia: Secondary | ICD-10-CM

## 2024-08-09 DIAGNOSIS — M542 Cervicalgia: Secondary | ICD-10-CM

## 2024-08-09 DIAGNOSIS — G8929 Other chronic pain: Secondary | ICD-10-CM

## 2024-08-09 NOTE — Telephone Encounter (Signed)
 Copied from CRM #[id number]. Topic: Clinical - Medication Refill >> Aug 01, 2024  4:11 PM Kevelyn M wrote: Medication: HYDROcodone -acetaminophen  (NORCO/VICODIN) 5-325 MG tablet   Has the patient contacted their pharmacy? Yes (Agent: If no, request that the patient contact the pharmacy for the refill. If patient does not wish to contact the pharmacy document the reason why and proceed with request.) (Agent: If yes, when and what did the pharmacy advise?)  This is the patient's preferred pharmacy:  [facility] [id number] - [place], [place] - [address] [place] [address] Phone: [phone] Fax: [phone]  Is this the correct pharmacy for this prescription? Yes If no, delete pharmacy and type the correct one.   Has the prescription been filled recently? Yes  Is the patient out of the medication? Yes  Has the patient been seen for an appointment in the last year OR does the patient have an upcoming appointment? Yes  Can we respond through MyChart? No  Agent: Please be advised that Rx refills may take up to 3 business days. We ask that you follow-up with your pharmacy. >> Aug 09, 2024  2:49 PM Emylou G wrote: Husband call.. wondering if we will refill her meds?  She has an appt in January - would like a callback

## 2024-08-09 NOTE — Telephone Encounter (Signed)
 Message from patient husband wanting to know the pain medication can be refilled until her upcoming appt scheduled for 09/03/2024 with Dr. Alvan?  Requesting rx rf of hydrocodone  - acet 5-325mg  Last written 10/28/205 Last OV 02/20/2024 Upcoming appt 09/03/2024

## 2024-08-12 MED ORDER — HYDROCODONE-ACETAMINOPHEN 5-325 MG PO TABS: 1.0000 | ORAL_TABLET | Freq: Two times a day (BID) | ORAL | 0 refills | Status: DC | PRN

## 2024-08-12 NOTE — Telephone Encounter (Signed)
 Patient husband informed that medication was sent to pharmacy.

## 2024-08-14 DIAGNOSIS — R351 Nocturia: Secondary | ICD-10-CM | POA: Diagnosis not present

## 2024-08-14 DIAGNOSIS — N3281 Overactive bladder: Secondary | ICD-10-CM | POA: Diagnosis not present

## 2024-08-14 DIAGNOSIS — R350 Frequency of micturition: Secondary | ICD-10-CM | POA: Diagnosis not present

## 2024-08-23 ENCOUNTER — Other Ambulatory Visit: Payer: Self-pay | Admitting: Family Medicine

## 2024-09-03 ENCOUNTER — Ambulatory Visit (INDEPENDENT_AMBULATORY_CARE_PROVIDER_SITE_OTHER): Admitting: Family Medicine

## 2024-09-03 ENCOUNTER — Encounter: Payer: Self-pay | Admitting: Family Medicine

## 2024-09-03 VITALS — BP 132/80 | HR 82 | Ht 66.0 in | Wt 144.1 lb

## 2024-09-03 DIAGNOSIS — M51369 Other intervertebral disc degeneration, lumbar region without mention of lumbar back pain or lower extremity pain: Secondary | ICD-10-CM | POA: Diagnosis not present

## 2024-09-03 DIAGNOSIS — C50512 Malignant neoplasm of lower-outer quadrant of left female breast: Secondary | ICD-10-CM | POA: Diagnosis not present

## 2024-09-03 DIAGNOSIS — Z780 Asymptomatic menopausal state: Secondary | ICD-10-CM

## 2024-09-03 DIAGNOSIS — C771 Secondary and unspecified malignant neoplasm of intrathoracic lymph nodes: Secondary | ICD-10-CM

## 2024-09-03 DIAGNOSIS — G8929 Other chronic pain: Secondary | ICD-10-CM

## 2024-09-03 DIAGNOSIS — F02C0 Dementia in other diseases classified elsewhere, severe, without behavioral disturbance, psychotic disturbance, mood disturbance, and anxiety: Secondary | ICD-10-CM | POA: Diagnosis not present

## 2024-09-03 DIAGNOSIS — M542 Cervicalgia: Secondary | ICD-10-CM | POA: Diagnosis not present

## 2024-09-03 DIAGNOSIS — Z170 Estrogen receptor positive status [ER+]: Secondary | ICD-10-CM

## 2024-09-03 DIAGNOSIS — Z1231 Encounter for screening mammogram for malignant neoplasm of breast: Secondary | ICD-10-CM

## 2024-09-03 DIAGNOSIS — G301 Alzheimer's disease with late onset: Secondary | ICD-10-CM

## 2024-09-03 MED ORDER — HYDROCODONE-ACETAMINOPHEN 5-325 MG PO TABS: 1.0000 | ORAL_TABLET | Freq: Two times a day (BID) | ORAL | 0 refills | Status: AC | PRN

## 2024-09-03 MED ORDER — HYDROCODONE-ACETAMINOPHEN 5-325 MG PO TABS: ORAL_TABLET | ORAL | 0 refills | Status: AC

## 2024-09-03 NOTE — Progress Notes (Signed)
 "  Established Patient Office Visit  Patient ID: [name], female    DOB: 11-20-1945  Age: 79 y.o. MRN: [id number] PCP: Alvan Dorothyann BIRCH, MD  No chief complaint on file.   Subjective:     HPI  Discussed the use of AI scribe software for clinical note transcription with the patient, who gave verbal consent to proceed.  History of Present Illness [name] is a 79 year old female with advancing dementia who presents for a follow-up visit regarding medication management and sleep issues.  Cognitive impairment - Advancing dementia - Currently taking Aricept  ODT 10 mg and Namenda  5mg  for memory support  Sleep disturbance - Significant difficulty initiating sleep, unable to fall asleep until 6 AM after going to bed at 11:30 PM - Describes mind as 'just a rolling' during the night - Hydroxyzine ineffective for sleep - Seeking alternative sleep aid that does not cause grogginess  Pruritus and skin care - Pruritus primarily on arms, often scratches unconsciously - Uses Eucerin healing cream (dye-free, perfume-free) for skin moisturization and irritation prevention  Chronic pain management - Chronic pain managed with hydrocodone -acetaminophen  5/325 mg - Current regimen remains effective  Lower urinary tract symptoms - Bladder symptoms ongoing for approximately one week - One severe episode with approximately twenty episodes of urination in one night, resulting in sleep disruption - Currently taking solifenacin for bladder symptoms - Tea consumption may exacerbate urinary frequency - History of low water intake, prefers tea or coffee  Appetite and general symptoms - Good appetite - No chest pain - No shortness of breath  Neuropathic pain history - No facial nerve pain for over a year - Carbamazepine  discontinued for more than one year     ROS    Objective:     BP 132/80   Pulse 82   Ht 5' 6 (1.676 m)   Wt 144 lb 1.9 oz (65.4 kg)   SpO2 97%   BMI  23.26 kg/m    Physical Exam Vitals and nursing note reviewed.  Constitutional:      Appearance: Normal appearance.  HENT:     Head: Normocephalic and atraumatic.  Eyes:     Conjunctiva/sclera: Conjunctivae normal.  Cardiovascular:     Rate and Rhythm: Normal rate and regular rhythm.  Pulmonary:     Effort: Pulmonary effort is normal.     Breath sounds: Normal breath sounds.  Skin:    General: Skin is warm and dry.  Neurological:     Mental Status: She is alert.  Psychiatric:        Mood and Affect: Mood normal.      No results found for any visits on 09/03/24.    The 10-year ASCVD risk score (Arnett DK, et al., 2019) is: 27.3%    Assessment & Plan:   Problem List Items Addressed This Visit       Nervous and Auditory   Alzheimer dementia (HCC) - Primary   Alzheimer's disease Managed with Aricept  ODT 10 mg. Interest in exploring newer treatment options and consulting a neurologist for memory management. - Consider referral to a neurologist for evaluation of memory management options.      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 Follows with Dr. Girard. Due for mammo in april      Relevant Orders   CMP14+EGFR   Lipid panel   CBC   Encounter for chronic pain management     Indication for chronic opioid: DDD Cervical and lumbar Medication and dose: Hydrocodone  5/325 # pills per month: 60    Last UDS date: 09/03/24 Opioid Treatment Agreement signed (Y/N): Y, 02/20/24 [facility] reviewed this encounter (include red flags):  Yes   Follow up: 4 months.          Relevant Medications   HYDROcodone -acetaminophen  (NORCO/VICODIN) 5-325 MG tablet (Start on 09/12/2024)   HYDROcodone -acetaminophen  (NORCO/VICODIN) 5-325 MG tablet (Start on 10/13/2024)   HYDROcodone -acetaminophen  (NORCO/VICODIN) 5-325 MG tablet (Start on 11/10/2024)   Other Relevant Orders   Drug Screen, Ur (12+Oxycodone +Crt)   CMP14+EGFR   Lipid panel   CBC   Other Visit Diagnoses       Screening mammogram for breast cancer       Relevant Orders   MM 3D SCREENING MAMMOGRAM BILATERAL BREAST     Post-menopausal       Relevant Orders   DG Bone Density       Assessment and Plan Assessment & Plan Chronic pain management Chronic pain managed with hydrocodone -acetaminophen  5-325 mg as needed. Medication effective but not completely pain-free. Husband dispenses medication due to advancing dementia. - Updated pain contract and ensured pharmacy information is current. - Refilled hydrocodone -acetaminophen  5-325 mg for three months.    Overactive bladder Managed with solifenacin. Reports improvement but frequent urination persists, especially after caffeinated tea. Tea identified as a bladder irritant. - Continue solifenacin for bladder management. - Advised reducing intake of caffeinated tea.  Insomnia Persists despite hydroxyzine, which is  ineffective. Desires non-sedating option to avoid grogginess and ensure safety during nighttime activities. - Explore alternative sleep aids that do not cause grogginess.  Pruritus Primarily affects arms, with no significant itching reported. Scratches unconsciously, risking skin damage. Eucerin healing cream recommended for moisturizing and preventing irritation. - Apply Eucerin healing cream to moisturize and prevent irritation. - Wear longer sleeves to prevent skin damage from scratching. - Consider using Band-Aids or half-gloves to cover affected areas.  General health maintenance Routine health maintenance due, including blood work, bone density, and mammogram screenings. Calcium  supplementation recommended for bone health. - Ordered blood work today. - Scheduled bone density and mammogram screenings for April. - Recommended calcium  supplementation with vitamin D3.    Return in about 3 months (around 12/02/2024) for Chronic Pain Mgt.    Dorothyann Byars, MD [facility] at [facility]   "

## 2024-09-03 NOTE — Assessment & Plan Note (Signed)
 On tamoxifen . Follows with Dr. Girard. Due for mammo in april

## 2024-09-03 NOTE — Assessment & Plan Note (Signed)
"  °  Indication for chronic opioid: DDD Cervical and lumbar Medication and dose: Hydrocodone  5/325 # pills per month: 60    Last UDS date: 09/03/24 Opioid Treatment Agreement signed (Y/N): Y, 02/20/24 [facility] reviewed this encounter (include red flags):  Yes   Follow up: 4 months.     "

## 2024-09-03 NOTE — Assessment & Plan Note (Signed)
 Alzheimer's disease Managed with Aricept  ODT 10 mg. Interest in exploring newer treatment options and consulting a neurologist for memory management. - Consider referral to a neurologist for evaluation of memory management options.

## 2024-09-04 ENCOUNTER — Ambulatory Visit: Payer: Self-pay | Admitting: Family Medicine

## 2024-09-04 LAB — CMP14+EGFR
ALT: 18 IU/L (ref 0–32)
AST: 22 IU/L (ref 0–40)
Albumin: 4.4 g/dL (ref 3.8–4.8)
Alkaline Phosphatase: 62 IU/L (ref 49–135)
BUN/Creatinine Ratio: 23 (ref 12–28)
BUN: 25 mg/dL (ref 8–27)
Bilirubin Total: 0.3 mg/dL (ref 0.0–1.2)
CO2: 24 mmol/L (ref 20–29)
Calcium: 9.6 mg/dL (ref 8.7–10.3)
Chloride: 104 mmol/L (ref 96–106)
Creatinine, Ser: 1.09 mg/dL — ABNORMAL HIGH (ref 0.57–1.00)
Globulin, Total: 2 g/dL (ref 1.5–4.5)
Glucose: 91 mg/dL (ref 70–99)
Potassium: 4.5 mmol/L (ref 3.5–5.2)
Sodium: 141 mmol/L (ref 134–144)
Total Protein: 6.4 g/dL (ref 6.0–8.5)
eGFR: 52 mL/min/1.73 — ABNORMAL LOW

## 2024-09-04 LAB — CBC
Hematocrit: 43 % (ref 34.0–46.6)
Hemoglobin: 13.9 g/dL (ref 11.1–15.9)
MCH: 31.5 pg (ref 26.6–33.0)
MCHC: 32.3 g/dL (ref 31.5–35.7)
MCV: 98 fL — ABNORMAL HIGH (ref 79–97)
Platelets: 222 x10E3/uL (ref 150–450)
RBC: 4.41 x10E6/uL (ref 3.77–5.28)
RDW: 12.6 % (ref 11.7–15.4)
WBC: 5.7 x10E3/uL (ref 3.4–10.8)

## 2024-09-04 LAB — LIPID PANEL
Chol/HDL Ratio: 2.7 ratio (ref 0.0–4.4)
Cholesterol, Total: 170 mg/dL (ref 100–199)
HDL: 62 mg/dL
LDL Chol Calc (NIH): 69 mg/dL (ref 0–99)
Triglycerides: 242 mg/dL — ABNORMAL HIGH (ref 0–149)
VLDL Cholesterol Cal: 39 mg/dL (ref 5–40)

## 2024-09-04 NOTE — Progress Notes (Signed)
 Call Octavie's husband and let him know that her kidney function is stable liver enzymes are normal.  Triglycerides are slightly elevated but overall cholesterol looks good.  Blood count is normal no sign of anemia.

## 2024-09-11 LAB — DRUG SCREEN, UR (12+OXYCODONE+CRT)
Amphetamine Scrn, Ur: NEGATIVE ng/mL
BARBITURATE SCREEN URINE: NEGATIVE ng/mL
BENZODIAZEPINE SCREEN, URINE: NEGATIVE ng/mL
CANNABINOIDS UR QL SCN: NEGATIVE ng/mL
Cocaine (Metab) Scrn, Ur: NEGATIVE ng/mL
Creatinine(Crt), U: 144.6 mg/dL (ref 20.0–300.0)
Fentanyl, Urine: NEGATIVE
Meperidine Screen, Urine: NEGATIVE ng/mL
Methadone Screen, Urine: NEGATIVE ng/mL
OXYCODONE+OXYMORPHONE UR QL SCN: NEGATIVE ng/mL
Opiate Scrn, Ur: POSITIVE ng/mL — AB
Ph of Urine: 5.4 (ref 4.5–8.9)
Phencyclidine Qn, Ur: NEGATIVE ng/mL
Propoxyphene Scrn, Ur: NEGATIVE ng/mL
SPECIFIC GRAVITY: 1.016
Tramadol Screen, Urine: NEGATIVE ng/mL

## 2024-09-28 ENCOUNTER — Other Ambulatory Visit: Payer: Self-pay | Admitting: Family Medicine

## 2024-09-28 DIAGNOSIS — G3184 Mild cognitive impairment, so stated: Secondary | ICD-10-CM

## 2024-10-16 ENCOUNTER — Other Ambulatory Visit

## 2024-12-03 ENCOUNTER — Ambulatory Visit: Admitting: Family Medicine
